# Patient Record
Sex: Male | Born: 1965
Health system: Southern US, Community
[De-identification: ages and names within clinical notes are randomized; demographics above are authoritative.]

## PROBLEM LIST (undated history)

## (undated) DIAGNOSIS — E119 Type 2 diabetes mellitus without complications: Secondary | ICD-10-CM

## (undated) DIAGNOSIS — K219 Gastro-esophageal reflux disease without esophagitis: Secondary | ICD-10-CM

## (undated) DIAGNOSIS — A4902 Methicillin resistant Staphylococcus aureus infection, unspecified site: Secondary | ICD-10-CM

## (undated) DIAGNOSIS — I10 Essential (primary) hypertension: Secondary | ICD-10-CM

## (undated) DIAGNOSIS — Z87898 Personal history of other specified conditions: Secondary | ICD-10-CM

## (undated) HISTORY — PX: BACK SURGERY: SHX140

## (undated) HISTORY — PX: LAPAROSCOPIC GASTRIC BANDING: SHX1100

## (undated) HISTORY — DX: Personal history of other specified conditions: Z87.898

---

## 2015-08-31 ENCOUNTER — Emergency Department (HOSPITAL_COMMUNITY)
Admission: EM | Admit: 2015-08-31 | Discharge: 2015-08-31 | Disposition: A | Discharge status: Home / Self Care | Payer: Self-pay | Attending: Emergency Medicine | Admitting: Emergency Medicine

## 2015-08-31 ENCOUNTER — Encounter (HOSPITAL_COMMUNITY): Payer: Self-pay | Admitting: *Deleted

## 2015-08-31 DIAGNOSIS — K029 Dental caries, unspecified: Secondary | ICD-10-CM | POA: Insufficient documentation

## 2015-08-31 DIAGNOSIS — K0889 Other specified disorders of teeth and supporting structures: Secondary | ICD-10-CM | POA: Insufficient documentation

## 2015-08-31 DIAGNOSIS — K002 Abnormalities of size and form of teeth: Secondary | ICD-10-CM | POA: Insufficient documentation

## 2015-08-31 HISTORY — DX: Essential (primary) hypertension: I10

## 2015-08-31 MED ORDER — PENICILLIN V POTASSIUM 500 MG PO TABS: 500.0000 mg | ORAL_TABLET | Freq: Four times a day (QID) | ORAL | Status: DC

## 2015-08-31 MED ORDER — IBUPROFEN 800 MG PO TABS: 800.0000 mg | ORAL_TABLET | Freq: Three times a day (TID) | ORAL | Status: DC | PRN

## 2015-08-31 MED ORDER — HYDROCODONE-ACETAMINOPHEN 5-325 MG PO TABS: 1.0000 | ORAL_TABLET | Freq: Four times a day (QID) | ORAL | Status: DC | PRN

## 2015-08-31 NOTE — ED Notes (Signed)
Pt awaiting information from Case Manager for further assistance with resources.

## 2015-08-31 NOTE — ED Notes (Signed)
Pt c/o tooth pain stating "it's bothered me a long time"

## 2015-08-31 NOTE — Progress Notes (Signed)
EDCM spoke to patient at bedside. Patient confirms she does not have a pcp or insurance [place].  [facility], informed patient of services there.  EDCM also provided patient with list of pcps who accept self pay patients, list of discount pharmacies and websites needymeds.org and GoodRX.com for medication assistance, phone number to inquire about the orange card, phone number to inquire about Mediciad, phone number to inquire about the Affordable Care Act, financial resources in the community such as local churches, salvation army, urban ministries, and dental assistance for uninsured patients.  Patient thankful for resources.  No further EDCM needs at this time.  Patient agreeable to [facility] referral for orange card.  P4CC referral placed for orange card.

## 2015-08-31 NOTE — ED Notes (Addendum)
Pt provided information for [facility] for b/p f/uas printed by Dagoberto Ligashris, PA-C.

## 2015-08-31 NOTE — Discharge Instructions (Signed)
Return here as needed. Follow up with the dentist provided. °

## 2015-08-31 NOTE — ED Provider Notes (Signed)
CSN: [id number]     Arrival date & time 08/31/15  1959 History  By signing my name below, I, [name], attest that this documentation has been prepared under the direction and in the presence of Ebbie Ridgehris Khadeem Rockett, [employer]PA-C Electronically Signed: [name], ED Scribe. 08/31/2015. 8:31 PM.   Chief Complaint  Patient presents with  . Dental Pain      The history is provided by the patient. No language interpreter was used.    Kevin [name] is a 49 y.o. male who presents to the Emergency Department complaining of right upper dental pain onset 2 days ago worsening last night. He notes that the tooth is primarily gone and that he didn't get any sleep last night. He denies having a dentist at this time. He states that has not tried any medications for the relief for his symptoms. He denies fever, chills, facial swelling, and any other symptoms.    No past medical history on file. No past surgical history on file. No family history on file. Social History  Substance Use Topics  . Smoking status: Not on file  . Smokeless tobacco: Not on file  . Alcohol Use: Not on file    Review of Systems  A complete 10 system review of systems was obtained and all systems are negative except as noted in the HPI and PMH.   Allergies  Review of patient's allergies indicates not on file.  Home Medications   Prior to Admission medications   Not on File   BP 176/118 mmHg  Pulse 123  Temp(Src) 98.7 F (37.1 C) (Oral)  Resp 20  SpO2 95% Physical Exam  Constitutional: He is oriented to person, place, and time. He appears well-developed and well-nourished. No distress.  HENT:  Head: Normocephalic and atraumatic.  Mouth/Throat: Oropharynx is clear and moist and mucous membranes are normal. Abnormal dentition.  severely decayed canine on the right upper with inflammation around the gumline and pain.   Eyes: EOM are normal.  Neck: Neck supple.  Cardiovascular: Normal rate.   Pulmonary/Chest: Effort  normal. No respiratory distress.  Musculoskeletal: Normal range of motion.  Neurological: He is alert and oriented to person, place, and time.  Skin: Skin is warm and dry.  Psychiatric: He has a normal mood and affect. His behavior is normal.  Nursing note and vitals reviewed.   ED Course  Procedures (including critical care time) DIAGNOSTIC STUDIES: Oxygen Saturation is 95% on RA, adequate by my interpretation.    COORDINATION OF CARE: 8:29 PM Discussed treatment plan with pt at bedside which includes abx Rx and referral and f/u with dentist and pt agreed to plan.    I personally performed the services described in this documentation, which was scribed in my presence. The recorded information has been reviewed and is accurate.    Charlestine NightChristopher Aland Chestnutt, PA-C 09/02/15 0551  Laurence Spatesachel Morgan Little, MD 09/07/15 [phone]

## 2015-11-11 ENCOUNTER — Emergency Department (HOSPITAL_COMMUNITY)
Admission: EM | Admit: 2015-11-11 | Discharge: 2015-11-11 | Disposition: A | Discharge status: Home / Self Care | Payer: Self-pay | Attending: Emergency Medicine | Admitting: Emergency Medicine

## 2015-11-11 ENCOUNTER — Encounter (HOSPITAL_COMMUNITY): Payer: Self-pay | Admitting: *Deleted

## 2015-11-11 DIAGNOSIS — Z87891 Personal history of nicotine dependence: Secondary | ICD-10-CM | POA: Insufficient documentation

## 2015-11-11 DIAGNOSIS — K047 Periapical abscess without sinus: Secondary | ICD-10-CM | POA: Insufficient documentation

## 2015-11-11 DIAGNOSIS — I10 Essential (primary) hypertension: Secondary | ICD-10-CM | POA: Insufficient documentation

## 2015-11-11 DIAGNOSIS — K029 Dental caries, unspecified: Secondary | ICD-10-CM | POA: Insufficient documentation

## 2015-11-11 MED ORDER — TRAMADOL HCL 50 MG PO TABS: 50.0000 mg | ORAL_TABLET | Freq: Four times a day (QID) | ORAL | Status: DC | PRN

## 2015-11-11 MED ORDER — PENICILLIN V POTASSIUM 500 MG PO TABS: 500.0000 mg | ORAL_TABLET | Freq: Four times a day (QID) | ORAL | Status: DC

## 2015-11-11 MED ORDER — IBUPROFEN 800 MG PO TABS: 800.0000 mg | ORAL_TABLET | Freq: Three times a day (TID) | ORAL | Status: DC | PRN

## 2015-11-11 NOTE — ED Notes (Signed)
?  Abscess upper rt front of mouth on gum line. Seen in Dec for same, antibiotics helped at that time.

## 2015-11-11 NOTE — ED Provider Notes (Signed)
CSN: [id number]     Arrival date & time 11/11/15  1191 History   First MD Initiated Contact with Patient 11/11/15 [phone]     Chief Complaint  Patient presents with  . Abscess     (Consider location/radiation/quality/duration/timing/severity/associated sxs/prior Treatment) HPI Patient presents to the emergency department with right upper dental pain.  The patient states that he was here in December for similar symptoms.  He states that the pain was improved and gotten better over time after the antibiotics.  He states that he did not follow up with dentistry.  The patient states that nothing seems to make his condition, better or worse.  Patient has severe decay of his teeth.  The patient states he does not have a dentist.  The patient denies chest pain, shortness of breath, weakness, dizziness, headache, blurred vision, back pain, neck swelling, tongue swelling, or syncope.  The patient states that seems make the condition better.  Palpation of the area makes the pain worse Past Medical History  Diagnosis Date  . Hypertension    Past Surgical History  Procedure Laterality Date  . Back surgery     No family history on file. Social History  Substance Use Topics  . Smoking status: Former Games developer  . Smokeless tobacco: None  . Alcohol Use: No    Review of Systems  All other systems negative except as documented in the HPI. All pertinent positives and negatives as reviewed in the HPI.  Allergies  Review of patient's allergies indicates no known allergies.  Home Medications   Prior to Admission medications   Medication Sig Start Date End Date Taking? Authorizing Provider  HYDROcodone-acetaminophen (NORCO/VICODIN) 5-325 MG tablet Take 1 tablet by mouth every 6 (six) hours as needed for moderate pain. Patient not taking: Reported on 11/11/2015 08/31/15   Charlestine Night, PA-C  ibuprofen (ADVIL,MOTRIN) 800 MG tablet Take 1 tablet (800 mg total) by mouth every 8 (eight) hours as  needed. Patient not taking: Reported on 11/11/2015 08/31/15   Charlestine Night, PA-C  penicillin v potassium (VEETID) 500 MG tablet Take 1 tablet (500 mg total) by mouth 4 (four) times daily. Patient not taking: Reported on 11/11/2015 08/31/15   Charlestine Night, PA-C   BP 169/116 mmHg  Pulse 98  Temp(Src) 98.4 F (36.9 C) (Oral)  Resp 18  SpO2 99% Physical Exam  Constitutional: He is oriented to person, place, and time. He appears well-developed and well-nourished.  HENT:  Head: Normocephalic and atraumatic.  Mouth/Throat: Uvula is midline, oropharynx is clear and moist and mucous membranes are normal.    Eyes: Pupils are equal, round, and reactive to light.  Cardiovascular: Normal rate, regular rhythm and normal heart sounds.  Exam reveals no gallop and no friction rub.   No murmur heard. Pulmonary/Chest: Effort normal and breath sounds normal. No respiratory distress.  Neurological: He is alert and oriented to person, place, and time. He exhibits normal muscle tone. Coordination normal.  Skin: Skin is warm and dry. No rash noted. No erythema.  Psychiatric: He has a normal mood and affect. His behavior is normal.  Nursing note and vitals reviewed.   ED Course  Procedures (including critical care time) Labs Review Labs Reviewed - No data to display  Imaging Review No results found. I have personally reviewed and evaluated these images and lab results as part of my medical decision-making.   Patient be given follow-up with oral surgery due to the fact that he has severe decay    Charlestine Night,  PA-C 11/11/15 [phone]  Raeford RazorStephen Kohut, MD 11/13/15 [phone]

## 2015-11-11 NOTE — Discharge Instructions (Signed)
There was no dentist on call, however, did refer you to an oral surgeon.  There are also resources below for dental care and primary care follow-up  [employer] Resource Guide Dental The United Ways 211 is a great source of information about community services available.  Access by dialing 2-1-1 from anywhere in [place], or by website -  PooledIncome.plwww.nc211.org.   Other Local Resources (Updated 09/2015)  Dental  Care   Services    Phone Number and Address  Cost  [facility] For children 590 - 50 years of age:   Cleaning  Tooth brushing/flossing instruction  Sealants, fillings, crowns  Extractions  Emergency treatment  [phone] [address] [place], [place] [address] Charges based on family income.  Medicaid and some insurance plans accepted.     [place] [facility]  Cleaning  Sealants, fillings, crowns  Extractions  Emergency treatment [phone] [address] [place], [place]  Pregnant women 50 years of age or older with a Medicaid card  [place] [facility] [place]  Cleaning  Sealants, fillings, crowns  Extractions  Emergency treatment [phone] [address] [place], [place] Pregnant women 50 years of age or older with a Medicaid card  [facility] For children 920 - 50 years of age:   Cleaning  Tooth brushing/flossing instruction  Sealants, fillings, crowns  Extractions  Emergency treatment Limited orthodontic services for patients with Medicaid [phone] [address] [place], [place] [address] Medicaid and [facility] Health Choice cover for children up to age 50 and pregnant women.  Parents of children up to age 50 without Medicaid pay a reduced fee at time of service.  [facility] [place] For children 720 - 50 years of age:   Cleaning  Tooth  brushing/flossing instruction  Sealants, fillings, crowns  Extractions  Emergency treatment Limited orthodontic services for patients with Medicaid [phone] [address] [place], [place].  Medicaid and [place] Health Choice cover for children up to age 50 and pregnant women.  Parents of children up to age 50 without Medicaid pay a reduced fee.  [facility]  Cleaning  Sealants, fillings, crowns  Extractions  Hours: Tuesdays and Thursdays, 4:15 - 8 pm [phone] [address] [place], [place] [address] Services free of charge to [facility] residents ages 18-64 who do not have health insurance, Medicare, [place], or [place] benefits and fall within federal poverty guidelines  [facility]    Provides dental care in addition to primary medical care, nutritional counseling, and pharmacy:  Nurse, mental healthCleaning  Sealants, fillings, crowns  Extractions                  [phone] [facility], [address] [place], [place]  [phone] [facility], [address] [place], [place]  [phone] [facility] [place], [place]  [phone] [facility], [address] [place], [place]  [phone] [facility] [address] [place], [place] Accepts [place], [place]are, most insurance.  Also provides services available to all with fees adjusted based on ability to pay.    [facility]  Cleaning  Tooth brushing/flossing instruction  Sealants, fillings, crowns  Extractions  Emergency treatment Hours: Tuesdays, Thursdays, and Fridays from 8 am to 5 pm by  appointment only. [phone] [address] [place], [place] [address] [facility] residents with Medicaid (depending on eligibility) and children with [facility] Health Choice - call for more information.  [facility]  Extractions only  Hours: 2nd and 4th Thursday of each month from 6:30 am - 9 am.   [phone]. [phone] [place], [place] [address] Ages 39 and older only.  Patients are seen on a first come, first served basis.  [employer]  [employer]  Extractions  Orthodontics  Endodontics  Implants/Crowns/Bridges  Complete and partial dentures [phone] [place], [place] Patients must complete an application for services.  There is often a waiting list.

## 2016-04-10 ENCOUNTER — Emergency Department (HOSPITAL_COMMUNITY)
Admission: EM | Admit: 2016-04-10 | Discharge: 2016-04-10 | Disposition: A | Discharge status: Home / Self Care | Payer: Self-pay | Attending: Emergency Medicine | Admitting: Emergency Medicine

## 2016-04-10 ENCOUNTER — Encounter (HOSPITAL_COMMUNITY): Payer: Self-pay | Admitting: Emergency Medicine

## 2016-04-10 DIAGNOSIS — L02212 Cutaneous abscess of back [any part, except buttock]: Secondary | ICD-10-CM | POA: Insufficient documentation

## 2016-04-10 DIAGNOSIS — I10 Essential (primary) hypertension: Secondary | ICD-10-CM | POA: Insufficient documentation

## 2016-04-10 DIAGNOSIS — L0291 Cutaneous abscess, unspecified: Secondary | ICD-10-CM

## 2016-04-10 DIAGNOSIS — Z87891 Personal history of nicotine dependence: Secondary | ICD-10-CM | POA: Insufficient documentation

## 2016-04-10 HISTORY — DX: Methicillin resistant Staphylococcus aureus infection, unspecified site: A49.02

## 2016-04-10 MED ORDER — SULFAMETHOXAZOLE-TRIMETHOPRIM 800-160 MG PO TABS: 1.0000 | ORAL_TABLET | Freq: Two times a day (BID) | ORAL | 0 refills | Status: AC

## 2016-04-10 MED ORDER — LIDOCAINE-EPINEPHRINE 2 %-1:100000 IJ SOLN
20.0000 mL | Freq: Once | INTRAMUSCULAR | Status: AC
  Administered 2016-04-10: 1 mL
  Filled 2016-04-10: qty 1

## 2016-04-10 NOTE — ED Notes (Signed)
Pt's blood pressure is elevated but pt states that is not bad for him  Pt states he has hypertension but is not on any medication for it

## 2016-04-10 NOTE — ED Provider Notes (Signed)
[facility]-EMERGENCY DEPT Provider Note   CSN: [id number] Arrival date & time: 04/10/16  8657  First Provider Contact:  First MD Initiated Contact with Patient 04/10/16 [phone]        History   Chief Complaint Chief Complaint  Patient presents with  . Abscess    HPI Kevin [name] is a 50 y.o. male.    Patient presents to the emergency department with complaints of a small abscess to his right low back which he noted yesterday.  He states the area is red and hot and tender to touch.  He states he's had multiple abscesses before.  No fevers or chills.  No other complaints.    The history is provided by the patient and medical records.    Past Medical History:  Diagnosis Date  . Hypertension   . MRSA (methicillin resistant Staphylococcus aureus)     There are no active problems to display for this patient.   Past Surgical History:  Procedure Laterality Date  . BACK SURGERY    . LAPAROSCOPIC GASTRIC BANDING         Home Medications    Prior to Admission medications   Medication Sig Start Date End Date Taking? Authorizing Provider  sulfamethoxazole-trimethoprim (BACTRIM DS,SEPTRA DS) 800-160 MG tablet Take 1 tablet by mouth 2 (two) times daily. 04/10/16 04/17/16  Azalia Bilis, MD    Family History History reviewed. No pertinent family history.  Social History Social History  Substance Use Topics  . Smoking status: Former Games developer  . Smokeless tobacco: Never Used  . Alcohol use No     Allergies   Review of patient's allergies indicates no known allergies.   Review of Systems Review of Systems  All other systems reviewed and are negative.    Physical Exam Updated Vital Signs BP (!) 192/120 (BP Location: Left Arm)   Pulse 111   Temp 98 F (36.7 C) (Oral)   Resp 20   Ht 5\' 7"  (1.702 m)   Wt 180 lb (81.6 kg)   SpO2 100%   BMI 28.19 kg/m   Physical Exam  Constitutional: He is oriented to person, place, and time. He appears well-developed and well-nourished.   HENT:  Head: Normocephalic.  Eyes: EOM are normal.  Neck: Normal range of motion.  Pulmonary/Chest: Effort normal.  Abdominal: He exhibits no distension.  Musculoskeletal: Normal range of motion.  Small focal abscess to right low back without significant spreading erythema.  No drainage.  Small fluctuance.  Neurological: He is alert and oriented to person, place, and time.  Psychiatric: He has a normal mood and affect.  Nursing note and vitals reviewed.    ED Treatments / Results  Labs (all labs ordered are listed, but only abnormal results are displayed) Labs Reviewed - No data to display  EKG  EKG Interpretation None       Radiology No results found.  Procedures Procedures (including critical care time)   ++++++++++++++++++++++++++++++++++++++++++++++++++++++  INCISION AND DRAINAGE Performed by: Lyanne Co Consent: Verbal consent obtained. Risks and benefits: risks, benefits and alternatives were discussed Time out performed prior to procedure Type: abscess Body area: Right low back Anesthesia: local infiltration Incision was made with a scalpel. Local anesthetic: lidocaine 2 % with  epinephrine Anesthetic total: 3 ml Complexity: complex Blunt dissection to break up loculations Drainage: purulent Drainage amount: Small  Packing material: None  Patient tolerance: Patient tolerated the procedure well with no immediate complications.     Medications Ordered in ED Medications  lidocaine-EPINEPHrine (XYLOCAINE  W/EPI) 2 %-1:100000 (with pres) injection 20 mL (not administered)     Initial Impression / Assessment and Plan / ED Course  I have reviewed the triage vital signs and the nursing notes.  Pertinent labs & imaging results that were available during my care of the patient were reviewed by me and considered in my medical decision making (see chart for details).  Clinical Course    Summary abscess.  I and D.  Bactrim.  Patient understands  return the ER for new or worsening symptoms  Final Clinical Impressions(s) / ED Diagnoses   Final diagnoses:  Abscess    New Prescriptions New Prescriptions   SULFAMETHOXAZOLE-TRIMETHOPRIM (BACTRIM DS,SEPTRA DS) 800-160 MG TABLET    Take 1 tablet by mouth 2 (two) times daily.     Azalia Bilis, MD 04/10/16 [phone]

## 2016-04-10 NOTE — ED Notes (Signed)
Pt left the department by self with no medication reaction noted.

## 2016-04-10 NOTE — ED Triage Notes (Signed)
Pt has an abscess on his lower back on the right side  Pt states he has MRSA and has had many of these in the past  Pt states is started yesterday  Area is red and hot to touch

## 2016-05-13 ENCOUNTER — Encounter (HOSPITAL_COMMUNITY): Payer: Self-pay | Admitting: Emergency Medicine

## 2016-05-13 ENCOUNTER — Emergency Department (HOSPITAL_COMMUNITY)
Admission: EM | Admit: 2016-05-13 | Discharge: 2016-05-13 | Disposition: A | Discharge status: Home / Self Care | Payer: Self-pay | Attending: Emergency Medicine | Admitting: Emergency Medicine

## 2016-05-13 DIAGNOSIS — L02416 Cutaneous abscess of left lower limb: Secondary | ICD-10-CM | POA: Insufficient documentation

## 2016-05-13 DIAGNOSIS — L0291 Cutaneous abscess, unspecified: Secondary | ICD-10-CM

## 2016-05-13 DIAGNOSIS — I10 Essential (primary) hypertension: Secondary | ICD-10-CM | POA: Insufficient documentation

## 2016-05-13 DIAGNOSIS — L03116 Cellulitis of left lower limb: Secondary | ICD-10-CM | POA: Insufficient documentation

## 2016-05-13 DIAGNOSIS — L0390 Cellulitis, unspecified: Secondary | ICD-10-CM

## 2016-05-13 DIAGNOSIS — Z87891 Personal history of nicotine dependence: Secondary | ICD-10-CM | POA: Insufficient documentation

## 2016-05-13 MED ORDER — IBUPROFEN 800 MG PO TABS: 800.0000 mg | ORAL_TABLET | Freq: Three times a day (TID) | ORAL | 0 refills | Status: DC | PRN

## 2016-05-13 MED ORDER — LIDOCAINE HCL 2 % IJ SOLN
INTRAMUSCULAR | Status: AC
  Filled 2016-05-13: qty 20

## 2016-05-13 MED ORDER — CLINDAMYCIN HCL 150 MG PO CAPS: 450.0000 mg | ORAL_CAPSULE | Freq: Three times a day (TID) | ORAL | 0 refills | Status: DC

## 2016-05-13 NOTE — ED Provider Notes (Signed)
[facility] Provider Note   CSN: [id number] Arrival date & time: 05/13/16  0532     History   Chief Complaint Chief Complaint  Patient presents with  . Abscess    HPI Kevin [name] is a 50 y.o. male.  HPI Patient presents to the emergency department with abscess with surrounding redness that started 3 days ago.  The patient states that he has had this multiple times in the past.  He states that he started on a course of Bactrim 2 days ago.  States he did not fill it.  The area was improving.  The patient states that he did not take any medications prior to arrival for the discomfort.  The patient states that he has a history of MRSA.  Patient denies fever, nausea, vomiting, weakness, dizziness, headache, blurred vision, back pain, incontinence, dysuria, near syncope or syncope.  Past Medical History:  Diagnosis Date  . Hypertension   . MRSA (methicillin resistant Staphylococcus aureus)     There are no active problems to display for this patient.   Past Surgical History:  Procedure Laterality Date  . BACK SURGERY    . LAPAROSCOPIC GASTRIC BANDING         Home Medications    Prior to Admission medications   Not on File    Family History No family history on file.  Social History Social History  Substance Use Topics  . Smoking status: Former Games developermoker  . Smokeless tobacco: Never Used  . Alcohol use No     Allergies   Review of patient's allergies indicates no known allergies.   Review of Systems Review of Systems All other systems negative except as documented in the HPI. All pertinent positives and negatives as reviewed in the HPI.  Physical Exam Updated Vital Signs BP (!) 185/121 (BP Location: Left Arm)   Pulse 118   Temp 98.6 F (37 C) (Oral)   Resp 18   SpO2 99%   Physical Exam  Constitutional: He appears well-developed and well-nourished. No distress.  HENT:  Head: Normocephalic and atraumatic.  Pulmonary/Chest: Effort normal.  Skin:  Skin is warm and dry.        ED Treatments / Results  Labs (all labs ordered are listed, but only abnormal results are displayed) Labs Reviewed - No data to display  EKG  EKG Interpretation None       Radiology No results found.  Procedures Procedures (including critical care time)  Medications Ordered in ED Medications  lidocaine (XYLOCAINE) 2 % (with pres) injection (not administered)     Initial Impression / Assessment and Plan / ED Course  I have reviewed the triage vital signs and the nursing notes.  Pertinent labs & imaging results that were available during my care of the patient were reviewed by me and considered in my medical decision making (see chart for details).  Clinical Course    INCISION AND DRAINAGE Performed by: Carlyle DollyLAWYER,Anara Cowman W Consent: Verbal consent obtained. Risks and benefits: risks, benefits and alternatives were discussed Type: abscess  Body area: Left proximal lower leg laterally in the fibular head region just below the knee joint  Anesthesia: local infiltration  Incision was made with a scalpel.  Local anesthetic: lidocaine 2 % without epinephrine  Anesthetic total: 5 ml  Complexity: complex Blunt dissection to break up loculations  Drainage: purulent  Drainage amount: Small   Packing material: 1/4 in iodoform gauze  Patient tolerance: Patient tolerated the procedure well with no immediate complications.  Advised patient that he will need follow-up in 2 days.  Advised him to come back here at [facility] urgent care.  Patient be started on clindamycin.  Told to return in the interim for any worsening in his condition.  Advised him to use warm compresses around the area.  Told to keep the area around the packing clean and dry.  Also advised to cover the area as there may be further drainage  Final Clinical Impressions(s) / ED Diagnoses   Final diagnoses:  None    New Prescriptions New Prescriptions   No  medications on file     Charlestine Night, PA-C 05/13/16 0825    Lorre Nick, MD 05/14/16 [phone]

## 2016-05-13 NOTE — ED Notes (Signed)
Report given to Tim,RN.

## 2016-05-13 NOTE — Discharge Instructions (Signed)
Return here as needed for any worsening in your condition.  [facility] or this emergency room in 2 days for recheck and packing removal.  Use warm compresses around the area

## 2016-05-13 NOTE — ED Triage Notes (Signed)
Pt presents with abscess to right lower extremity.  Positive history of recurrent MRSA x 5 years. Pt states he is on Bactrim currently as he had some left over from previous diagnoses.

## 2016-06-06 ENCOUNTER — Emergency Department (HOSPITAL_COMMUNITY)
Admission: EM | Admit: 2016-06-06 | Discharge: 2016-06-06 | Disposition: A | Discharge status: Home / Self Care | Payer: Self-pay | Attending: Emergency Medicine | Admitting: Emergency Medicine

## 2016-06-06 ENCOUNTER — Emergency Department (HOSPITAL_BASED_OUTPATIENT_CLINIC_OR_DEPARTMENT_OTHER)
Admit: 2016-06-06 | Discharge: 2016-06-06 | Disposition: A | Discharge status: Home / Self Care | Payer: Self-pay | Attending: Emergency Medicine | Admitting: Emergency Medicine

## 2016-06-06 ENCOUNTER — Emergency Department (HOSPITAL_COMMUNITY): Payer: Self-pay

## 2016-06-06 ENCOUNTER — Encounter (HOSPITAL_COMMUNITY): Payer: Self-pay | Admitting: *Deleted

## 2016-06-06 DIAGNOSIS — M25561 Pain in right knee: Secondary | ICD-10-CM

## 2016-06-06 DIAGNOSIS — Z87891 Personal history of nicotine dependence: Secondary | ICD-10-CM | POA: Insufficient documentation

## 2016-06-06 DIAGNOSIS — IMO0001 Reserved for inherently not codable concepts without codable children: Secondary | ICD-10-CM

## 2016-06-06 DIAGNOSIS — R739 Hyperglycemia, unspecified: Secondary | ICD-10-CM

## 2016-06-06 DIAGNOSIS — Z86718 Personal history of other venous thrombosis and embolism: Secondary | ICD-10-CM

## 2016-06-06 DIAGNOSIS — Z5181 Encounter for therapeutic drug level monitoring: Secondary | ICD-10-CM | POA: Insufficient documentation

## 2016-06-06 DIAGNOSIS — M79609 Pain in unspecified limb: Secondary | ICD-10-CM

## 2016-06-06 DIAGNOSIS — R030 Elevated blood-pressure reading, without diagnosis of hypertension: Secondary | ICD-10-CM

## 2016-06-06 DIAGNOSIS — Z79899 Other long term (current) drug therapy: Secondary | ICD-10-CM | POA: Insufficient documentation

## 2016-06-06 DIAGNOSIS — I10 Essential (primary) hypertension: Secondary | ICD-10-CM | POA: Insufficient documentation

## 2016-06-06 LAB — BASIC METABOLIC PANEL
Anion gap: 10 (ref 5–15)
BUN: 11 mg/dL (ref 6–20)
CALCIUM: 9.1 mg/dL (ref 8.9–10.3)
CHLORIDE: 101 mmol/L (ref 101–111)
CO2: 23 mmol/L (ref 22–32)
CREATININE: 0.79 mg/dL (ref 0.61–1.24)
GFR calc non Af Amer: >60 mL/min (ref >60)
Glucose, Bld: 353 mg/dL — ABNORMAL HIGH (ref 65–99)
Potassium: 3.8 mmol/L (ref 3.5–5.1)
SODIUM: 134 mmol/L — AB (ref 135–145)

## 2016-06-06 LAB — CBC WITH DIFFERENTIAL/PLATELET
BASOS PCT: 1 %
Basophils Absolute: 0.1 10*3/uL (ref 0.0–0.1)
EOS ABS: 0.1 10*3/uL (ref 0.0–0.7)
EOS PCT: 2 %
HCT: 40.1 % (ref 39.0–52.0)
Hemoglobin: 14 g/dL (ref 13.0–17.0)
LYMPHS ABS: 1.9 10*3/uL (ref 0.7–4.0)
Lymphocytes Relative: 33 %
MCH: 31.3 pg (ref 26.0–34.0)
MCHC: 34.9 g/dL (ref 30.0–36.0)
MCV: 89.5 fL (ref 78.0–100.0)
Monocytes Absolute: 0.3 10*3/uL (ref 0.1–1.0)
Monocytes Relative: 5 %
NEUTROS PCT: 59 %
Neutro Abs: 3.3 10*3/uL (ref 1.7–7.7)
PLATELETS: 222 10*3/uL (ref 150–400)
RBC: 4.48 MIL/uL (ref 4.22–5.81)
RDW: 12.3 % (ref 11.5–15.5)
WBC: 5.7 10*3/uL (ref 4.0–10.5)

## 2016-06-06 LAB — PROTIME-INR
INR: 0.96
PROTHROMBIN TIME: 12.8 s (ref 11.4–15.2)

## 2016-06-06 NOTE — Discharge Instructions (Signed)
Read the information below.  US of your right leg did not show any acute blood clot. Your knee x-ray was re-assuring.  Your labs are remarkable for mildly elevated blood sugar. It is important that you follow up with a primary doctor for further evaluation.  Your blood pressure is also elevated - it is important to again talk with a primary doctor regarding your pressure.  You can take tylenol or motrin for pain relief.  I have provided the contact information for [facility], please call to establish a primary doctor.  You may return to the Emergency Department at any time for worsening condition or any new symptoms that concern you. Return to ED if you develop chest pain, shortness of breath, lower leg swelling, swelling/redness/warmth/unable to bend knee, or fever.

## 2016-06-06 NOTE — ED Provider Notes (Signed)
[facility]-EMERGENCY DEPT Provider Note   CSN: [id number] Arrival date & time: 06/06/16  0340     History   Chief Complaint Chief Complaint  Patient presents with  . Claudication    HPI Kevin [name] is a 50 y.o. male.  Kevin [name] is a 50 y.o. male with h/o HTN, MRSA infection, and right DVT presents to ED with right posterior knee pain. Pt states he has a history of a right DVT years ago, was treated with lovenox, he is not currently on any anti-coagulation therapy. He is concerned about possible DVT behind his right knee as the pain is "similar to when I had the blood clot." Pt first noticed the pain on Saturday, he got up and had a "cramping, straining" feeling in the posterior aspect of his right knee. He reports pain eased on Sunday and Monday; however, on Tuesday pain was worse. Walking/movement makes the pain worse, rest makes the pain better. Denies redness, swelling, or warmth, SOB, hemoptysis, chest pain, fever, recent long distance travel/surgery/hospitalization, h/o cancer. No recent trauma to knee.       Past Medical History:  Diagnosis Date  . Hypertension   . MRSA (methicillin resistant Staphylococcus aureus)     There are no active problems to display for this patient.   Past Surgical History:  Procedure Laterality Date  . BACK SURGERY    . LAPAROSCOPIC GASTRIC BANDING         Home Medications    Prior to Admission medications   Medication Sig Start Date End Date Taking? Authorizing Provider  acetaminophen (TYLENOL) 500 MG tablet Take 1,000 mg by mouth every 6 (six) hours as needed for mild pain.   Yes Historical Provider, MD  omeprazole (PRILOSEC) 40 MG capsule Take 40 mg by mouth daily.   Yes Historical Provider, MD  clindamycin (CLEOCIN) 150 MG capsule Take 3 capsules (450 mg total) by mouth 3 (three) times daily. Patient not taking: Reported on 06/06/2016 05/13/16   Charlestine Nighthristopher Lawyer, PA-C  ibuprofen (ADVIL,MOTRIN) 800 MG tablet Take 1 tablet (800 mg  total) by mouth every 8 (eight) hours as needed. Patient not taking: Reported on 06/06/2016 05/13/16   Charlestine Nighthristopher Lawyer, PA-C    Family History No family history on file.  Social History Social History  Substance Use Topics  . Smoking status: Former Games developermoker  . Smokeless tobacco: Never Used  . Alcohol use No     Allergies   Review of patient's allergies indicates no known allergies.   Review of Systems Review of Systems  Constitutional: Negative for fever.  HENT: Negative for trouble swallowing.   Eyes: Negative for visual disturbance.  Respiratory: Negative for shortness of breath.   Cardiovascular: Negative for chest pain.  Gastrointestinal: Negative for abdominal pain, nausea and vomiting.  Genitourinary: Negative for dysuria and hematuria.  Musculoskeletal: Positive for arthralgias.  Skin: Negative for color change.  Neurological: Negative for syncope, weakness and numbness.     Physical Exam Updated Vital Signs BP 152/98   Pulse 80   Temp 98.2 F (36.8 C) (Oral)   Resp 17   Ht 5\' 7"  (1.702 m)   Wt 83.9 kg   SpO2 94%   BMI 28.98 kg/m   Physical Exam  Constitutional: He appears well-developed and well-nourished. No distress.  HENT:  Head: Normocephalic and atraumatic.  Eyes: Conjunctivae are normal. Right eye exhibits no discharge. Left eye exhibits no discharge. No scleral icterus.  Neck: Normal range of motion and phonation normal. No neck rigidity. Normal  range of motion present.  Cardiovascular: Normal rate, regular rhythm, normal heart sounds and intact distal pulses.   No murmur heard. 2+ PT pulses  Pulmonary/Chest: Effort normal and breath sounds normal. No stridor. No respiratory distress. He has no wheezes. He has no rales.  Abdominal: Soft. Bowel sounds are normal. He exhibits no distension. There is no tenderness. There is no rigidity, no rebound, no guarding and no CVA tenderness.  Musculoskeletal: Normal range of motion.  Right knee: No  palpable or pulsatile mass to posterior knee. No warmth, erythema, or swelling noted. ROM intact. Pt ambulatory. Sensation intact. 2+PT pulses.  Mild TTP of posterior right calf. Some "discomfort" on homan's exam of right lower extremity.   Neurological: He is alert. He is not disoriented. Coordination and gait normal. GCS eye subscore is 4. GCS verbal subscore is 5. GCS motor subscore is 6.  Skin: Skin is warm and dry. He is not diaphoretic.  Psychiatric: He has a normal mood and affect. His behavior is normal.     ED Treatments / Results  Labs (all labs ordered are listed, but only abnormal results are displayed) Labs Reviewed  BASIC METABOLIC PANEL - Abnormal; Notable for the following:       Result Value   Sodium 134 (*)    Glucose, Bld 353 (*)    All other components within normal limits  CBC WITH DIFFERENTIAL/PLATELET  PROTIME-INR    EKG  EKG Interpretation None       Radiology Dg Knee Complete 4 Views Right  Result Date: 06/06/2016 CLINICAL DATA:  Worsening right posterior knee pain since. No known trauma. EXAM: RIGHT KNEE - COMPLETE 4+ VIEW COMPARISON:  None. FINDINGS: No evidence of fracture, dislocation, or joint effusion. No evidence of arthropathy or other focal bone abnormality. Soft tissues are unremarkable. IMPRESSION: Negative. Electronically Signed   By: Elige Ko   On: 06/06/2016 10:07    Procedures Procedures (including critical care time)  Medications Ordered in ED Medications - No data to display   Initial Impression / Assessment and Plan / ED Course  I have reviewed the triage vital signs and the nursing notes.  Pertinent labs & imaging results that were available during my care of the patient were reviewed by me and considered in my medical decision making (see chart for details).  Clinical Course  Value Comment By Time  DG Knee Complete 4 Views Right Negative for fracture or dislocation.  Lona Kettle, [place] 09/27 1043    Patient  presents to ED with complaint of right posterior knee pain. Patient is afebrile and non-toxic appearing in NAD. VSS. Physical exam remarkable for mild TTP of posterior calf and "discomfort" reported on homan's exam. No erythema, warmth, or swelling appreciated. Brisk 2+ PT pulses. Pt ambulatory. Given h/o DVT will check basic labs and venous lower extremity study.   Labs remarkable for hyperglycemia without AG. CBC nml. PT/INR nml. LE venous study negative for DVT or baker's cyst. X-ray of knee negative for acute abnormality. Unclear cause of pain - ?MSK. Symptomatic management given. Discussed results and plan with pt. Discussed hyperglycemia and HTN and encouraged establishment and follow up with PCP. Return precautions discussed. Pt voiced understanding and is agreeable.    Final Clinical Impressions(s) / ED Diagnoses   Final diagnoses:  Right knee pain  Hyperglycemia  Elevated blood pressure    New Prescriptions New Prescriptions   No medications on file     Lona Kettle, [place] 06/06/16 1046  Dione Booze, MD 06/07/16 [phone]

## 2016-06-06 NOTE — ED Notes (Signed)
US at bedside

## 2016-06-06 NOTE — ED Notes (Signed)
Pt ambulatory to restroom

## 2016-06-06 NOTE — ED Triage Notes (Signed)
Pt states that he woke up with leg pain on Sat morning; pt states that he has a hx of DVT and that this feels similar; pt states that the pain is to the back of his left knee; pain is made worse upon walking and applying pressure to his leg; no swelling or redness noted to leg

## 2016-06-06 NOTE — Progress Notes (Signed)
*  Preliminary Results* Right lower extremity venous duplex completed. Laterality was reviewed: patient stated he had right posterior knee pain. Right lower extremity is negative for deep vein thrombosis. There is no evidence of right Baker's cyst.  06/06/2016 8:48 AM  Gertie FeyMichelle Hendryx Ricke, BS, RVT, RDCS, RDMS

## 2016-06-06 NOTE — ED Notes (Addendum)
Pt not in room when this writer entered to discharge him.  Arm band found laying in the bed on top of his gown.  It is assumed that he left.    It is noted that the provider had already provided d/c information.

## 2016-07-24 ENCOUNTER — Encounter (HOSPITAL_COMMUNITY): Payer: Self-pay

## 2016-07-24 ENCOUNTER — Emergency Department (HOSPITAL_COMMUNITY)
Admission: EM | Admit: 2016-07-24 | Discharge: 2016-07-24 | Disposition: A | Discharge status: Home / Self Care | Payer: Self-pay | Attending: Emergency Medicine | Admitting: Emergency Medicine

## 2016-07-24 DIAGNOSIS — L0231 Cutaneous abscess of buttock: Secondary | ICD-10-CM | POA: Insufficient documentation

## 2016-07-24 DIAGNOSIS — Z87891 Personal history of nicotine dependence: Secondary | ICD-10-CM | POA: Insufficient documentation

## 2016-07-24 DIAGNOSIS — I10 Essential (primary) hypertension: Secondary | ICD-10-CM | POA: Insufficient documentation

## 2016-07-24 DIAGNOSIS — L03317 Cellulitis of buttock: Secondary | ICD-10-CM

## 2016-07-24 MED ORDER — CHLORHEXIDINE GLUCONATE 4 % EX LIQD: Freq: Every day | CUTANEOUS | 0 refills | Status: DC | PRN

## 2016-07-24 MED ORDER — CLINDAMYCIN HCL 300 MG PO CAPS
300.0000 mg | ORAL_CAPSULE | Freq: Once | ORAL | Status: AC
  Administered 2016-07-24: 300 mg via ORAL
  Filled 2016-07-24: qty 1

## 2016-07-24 MED ORDER — HYDROCODONE-ACETAMINOPHEN 5-325 MG PO TABS
1.0000 | ORAL_TABLET | Freq: Once | ORAL | Status: AC
  Administered 2016-07-24: 1 via ORAL
  Filled 2016-07-24: qty 1

## 2016-07-24 MED ORDER — LIDOCAINE-EPINEPHRINE (PF) 2 %-1:200000 IJ SOLN
10.0000 mL | Freq: Once | INTRAMUSCULAR | Status: AC
  Administered 2016-07-24: 10 mL via INTRADERMAL
  Filled 2016-07-24: qty 20

## 2016-07-24 MED ORDER — CLINDAMYCIN HCL 150 MG PO CAPS: 300.0000 mg | ORAL_CAPSULE | Freq: Four times a day (QID) | ORAL | 0 refills | Status: AC

## 2016-07-24 NOTE — ED Triage Notes (Signed)
Pt complains of an abcess on his right buttaock for two days, he states that it's very sore and this is his 23rd since 2014

## 2016-07-24 NOTE — ED Provider Notes (Addendum)
[facility]PT Provider Note   CSN: [id number] Arrival date & time: 07/24/16  1324     History   Chief Complaint Chief Complaint  Patient presents with  . Recurrent Skin Infections    HPI Kevin [name] is a 50 y.o. male.  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 Pt states that since 2014, he has had recurrent gluteal/leg abscesses. He has known MRSA. It intermittently responds to Bactrim, but he has been taking Bactrim BID x 5 doses without improvement. Denies any drainage. He states that for the past 4 days, he has noticed red, painful swelling of right buttocks. No fever, chills, nausea, vomiting. Pain is aching and throbbing. Pain made worse with sitting and palpation. Denies any alleviating factors.  Past Medical History:  Diagnosis Date  . Hypertension   . MRSA (methicillin resistant Staphylococcus aureus)     There are no active problems to display for this patient.   Past Surgical History:  Procedure Laterality Date  . BACK SURGERY    . LAPAROSCOPIC GASTRIC BANDING         Home Medications    Prior to Admission medications   Medication Sig Start Date End Date Taking? Authorizing Provider  acetaminophen (TYLENOL) 500 MG tablet Take 1,000 mg by mouth every 6 (six) hours as needed for mild pain.   Yes Historical Provider, MD  omeprazole (PRILOSEC) 40 MG capsule Take 40 mg by mouth daily.   Yes Historical Provider, MD  chlorhexidine (HIBICLENS) 4 % external liquid Apply topically daily as needed. 07/24/16   Shaune Pollack, MD  clindamycin (CLEOCIN) 150 MG capsule Take 2 capsules (300 mg total) by mouth 4 (four) times daily. 07/24/16 07/31/16  Shaune Pollack, MD  ibuprofen (ADVIL,MOTRIN) 800 MG tablet Take 1 tablet (800 mg total) by mouth every 8 (eight) hours as needed. Patient not taking: Reported on 07/24/2016 05/13/16   Charlestine Night, PA-C    Family History History reviewed. No pertinent family  history.  Social History Social History  Substance Use Topics  . Smoking status: Former Games developer  . Smokeless tobacco: Never Used  . Alcohol use No     Allergies   Patient has no known allergies.   Review of Systems Review of Systems  Constitutional: Negative for chills, fatigue and fever.  HENT: Negative for congestion and rhinorrhea.   Eyes: Negative for visual disturbance.  Respiratory: Negative for cough, shortness of breath and wheezing.   Cardiovascular: Negative for chest pain and leg swelling.  Gastrointestinal: Negative for abdominal pain, diarrhea, nausea and vomiting.  Genitourinary: Negative for dysuria and flank pain.  Musculoskeletal: Negative for neck pain and neck stiffness.  Skin: Positive for rash and wound.  Allergic/Immunologic: Negative for immunocompromised state.  Neurological: Negative for syncope, weakness and headaches.  All other systems reviewed and are negative.    Physical Exam Updated Vital Signs BP (!) 132/108 (BP Location: Right Arm)   Pulse 116   Temp 98 F (36.7 C) (Oral)   Resp 18   SpO2 97%   Physical Exam  Constitutional: He is oriented to person, place, and time. He appears well-developed and well-nourished. No distress.  HENT:  Head: Normocephalic and atraumatic.  Eyes: Conjunctivae are normal.  Neck: Neck supple.  Cardiovascular: Normal rate, regular rhythm and normal heart sounds.  Exam reveals no friction rub.   No murmur heard. Pulmonary/Chest: Effort normal and breath sounds normal. No respiratory distress. He has no wheezes. He has  no rales.  Abdominal: He exhibits no distension.  Musculoskeletal: He exhibits no edema.  Neurological: He is alert and oriented to person, place, and time. He exhibits normal muscle tone.  Skin: Skin is warm. Capillary refill takes less than 2 seconds.     Psychiatric: He has a normal mood and affect.  Nursing note and vitals reviewed.    ED Treatments / Results  Labs (all labs  ordered are listed, but only abnormal results are displayed) Labs Reviewed - No data to display  EKG  EKG Interpretation None       Radiology No results found.  Procedures .Marland Kitchen.Incision and Drainage Date/Time: 07/24/2016 5:26 PM Performed by: Shaune PollackISAACS, Alin Hutchins Authorized by: Shaune PollackISAACS, Mehlani Blankenburg   Consent:    Consent obtained:  Verbal   Consent given by:  Patient   Risks discussed:  Incomplete drainage, bleeding, damage to other organs, infection and pain   Alternatives discussed:  Delayed treatment and alternative treatment Location:    Type:  Abscess   Size:  2x2   Location:  Lower extremity   Lower extremity location:  Buttock Pre-procedure details:    Skin preparation:  Antiseptic wash and Chloraprep Anesthesia (see MAR for exact dosages):    Anesthesia method:  Local infiltration   Local anesthetic:  Lidocaine 2% WITH epi Procedure type:    Complexity:  Simple Procedure details:    Needle aspiration: no     Incision types:  Single straight (Single straight to large abscess, stab incision to superior and lateral follicular area)   Incision depth:  Dermal   Scalpel blade:  11   Wound management:  Probed and deloculated and irrigated with saline   Drainage:  Purulent and serosanguinous   Drainage amount:  Moderate   Wound treatment:  Wound left open   Packing materials:  1/4 in iodoform gauze Post-procedure details:    Patient tolerance of procedure:  Tolerated well, no immediate complications   (including critical care time)  Medications Ordered in ED Medications  clindamycin (CLEOCIN) capsule 300 mg (300 mg Oral Given 07/24/16 0817)  HYDROcodone-acetaminophen (NORCO/VICODIN) 5-325 MG per tablet 1 tablet (1 tablet Oral Given 07/24/16 0817)  lidocaine-EPINEPHrine (XYLOCAINE W/EPI) 2 %-1:200000 (PF) injection 10 mL (10 mLs Intradermal Given 07/24/16 0818)     Initial Impression / Assessment and Plan / ED Course  I have reviewed the triage vital signs and the nursing  notes.  Pertinent labs & imaging results that were available during my care of the patient were reviewed by me and considered in my medical decision making (see chart for details).  Clinical Course     50 year old male with past medical history of recurrent MRSA abscesses here with right gluteal abscess and cellulitis. Exam is as above. Patient has mild cellulitis surrounding small abscess. There is no fluctuance, tenderness beyond areas of erythema, or evidence of deep or necrotizing infection. Patient tachycardic on arrival but is notably anxious and hypertensive, and I suspect this is due to pain and anxiety. He has no fevers and is adamant that he has had no nausea, vomiting, and is very well versed with signs of systemic illness, as he has been hospitalized previously. He denies any signs of illness at this time beyond pain at the site of his redness. Patient subsequent consented and incision and drainage performed as above. Will transition the patient to clindamycin as assessed work very well for him recently. I discussed that given his possible failure of outpatient Bactrim, he is at risk for treatment  failure but he would like to treat as an outpatient at this time. Given his well appearance, familiarity with abscesses and cellulitis, with absence of immune suppression, feel this is reasonable. Advised 48 hour wound check and repeat exam.  Final Clinical Impressions(s) / ED Diagnoses   Final diagnoses:  Cellulitis and abscess of buttock    New Prescriptions Discharge Medication List as of 07/24/2016  9:36 AM    START taking these medications   Details  chlorhexidine (HIBICLENS) 4 % external liquid Apply topically daily as needed., Starting Tue 07/24/2016, Print         Shaune Pollackameron Rebbecca Osuna, MD 07/24/16 1729    Shaune Pollackameron Sila Sarsfield, MD 07/24/16 1730

## 2016-09-19 ENCOUNTER — Emergency Department (HOSPITAL_COMMUNITY)
Admission: EM | Admit: 2016-09-19 | Discharge: 2016-09-19 | Disposition: A | Discharge status: Home / Self Care | Payer: Self-pay | Attending: Emergency Medicine | Admitting: Emergency Medicine

## 2016-09-19 ENCOUNTER — Encounter (HOSPITAL_COMMUNITY): Payer: Self-pay

## 2016-09-19 DIAGNOSIS — I10 Essential (primary) hypertension: Secondary | ICD-10-CM | POA: Insufficient documentation

## 2016-09-19 DIAGNOSIS — L089 Local infection of the skin and subcutaneous tissue, unspecified: Secondary | ICD-10-CM | POA: Insufficient documentation

## 2016-09-19 DIAGNOSIS — Z87891 Personal history of nicotine dependence: Secondary | ICD-10-CM | POA: Insufficient documentation

## 2016-09-19 DIAGNOSIS — Z79899 Other long term (current) drug therapy: Secondary | ICD-10-CM | POA: Insufficient documentation

## 2016-09-19 MED ORDER — DOXYCYCLINE HYCLATE 100 MG PO CAPS: 100.0000 mg | ORAL_CAPSULE | Freq: Two times a day (BID) | ORAL | 0 refills | Status: DC

## 2016-09-19 NOTE — ED Provider Notes (Signed)
[facility] DEPT Provider Note   CSN: [id number] Arrival date & time: 09/19/16  0607     History   Chief Complaint Chief Complaint  Patient presents with  . Recurrent Skin Infections    HPI Kevin [name] is a 51 y.o. male.  Patient presents to the ED with a chief complaint of abscess.  Patient states that he get recurrent abscess around his buttocks.  He states that this is his 25th episode.  States that most times he is able to catch the symptoms early and has been able to treat it with antibiotics alone.  He states that it has only been drained a couple of times.  He denies any fevers, chills, nausea, vomiting, or diarrhea.  States that the symptoms first started yesterday.   The history is provided by the patient. No language interpreter was used.    Past Medical History:  Diagnosis Date  . Hypertension   . MRSA (methicillin resistant Staphylococcus aureus)     There are no active problems to display for this patient.   Past Surgical History:  Procedure Laterality Date  . BACK SURGERY    . LAPAROSCOPIC GASTRIC BANDING         Home Medications    Prior to Admission medications   Medication Sig Start Date End Date Taking? Authorizing Provider  acetaminophen (TYLENOL) 500 MG tablet Take 1,000 mg by mouth every 6 (six) hours as needed for mild pain.    Historical Provider, MD  chlorhexidine (HIBICLENS) 4 % external liquid Apply topically daily as needed. 07/24/16   Shaune Pollack, MD  doxycycline (VIBRAMYCIN) 100 MG capsule Take 1 capsule (100 mg total) by mouth 2 (two) times daily. 09/19/16   Roxy Horseman, PA-C  ibuprofen (ADVIL,MOTRIN) 800 MG tablet Take 1 tablet (800 mg total) by mouth every 8 (eight) hours as needed. Patient not taking: Reported on 07/24/2016 05/13/16   Charlestine Night, PA-C  omeprazole (PRILOSEC) 40 MG capsule Take 40 mg by mouth daily.    Historical Provider, MD    Family History History reviewed. No pertinent family history.  Social  History Social History  Substance Use Topics  . Smoking status: Former Games developer  . Smokeless tobacco: Never Used  . Alcohol use No     Allergies   Patient has no known allergies.   Review of Systems Review of Systems  All other systems reviewed and are negative.    Physical Exam Updated Vital Signs BP (!) 156/120 (BP Location: Left Arm)   Pulse 115   Temp 97.5 F (36.4 C) (Oral)   Resp 18   Ht 5\' 7"  (1.702 m)   Wt 79.4 kg   SpO2 97%   BMI 27.41 kg/m   Physical Exam  Constitutional: He is oriented to person, place, and time. He appears well-developed and well-nourished.  HENT:  Head: Normocephalic and atraumatic.  Eyes: Conjunctivae and EOM are normal. Pupils are equal, round, and reactive to light. Right eye exhibits no discharge. Left eye exhibits no discharge. No scleral icterus.  Neck: Normal range of motion. Neck supple. No JVD present.  Cardiovascular: Normal rate, regular rhythm and normal heart sounds.  Exam reveals no gallop and no friction rub.   No murmur heard. Pulmonary/Chest: Effort normal and breath sounds normal. No respiratory distress. He has no wheezes. He has no rales. He exhibits no tenderness.  Abdominal: Soft. He exhibits no distension and no mass. There is no tenderness. There is no rebound and no guarding.  Genitourinary:  Genitourinary  Comments: Small pustule lateral to rectum, no perirectal abscess, nothing to drain at this point  Musculoskeletal: Normal range of motion. He exhibits no edema or tenderness.  Neurological: He is alert and oriented to person, place, and time.  Skin: Skin is warm and dry.  Psychiatric: He has a normal mood and affect. His behavior is normal. Judgment and thought content normal.  Nursing note and vitals reviewed.    ED Treatments / Results  Labs (all labs ordered are listed, but only abnormal results are displayed) Labs Reviewed - No data to display  EKG  EKG Interpretation None       Radiology No  results found.  Procedures Procedures (including critical care time)  Medications Ordered in ED Medications - No data to display   Initial Impression / Assessment and Plan / ED Course  I have reviewed the triage vital signs and the nursing notes.  Pertinent labs & imaging results that were available during my care of the patient were reviewed by me and considered in my medical decision making (see chart for details).  Clinical Course     Patient with recurrent skin infections.  No abscess to drain today.  Will give abx.  Patient states he has rarely required I&D, but will come back if the symptoms worsen.  Final Clinical Impressions(s) / ED Diagnoses   Final diagnoses:  Skin infection    New Prescriptions New Prescriptions   DOXYCYCLINE (VIBRAMYCIN) 100 MG CAPSULE    Take 1 capsule (100 mg total) by mouth 2 (two) times daily.     Roxy Horsemanobert Sladen Plancarte, PA-C 09/19/16 [id number]    Tomasita CrumbleAdeleke Oni, MD 09/19/16 [phone]

## 2016-09-19 NOTE — ED Triage Notes (Signed)
Pt has an abcess at the top of his buttocks, he states that this is number 24, he states that he noticed it yesterday

## 2016-11-12 ENCOUNTER — Encounter (HOSPITAL_COMMUNITY): Payer: Self-pay | Admitting: Emergency Medicine

## 2016-11-12 ENCOUNTER — Emergency Department (HOSPITAL_COMMUNITY)
Admission: EM | Admit: 2016-11-12 | Discharge: 2016-11-12 | Disposition: A | Discharge status: Home / Self Care | Payer: Self-pay | Attending: Emergency Medicine | Admitting: Emergency Medicine

## 2016-11-12 DIAGNOSIS — Z79899 Other long term (current) drug therapy: Secondary | ICD-10-CM | POA: Insufficient documentation

## 2016-11-12 DIAGNOSIS — I10 Essential (primary) hypertension: Secondary | ICD-10-CM | POA: Insufficient documentation

## 2016-11-12 DIAGNOSIS — Z87891 Personal history of nicotine dependence: Secondary | ICD-10-CM | POA: Insufficient documentation

## 2016-11-12 DIAGNOSIS — L089 Local infection of the skin and subcutaneous tissue, unspecified: Secondary | ICD-10-CM | POA: Insufficient documentation

## 2016-11-12 LAB — CBG MONITORING, ED: Glucose-Capillary: 412 mg/dL — ABNORMAL HIGH (ref 65–99)

## 2016-11-12 MED ORDER — METFORMIN HCL 500 MG PO TABS: 500.0000 mg | ORAL_TABLET | Freq: Two times a day (BID) | ORAL | 0 refills | Status: DC

## 2016-11-12 MED ORDER — DOXYCYCLINE HYCLATE 100 MG PO TABS
100.0000 mg | ORAL_TABLET | Freq: Once | ORAL | Status: AC
  Administered 2016-11-12: 100 mg via ORAL
  Filled 2016-11-12: qty 1

## 2016-11-12 MED ORDER — DOXYCYCLINE HYCLATE 100 MG PO CAPS: 100.0000 mg | ORAL_CAPSULE | Freq: Two times a day (BID) | ORAL | 0 refills | Status: AC

## 2016-11-12 NOTE — ED Notes (Signed)
EDP aware of patient's blood glucose. States patient can be discharged.

## 2016-11-12 NOTE — ED Notes (Signed)
Discharge instructions, follow up care, and rx x2 reviewed with patient. Patient verbalized understanding. 

## 2016-11-12 NOTE — ED Provider Notes (Signed)
[facility]-EMERGENCY DEPT Provider Note   CSN: [id number] Arrival date & time: 11/12/16  0534   History   Chief Complaint Chief Complaint  Patient presents with  . Abscess    HPI Kevin [name] is a 51 y.o. male with a h/o MRSA, DM Type 2, and HTN presents to the Emergency Department with complaint of a LUQ abscess that began yesterday. He reports associated abdominal pain surrounding the site, which overlies a lap band port that was placed in 2006. Denies fever, chills, and rash.   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 He states he has been hospitalized in [place], [place] for previous instances. He reports recent abscesses were treated effectively with doxycycline.   History of untreated DM Type II, untreated HTN and GERD on . He reports he is currently unemployed and does not have insurance. No PCP.   The history is provided by the patient.  Abscess  Location:  Torso Abscess quality: fluctuance, induration, painful, redness and warmth   Abscess quality: not draining and not weeping   Red streaking: no   Duration:  1 day Progression:  Worsening Pain details:    Quality:  Sharp   Severity:  Moderate   Duration:  1 day Chronicity:  Recurrent Context: diabetes   Relieved by:  None tried Exacerbated by: touch. Ineffective treatments:  None tried Associated symptoms: no fever and no headaches   Risk factors: hx of MRSA and prior abscess     Past Medical History:  Diagnosis Date  . Hypertension   . MRSA (methicillin resistant Staphylococcus aureus)     There are no active problems to display for this patient.   Past Surgical History:  Procedure Laterality Date  . BACK SURGERY    . LAPAROSCOPIC GASTRIC BANDING        Home Medications    Prior to Admission medications   Medication Sig Start Date End Date Taking? Authorizing Provider  acetaminophen (TYLENOL) 500 MG tablet Take 1,000 mg by mouth every 6 (six) hours as needed for mild pain.   Yes Historical  Provider, MD  omeprazole (PRILOSEC) 40 MG capsule Take 40 mg by mouth daily.   Yes Historical Provider, MD  doxycycline (VIBRAMYCIN) 100 MG capsule Take 1 capsule (100 mg total) by mouth 2 (two) times daily. 11/12/16 11/19/16  Zachary Lovins Conan Bowens, PA-C  metFORMIN (GLUCOPHAGE) 500 MG tablet Take 1 tablet (500 mg total) by mouth 2 (two) times daily with a meal. 11/12/16   Stacie Templin Conan Bowens, PA-C    Family History History reviewed. No pertinent family history.  Social History Social History  Substance Use Topics  . Smoking status: Former Games developer  . Smokeless tobacco: Never Used  . Alcohol use No     Allergies   Patient has no known allergies.   Review of Systems Review of Systems  Constitutional: Negative for chills and fever.  Eyes: Negative for visual disturbance.  Respiratory: Negative for shortness of breath.   Cardiovascular: Negative for chest pain.  Gastrointestinal: Positive for abdominal pain.  Neurological: Negative for headaches.     Physical Exam Updated Vital Signs BP (!) 141/103 (BP Location: Left Arm)   Pulse 89   Temp 98.2 F (36.8 C) (Oral)   Resp 16   Ht 5\' 7"  (1.702 m)   Wt 81.6 kg   SpO2 94%   BMI 28.19 kg/m   Physical Exam  Constitutional: He is oriented to person, place, and time. He appears well-developed and well-nourished. No distress.  HENT:  Head: Normocephalic and atraumatic.  Neck: Normal range of motion.  Cardiovascular: Normal rate, regular rhythm and normal heart sounds.  Exam reveals no gallop and no friction rub.   No murmur heard. Pulmonary/Chest: Effort normal and breath sounds normal.  Abdominal: Soft. Bowel sounds are normal.  Musculoskeletal: Normal range of motion.  Neurological: He is alert and oriented to person, place, and time.  Skin: No rash noted. He is not diaphoretic. There is erythema.  There is a small pustule with surrounding redness on the LUQ. TTP. Minimal fluctuance at this point.   Nursing note and vitals  reviewed.    ED Treatments / Results  Labs (all labs ordered are listed, but only abnormal results are displayed) Labs Reviewed  CBG MONITORING, ED - Abnormal; Notable for the following:       Result Value   Glucose-Capillary 412 (*)    All other components within normal limits   Radiology No results found.  Procedures Procedures (including critical care time)  Medications Ordered in ED Medications  doxycycline (VIBRA-TABS) tablet 100 mg (100 mg Oral Given 11/12/16 [id number])    Initial Impression / Assessment and Plan / ED Course  I have reviewed the triage vital signs and the nursing notes.  Pertinent labs & imaging results that were available during my care of the patient were reviewed by me and considered in my medical decision making (see chart for details).    Kevin [name] is a 51 y.o. Male with a history of recurrent abscesses who presents today with a small pustule near the distal end of his lap band port. Pt also has DM Type II and is on no medications at this time.   The skin is warm, red, minimal fluctuance. Nothing to drain at this time. Will initiate doxycycline. First dose given in the ED.   Pt with a h/o of untreated DM Type II. He reports his diabetes was previously managed with diet. The patient was reluctant to receive any additional care for his chronic medical conditions. After a lengthy discussion, his blood sugar was checked and was 412. No objective signs of DKA on exam. At this time, the patient declines any further work-up in the ED due to cost. Will start Metformin 500mg  BID and [facility]   The patient was educated on reasons to return to the ED including: worsening pain, swelling, redness, streaking, or fever. The patient was urged to return to the ED or PCP in 48 hours for wound recheck if the area is not significantly improved.   The patient verbalized understanding and stated agreement with this plan.    Final Clinical Impressions(s) / ED Diagnoses   Final diagnoses:  Pustule    New Prescriptions New Prescriptions   DOXYCYCLINE (VIBRAMYCIN) 100 MG CAPSULE    Take 1 capsule (100 mg total) by mouth 2 (two) times daily.   METFORMIN (GLUCOPHAGE) 500 MG TABLET    Take 1 tablet (500 mg total) by mouth 2 (two) times daily with a meal.     Myha Arizpe Conan BowensAdair Luevenia Mcavoy, PA-C 11/12/16 [id number]    Shon Batonourtney F Horton, MD 11/12/16 2314

## 2016-11-12 NOTE — ED Notes (Signed)
Patient has a quarter sized abscess to mid upper abdomen. Surrounding area is erythematous. Patient states "I have had this happen before but it was on my arms or legs not on my abdomen area. About 7-8 years ago I was told it was MRSA and everytime this happens I just need abx and I will be fine." Area is over his lap band port which he states has not been managed in almost 8 years.

## 2016-11-12 NOTE — ED Triage Notes (Signed)
Pt reports having abscess to abdomen at site of Lapband port. Pt states he noticed abscess on 11/10/16. Pt reports prior hx of MRSA.

## 2016-11-12 NOTE — ED Provider Notes (Signed)
Patient seen in conjunction with McDonald PA-C. Patient with history of multiple abscesses, presents today with small tender papule, likely developing abscess, in the general area of the distal end of his lap band port. Patient is a diabetic. He is currently on no medications.  Patient does not have any palpable fluctuance at this time. No indication for drainage. Patient will be started on doxycycline. Patient is reluctant to get any additional care for his chronic medical issues. I had to convince him after long discussion just to get his blood sugar checked. It is 412. He has no objective signs of DKA on exam. Patient will also be given a prescription for metformin, [facility] wellness clinic follow-up. He is strongly encouraged follow-up. He does not want any further work-up or treatments, stating costs.    The patient was urged to return to the Emergency Department urgently with worsening pain, swelling, expanding erythema especially if it streaks away from the affected area, fever, or if they have any other concerns.   The patient was urged to return to the Emergency Department or go to their PCP in 48 hours for wound recheck if the area is not significantly improved.  The patient verbalized understanding and stated agreement with this plan.   BP (!) 176/106 (BP Location: Left Arm)   Pulse 112   Temp 98.2 F (36.8 C) (Oral)   Resp 16   Ht 5\' 7"  (1.702 m)   Wt 81.6 kg   SpO2 100%   BMI 28.19 kg/m    Results for orders placed or performed during the hospital encounter of 11/12/16  CBG monitoring, ED  Result Value Ref Range   Glucose-Capillary 412 (H) 65 - 99 mg/dL   Comment 1 Notify RN    No results found.       Renne CriglerJoshua Desman Polak, PA-C 11/12/16 [id number]    Shon Batonourtney F Horton, MD 11/12/16 [phone]

## 2016-12-14 ENCOUNTER — Emergency Department (HOSPITAL_COMMUNITY)
Admission: EM | Admit: 2016-12-14 | Discharge: 2016-12-14 | Disposition: A | Discharge status: Home / Self Care | Payer: Self-pay | Attending: Emergency Medicine | Admitting: Emergency Medicine

## 2016-12-14 ENCOUNTER — Encounter (HOSPITAL_COMMUNITY): Payer: Self-pay | Admitting: Emergency Medicine

## 2016-12-14 DIAGNOSIS — I10 Essential (primary) hypertension: Secondary | ICD-10-CM | POA: Insufficient documentation

## 2016-12-14 DIAGNOSIS — Z87891 Personal history of nicotine dependence: Secondary | ICD-10-CM | POA: Insufficient documentation

## 2016-12-14 DIAGNOSIS — Z7984 Long term (current) use of oral hypoglycemic drugs: Secondary | ICD-10-CM | POA: Insufficient documentation

## 2016-12-14 DIAGNOSIS — L02214 Cutaneous abscess of groin: Secondary | ICD-10-CM | POA: Insufficient documentation

## 2016-12-14 DIAGNOSIS — L0291 Cutaneous abscess, unspecified: Secondary | ICD-10-CM

## 2016-12-14 DIAGNOSIS — Z79899 Other long term (current) drug therapy: Secondary | ICD-10-CM | POA: Insufficient documentation

## 2016-12-14 MED ORDER — LIDOCAINE HCL (PF) 1 % IJ SOLN
5.0000 mL | Freq: Once | INTRAMUSCULAR | Status: AC
  Administered 2016-12-14: 5 mL
  Filled 2016-12-14: qty 30

## 2016-12-14 MED ORDER — DOXYCYCLINE HYCLATE 100 MG PO CAPS: 100.0000 mg | ORAL_CAPSULE | Freq: Two times a day (BID) | ORAL | 0 refills | Status: AC

## 2016-12-14 NOTE — ED Provider Notes (Signed)
[facility]-EMER[facility] DEPT Provider Note   CSN: [id number] Arrival date & time: 12/14/16  0506     History   Chief Complaint Chief Complaint  Patient presents with  . Abscess    HPI Kevin [name] is a 51 y.o. male presenting with inguinal abscess which he has noted 2 days ago. He states that it was draining slightly last night but no longer this am. He has an extensive hx of abscess with MRSA. This is his 26th abscess. He has been treated last month with doxy, which worked well for him. Bactrim and clinda in the past. He states that he is not feeling too well at this time but attributes to not sleeping all night due to discomfort. Denies fever, chills, nausea, vomiting, erythema, induration, testicular swelling or pain, dysuria or any other symptoms.  HPI  Past Medical History:  Diagnosis Date  . Hypertension   . MRSA (methicillin resistant Staphylococcus aureus)     There are no active problems to display for this patient.   Past Surgical History:  Procedure Laterality Date  . BACK SURGERY    . LAPAROSCOPIC GASTRIC BANDING         Home Medications    Prior to Admission medications   Medication Sig Start Date End Date Taking? Authorizing Provider  acetaminophen (TYLENOL) 500 MG tablet Take 1,000 mg by mouth every 6 (six) hours as needed for mild pain.   Yes Historical Provider, MD  omeprazole (PRILOSEC) 40 MG capsule Take 40 mg by mouth daily.   Yes Historical Provider, MD  doxycycline (VIBRAMYCIN) 100 MG capsule Take 1 capsule (100 mg total) by mouth 2 (two) times daily. 12/14/16 12/21/16  Georgiana Shore, PA-C  metFORMIN (GLUCOPHAGE) 500 MG tablet Take 1 tablet (500 mg total) by mouth 2 (two) times daily with a meal. Patient not taking: Reported on 12/14/2016 11/12/16   Mia A McDonald, PA-C    Family History History reviewed. No pertinent family history.  Social History Social History  Substance Use Topics  . Smoking status: Former Games developer  . Smokeless tobacco: Never Used    . Alcohol use No     Allergies   Patient has no known allergies.   Review of Systems Review of Systems  Constitutional: Negative for chills and fever.  HENT: Negative for congestion, ear pain and sore throat.   Eyes: Negative for pain and visual disturbance.  Respiratory: Negative for cough, choking, chest tightness, shortness of breath, wheezing and stridor.   Cardiovascular: Negative for chest pain, palpitations and leg swelling.  Gastrointestinal: Negative for abdominal distention, abdominal pain, blood in stool, diarrhea, nausea and vomiting.  Genitourinary: Negative for difficulty urinating, dysuria, flank pain, hematuria and penile pain.  Musculoskeletal: Negative for arthralgias, back pain, gait problem, myalgias, neck pain and neck stiffness.  Skin: Positive for wound. Negative for color change, pallor and rash.  Neurological: Negative for dizziness, seizures, syncope, weakness, light-headedness and headaches.  All other systems reviewed and are negative.    Physical Exam Updated Vital Signs BP (!) 161/113 (BP Location: Right Arm)   Pulse 93   Temp 98.6 F (37 C) (Oral)   Resp 19   Ht  (1.702 m)   Wt 81.6 kg   SpO2 97%   BMI 28.19 kg/m   Physical Exam  Constitutional: He appears well-developed and well-nourished. No distress.  Patient is afebrile, non-toxic appearing, sitting comfortably in bed in no acute distress.  HENT:  Head: Normocephalic and atraumatic.  Eyes: Conjunctivae and EOM  are normal.  Neck: Normal range of motion. Neck supple.  Cardiovascular: Normal rate, regular rhythm and normal heart sounds.   No murmur heard. Pulmonary/Chest: Effort normal and breath sounds normal. No respiratory distress. He has no wheezes. He has no rales. He exhibits no tenderness.  Abdominal: Soft. There is no tenderness.  Musculoskeletal: He exhibits no edema or deformity.  Neurological: He is alert.  Skin: Skin is warm and dry. No rash noted. He is not  diaphoretic. No erythema. No pallor.  1 cm x 1 cm abscess with purulent central drainage in the left inguinal region  Psychiatric: He has a normal mood and affect.  Nursing note and vitals reviewed.    ED Treatments / Results  Labs (all labs ordered are listed, but only abnormal results are displayed) Labs Reviewed - No data to display  EKG  EKG Interpretation None       Radiology No results found.  Procedures Procedures (including critical care time)  INCISION AND DRAINAGE Performed by: Georgiana Shore Consent: Verbal consent obtained. Risks and benefits: risks, benefits and alternatives were discussed Type: abscess  Body area: left inguinal area  Anesthesia: local infiltration  Incision was made with a scalpel.  Local anesthetic: lidocaine 1%   Anesthetic total: 1ml  Complexity: complex Blunt dissection to break up loculations  Drainage: purulent/blood  Drainage amount: 0.84mL  Packing material: none  Patient tolerance: Patient tolerated the procedure well with no immediate complications.    Medications Ordered in ED Medications  lidocaine (PF) (XYLOCAINE) 1 % injection 5 mL (5 mLs Infiltration Given by Other 12/14/16 0809)     Initial Impression / Assessment and Plan / ED Course  I have reviewed the triage vital signs and the nursing notes.  Pertinent labs & imaging results that were available during my care of the patient were reviewed by me and considered in my medical decision making (see chart for details).     Patient presents with left inguinal abscess and hx of MRSA with 26 abscesses in the past.  Abscess was drained  and patient tolerated well. Discharge home with symptomatic relief, warm soaks, and antibiotics with close follow-up with PCP in 48 hours for wound recheck.  Discussed strict return precautions and advised to return to the emergency department if experiencing any new or worsening symptoms. Instructions were understood and  patient agreed with discharge plan.  Final Clinical Impressions(s) / ED Diagnoses   Final diagnoses:  Abscess    New Prescriptions New Prescriptions   DOXYCYCLINE (VIBRAMYCIN) 100 MG CAPSULE    Take 1 capsule (100 mg total) by mouth 2 (two) times daily.     Georgiana Shore, PA-C 12/14/16 1610    Shaune Pollack, MD 12/15/16 1245

## 2016-12-14 NOTE — ED Notes (Signed)
ED Provider at bedside. 

## 2016-12-14 NOTE — ED Triage Notes (Signed)
Patient complaining of abscess in right groin. Patient states he has a hx of mrsa and this is his 26th episode. Patient states it is draining a little. Patient states it is painful.

## 2016-12-14 NOTE — Discharge Instructions (Signed)
Please follow-up with your primary care provider for wound recheck in 48 hours. You may use warm compresses in the area to encourage drainage.  Return to the emergency department if you experience fever, chills, erythema and warmth at the site, increased pain or any signs of worsening infection.

## 2017-01-21 ENCOUNTER — Emergency Department (HOSPITAL_COMMUNITY)
Admission: EM | Admit: 2017-01-21 | Discharge: 2017-01-21 | Disposition: A | Discharge status: Home / Self Care | Payer: Self-pay

## 2017-02-28 ENCOUNTER — Emergency Department (HOSPITAL_COMMUNITY)
Admission: EM | Admit: 2017-02-28 | Discharge: 2017-02-28 | Disposition: A | Discharge status: Home / Self Care | Payer: Self-pay | Attending: Emergency Medicine | Admitting: Emergency Medicine

## 2017-02-28 ENCOUNTER — Encounter (HOSPITAL_COMMUNITY): Payer: Self-pay | Admitting: *Deleted

## 2017-02-28 DIAGNOSIS — Z87891 Personal history of nicotine dependence: Secondary | ICD-10-CM | POA: Insufficient documentation

## 2017-02-28 DIAGNOSIS — L0231 Cutaneous abscess of buttock: Secondary | ICD-10-CM | POA: Insufficient documentation

## 2017-02-28 DIAGNOSIS — Z7902 Long term (current) use of antithrombotics/antiplatelets: Secondary | ICD-10-CM | POA: Insufficient documentation

## 2017-02-28 DIAGNOSIS — L0501 Pilonidal cyst with abscess: Secondary | ICD-10-CM

## 2017-02-28 DIAGNOSIS — Z8614 Personal history of Methicillin resistant Staphylococcus aureus infection: Secondary | ICD-10-CM | POA: Insufficient documentation

## 2017-02-28 DIAGNOSIS — I10 Essential (primary) hypertension: Secondary | ICD-10-CM | POA: Insufficient documentation

## 2017-02-28 DIAGNOSIS — Z7984 Long term (current) use of oral hypoglycemic drugs: Secondary | ICD-10-CM | POA: Insufficient documentation

## 2017-02-28 MED ORDER — CLINDAMYCIN HCL 150 MG PO CAPS: 300.0000 mg | ORAL_CAPSULE | Freq: Four times a day (QID) | ORAL | 0 refills | Status: AC

## 2017-02-28 MED ORDER — OXYCODONE-ACETAMINOPHEN 5-325 MG PO TABS: 1.0000 | ORAL_TABLET | ORAL | 0 refills | Status: DC | PRN

## 2017-02-28 MED ORDER — DOXYCYCLINE HYCLATE 100 MG PO CAPS: 100.0000 mg | ORAL_CAPSULE | Freq: Two times a day (BID) | ORAL | 0 refills | Status: DC

## 2017-02-28 MED ORDER — OXYCODONE-ACETAMINOPHEN 5-325 MG PO TABS
1.0000 | ORAL_TABLET | Freq: Once | ORAL | Status: AC
  Administered 2017-02-28: 1 via ORAL
  Filled 2017-02-28: qty 1

## 2017-02-28 MED ORDER — LIDOCAINE-EPINEPHRINE (PF) 2 %-1:200000 IJ SOLN
10.0000 mL | Freq: Once | INTRAMUSCULAR | Status: AC
  Administered 2017-02-28: 10 mL via INTRADERMAL
  Filled 2017-02-28: qty 20

## 2017-02-28 NOTE — ED Notes (Signed)
Pt states that he was dx with MRSA in 2013  and has had 27 previous abscess, and  Is usually treated with doxycycline that has been effective in the past. Pt reports that  abscess appeared today on the middle  Inner region of buttocks, area is red swollen and painful 7/10 pt reports burning sensation and that he was unable to sleep.

## 2017-02-28 NOTE — ED Provider Notes (Signed)
[facility] DEPT Provider Note   CSN: [id number] Arrival date & time: 02/28/17  0450     History   Chief Complaint Chief Complaint  Patient presents with  . Abscess    HPI Chauncy LeanDarren Brasel is a 51 y.o. male.  Patient with a history of recurrent MRSA infections presents with new abscess to left buttock that started yesterday. No drainage. He denies fever, nausea or vomiting. No other abscesses.    The history is provided by the patient. No language interpreter was used.    Past Medical History:  Diagnosis Date  . Hypertension   . MRSA (methicillin resistant Staphylococcus aureus)     There are no active problems to display for this patient.   Past Surgical History:  Procedure Laterality Date  . BACK SURGERY    . LAPAROSCOPIC GASTRIC BANDING         Home Medications    Prior to Admission medications   Medication Sig Start Date End Date Taking? Authorizing Provider  acetaminophen (TYLENOL) 500 MG tablet Take 1,000 mg by mouth every 6 (six) hours as needed for mild pain.    [provider]  metFORMIN (GLUCOPHAGE) 500 MG tablet Take 1 tablet (500 mg total) by mouth 2 (two) times daily with a meal. Patient not taking: Reported on 12/14/2016 11/12/16   McDonald, Mia A, PA-C  omeprazole (PRILOSEC) 40 MG capsule Take 40 mg by mouth daily.    [provider]    Family History History reviewed. No pertinent family history.  Social History Social History  Substance Use Topics  . Smoking status: Former Games developermoker  . Smokeless tobacco: Never Used  . Alcohol use No     Allergies   Patient has no known allergies.   Review of Systems Review of Systems  Constitutional: Negative for chills and fever.  Gastrointestinal: Negative.  Negative for nausea.  Musculoskeletal: Negative.  Negative for myalgias.  Skin: Positive for wound.  Neurological: Negative.      Physical Exam Updated Vital Signs BP (!) 152/113 (BP Location: Right Arm)   Pulse (!) 106    Temp 98.2 F (36.8 C) (Oral)   Resp 18   Ht 5\' 7"  (1.702 m)   Wt 81.6 kg (180 lb)   SpO2 97%   BMI 28.19 kg/m   Physical Exam  Constitutional: He is oriented to person, place, and time. He appears well-developed and well-nourished.  Neck: Normal range of motion.  Pulmonary/Chest: Effort normal.  Musculoskeletal: Normal range of motion.  Neurological: He is alert and oriented to person, place, and time.  Skin: Skin is warm and dry.  2 cm x 3 cm area of induration to left buttock at the pilonidal region. There is a small pustule at the center without fluctuance. Significant tenderness.   Psychiatric: He has a normal mood and affect.     ED Treatments / Results  Labs (all labs ordered are listed, but only abnormal results are displayed) Labs Reviewed - No data to display  EKG  EKG Interpretation None       Radiology No results found.  Procedures Procedures (including critical care time)  Medications Ordered in ED Medications  oxyCODONE-acetaminophen (PERCOCET/ROXICET) 5-325 MG per tablet 1 tablet (1 tablet Oral Given 02/28/17 0611)  lidocaine-EPINEPHrine (XYLOCAINE W/EPI) 2 %-1:200000 (PF) injection 10 mL (10 mLs Intradermal Given 02/28/17 0612)     Initial Impression / Assessment and Plan / ED Course  I have reviewed the triage vital signs and the nursing notes.  Pertinent  labs & imaging results that were available during my care of the patient were reviewed by me and considered in my medical decision making (see chart for details).     Patient with recurrent MRSA infections presents with pilonidal abscess, drained by Fayrene Helper, PA-C. See his procedure note.   Uncomplicated abscess. Will provide Rx for antibiotics given significant history of MRSA.  Final Clinical Impressions(s) / ED Diagnoses   Final diagnoses:  None   1. Pilonidal abscess  New Prescriptions New Prescriptions   No medications on file     Danne Harbor 02/28/17 1610      Dione Booze, MD 02/28/17 [phone]

## 2017-02-28 NOTE — ED Provider Notes (Signed)
An I&D procedure was performed by PA student under my direct supervision.    INCISION AND DRAINAGE Performed by: Fayrene HelperRAN,Merlie Noga Consent: Verbal consent obtained. Risks and benefits: risks, benefits and alternatives were discussed Type: abscess  Body area: L gluteal abscess  Anesthesia: local infiltration  Incision was made with a scalpel.  Local anesthetic: lidocaine 2% w epinephrine  Anesthetic total: 3 ml  Complexity: complex Blunt dissection to break up loculations  Drainage: purulent  Drainage amount: minimal  Packing material: none  Patient tolerance: Patient tolerated the procedure well with no immediate complications.      Fayrene Helperran, Huriel Matt, PA-C 02/28/17 [id number]    Dione BoozeGlick, David, MD 02/28/17 [id number]

## 2017-02-28 NOTE — ED Triage Notes (Signed)
Patient is alert and oriented x4.  He is being seen for an abscess on his buttock.  He was Dx with MRSA a long time ago and continues to have recurring abscess.  Currently he rates his pain 7 of 10.

## 2017-04-04 ENCOUNTER — Encounter (HOSPITAL_COMMUNITY): Payer: Self-pay

## 2017-04-04 ENCOUNTER — Emergency Department (HOSPITAL_COMMUNITY)
Admission: EM | Admit: 2017-04-04 | Discharge: 2017-04-04 | Disposition: A | Discharge status: Home / Self Care | Payer: Self-pay | Attending: Emergency Medicine | Admitting: Emergency Medicine

## 2017-04-04 DIAGNOSIS — L02426 Furuncle of left lower limb: Secondary | ICD-10-CM | POA: Insufficient documentation

## 2017-04-04 DIAGNOSIS — Z87891 Personal history of nicotine dependence: Secondary | ICD-10-CM | POA: Insufficient documentation

## 2017-04-04 DIAGNOSIS — I10 Essential (primary) hypertension: Secondary | ICD-10-CM | POA: Insufficient documentation

## 2017-04-04 DIAGNOSIS — Z8614 Personal history of Methicillin resistant Staphylococcus aureus infection: Secondary | ICD-10-CM | POA: Insufficient documentation

## 2017-04-04 DIAGNOSIS — L02429 Furuncle of limb, unspecified: Secondary | ICD-10-CM

## 2017-04-04 DIAGNOSIS — Z79899 Other long term (current) drug therapy: Secondary | ICD-10-CM | POA: Insufficient documentation

## 2017-04-04 MED ORDER — DOXYCYCLINE HYCLATE 100 MG PO CAPS: 100.0000 mg | ORAL_CAPSULE | Freq: Two times a day (BID) | ORAL | 0 refills | Status: DC

## 2017-04-04 NOTE — ED Provider Notes (Signed)
[facility]-EMERGENCY DEPT Provider Note   CSN: [id number] Arrival date & time: 04/04/17  [id number]     History   Chief Complaint Chief Complaint  Patient presents with  . Abscess    HPI Kevin [name] is a 51 y.o. male.  The history is provided by the patient and medical records.  Abscess     51 year old male with history of hypertension and MRSA, presenting to the ED for small boils right thigh. Reports he was diagnosed with MRSA about 5 years ago and has had 30+ boils develop all over his body. States in the past he has been hospitalized when the "get out of control". States usually he comes in to get antibiotics to prevent that. States he did notice a small amount of drainage from area of right lateral thigh yesterday, but none today. He denies any fever. No pain out of proportion. States usually antibiotics work well and he does not require I&D.  Past Medical History:  Diagnosis Date  . Hypertension   . MRSA (methicillin resistant Staphylococcus aureus)     There are no active problems to display for this patient.   Past Surgical History:  Procedure Laterality Date  . BACK SURGERY    . LAPAROSCOPIC GASTRIC BANDING         Home Medications    Prior to Admission medications   Medication Sig Start Date End Date Taking? Authorizing Provider  acetaminophen (TYLENOL) 500 MG tablet Take 1,000 mg by mouth every 6 (six) hours as needed for mild pain.   Yes [provider]  omeprazole (PRILOSEC) 40 MG capsule Take 40 mg by mouth daily.   Yes [provider]  doxycycline (VIBRAMYCIN) 100 MG capsule Take 1 capsule (100 mg total) by mouth 2 (two) times daily. Patient not taking: Reported on 04/04/2017 02/28/17   Elpidio AnisUpstill, Shari, PA-C  metFORMIN (GLUCOPHAGE) 500 MG tablet Take 1 tablet (500 mg total) by mouth 2 (two) times daily with a meal. Patient not taking: Reported on 12/14/2016 11/12/16   McDonald, Pedro EarlsMia A, PA-C  oxyCODONE-acetaminophen (PERCOCET/ROXICET) 5-325 MG tablet  Take 1-2 tablets by mouth every 4 (four) hours as needed for severe pain. Patient not taking: Reported on 04/04/2017 02/28/17   Elpidio AnisUpstill, Shari, PA-C    Family History History reviewed. No pertinent family history.  Social History Social History  Substance Use Topics  . Smoking status: Former Games developermoker  . Smokeless tobacco: Never Used  . Alcohol use No     Allergies   Patient has no known allergies.   Review of Systems Review of Systems  Skin:       boils  All other systems reviewed and are negative.    Physical Exam Updated Vital Signs BP (!) 147/106 (BP Location: Right Arm)   Pulse (!) 102   Temp 98.1 F (36.7 C) (Oral)   Resp 18   SpO2 98%   Physical Exam  Constitutional: He is oriented to person, place, and time. He appears well-developed and well-nourished.  HENT:  Head: Normocephalic and atraumatic.  Mouth/Throat: Oropharynx is clear and moist.  Eyes: Pupils are equal, round, and reactive to light. Conjunctivae and EOM are normal.  Neck: Normal range of motion.  Cardiovascular: Normal rate, regular rhythm and normal heart sounds.   Pulmonary/Chest: Effort normal and breath sounds normal.  Abdominal: Soft. Bowel sounds are normal.  Musculoskeletal: Normal range of motion.  2 small areas of right thigh (lateral and medial) that are consistent with MRSA wounds; there is no active drainage  or bleeding; no surrounding erythema or induration, no streaking of the leg; no tissue crepitus or fluctuance  Neurological: He is alert and oriented to person, place, and time.  Skin: Skin is warm and dry.  Psychiatric: He has a normal mood and affect.  Nursing note and vitals reviewed.    ED Treatments / Results  Labs (all labs ordered are listed, but only abnormal results are displayed) Labs Reviewed - No data to display  EKG  EKG Interpretation None       Radiology No results found.  Procedures Procedures (including critical care time)  Medications Ordered  in ED Medications - No data to display   Initial Impression / Assessment and Plan / ED Course  I have reviewed the triage vital signs and the nursing notes.  Pertinent labs & imaging results that were available during my care of the patient were reviewed by me and considered in my medical decision making (see chart for details).  51 year old male here with boils of the right thigh. These are consistent with MRSA wounds. I do not appreciate any significant abscess formation at this time. Do not feel he requires I&D. He is afebrile and nontoxic. Will treat with doxycycline as this has worked well for him in the past.  Discussed home wound care, close monitoring for any worsening of symptoms. Return precautions discussed, he acknowledged understanding of his care plan.  Final Clinical Impressions(s) / ED Diagnoses   Final diagnoses:  Boil of leg except foot    New Prescriptions Discharge Medication List as of 04/04/2017  6:40 AM       Garlon HatchetSanders, Lisa M, PA-C 04/04/17 [id number]    Shon BatonHorton, Courtney F, MD 04/05/17 0005

## 2017-04-04 NOTE — Discharge Instructions (Signed)
Take the prescribed medication as directed.  Can use warm compresses at home as well. Follow-up with your primary care doctor. Return to the ED for new or worsening symptoms.

## 2017-04-04 NOTE — ED Triage Notes (Signed)
Pt has an abscess on the bag of his right thigh, sore and small amount of drainage

## 2017-04-04 NOTE — ED Notes (Signed)
Bed: [facility] Expected date:  Expected time:  Means of arrival:  Comments: 

## 2017-04-23 ENCOUNTER — Emergency Department (HOSPITAL_COMMUNITY)
Admission: EM | Admit: 2017-04-23 | Discharge: 2017-04-23 | Disposition: A | Discharge status: Home / Self Care | Payer: Self-pay | Attending: Emergency Medicine | Admitting: Emergency Medicine

## 2017-04-23 ENCOUNTER — Encounter (HOSPITAL_COMMUNITY): Payer: Self-pay

## 2017-04-23 DIAGNOSIS — Z79899 Other long term (current) drug therapy: Secondary | ICD-10-CM | POA: Insufficient documentation

## 2017-04-23 DIAGNOSIS — R739 Hyperglycemia, unspecified: Secondary | ICD-10-CM | POA: Insufficient documentation

## 2017-04-23 DIAGNOSIS — I10 Essential (primary) hypertension: Secondary | ICD-10-CM | POA: Insufficient documentation

## 2017-04-23 DIAGNOSIS — Z87891 Personal history of nicotine dependence: Secondary | ICD-10-CM | POA: Insufficient documentation

## 2017-04-23 LAB — I-STAT CHEM 8, ED
BUN: 6 mg/dL (ref 6–20)
CALCIUM ION: 1.11 mmol/L — AB (ref 1.15–1.40)
CREATININE: 0.8 mg/dL (ref 0.61–1.24)
Chloride: 95 mmol/L — ABNORMAL LOW (ref 101–111)
GLUCOSE: 465 mg/dL — AB (ref 65–99)
HCT: 42 % (ref 39.0–52.0)
HEMOGLOBIN: 14.3 g/dL (ref 13.0–17.0)
Potassium: 4 mmol/L (ref 3.5–5.1)
Sodium: 133 mmol/L — ABNORMAL LOW (ref 135–145)
TCO2: 25 mmol/L (ref 0–100)

## 2017-04-23 LAB — I-STAT TROPONIN, ED: Troponin i, poc: 0 ng/mL (ref 0.00–0.08)

## 2017-04-23 MED ORDER — DIAZEPAM 5 MG PO TABS
5.0000 mg | ORAL_TABLET | Freq: Once | ORAL | Status: AC
  Administered 2017-04-23: 5 mg via ORAL
  Filled 2017-04-23: qty 1

## 2017-04-23 MED ORDER — METFORMIN HCL 500 MG PO TABS: 500.0000 mg | ORAL_TABLET | Freq: Two times a day (BID) | ORAL | 2 refills | Status: DC

## 2017-04-23 MED ORDER — METFORMIN HCL 500 MG PO TABS
500.0000 mg | ORAL_TABLET | Freq: Once | ORAL | Status: AC
  Administered 2017-04-23: 500 mg via ORAL
  Filled 2017-04-23: qty 1

## 2017-04-23 NOTE — ED Notes (Signed)
MD aware of BP and it was addressed in d/c paperwork.

## 2017-04-23 NOTE — ED Notes (Signed)
MD at bedside. 

## 2017-04-23 NOTE — ED Triage Notes (Signed)
Pt states that he felt his left arm go numb about 4pm this afternoon, he wasn't able to feel anyone touching him Pt's blood pressure and heart rate are elevated

## 2017-04-23 NOTE — Discharge Instructions (Signed)
Follow a diabetic diet and take the diabetic medications as directed. Your blood pressure was elevated and needs to be repeated. Follow-up in the clinic

## 2017-04-23 NOTE — ED Provider Notes (Signed)
[facility] DE[facility] Provider Note   CSN: [id number] Arrival date & time: 04/23/17  2004     History   Chief Complaint Chief Complaint  Patient presents with  . left arm numbness    HPI Kevin [name] is a 51 y.o. male.  51 year old male presents with intermittent paresthesias started above his left elbow and into his left wrist. No associated headache or neck discomfort. No slurred speech or confusion. He denies any associated cardiac symptoms such as dyspnea or diaphoresis. No exertional component.Symptoms lasted for 20 minutes and have since resolved without treatment. No prior history of same. Denies any wrist.. Does admit to feeling very anxious at this time but denies any illicit drug use currently. Denies any gait ataxia. Symptoms wax and wane. No treatment use prior to arrival      Past Medical History:  Diagnosis Date  . Hypertension   . MRSA (methicillin resistant Staphylococcus aureus)     There are no active problems to display for this patient.   Past Surgical History:  Procedure Laterality Date  . BACK SURGERY    . LAPAROSCOPIC GASTRIC BANDING         Home Medications    Prior to Admission medications   Medication Sig Start Date End Date Taking? Authorizing Provider  acetaminophen (TYLENOL) 500 MG tablet Take 1,000 mg by mouth every 6 (six) hours as needed for mild pain.    [provider]  doxycycline (VIBRAMYCIN) 100 MG capsule Take 1 capsule (100 mg total) by mouth 2 (two) times daily. 04/04/17   Garlon Hatchet, PA-C  metFORMIN (GLUCOPHAGE) 500 MG tablet Take 1 tablet (500 mg total) by mouth 2 (two) times daily with a meal. Patient not taking: Reported on 12/14/2016 11/12/16   McDonald, Mia A, PA-C  omeprazole (PRILOSEC) 40 MG capsule Take 40 mg by mouth daily.    [provider]  oxyCODONE-acetaminophen (PERCOCET/ROXICET) 5-325 MG tablet Take 1-2 tablets by mouth every 4 (four) hours as needed for severe pain. Patient not taking:  Reported on 04/04/2017 02/28/17   Elpidio Anis, PA-C    Family History History reviewed. No pertinent family history.  Social History Social History  Substance Use Topics  . Smoking status: Former Games developer  . Smokeless tobacco: Never Used  . Alcohol use No     Allergies   Patient has no known allergies.   Review of Systems Review of Systems  All other systems reviewed and are negative.    Physical Exam Updated Vital Signs BP (!) 181/116 (BP Location: Left Arm)   Pulse (!) 122   Temp 98.6 F (37 C) (Oral)   Resp 20   SpO2 99%   Physical Exam  Constitutional: He is oriented to person, place, and time. He appears well-developed and well-nourished.  Non-toxic appearance. No distress.  HENT:  Head: Normocephalic and atraumatic.  Eyes: Pupils are equal, round, and reactive to light. Conjunctivae, EOM and lids are normal.  Neck: Normal range of motion. Neck supple. No tracheal deviation present. No thyroid mass present.  Cardiovascular: Normal rate, regular rhythm and normal heart sounds.  Exam reveals no gallop.   No murmur heard. Pulmonary/Chest: Effort normal and breath sounds normal. No stridor. No respiratory distress. He has no decreased breath sounds. He has no wheezes. He has no rhonchi. He has no rales.  Abdominal: Soft. Normal appearance and bowel sounds are normal. He exhibits no distension. There is no tenderness. There is no rebound and no CVA tenderness.  Musculoskeletal: Normal range  of motion. He exhibits no edema or tenderness.  Neurological: He is alert and oriented to person, place, and time. He has normal strength. No cranial nerve deficit or sensory deficit. He displays a negative Romberg sign. GCS eye subscore is 4. GCS verbal subscore is 5. GCS motor subscore is 6.  Skin: Skin is warm and dry. No abrasion and no rash noted.  Psychiatric: He has a normal mood and affect. His speech is normal and behavior is normal.  Nursing note and vitals  reviewed.    ED Treatments / Results  Labs (all labs ordered are listed, but only abnormal results are displayed) Labs Reviewed  I-STAT CHEM 8, ED  I-STAT TROPONIN, ED    EKG  EKG Interpretation  Date/Time:  Tuesday April 23 2017 20:14:17 EDT Ventricular Rate:  133 PR Interval:    QRS Duration: 92 QT Interval:  349 QTC Calculation: 459 R Axis:   16 Text Interpretation:  Sinus tachycardia Multiple premature complexes, vent & supraven Probable left atrial enlargement Minimal ST depression, lateral leads Confirmed by Lorre NickAllen, Benjimin Hadden ([id number]) on 04/23/2017 8:36:33 PM       Radiology No results found.  Procedures Procedures (including critical care time)  Medications Ordered in ED Medications  diazepam (VALIUM) tablet 5 mg (not administered)     Initial Impression / Assessment and Plan / ED Course  I have reviewed the triage vital signs and the nursing notes.  Pertinent labs & imaging results that were available during my care of the patient were reviewed by me and considered in my medical decision making (see chart for details).     Patient given Valium for his anxiety. Blood sugar noted and treated with oral Glucophage. States that he has been noncompliant with his diabetic regimen. Will place back on Glucophage. He has no focal neurological deficits. No concern for ACS. Doubt that this is a TIA or stroke. He only has symptoms from the elbow to the wrist. He is neurovascularly intact at the left hand. Will be discharged and return precautions given  Final Clinical Impressions(s) / ED Diagnoses   Final diagnoses:  None    New Prescriptions New Prescriptions   No medications on file     Lorre NickAllen, Hennie Gosa, MD 04/23/17 2233

## 2017-04-30 ENCOUNTER — Emergency Department (HOSPITAL_COMMUNITY)
Admission: EM | Admit: 2017-04-30 | Discharge: 2017-04-30 | Disposition: A | Discharge status: Home / Self Care | Payer: Self-pay | Attending: Emergency Medicine | Admitting: Emergency Medicine

## 2017-04-30 ENCOUNTER — Encounter (HOSPITAL_COMMUNITY): Payer: Self-pay | Admitting: Emergency Medicine

## 2017-04-30 DIAGNOSIS — R202 Paresthesia of skin: Secondary | ICD-10-CM

## 2017-04-30 DIAGNOSIS — R200 Anesthesia of skin: Secondary | ICD-10-CM | POA: Insufficient documentation

## 2017-04-30 DIAGNOSIS — R209 Unspecified disturbances of skin sensation: Secondary | ICD-10-CM

## 2017-04-30 DIAGNOSIS — Z5321 Procedure and treatment not carried out due to patient leaving prior to being seen by health care provider: Secondary | ICD-10-CM | POA: Insufficient documentation

## 2017-04-30 LAB — CBC WITH DIFFERENTIAL/PLATELET
BASOS ABS: 0 10*3/uL (ref 0.0–0.1)
BASOS PCT: 1 %
Eosinophils Absolute: 0.1 10*3/uL (ref 0.0–0.7)
Eosinophils Relative: 2 %
HCT: 39.3 % (ref 39.0–52.0)
HEMOGLOBIN: 14.1 g/dL (ref 13.0–17.0)
Lymphocytes Relative: 21 %
Lymphs Abs: 1.3 10*3/uL (ref 0.7–4.0)
MCH: 30.6 pg (ref 26.0–34.0)
MCHC: 35.9 g/dL (ref 30.0–36.0)
MCV: 85.2 fL (ref 78.0–100.0)
Monocytes Absolute: 0.5 10*3/uL (ref 0.1–1.0)
Monocytes Relative: 7 %
NEUTROS PCT: 69 %
Neutro Abs: 4.4 10*3/uL (ref 1.7–7.7)
Platelets: 225 10*3/uL (ref 150–400)
RBC: 4.61 MIL/uL (ref 4.22–5.81)
RDW: 12.2 % (ref 11.5–15.5)
WBC: 6.3 10*3/uL (ref 4.0–10.5)

## 2017-04-30 LAB — COMPREHENSIVE METABOLIC PANEL
ALBUMIN: 4 g/dL (ref 3.5–5.0)
ALK PHOS: 95 U/L (ref 38–126)
ALT: 17 U/L (ref 17–63)
AST: 18 U/L (ref 15–41)
Anion gap: 10 (ref 5–15)
BUN: 6 mg/dL (ref 6–20)
CHLORIDE: 97 mmol/L — AB (ref 101–111)
CO2: 28 mmol/L (ref 22–32)
CREATININE: 0.79 mg/dL (ref 0.61–1.24)
Calcium: 9.2 mg/dL (ref 8.9–10.3)
GFR calc Af Amer: >60 mL/min (ref >60)
GFR calc non Af Amer: >60 mL/min (ref >60)
GLUCOSE: 314 mg/dL — AB (ref 65–99)
Potassium: 3.9 mmol/L (ref 3.5–5.1)
SODIUM: 135 mmol/L (ref 135–145)
Total Bilirubin: 0.9 mg/dL (ref 0.3–1.2)
Total Protein: 7.2 g/dL (ref 6.5–8.1)

## 2017-04-30 NOTE — ED Triage Notes (Addendum)
Patient states that left side face went numb aorund 930am today while in kitchen leaning against counter while daughter was eating cereal.  Patient reports that facial numbness lasted about less than minute.  Patient reports being here last week for left arm numbness that still on going.  Patient has no neuro deficits at this time.  Made Dr Fredderick Phenix aware, who gave verbal for basic labs at this time and no further stroke work up needed at this time.

## 2017-04-30 NOTE — ED Notes (Signed)
Called Pt for vital recheck no response in the lobby.

## 2017-05-03 ENCOUNTER — Emergency Department (HOSPITAL_COMMUNITY): Payer: Self-pay

## 2017-05-03 ENCOUNTER — Emergency Department (HOSPITAL_COMMUNITY)
Admission: EM | Admit: 2017-05-03 | Discharge: 2017-05-03 | Disposition: A | Discharge status: Home / Self Care | Payer: Self-pay | Attending: Emergency Medicine | Admitting: Emergency Medicine

## 2017-05-03 ENCOUNTER — Encounter (HOSPITAL_COMMUNITY): Payer: Self-pay | Admitting: Emergency Medicine

## 2017-05-03 DIAGNOSIS — I10 Essential (primary) hypertension: Secondary | ICD-10-CM | POA: Insufficient documentation

## 2017-05-03 DIAGNOSIS — Z87891 Personal history of nicotine dependence: Secondary | ICD-10-CM | POA: Insufficient documentation

## 2017-05-03 DIAGNOSIS — R202 Paresthesia of skin: Secondary | ICD-10-CM | POA: Insufficient documentation

## 2017-05-03 DIAGNOSIS — Z7984 Long term (current) use of oral hypoglycemic drugs: Secondary | ICD-10-CM | POA: Insufficient documentation

## 2017-05-03 DIAGNOSIS — E119 Type 2 diabetes mellitus without complications: Secondary | ICD-10-CM | POA: Insufficient documentation

## 2017-05-03 DIAGNOSIS — Z8614 Personal history of Methicillin resistant Staphylococcus aureus infection: Secondary | ICD-10-CM | POA: Insufficient documentation

## 2017-05-03 LAB — BASIC METABOLIC PANEL
ANION GAP: 10 (ref 5–15)
BUN: 7 mg/dL (ref 6–20)
CALCIUM: 9.4 mg/dL (ref 8.9–10.3)
CHLORIDE: 99 mmol/L — AB (ref 101–111)
CO2: 24 mmol/L (ref 22–32)
CREATININE: 0.81 mg/dL (ref 0.61–1.24)
GFR calc non Af Amer: >60 mL/min (ref >60)
Glucose, Bld: 375 mg/dL — ABNORMAL HIGH (ref 65–99)
Potassium: 3.6 mmol/L (ref 3.5–5.1)
SODIUM: 133 mmol/L — AB (ref 135–145)

## 2017-05-03 LAB — CBC WITH DIFFERENTIAL/PLATELET
BASOS ABS: 0.1 10*3/uL (ref 0.0–0.1)
BASOS PCT: 1 %
Eosinophils Absolute: 0.1 10*3/uL (ref 0.0–0.7)
Eosinophils Relative: 2 %
HEMATOCRIT: 41.7 % (ref 39.0–52.0)
HEMOGLOBIN: 15 g/dL (ref 13.0–17.0)
Lymphocytes Relative: 31 %
Lymphs Abs: 2 10*3/uL (ref 0.7–4.0)
MCH: 30.5 pg (ref 26.0–34.0)
MCHC: 36 g/dL (ref 30.0–36.0)
MCV: 84.8 fL (ref 78.0–100.0)
Monocytes Absolute: 0.6 10*3/uL (ref 0.1–1.0)
Monocytes Relative: 9 %
NEUTROS PCT: 57 %
Neutro Abs: 3.7 10*3/uL (ref 1.7–7.7)
Platelets: 269 10*3/uL (ref 150–400)
RBC: 4.92 MIL/uL (ref 4.22–5.81)
RDW: 12 % (ref 11.5–15.5)
WBC: 6.4 10*3/uL (ref 4.0–10.5)

## 2017-05-03 MED ORDER — SODIUM CHLORIDE 0.9 % IV SOLN
INTRAVENOUS | Status: DC
  Administered 2017-05-03: 08:00:00 via INTRAVENOUS

## 2017-05-03 MED ORDER — AMLODIPINE BESYLATE 5 MG PO TABS: 5.0000 mg | ORAL_TABLET | Freq: Every day | ORAL | 0 refills | Status: DC

## 2017-05-03 NOTE — Care Management Note (Signed)
Case Management Note  CM noted pt with 7 ED visits, no ins, no PCP.  Spoke with pt who stated he was interested in [facility] but would not let me make an appointment for him.  He states things are too complicated and he can't go to any appointment that would be made. Pt then refused all offers of assistance.  CM asked if pt could elaborate in an effort to see if CM could help in other ways.  Pt explained his dog is 69 years old and has to be given medication every 2 hours and just being in the ED is causing him increased stress and anxiety over not being with his dog to care for her.  Pt stated he could get a ride when he needed one to get to places but otherwise didn't have transportation. Pt ultimately became very agitated and continued to refuse assistance.  Updated Dr. Freida Busman.  No further CM needs noted.

## 2017-05-03 NOTE — ED Provider Notes (Signed)
[facility] DEPT Provider Note   CSN: [id number] Arrival date & time: 05/03/17  0449     History   Chief Complaint Chief Complaint  Patient presents with  . Numbness    HPI Kevin [name] is a 51 y.o. male.  51 year old male with history of diabetes and hypertension seen by myself several days ago for similar symptoms presents with intermittent numbness to his left forearm times several weeks. Symptoms last between 1 minute and 5 minutes and are not associated with left hand weakness, headache, slurred speech, ataxia, visual changes. Did have an episode yesterday for about a minute with less of his face became numb. Resolved on its own but it was associated with left forearm paresthesias extending down to his left hand. He is currently asymptomatic. Notes good nutrition. No treatment use prior to arrival.      Past Medical History:  Diagnosis Date  . Hypertension   . MRSA (methicillin resistant Staphylococcus aureus)     There are no active problems to display for this patient.   Past Surgical History:  Procedure Laterality Date  . BACK SURGERY    . LAPAROSCOPIC GASTRIC BANDING         Home Medications    Prior to Admission medications   Medication Sig Start Date End Date Taking? Authorizing Provider  acetaminophen (TYLENOL) 500 MG tablet Take 1,000 mg by mouth every 6 (six) hours as needed for mild pain.    [provider]  doxycycline (VIBRAMYCIN) 100 MG capsule Take 1 capsule (100 mg total) by mouth 2 (two) times daily. Patient not taking: Reported on 04/23/2017 04/04/17   Garlon Hatchet, PA-C  metFORMIN (GLUCOPHAGE) 500 MG tablet Take 1 tablet (500 mg total) by mouth 2 (two) times daily with a meal. 04/23/17   Lorre Nick, MD  omeprazole (PRILOSEC) 40 MG capsule Take 40 mg by mouth daily.    [provider]  oxyCODONE-acetaminophen (PERCOCET/ROXICET) 5-325 MG tablet Take 1-2 tablets by mouth every 4 (four) hours as needed for severe  pain. Patient not taking: Reported on 04/04/2017 02/28/17   Elpidio Anis, PA-C    Family History History reviewed. No pertinent family history.  Social History Social History  Substance Use Topics  . Smoking status: Former Games developer  . Smokeless tobacco: Never Used  . Alcohol use No     Allergies   Patient has no known allergies.   Review of Systems Review of Systems  All other systems reviewed and are negative.    Physical Exam Updated Vital Signs BP (!) 171/111 (BP Location: Left Arm)   Pulse (!) 103   Temp 98.5 F (36.9 C) (Oral)   Resp 20   SpO2 99%   Physical Exam  Constitutional: He is oriented to person, place, and time. He appears well-developed and well-nourished.  Non-toxic appearance. No distress.  HENT:  Head: Normocephalic and atraumatic.  Eyes: Pupils are equal, round, and reactive to light. Conjunctivae, EOM and lids are normal.  Neck: Normal range of motion. Neck supple. No tracheal deviation present. No thyroid mass present.  Cardiovascular: Normal rate, regular rhythm and normal heart sounds.  Exam reveals no gallop.   No murmur heard. Pulmonary/Chest: Effort normal and breath sounds normal. No stridor. No respiratory distress. He has no decreased breath sounds. He has no wheezes. He has no rhonchi. He has no rales.  Abdominal: Soft. Normal appearance and bowel sounds are normal. He exhibits no distension. There is no tenderness. There is no rebound and no CVA  tenderness.  Musculoskeletal: Normal range of motion. He exhibits no edema or tenderness.  Neurological: He is alert and oriented to person, place, and time. He has normal strength. No cranial nerve deficit or sensory deficit. GCS eye subscore is 4. GCS verbal subscore is 5. GCS motor subscore is 6.  Skin: Skin is warm and dry. No abrasion and no rash noted.  Psychiatric: He has a normal mood and affect. His speech is normal and behavior is normal.  Nursing note and vitals reviewed.    ED  Treatments / Results  Labs (all labs ordered are listed, but only abnormal results are displayed) Labs Reviewed  CBC WITH DIFFERENTIAL/PLATELET  BASIC METABOLIC PANEL    EKG  EKG Interpretation  Date/Time:  Friday May 03 2017 07:49:17 EDT Ventricular Rate:  75 PR Interval:    QRS Duration: 89 QT Interval:  366 QTC Calculation: 409 R Axis:   33 Text Interpretation:  Sinus rhythm Probable left atrial enlargement Abnormal R-wave progression, early transition Confirmed by Lorre Nick ([id number]) on 05/03/2017 8:53:16 AM       Radiology No results found.  Procedures Procedures (including critical care time)  Medications Ordered in ED Medications  0.9 %  sodium chloride infusion (not administered)     Initial Impression / Assessment and Plan / ED Course  I have reviewed the triage vital signs and the nursing notes.  Pertinent labs & imaging results that were available during my care of the patient were reviewed by me and considered in my medical decision making (see chart for details).     Patient is yet to fill his prescription for metformin that I gave him several days ago when I saw him.head CT negative. Patient symptoms likely from diabetic neuropathy and I discussed the case with the neurologist on call who agrees.blood pressure noted and will start patient on antihypertensive. He should also given referral to the free clinic which she has not called to schedule that visit. I strongly encouraged him to do this and take his medications as directed.  Final Clinical Impressions(s) / ED Diagnoses   Final diagnoses:  None    New Prescriptions New Prescriptions   No medications on file     Lorre Nick, MD 05/03/17 1022

## 2017-05-03 NOTE — ED Triage Notes (Signed)
Pt reports intermittent numbness to head arm and legs that resolve after 10 to 15 minutes that has been reoccurring x1 week

## 2017-08-20 ENCOUNTER — Encounter (HOSPITAL_COMMUNITY): Payer: Self-pay | Admitting: Emergency Medicine

## 2017-08-20 ENCOUNTER — Emergency Department (HOSPITAL_COMMUNITY)
Admission: EM | Admit: 2017-08-20 | Discharge: 2017-08-20 | Disposition: A | Discharge status: Home / Self Care | Payer: Self-pay | Attending: Emergency Medicine | Admitting: Emergency Medicine

## 2017-08-20 DIAGNOSIS — Z7984 Long term (current) use of oral hypoglycemic drugs: Secondary | ICD-10-CM | POA: Insufficient documentation

## 2017-08-20 DIAGNOSIS — E119 Type 2 diabetes mellitus without complications: Secondary | ICD-10-CM | POA: Insufficient documentation

## 2017-08-20 DIAGNOSIS — K029 Dental caries, unspecified: Secondary | ICD-10-CM | POA: Insufficient documentation

## 2017-08-20 DIAGNOSIS — Z79899 Other long term (current) drug therapy: Secondary | ICD-10-CM | POA: Insufficient documentation

## 2017-08-20 DIAGNOSIS — K047 Periapical abscess without sinus: Secondary | ICD-10-CM | POA: Insufficient documentation

## 2017-08-20 DIAGNOSIS — I10 Essential (primary) hypertension: Secondary | ICD-10-CM | POA: Insufficient documentation

## 2017-08-20 DIAGNOSIS — Z87891 Personal history of nicotine dependence: Secondary | ICD-10-CM | POA: Insufficient documentation

## 2017-08-20 HISTORY — DX: Type 2 diabetes mellitus without complications: E11.9

## 2017-08-20 MED ORDER — METFORMIN HCL 500 MG PO TABS: 500.0000 mg | ORAL_TABLET | Freq: Two times a day (BID) | ORAL | 0 refills | Status: DC

## 2017-08-20 MED ORDER — ACETAMINOPHEN 500 MG PO TABS
1000.0000 mg | ORAL_TABLET | Freq: Once | ORAL | Status: AC
  Administered 2017-08-20: 1000 mg via ORAL
  Filled 2017-08-20: qty 2

## 2017-08-20 MED ORDER — AMOXICILLIN-POT CLAVULANATE 875-125 MG PO TABS
1.0000 | ORAL_TABLET | Freq: Once | ORAL | Status: AC
  Administered 2017-08-20: 1 via ORAL
  Filled 2017-08-20: qty 1

## 2017-08-20 MED ORDER — AMOXICILLIN-POT CLAVULANATE 875-125 MG PO TABS: 1.0000 | ORAL_TABLET | Freq: Two times a day (BID) | ORAL | 0 refills | Status: DC

## 2017-08-20 NOTE — Discharge Instructions (Signed)
Please use the provided resources to follow-up with a dentist. Take all medication as directed and return here for concerning changes in your condition.  In addition to the prescribed antibiotic, please obtain a oral antiseptic mouthwash. You may discuss this with the pharmacist at your drugstore.

## 2017-08-20 NOTE — ED Triage Notes (Signed)
Patient c/o right lower dental abscess with swelling over the past couple days.

## 2017-08-20 NOTE — ED Provider Notes (Signed)
[facility]-EMERGENCY DEPT Provider Note   CSN: [id number] Arrival date & time: 08/20/17  [id number]     History   Chief Complaint Chief Complaint  Patient presents with  . Oral Swelling    HPI Kevin [name] is a 51 y.o. male.  HPI Patient presents with concern of right facial pain and swelling. Episode began possibly 3 or 4 days ago, since onset has been persistent, getting worse. The pain is severe, sore. Pain is minimally improved with warm compresses. No new fever, no new vomiting, no new difficulty speaking or swallowing. Patient has known poor dentition, has not seen a dentist in approximately 10 years.  Past Medical History:  Diagnosis Date  . Diabetes mellitus without complication (HCC)   . Hypertension   . MRSA (methicillin resistant Staphylococcus aureus)     There are no active problems to display for this patient.   Past Surgical History:  Procedure Laterality Date  . BACK SURGERY    . LAPAROSCOPIC GASTRIC BANDING         Home Medications    Prior to Admission medications   Medication Sig Start Date End Date Taking? Authorizing Provider  acetaminophen (TYLENOL) 500 MG tablet Take 1,500-2,000 mg by mouth every 6 (six) hours as needed for mild pain, moderate pain, fever or headache.     [provider]  amLODipine (NORVASC) 5 MG tablet Take 1 tablet (5 mg total) by mouth daily. 05/03/17   Lorre NickAllen, Anthony, MD  doxycycline (VIBRAMYCIN) 100 MG capsule Take 1 capsule (100 mg total) by mouth 2 (two) times daily. Patient not taking: Reported on 04/23/2017 04/04/17   Garlon HatchetSanders, Lisa M, PA-C  metFORMIN (GLUCOPHAGE) 500 MG tablet Take 1 tablet (500 mg total) by mouth 2 (two) times daily with a meal. Patient not taking: Reported on 05/03/2017 04/23/17   Lorre NickAllen, Anthony, MD  omeprazole (PRILOSEC) 40 MG capsule Take 40 mg by mouth daily.    [provider]  oxyCODONE-acetaminophen (PERCOCET/ROXICET) 5-325 MG tablet Take 1-2 tablets by mouth  every 4 (four) hours as needed for severe pain. Patient not taking: Reported on 04/04/2017 02/28/17   Elpidio AnisUpstill, Shari, PA-C    Family History No family history on file.  Social History Social History   Tobacco Use  . Smoking status: Former Games developermoker  . Smokeless tobacco: Never Used  Substance Use Topics  . Alcohol use: No  . Drug use: No     Allergies   Patient has no known allergies.   Review of Systems Review of Systems  Constitutional:       Per HPI, otherwise negative  HENT:       Per HPI, otherwise negative  Respiratory:       Per HPI, otherwise negative  Cardiovascular:       Per HPI, otherwise negative  Gastrointestinal: Positive for nausea. Negative for vomiting.       Prior lap band procedure  Endocrine:       Negative aside from HPI  Genitourinary:       Neg aside from HPI   Musculoskeletal:       Per HPI, otherwise negative  Skin: Negative.   Neurological: Negative for syncope.     Physical Exam Updated Vital Signs BP (!) 185/118 (BP Location: Left Arm)   Pulse (!) 104   Temp 98.3 F (36.8 C) (Oral)   Resp 15   Ht 5\' 7"  (1.702 m)   Wt 81.6 kg (180 lb)   SpO2 100%   BMI  28.19 kg/m   Physical Exam  Constitutional: He is oriented to person, place, and time. He appears well-developed. No distress.  HENT:  Head: Normocephalic and atraumatic.  Mouth/Throat: Uvula is midline and oropharynx is clear and moist. Abnormal dentition. Dental caries present.    Eyes: Conjunctivae and EOM are normal.  Cardiovascular: Normal rate and regular rhythm.  Pulmonary/Chest: Effort normal. No stridor. No respiratory distress.  Abdominal: He exhibits no distension.  Musculoskeletal: He exhibits no edema.  Neurological: He is alert and oriented to person, place, and time.  Skin: Skin is warm and dry.  Psychiatric: He has a normal mood and affect.  Nursing note and vitals reviewed.    ED Treatments / Results   Procedures Procedures (including critical care  time)  Medications Ordered in ED Medications  amoxicillin-clavulanate (AUGMENTIN) 875-125 MG per tablet 1 tablet (not administered)  acetaminophen (TYLENOL) tablet 1,000 mg (not administered)     Initial Impression / Assessment and Plan / ED Course  I have reviewed the triage vital signs and the nursing notes.  Pertinent labs & imaging results that were available during my care of the patient were reviewed by me and considered in my medical decision making (see chart for details).  Presents with facial pain, has evidence for early infection of an abscessed tooth on the right. No evidence for posterior oral pharyngeal swelling, no evidence for bacteremia, sepsis, respiratory compromise. Patient started on antibiotics, Tylenol, provided a metformin refill, per request as well as dental resources.  Final Clinical Impressions(s) / ED Diagnoses  Dental infection   Gerhard MunchLockwood, See Beharry, MD 08/20/17 1026

## 2017-09-25 ENCOUNTER — Encounter (HOSPITAL_COMMUNITY): Payer: Self-pay | Admitting: Emergency Medicine

## 2017-09-25 ENCOUNTER — Ambulatory Visit (HOSPITAL_COMMUNITY)
Admission: EM | Admit: 2017-09-25 | Discharge: 2017-09-25 | Disposition: A | Discharge status: Home / Self Care | Payer: Self-pay | Attending: Family Medicine | Admitting: Family Medicine

## 2017-09-25 ENCOUNTER — Other Ambulatory Visit: Payer: Self-pay

## 2017-09-25 DIAGNOSIS — E119 Type 2 diabetes mellitus without complications: Secondary | ICD-10-CM

## 2017-09-25 DIAGNOSIS — Z760 Encounter for issue of repeat prescription: Secondary | ICD-10-CM

## 2017-09-25 DIAGNOSIS — I10 Essential (primary) hypertension: Secondary | ICD-10-CM

## 2017-09-25 LAB — GLUCOSE, CAPILLARY: Glucose-Capillary: 247 mg/dL — ABNORMAL HIGH (ref 65–99)

## 2017-09-25 MED ORDER — METFORMIN HCL 1000 MG PO TABS: 1000.0000 mg | ORAL_TABLET | Freq: Two times a day (BID) | ORAL | 2 refills | Status: DC

## 2017-09-25 MED ORDER — ENALAPRIL MALEATE 10 MG PO TABS: 5.0000 mg | ORAL_TABLET | Freq: Every day | ORAL | 2 refills | Status: DC

## 2017-09-25 NOTE — ED Triage Notes (Addendum)
Pt took his last Metformin this morning and does not have a PCP to get a refill.  Pt states he is working on trying to find a PCP that he can afford.  Pt was prescribed Norvasc for his BP back in August but never filled the Rx.  Pt has very high BP today.

## 2017-09-25 NOTE — Discharge Instructions (Addendum)
Please follow up in three weeks for a blood pressure check. You will need your kidney function (creatinine) checked also at that time.

## 2017-09-25 NOTE — ED Provider Notes (Signed)
[facility]   [id number] 09/25/17 Arrival Time: 1038  ASSESSMENT & PLAN:  1. Medication refill   2. Type 2 diabetes mellitus without complication, without long-term current use of insulin (HCC)   3. Essential hypertension     Meds ordered this encounter  Medications  . metFORMIN (GLUCOPHAGE) 1000 MG tablet    Sig: Take 1 tablet (1,000 mg total) by mouth 2 (two) times daily.    Dispense:  60 tablet    Refill:  2  . enalapril (VASOTEC) 10 MG tablet    Sig: Take 0.5 tablets (5 mg total) by mouth daily.    Dispense:  30 tablet    Refill:  2   Information given re: establishing care with PCP. Agrees to f/u here in 3 weeks for BP recheck and Cr level (declines today secondary to monetary reasons). May f/u sooner if needed.  Reviewed expectations re: course of current medical issues. Questions answered. Outlined signs and symptoms indicating need for more acute intervention. Patient verbalized understanding. After Visit Summary given.   SUBJECTIVE: History from: patient. Kevin [name] is a 52 y.o. male with a h/o DM and HTN. On Metformin for years 500mg  BID. Requests refill. Working on finding a PCP. No visual changes, excessive thrist. No urinary symptoms. No extremity sensation changes or weakness. Watches his diet. Has been on BP medications in the past but not for the past year at least.  Significant FH of DM and cardiac disease.  Social History   Tobacco Use  Smoking Status Former Smoker  Smokeless Tobacco Never Used    ROS: As per HPI.   OBJECTIVE:  Vitals:   09/25/17 1058  BP: (!) 177/125  Pulse: (!) 110  Temp: 98.4 F (36.9 C)  TempSrc: Oral  SpO2: 97%    Recheck pulse: 98  General appearance: alert; no distress Eyes: PERRLA; EOMI; conjunctiva normal Lungs: clear to auscultation bilaterally Heart: regular rate and rhythm Abdomen: soft, non-tender; bowel sounds normal Extremities: no cyanosis or edema Skin: warm and dry Neurologic: normal  gait; normal symmetric reflexes Psychological: alert and cooperative; normal mood and affect  Labs: Results for orders placed or performed during the hospital encounter of 09/25/17  Glucose, capillary  Result Value Ref Range   Glucose-Capillary 247 (H) 65 - 99 mg/dL   No Known Allergies  Past Medical History:  Diagnosis Date  . Diabetes mellitus without complication (HCC)   . Hypertension   . MRSA (methicillin resistant Staphylococcus aureus)    Social History   Socioeconomic History  . Marital status: Divorced    Spouse name: Not on file  . Number of children: Not on file  . Years of education: Not on file  . Highest education level: Not on file  Social Needs  . Financial resource strain: Not on file  . Food insecurity - worry: Not on file  . Food insecurity - inability: Not on file  . Transportation needs - medical: Not on file  . Transportation needs - non-medical: Not on file  Occupational History  . Not on file  Tobacco Use  . Smoking status: Former Games developermoker  . Smokeless tobacco: Never Used  Substance and Sexual Activity  . Alcohol use: No  . Drug use: No  . Sexual activity: Not on file  Other Topics Concern  . Not on file  Social History Narrative  . Not on file    Past Surgical History:  Procedure Laterality Date  . BACK SURGERY    . LAPAROSCOPIC GASTRIC  Domingo Pulse, MD 09/25/17 1153

## 2017-09-26 ENCOUNTER — Other Ambulatory Visit: Payer: Self-pay

## 2017-09-26 ENCOUNTER — Encounter (HOSPITAL_COMMUNITY): Payer: Self-pay

## 2017-09-26 ENCOUNTER — Emergency Department (HOSPITAL_COMMUNITY)
Admission: EM | Admit: 2017-09-26 | Discharge: 2017-09-26 | Disposition: A | Discharge status: Home / Self Care | Payer: Self-pay | Attending: Emergency Medicine | Admitting: Emergency Medicine

## 2017-09-26 DIAGNOSIS — H53453 Other localized visual field defect, bilateral: Secondary | ICD-10-CM | POA: Insufficient documentation

## 2017-09-26 DIAGNOSIS — Z79899 Other long term (current) drug therapy: Secondary | ICD-10-CM | POA: Insufficient documentation

## 2017-09-26 DIAGNOSIS — Z7984 Long term (current) use of oral hypoglycemic drugs: Secondary | ICD-10-CM | POA: Insufficient documentation

## 2017-09-26 DIAGNOSIS — I10 Essential (primary) hypertension: Secondary | ICD-10-CM | POA: Insufficient documentation

## 2017-09-26 DIAGNOSIS — H5340 Unspecified visual field defects: Secondary | ICD-10-CM

## 2017-09-26 DIAGNOSIS — E119 Type 2 diabetes mellitus without complications: Secondary | ICD-10-CM | POA: Insufficient documentation

## 2017-09-26 DIAGNOSIS — Z87891 Personal history of nicotine dependence: Secondary | ICD-10-CM | POA: Insufficient documentation

## 2017-09-26 LAB — CBG MONITORING, ED: GLUCOSE-CAPILLARY: 215 mg/dL — AB (ref 65–99)

## 2017-09-26 NOTE — Discharge Instructions (Signed)
Go to Dr. Laruth BouchardGroat's office now to be seen

## 2017-09-26 NOTE — ED Triage Notes (Signed)
Pt describes seeing "bright spots" in his visual field starting last night. Pt denies recent head injury. Pt reports 4/10 H/A at this time. No facial droop present. Pt speaking clearly in complete sentences. A+OX4.

## 2017-09-26 NOTE — ED Provider Notes (Signed)
[facility]EMERGENCY DEPT Provider Note   CSN: [id number] Arrival date & time: 09/26/17  0803     History   Chief Complaint Chief Complaint  Patient presents with  . Visual Field Change    HPI Kevin [name] is a 52 y.o. male.  The history is provided by the patient. No language interpreter was used.  Eye Problem   This is a new problem. The current episode started 12 to 24 hours ago. The problem occurs constantly. Progression since onset: comes and goes. There is a problem in both eyes. There was no injury mechanism. The pain is moderate. There is no history of trauma to the eye. Associated symptoms include decreased vision. He has tried nothing for the symptoms.  Pt reports last night he had loss of vision in the outer aspect of his right eye.  Pt reports he went to sleep and it went away.  Pt reports today he sees little sparks like fireworks.  Pt reports today he noticed a dark spot in the middle of his visual field. Pt reports he has multiple dark lines,   Past Medical History:  Diagnosis Date  . Diabetes mellitus without complication (HCC)   . Hypertension   . MRSA (methicillin resistant Staphylococcus aureus)     There are no active problems to display for this patient.   Past Surgical History:  Procedure Laterality Date  . BACK SURGERY    . LAPAROSCOPIC GASTRIC BANDING         Home Medications    Prior to Admission medications   Medication Sig Start Date End Date Taking? Authorizing Provider  acetaminophen (TYLENOL) 500 MG tablet Take 4,000 mg by mouth. EVERY 3 TO 4 HOURS AS NEEDED FOR TOOTHACHE PAIN   Yes [provider]  enalapril (VASOTEC) 10 MG tablet Take 0.5 tablets (5 mg total) by mouth daily. 09/25/17  Yes Hagler, Arlys John, MD  ibuprofen (ADVIL,MOTRIN) 200 MG tablet Take 200 mg by mouth daily as needed (TOOTHACHE PAIN).   Yes [provider]  metFORMIN (GLUCOPHAGE) 1000 MG tablet Take 1 tablet (1,000 mg total) by mouth  2 (two) times daily. 09/25/17  Yes Hagler, Arlys John, MD  omeprazole (PRILOSEC) 40 MG capsule Take 40 mg by mouth 2 (two) times daily.    Yes [provider]    Family History History reviewed. No pertinent family history.  Social History Social History   Tobacco Use  . Smoking status: Former Games developer  . Smokeless tobacco: Never Used  Substance Use Topics  . Alcohol use: No  . Drug use: No     Allergies   Patient has no known allergies.   Review of Systems Review of Systems  All other systems reviewed and are negative.    Physical Exam Updated Vital Signs BP (!) 159/106 (BP Location: Left Arm)   Pulse 97   Temp 98.6 F (37 C) (Oral)   Resp 18   Ht 5\' 7"  (1.702 m)   Wt 81.6 kg (180 lb)   SpO2 99%   BMI 28.19 kg/m   Physical Exam  Constitutional: He is oriented to person, place, and time. He appears well-developed.  HENT:  Head: Normocephalic and atraumatic.  Eyes: Conjunctivae and EOM are normal. Pupils are equal, round, and reactive to light. Right eye exhibits no discharge. Left eye exhibits no discharge.  Musculoskeletal: Normal range of motion.  Neurological: He is alert and oriented to person, place, and time.  Skin: Skin is warm.  Psychiatric: He has  a normal mood and affect.  Nursing note and vitals reviewed.    ED Treatments / Results  Labs (all labs ordered are listed, but only abnormal results are displayed) Labs Reviewed  CBG MONITORING, ED    EKG  EKG Interpretation None       Radiology No results found.  Procedures Procedures (including critical care time)  Medications Ordered in ED Medications - No data to display   Initial Impression / Assessment and Plan / ED Course  I have reviewed the triage vital signs and the nursing notes.  Pertinent labs & imaging results that were available during my care of the patient were reviewed by me and considered in my medical decision making (see chart for details).     Call to  Opthomology  Dr. Dione BoozeGroat will see pt in his office for evaluation.  Final Clinical Impressions(s) / ED Diagnoses   Final diagnoses:  Visual field defect    ED Discharge Orders    None       Osie CheeksSofia, Leslie K, PA-C 09/26/17 1418    Gerhard MunchLockwood, Robert, MD 09/26/17 [phone]

## 2017-10-23 ENCOUNTER — Ambulatory Visit: Payer: Self-pay | Attending: Nurse Practitioner | Admitting: Nurse Practitioner

## 2017-10-23 ENCOUNTER — Encounter: Payer: Self-pay | Admitting: Nurse Practitioner

## 2017-10-23 VITALS — BP 157/103 | HR 73 | Temp 98.4°F | Ht 67.0 in | Wt 184.8 lb

## 2017-10-23 DIAGNOSIS — Z9119 Patient's noncompliance with other medical treatment and regimen: Secondary | ICD-10-CM | POA: Insufficient documentation

## 2017-10-23 DIAGNOSIS — E119 Type 2 diabetes mellitus without complications: Secondary | ICD-10-CM | POA: Insufficient documentation

## 2017-10-23 DIAGNOSIS — E118 Type 2 diabetes mellitus with unspecified complications: Secondary | ICD-10-CM

## 2017-10-23 DIAGNOSIS — Z7984 Long term (current) use of oral hypoglycemic drugs: Secondary | ICD-10-CM | POA: Insufficient documentation

## 2017-10-23 DIAGNOSIS — E785 Hyperlipidemia, unspecified: Secondary | ICD-10-CM | POA: Insufficient documentation

## 2017-10-23 DIAGNOSIS — I10 Essential (primary) hypertension: Secondary | ICD-10-CM | POA: Insufficient documentation

## 2017-10-23 DIAGNOSIS — E1169 Type 2 diabetes mellitus with other specified complication: Secondary | ICD-10-CM

## 2017-10-23 LAB — GLUCOSE, POCT (MANUAL RESULT ENTRY): POC Glucose: 199 mg/dl — AB (ref 70–99)

## 2017-10-23 MED ORDER — TRUE METRIX METER W/DEVICE KIT: PACK | 0 refills | Status: DC

## 2017-10-23 MED ORDER — CLONIDINE HCL 0.1 MG PO TABS
0.1000 mg | ORAL_TABLET | Freq: Once | ORAL | Status: AC
  Administered 2017-10-23: 0.1 mg via ORAL

## 2017-10-23 MED ORDER — GLUCOSE BLOOD VI STRP: ORAL_STRIP | 12 refills | Status: DC

## 2017-10-23 MED ORDER — TRUEPLUS LANCETS 28G MISC: 3 refills | Status: DC

## 2017-10-23 MED FILL — TRUEplus LANCETS 28G MISC: 30 days supply | Qty: 100 | Fill #0

## 2017-10-23 MED FILL — TRUE METRIX TEST STRIP: 30 days supply | Qty: 100 | Fill #0

## 2017-10-23 MED FILL — !TRUE METRIX BLOOD GLUCOSE: 365 days supply | Qty: 1 | Fill #0

## 2017-10-23 NOTE — Progress Notes (Signed)
Assessment & Plan:  Kevin [name] was seen today for new patient (initial visit).  Diagnoses and all orders for this visit:  Essential hypertension -     cloNIDine (CATAPRES) tablet 0.1 mg -     enalapril (VASOTEC) 10 MG tablet; Take 1 tablet (10 mg total) by mouth daily. Continue all antihypertensives as prescribed.  Remember to bring in your blood pressure log with you for your follow up appointment.  DASH/Mediterranean Diets are healthier choices for HTN.    Type 2 diabetes mellitus with complication, without long-term current use of insulin (HCC) -     Glucose (CBG) -     HgB A1c -     TRUEPLUS LANCETS 28G MISC; Use as instructed -     Blood Glucose Monitoring Suppl (TRUE METRIX METER) w/Device KIT; Use as instructed -     glucose blood (TRUE METRIX BLOOD GLUCOSE TEST) test strip; Use as instructed -     Basic metabolic panel -     CBC -     Lipid panel -     Microalbumin / creatinine urine ratio -     TSH Diabetes is poorly controlled. Advised patient to keep a fasting blood sugar log fast, 2 hours post lunch and bedtime which will be reviewed at the next office visit.  Hyperlipidemia associated with type 2 diabetes mellitus (HCC) -     atorvastatin (LIPITOR) 20 MG tablet; Take 1 tablet (20 mg total) by mouth daily.  Work on a low fat, heart healthy diet and participate in regular aerobic exercise program to control as well by working out at least 150 minutes per week. No fried foods. No junk foods, sodas, sugary drinks, unhealthy snacking, or smoking.   Patient has been counseled on age-appropriate routine health concerns for screening and prevention. These are reviewed and up-to-date. Referrals have been placed accordingly. Immunizations are up-to-date or declined.    Subjective:   Chief Complaint  Patient presents with  . New Patient (Initial Visit)    Patient is here to establish care for diabetes.    HPI Kevin [name] 52 y.o. male presents to office today to establish  care. 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   Essential Hypertension He endorses chronic hypertension. He has been on multiple medications in the past with difficulty controlling his blood pressure. He took HCTZ in the past and reports he did not like the way it made him feel. He required clonidine today here in the office for poorly controlled HTN. He has not been compliant with taking his enalapril 21m daily. We discussed what his goal BP should be 130/80 or less and complications that can occur with poorly controlled diabetes. He verbalized understanding. Denies chest pain, shortness of breath, palpitations, lightheadedness, dizziness, headaches or BLE edema.  BP Readings from Last 3 Encounters:  10/23/17 (!) 157/103  09/26/17 (!) 133/97  09/25/17 (!) 177/125    Diabetes Mellitus He is not checking his blood sugars. Reports he does not have a meter. He is walking 1-3 miles per day. He states "I do what I can" in regards to diet. He endorses medication compliance. Taking metformin 20021mdaily.  He had prior lap band surgery and lost over 100 lbs. He can not recall what his last A1c was. [facility] Glucose today is 199.   Review of Systems  Constitutional: Negative for fever, malaise/fatigue and weight loss.  HENT: Negative.  Negative for nosebleeds.   Eyes: Negative.  Negative for blurred vision, double vision and photophobia.  Respiratory: Negative.  Negative for cough and shortness of breath.   Cardiovascular: Negative.  Negative for chest pain, palpitations and leg swelling.  Gastrointestinal: Negative.  Negative for abdominal pain, constipation, diarrhea, heartburn, nausea and vomiting.  Musculoskeletal: Positive for back pain. Negative for myalgias.  Neurological: Negative.  Negative for dizziness, focal weakness, seizures and headaches.  Endo/Heme/Allergies: Negative for environmental  allergies.  Psychiatric/Behavioral: Negative.  Negative for suicidal ideas.    Past Medical History:  Diagnosis Date  . Diabetes mellitus without complication ([place])   . Hypertension   . MRSA (methicillin resistant Staphylococcus aureus)     Past Surgical History:  Procedure Laterality Date  . BACK SURGERY    . LAPAROSCOPIC GASTRIC BANDING      Family History  Problem Relation Age of Onset  . Diabetes Sister   . Stroke Brother   . Diabetes Brother     Social History Reviewed with no changes to be made today.   Outpatient Medications Prior to Visit  Medication Sig Dispense Refill  . acetaminophen (TYLENOL) 500 MG tablet Take 4,000 mg by mouth. EVERY 3 TO 4 HOURS AS NEEDED FOR TOOTHACHE PAIN    . metFORMIN (GLUCOPHAGE) 1000 MG tablet Take 1 tablet (1,000 mg total) by mouth 2 (two) times daily. 60 tablet 2  . omeprazole (PRILOSEC) 40 MG capsule Take 40 mg by mouth 2 (two) times daily.     . enalapril (VASOTEC) 10 MG tablet Take 0.5 tablets (5 mg total) by mouth daily. 30 tablet 2  . ibuprofen (ADVIL,MOTRIN) 200 MG tablet Take 200 mg by mouth daily as needed (TOOTHACHE PAIN).     No facility-administered medications prior to visit.     No Known Allergies     Objective:    BP (!) 157/103 (BP Location: Left Arm, Cuff Size: Normal)   Pulse 73   Temp 98.4 F (36.9 C) (Oral)   Ht _0  (1.702 m)   Wt 184 lb 12.8 oz (83.8 kg)   SpO2 96%   BMI 28.94 kg/m  Wt Readings from Last 3 Encounters:  10/23/17 184 lb 12.8 oz (83.8 kg)  09/26/17 180 lb (81.6 kg)  08/20/17 180 lb (81.6 kg)    Physical Exam  Constitutional: He is oriented to person, place, and time. He appears well-developed and well-nourished. He is cooperative.  HENT:  Head: Normocephalic and atraumatic.  Eyes: EOM are normal.  Neck: Normal range of motion.  Cardiovascular: Normal rate, regular rhythm, normal heart sounds and intact distal pulses. Exam reveals no gallop and no friction rub.  No murmur  heard. Pulmonary/Chest: Effort normal and breath sounds normal. No tachypnea. No respiratory distress. He has no decreased breath sounds. He has no wheezes. He has no rhonchi. He has no rales. He exhibits no tenderness.  Abdominal: Soft. Bowel sounds are normal.  Musculoskeletal: Normal range of motion. He exhibits no edema.  Neurological: He is alert and oriented to person, place, and time. Coordination normal.  Skin: Skin is warm and dry.  Psychiatric: He has a normal mood and affect. His behavior is normal. Judgment and thought content normal.  Nursing note and vitals reviewed.      Patient has been counseled extensively about nutrition and exercise as well as the importance of adherence with medications and regular follow-up. The patient was given clear instructions to go to ER or return to medical center  if symptoms don't improve, worsen or new problems develop. The patient verbalized understanding.   Follow-up: Return in about 2 weeks (around 11/06/2017) for BP recheck and DM f/u.   [name], FNP-BC [facility] [place], [place]   10/27/2017, 5:27 PM

## 2017-10-23 NOTE — Progress Notes (Signed)
c 

## 2017-10-23 NOTE — Patient Instructions (Addendum)
Diabetes blood sugar goals  Fasting in AM before breakfast which means at least 8 hrs of no eating or drinking) except water or unsweetened coffee or tea): 90-130 2 hrs after meals: < 180,   Hypoglycemia or low blood sugar: < 70 (You should not have hypoglycemia.)  Aim for 30 minutes of exercise most days. Rethink what you drink. Water is great! Aim for 2-3 Carb Choices per meal (30-45 grams) +/- 1 either way  Aim for 0-15 Carbs per snack if hungry  Include protein in moderation with your meals and snacks  Consider reading food labels for Total Carbohydrate and Fat Grams of foods  Consider checking BG at alternate times per day  Continue taking medication as directed Be mindful about how much sugar you are adding to beverages and other foods. Fruit Punch - find one with no sugar  Measure and decrease portions of carbohydrate foods  Make your plate and don't go back for seconds  Diabetes Mellitus and Nutrition When you have diabetes (diabetes mellitus), it is very important to have healthy eating habits because your blood sugar (glucose) levels are greatly affected by what you eat and drink. Eating healthy foods in the appropriate amounts, at about the same times every day, can help you:  Control your blood glucose.  Lower your risk of heart disease.  Improve your blood pressure.  Reach or maintain a healthy weight.  Every person with diabetes is different, and each person has different needs for a meal plan. Your health care provider may recommend that you work with a diet and nutrition specialist (dietitian) to make a meal plan that is best for you. Your meal plan may vary depending on factors such as:  The calories you need.  The medicines you take.  Your weight.  Your blood glucose, blood pressure, and cholesterol levels.  Your activity level.  Other health conditions you have, such as heart or kidney disease.  How do carbohydrates affect me? Carbohydrates  affect your blood glucose level more than any other type of food. Eating carbohydrates naturally increases the amount of glucose in your blood. Carbohydrate counting is a method for keeping track of how many carbohydrates you eat. Counting carbohydrates is important to keep your blood glucose at a healthy level, especially if you use insulin or take certain oral diabetes medicines. It is important to know how many carbohydrates you can safely have in each meal. This is different for every person. Your dietitian can help you calculate how many carbohydrates you should have at each meal and for snack. Foods that contain carbohydrates include:  Bread, cereal, rice, pasta, and crackers.  Potatoes and corn.  Peas, beans, and lentils.  Milk and yogurt.  Fruit and juice.  Desserts, such as cakes, cookies, ice cream, and candy.  How does alcohol affect me? Alcohol can cause a sudden decrease in blood glucose (hypoglycemia), especially if you use insulin or take certain oral diabetes medicines. Hypoglycemia can be a life-threatening condition. Symptoms of hypoglycemia (sleepiness, dizziness, and confusion) are similar to symptoms of having too much alcohol. If your health care provider says that alcohol is safe for you, follow these guidelines:  Limit alcohol intake to no more than 1 drink per day for nonpregnant women and 2 drinks per day for men. One drink equals 12 oz of beer, 5 oz of wine, or 1 oz of hard liquor.  Do not drink on an empty stomach.  Keep yourself hydrated with water, diet soda, or unsweetened  iced tea.  Keep in mind that regular soda, juice, and other mixers may contain a lot of sugar and must be counted as carbohydrates.  What are tips for following this plan? Reading food labels  Start by checking the serving size on the label. The amount of calories, carbohydrates, fats, and other nutrients listed on the label are based on one serving of the food. Many foods contain more  than one serving per package.  Check the total grams (g) of carbohydrates in one serving. You can calculate the number of servings of carbohydrates in one serving by dividing the total carbohydrates by 15. For example, if a food has 30 g of total carbohydrates, it would be equal to 2 servings of carbohydrates.  Check the number of grams (g) of saturated and trans fats in one serving. Choose foods that have low or no amount of these fats.  Check the number of milligrams (mg) of sodium in one serving. Most people should limit total sodium intake to less than 2,300 mg per day.  Always check the nutrition information of foods labeled as "low-fat" or "nonfat". These foods may be higher in added sugar or refined carbohydrates and should be avoided.  Talk to your dietitian to identify your daily goals for nutrients listed on the label. Shopping  Avoid buying canned, premade, or processed foods. These foods tend to be high in fat, sodium, and added sugar.  Shop around the outside edge of the grocery store. This includes fresh fruits and vegetables, bulk grains, fresh meats, and fresh dairy. Cooking  Use low-heat cooking methods, such as baking, instead of high-heat cooking methods like deep frying.  Cook using healthy oils, such as olive, canola, or sunflower oil.  Avoid cooking with butter, cream, or high-fat meats. Meal planning  Eat meals and snacks regularly, preferably at the same times every day. Avoid going long periods of time without eating.  Eat foods high in fiber, such as fresh fruits, vegetables, beans, and whole grains. Talk to your dietitian about how many servings of carbohydrates you can eat at each meal.  Eat 4-6 ounces of lean protein each day, such as lean meat, chicken, fish, eggs, or tofu. 1 ounce is equal to 1 ounce of meat, chicken, or fish, 1 egg, or 1/4 cup of tofu.  Eat some foods each day that contain healthy fats, such as avocado, nuts, seeds, and  fish. Lifestyle   Check your blood glucose regularly.  Exercise at least 30 minutes 5 or more days each week, or as told by your health care provider.  Take medicines as told by your health care provider.  Do not use any products that contain nicotine or tobacco, such as cigarettes and e-cigarettes. If you need help quitting, ask your health care provider.  Work with a Veterinary surgeon or diabetes educator to identify strategies to manage stress and any emotional and social challenges. What are some questions to ask my health care provider?  Do I need to meet with a diabetes educator?  Do I need to meet with a dietitian?  What number can I call if I have questions?  When are the best times to check my blood glucose? Where to find more information:  American Diabetes Association: diabetes.org/food-and-fitness/food  Academy of Nutrition and Dietetics: https://www.vargas.com/  [employer] of Diabetes and Digestive and Kidney Diseases (NIH): FindJewelers.cz Summary  A healthy meal plan will help you control your blood glucose and maintain a healthy lifestyle.  Working with a diet  and nutrition specialist (dietitian) can help you make a meal plan that is best for you.  Keep in mind that carbohydrates and alcohol have immediate effects on your blood glucose levels. It is important to count carbohydrates and to use alcohol carefully. This information is not intended to replace advice given to you by your health care provider. Make sure you discuss any questions you have with your health care provider. Document Released: 05/24/2005 Document Revised: 10/01/2016 Document Reviewed: 10/01/2016 Elsevier Interactive Patient Education  2018 [employer].  How to Avoid Diabetes Mellitus Problems You can take action to prevent or slow down problems that are caused by diabetes  (diabetes mellitus). Following your diabetes plan and taking care of yourself can reduce your risk of serious or life-threatening complications. Manage your diabetes  Follow instructions from your health care providers about managing your diabetes. Your diabetes may be managed by a team of health care providers who can teach you how to care for yourself and can answer questions that you have.  Educate yourself about your condition so you can make healthy choices about eating and physical activity.  Check your blood sugar (glucose) levels as often as directed. Your health care provider will help you decide how often to check your blood glucose level depending on your treatment goals and how well you are meeting them.  Ask your health care provider if you should take low-dose aspirin daily and what dose is recommended for you. Taking low-dose aspirin daily is recommended to help prevent cardiovascular disease. Do not use nicotine or tobacco Do not use any products that contain nicotine or tobacco, such as cigarettes and e-cigarettes. If you need help quitting, ask your health care provider. Nicotine raises your risk for diabetes problems. If you quit using nicotine:  You will lower your risk for heart attack, stroke, nerve disease, and kidney disease.  Your cholesterol and blood pressure may improve.  Your blood circulation will improve.  Keep your blood pressure under control To control your blood pressure:  Follow instructions from your health care provider about meal planning, exercise, and medicines.  Make sure your health care provider checks your blood pressure at every medical visit.  A blood pressure reading consists of two numbers. Generally, the goal is to keep your top number (systolic pressure) at or below 130, and your bottom number (diastolic pressure) at or below 80. Your health care provider may recommend a lower target blood pressure. Your individualized target blood pressure  is determined based on:  Your age.  Your medicines.  How long you have had diabetes.  Any other medical conditions you have.  Keep your cholesterol under control To control your cholesterol:  Follow instructions from your health care provider about meal planning, exercise, and medicines.  Have your cholesterol checked at least once a year.  You may be prescribed medicine to lower cholesterol (statin). If you are not taking a statin, ask your health care provider if you should be.  Controlling your cholesterol may:  Help prevent heart disease and stroke. These are the most common health problems for people with diabetes.  Improve your blood flow.  Schedule and keep yearly physical exams and eye exams Your health care provider will tell you how often you need medical visits depending on your diabetes management plan. Keep all follow-up visits as directed. This is important so possible problems can be identified early and complications can be avoided or treated.  Every visit with your health care provider should include  measuring your: ? Weight. ? Blood pressure. ? Blood glucose control.  Your A1c (hemoglobin A1c) level should be checked: ? At least 2 times a year, if you are meeting your treatment goals. ? 4 times a year, if you are not meeting treatment goals or if your treatment goals have changed.  Your blood lipids (lipid profile) should be checked yearly. You should also be checked yearly for protein in your urine (urine microalbumin).  If you have type 1 diabetes, get an eye exam 3-5 years after you are diagnosed, and then once a year after your first exam.  If you have type 2 diabetes, get an eye exam as soon as you are diagnosed, and then once a year after your first exam.  Keep your vaccines current It is recommended that you receive:  A flu (influenza) vaccine every year.  A pneumonia (pneumococcal) vaccine and a hepatitis B vaccine. If you are age 52 or older,  you may get the pneumonia vaccine as a series of two separate shots.  Ask your health care provider which other vaccines may be recommended. Take care of your feet Diabetes may cause you to have poor blood circulation to your legs and feet. Because of this, taking care of your feet is very important. Diabetes can cause:  The skin on the feet to get thinner, break more easily, and heal more slowly.  Nerve damage in your legs and feet, which results in decreased feeling. You may not notice minor injuries that could lead to serious problems.  To avoid foot problems:  Check your skin and feet every day for cuts, bruises, redness, blisters, or sores.  Schedule a foot exam with your health care provider once every year. This exam includes: ? Inspecting of the structure and skin of your feet. ? Checking the pulses and sensation in your feet.  Make sure that your health care provider performs a visual foot exam at every medical visit.  Take care of your teeth People with poorly controlled diabetes are more likely to have gum (periodontal) disease. Diabetes can make periodontal diseases harder to control. If not treated, periodontal diseases can lead to tooth loss. To prevent this:  Brush your teeth twice a day.  Floss at least once a day.  Visit your dentist 2 times a year.  Drink responsibly Limit alcohol intake to no more than 1 drink a day for nonpregnant women and 2 drinks a day for men. One drink equals 12 oz of beer, 5 oz of wine, or 1 oz of hard liquor. It is important to eat food when you drink alcohol to avoid low blood glucose (hypoglycemia). Avoid alcohol if you:  Have a history of alcohol abuse or dependence.  Are pregnant.  Have liver disease, pancreatitis, advanced neuropathy, or severe hypertriglyceridemia.  Lessen stress Living with diabetes can be stressful. When you are experiencing stress, your blood glucose may be affected in two ways:  Stress hormones may cause  your blood glucose to rise.  You may be distracted from taking good care of yourself.  Be aware of your stress level and make changes to help you manage challenging situations. To lower your stress levels:  Consider joining a support group.  Do planned relaxation or meditation.  Do a hobby that you enjoy.  Maintain healthy relationships.  Exercise regularly.  Work with your health care provider or a mental health professional.  Summary  You can take action to prevent or slow down problems that are caused  by diabetes (diabetes mellitus). Following your diabetes plan and taking care of yourself can reduce your risk of serious or life-threatening complications.  Follow instructions from your health care providers about managing your diabetes. Your diabetes may be managed by a team of health care providers who can teach you how to care for yourself and can answer questions that you have.  Your health care provider will tell you how often you need medical visits depending on your diabetes management plan. Keep all follow-up visits as directed. This is important so possible problems can be identified early and complications can be avoided or treated. This information is not intended to replace advice given to you by your health care provider. Make sure you discuss any questions you have with your health care provider. Document Released: 05/15/2011 Document Revised: 05/26/2016 Document Reviewed: 05/26/2016 Elsevier Interactive Patient Education  2018 [employer].  Screening for Type 2 Diabetes A screening test for type 2 diabetes (type 2 diabetes mellitus) is a blood test to measure your blood sugar (glucose) level. This test is done to check for early signs of diabetes, before you develop symptoms. Type 2 diabetes is a long-term (chronic) disease that occurs when the pancreas does not make enough of a hormone called insulin. This results in high blood glucose levels, which can cause many  complications. You may be screened for type 2 diabetes as part of your regular health care, especially if you have a high risk for diabetes. Screening can help identify type 2 diabetes at its early stage (prediabetes). Identifying and treating prediabetes may delay or prevent development of type 2 diabetes. What are the risk factors for type 2 diabetes? The following factors may make you more likely to develop type 2 diabetes:  Having a parent or sibling (first-degree relative) who has diabetes.  Being overweight or obese.  Being of American-Indian, [place] Islander, Hispanic, Latino, Asian, or African-American descent.  Not getting enough exercise.  Being older than 45.  Having a history of diabetes during pregnancy (gestational diabetes).  Having low levels of good cholesterol (HDL-C) or high levels of blood fats (triglycerides).  Having high blood glucose in a previous blood test.  Having high blood pressure.  Having certain diseases or conditions, including: ? Acanthosis nigricans. This is a condition that causes dark skin on the neck, armpits, and groin. ? Polycystic ovary syndrome (PCOS). ? Heart disease.  Having delivered a baby who weighed more than 9 lb (4.1 kg).  Who should be screened for type 2 diabetes? Adults  Adults age 44 and older. These adults should be screened at least once every three years.  Adults who are younger than 45, overweight, and have at least one other risk factor. These adults should be screened at least once every three years.  Adults who have normal blood glucose levels and two or more risk factors. These adults may be screened once every year (annually).  Women who have had gestational diabetes in the past. These women should be screened at least once every three years.  Pregnant women who have risk factors. These women should be screened at their first prenatal visit.  Pregnant women with no risk factors. These women should be screened  between weeks 24 and 28 of pregnancy. Children and adolescents  Children and adolescents should be screened for type 2 diabetes if they are overweight and have 2 of the following risk factors: ? A family history of type 2 diabetes. ? Being a member of a high risk  race or ethnic group. ? Signs of insulin resistance or conditions associated with insulin resistance. ? A mother who had gestational diabetes while pregnant with him or her.  Screening should be done at least once every three years, starting at age 81. Your health care provider or your child's health care provider may recommend having a screening more or less often. What happens during screening? During screening, your health care provider may ask questions about:  Your health and your risk factors, including your activity level and any medical conditions that you have.  The health of your first-degree relatives.  Past pregnancies, if this applies.  Your health care provider will also do a physical exam, including a blood pressure measurement and blood tests. There are four blood tests that can be used to screen for type 2 diabetes. You may have one or more of the following:  A fasting plasma glucose test (FBG). You will not be allowed to eat for at least eight hours before a blood sample is taken.  A random blood glucose test. This test checks your blood glucose at any time of the day regardless of when you ate.  An oral glucose tolerance test (OGTT). This test measures your blood glucose at two times: ? After you have not eaten (have fasted) overnight. ? Two hours after you drink a glucose-containing beverage. A diagnosis can be made if the level is greater than 200 mg/dL.  An A1c test. This test provides information about blood glucose control over the previous three months.  What do the results mean? Your test results are a measurement of how much glucose is in your blood. Normal blood glucose levels mean that you do not  have diabetes or prediabetes. High blood glucose levels may mean that you have prediabetes or diabetes. Depending on the results, other tests may be needed to confirm the diagnosis. This information is not intended to replace advice given to you by your health care provider. Make sure you discuss any questions you have with your health care provider. Document Released: 06/23/2009 Document Revised: 02/02/2016 Document Reviewed: 06/24/2015 Elsevier Interactive Patient Education  2017 [employer].  Tips for Eating Away From Home If You Have Diabetes Controlling your level of blood glucose, also known as blood sugar, can be challenging. It can be even more difficult when you do not prepare your own meals. The following tips can help you manage your diabetes when you eat away from home. Planning ahead Plan ahead if you know you will be eating away from home:  Ask your health care provider how to time meals and medicine if you are taking insulin.  Make a list of restaurants near you that offer healthy choices. If they have a carry-out menu, take it home and plan what you will order ahead of time.  Look up the restaurant you want to eat at online. Many chain and fast-food restaurants list nutritional information online. Use this information to choose the healthiest options and to calculate how many carbohydrates will be in your meal.  Use a carbohydrate-counting book or mobile app to look up the carbohydrate content and serving size of the foods you want to eat.  Become familiar with serving sizes and learn to recognize how many servings are in a portion. This will allow you to estimate how many carbohydrates you can eat.  Free foods A "free food" is any food or drink that has less than 5 g of carbohydrates per serving. Free foods include:  Many  vegetables.  Hard boiled eggs.  Nuts or seeds.  Olives.  Cheeses.  Meats.  These types of foods make good appetizer choices and are often  available at salad bars. Lemon juice, vinegar, or a low-calorie salad dressing of fewer than 20 calories per serving can be used as a "free" salad dressing. Choices to reduce carbohydrates  Substitute nonfat sweetened yogurt with a sugar-free yogurt. Yogurt made from soy milk may also be used, but you will still want a sugar-free or plain option to choose a lower carbohydrate amount.  Ask your server to take away the bread basket or chips from your table.  Order fresh fruit. A salad bar often offers fresh fruit choices. Avoid canned fruit because it is usually packed in sugar or syrup.  Order a salad, and eat it without dressing. Or, create a "free" salad dressing.  Ask for substitutions. For example, instead of [place] fries, request an order of a vegetable such as salad, green beans, or broccoli. Other tips  If you take insulin, take the insulin once your food arrives to your table. This will ensure your insulin and food are timed correctly.  Ask your server about the portion size before your order, and ask for a take-out box if the portion has more servings than you should have. When your food comes, leave the amount you should have on the plate, and put the rest in the take-out box.  Consider splitting an entree with someone and ordering a side salad. This information is not intended to replace advice given to you by your health care provider. Make sure you discuss any questions you have with your health care provider. Document Released: 08/27/2005 Document Revised: 02/02/2016 Document Reviewed: 11/24/2013 Elsevier Interactive Patient Education  [employer].

## 2017-10-24 LAB — BASIC METABOLIC PANEL
BUN / CREAT RATIO: 8 — AB (ref 9–20)
BUN: 7 mg/dL (ref 6–24)
CO2: 23 mmol/L (ref 20–29)
CREATININE: 0.91 mg/dL (ref 0.76–1.27)
Calcium: 9.8 mg/dL (ref 8.7–10.2)
Chloride: 100 mmol/L (ref 96–106)
GFR calc Af Amer: 112 mL/min/{1.73_m2} (ref >59)
GFR calc non Af Amer: 97 mL/min/{1.73_m2} (ref >59)
GLUCOSE: 202 mg/dL — AB (ref 65–99)
POTASSIUM: 4.4 mmol/L (ref 3.5–5.2)
SODIUM: 139 mmol/L (ref 134–144)

## 2017-10-24 LAB — CBC
Hematocrit: 41.2 % (ref 37.5–51.0)
Hemoglobin: 14.2 g/dL (ref 13.0–17.7)
MCH: 29.6 pg (ref 26.6–33.0)
MCHC: 34.5 g/dL (ref 31.5–35.7)
MCV: 86 fL (ref 79–97)
PLATELETS: 279 10*3/uL (ref 150–379)
RBC: 4.8 x10E6/uL (ref 4.14–5.80)
RDW: 13.5 % (ref 12.3–15.4)
WBC: 6.8 10*3/uL (ref 3.4–10.8)

## 2017-10-24 LAB — LIPID PANEL
CHOLESTEROL TOTAL: 197 mg/dL (ref 100–199)
Chol/HDL Ratio: 4.7 ratio (ref 0.0–5.0)
HDL: 42 mg/dL (ref >39)
LDL Calculated: 129 mg/dL — ABNORMAL HIGH (ref 0–99)
Triglycerides: 131 mg/dL (ref 0–149)
VLDL CHOLESTEROL CAL: 26 mg/dL (ref 5–40)

## 2017-10-24 LAB — MICROALBUMIN / CREATININE URINE RATIO
CREATININE, UR: 138.8 mg/dL
MICROALBUM., U, RANDOM: 122.4 ug/mL
Microalb/Creat Ratio: 88.2 mg/g creat — ABNORMAL HIGH (ref 0.0–30.0)

## 2017-10-24 LAB — TSH: TSH: 2.5 u[IU]/mL (ref 0.450–4.500)

## 2017-10-27 ENCOUNTER — Encounter: Payer: Self-pay | Admitting: Nurse Practitioner

## 2017-10-27 MED ORDER — ENALAPRIL MALEATE 10 MG PO TABS
10.0000 mg | ORAL_TABLET | Freq: Every day | ORAL | 2 refills | Status: DC
Start: 2017-10-27 — End: 2017-11-11

## 2017-10-27 MED ORDER — ATORVASTATIN CALCIUM 20 MG PO TABS: 20.0000 mg | ORAL_TABLET | Freq: Every day | ORAL | 3 refills | Status: DC

## 2017-10-28 ENCOUNTER — Telehealth: Payer: Self-pay

## 2017-10-28 MED FILL — ?ENALAPRIL MALEATE 10 MG TA: 10 MG | 30 days supply | Qty: 30 | Fill #0

## 2017-10-28 MED FILL — ATORVASTATIN 20 MG TABLET: 20 | 30 days supply | Qty: 30 | Fill #0

## 2017-10-28 NOTE — Telephone Encounter (Signed)
-----   Message from Claiborne RiggZelda W Fleming, NP sent at 10/26/2017  2:36 PM EST ----- Micro albumin is elevated which means your kidneys are being affected by your diabetes. I would like for you to take 10mg  or a whole tablet of enalapril. Unfortunately your a1c was not added to the labwork. Will obtain at your next office visit. Continue metformin as prescribed. Make sure you are drinking plenty of water; at least 48-64oz per day.  No fried foods. No junk foods, sodas, sugary drinks, unhealthy snacking, or smoking.

## 2017-10-28 NOTE — Telephone Encounter (Signed)
CMA spoke to patient to inform on lab results and PCP advising.  Patient understood and aware Rx been sent.

## 2017-11-04 ENCOUNTER — Ambulatory Visit: Payer: Self-pay

## 2017-11-11 ENCOUNTER — Ambulatory Visit: Payer: Self-pay | Attending: Nurse Practitioner | Admitting: Nurse Practitioner

## 2017-11-11 ENCOUNTER — Encounter: Payer: Self-pay | Admitting: Nurse Practitioner

## 2017-11-11 VITALS — BP 151/99 | HR 89 | Temp 98.6°F | Ht 67.0 in | Wt 184.6 lb

## 2017-11-11 DIAGNOSIS — Z79899 Other long term (current) drug therapy: Secondary | ICD-10-CM | POA: Insufficient documentation

## 2017-11-11 DIAGNOSIS — Z23 Encounter for immunization: Secondary | ICD-10-CM | POA: Insufficient documentation

## 2017-11-11 DIAGNOSIS — I10 Essential (primary) hypertension: Secondary | ICD-10-CM | POA: Insufficient documentation

## 2017-11-11 DIAGNOSIS — Z9884 Bariatric surgery status: Secondary | ICD-10-CM | POA: Insufficient documentation

## 2017-11-11 DIAGNOSIS — Z7984 Long term (current) use of oral hypoglycemic drugs: Secondary | ICD-10-CM | POA: Insufficient documentation

## 2017-11-11 DIAGNOSIS — E1165 Type 2 diabetes mellitus with hyperglycemia: Secondary | ICD-10-CM | POA: Insufficient documentation

## 2017-11-11 DIAGNOSIS — Z1211 Encounter for screening for malignant neoplasm of colon: Secondary | ICD-10-CM

## 2017-11-11 DIAGNOSIS — E119 Type 2 diabetes mellitus without complications: Secondary | ICD-10-CM

## 2017-11-11 DIAGNOSIS — Z833 Family history of diabetes mellitus: Secondary | ICD-10-CM | POA: Insufficient documentation

## 2017-11-11 DIAGNOSIS — Z791 Long term (current) use of non-steroidal anti-inflammatories (NSAID): Secondary | ICD-10-CM | POA: Insufficient documentation

## 2017-11-11 DIAGNOSIS — Z823 Family history of stroke: Secondary | ICD-10-CM | POA: Insufficient documentation

## 2017-11-11 DIAGNOSIS — Z9889 Other specified postprocedural states: Secondary | ICD-10-CM | POA: Insufficient documentation

## 2017-11-11 DIAGNOSIS — Z09 Encounter for follow-up examination after completed treatment for conditions other than malignant neoplasm: Secondary | ICD-10-CM | POA: Insufficient documentation

## 2017-11-11 LAB — POCT GLYCOSYLATED HEMOGLOBIN (HGB A1C): HEMOGLOBIN A1C: 10.9

## 2017-11-11 LAB — GLUCOSE, POCT (MANUAL RESULT ENTRY): POC GLUCOSE: 212 mg/dL — AB (ref 70–99)

## 2017-11-11 MED ORDER — SITAGLIPTIN PHOS-METFORMIN HCL 50-1000 MG PO TABS: 1.0000 | ORAL_TABLET | Freq: Two times a day (BID) | ORAL | 3 refills | Status: DC

## 2017-11-11 MED ORDER — QUINAPRIL HCL 20 MG PO TABS: 20.0000 mg | ORAL_TABLET | Freq: Every day | ORAL | 1 refills | Status: DC

## 2017-11-11 MED ORDER — GLIMEPIRIDE 2 MG PO TABS: 2.0000 mg | ORAL_TABLET | Freq: Every day | ORAL | 3 refills | Status: DC

## 2017-11-11 MED ORDER — ENALAPRIL MALEATE 20 MG PO TABS: 20.0000 mg | ORAL_TABLET | Freq: Every day | ORAL | 0 refills | Status: DC

## 2017-11-11 MED FILL — JANUMET 50-1,000 MG TABLET: 50-1000 | 30 days supply | Qty: 60 | Fill #0

## 2017-11-11 MED FILL — ENALAPRIL MALEATE 20 MG TAB: 20 | 30 days supply | Qty: 30 | Fill #0

## 2017-11-11 NOTE — Progress Notes (Signed)
Assessment & Plan:  Kevin [name] was seen today for follow-up.  Diagnoses and all orders for this visit:  Essential hypertension -     enalapril (VASOTEC) 20 MG tablet; Take 1 tablet (20 mg total) by mouth daily. Continue all antihypertensives as prescribed.  Remember to bring in your blood pressure log with you for your follow up appointment.  DASH/Mediterranean Diets are healthier choices for HTN.    Diabetes mellitus without complication (HCC) -     Glucose (CBG) -     HgB A1c -     sitaGLIPtin-metformin (JANUMET) 50-1000 MG tablet; Take 1 tablet by mouth 2 (two) times daily with a meal. -     glimepiride (AMARYL) 2 MG tablet; Take 1 tablet (2 mg total) by mouth daily before breakfast.  Diabetes is poorly controlled. Advised patient to keep a fasting blood sugar log fast, 2 hours post lunch and bedtime which will be reviewed at the next office visit.  Colon cancer screening -     Ambulatory referral to Gastroenterology   Immunization due -     Flu Vaccine QUAD 6+ mos PF IM (Fluarix Quad PF)     Patient has been counseled on age-appropriate routine health concerns for screening and prevention. These are reviewed and up-to-date. Referrals have been placed accordingly. Immunizations are up-to-date or declined.    Subjective:   Chief Complaint  Patient presents with  . Follow-up    Patient is here for a blood pressure recheck and diabetes follow-up.    HPI Kevin [name] 52 y.o. male presents to office today for follow up of hypertension.  Essential Hypertension Chronic.  Poorly controlled.  He has not been monitoring his blood pressure at home.  He has not been medication, diet or exercise compliant.  Denies chest pain, shortness of breath, palpitations, lightheadedness, dizziness, headaches or BLE edema.  Will likely need amlodipine 10 mg at his next blood pressure recheck appointment in 2 weeks.  Enalapril was increased to 20 mg daily today. BP Readings from Last 3 Encounters:   11/11/17 (!) 151/99  10/23/17 (!) 157/103  09/26/17 (!) 133/97   Diabetes Mellitus Type 2 Poorly controlled. Still not diet or exercise or medication compliant.  He has not checking his blood sugar still even after being ordered a glucometer.  We discussed the likelihood of serious diabetic complications with the increase of his A1c.  He assures me he well start taking his medications as prescribed.  Denies any hypo-or hyperglycemic symptoms. Lab Results  Component Value Date   HGBA1C 10.9 11/11/2017   Review of Systems  Constitutional: Negative for fever, malaise/fatigue and weight loss.  HENT: Negative.  Negative for nosebleeds.   Eyes: Negative.  Negative for blurred vision, double vision and photophobia.  Respiratory: Negative.  Negative for cough and shortness of breath.   Cardiovascular: Negative.  Negative for chest pain, palpitations and leg swelling.  Gastrointestinal: Negative.  Negative for heartburn, nausea and vomiting.  Musculoskeletal: Positive for back pain. Negative for myalgias.  Neurological: Negative.  Negative for dizziness, focal weakness, seizures and headaches.  Psychiatric/Behavioral: Negative.  Negative for suicidal ideas.    Past Medical History:  Diagnosis Date  . Diabetes mellitus without complication ([place])   . Hypertension   . MRSA (methicillin resistant Staphylococcus aureus)     Past Surgical History:  Procedure Laterality Date  . BACK SURGERY    . LAPAROSCOPIC GASTRIC BANDING      Family History  Problem Relation Age of Onset  .  Diabetes Sister   . Stroke Brother   . Diabetes Brother     Social History Reviewed with no changes to be made today.   Outpatient Medications Prior to Visit  Medication Sig Dispense Refill  . acetaminophen (TYLENOL) 500 MG tablet Take 4,000 mg by mouth. EVERY 3 TO 4 HOURS AS NEEDED FOR TOOTHACHE PAIN    . atorvastatin (LIPITOR) 20 MG tablet Take 1 tablet (20 mg total) by mouth daily. 90 tablet 3  . Blood  Glucose Monitoring Suppl (TRUE METRIX METER) w/Device KIT Use as instructed 1 kit 0  . glucose blood (TRUE METRIX BLOOD GLUCOSE TEST) test strip Use as instructed 100 each 12  . ibuprofen (ADVIL,MOTRIN) 200 MG tablet Take 200 mg by mouth daily as needed (TOOTHACHE PAIN).    Marland Kitchen omeprazole (PRILOSEC) 40 MG capsule Take 40 mg by mouth 2 (two) times daily.     . TRUEPLUS LANCETS 28G MISC Use as instructed 100 each 3  . enalapril (VASOTEC) 10 MG tablet Take 1 tablet (10 mg total) by mouth daily. 30 tablet 2  . metFORMIN (GLUCOPHAGE) 1000 MG tablet Take 1 tablet (1,000 mg total) by mouth 2 (two) times daily. 60 tablet 2   No facility-administered medications prior to visit.     No Known Allergies     Objective:    BP (!) 151/99 (BP Location: Left Arm, Patient Position: Sitting, Cuff Size: Normal)   Pulse 89   Temp 98.6 F (37 C) (Oral)   Ht 5' 7"  (1.702 m)   Wt 184 lb 9.6 oz (83.7 kg)   SpO2 97%   BMI 28.91 kg/m  Wt Readings from Last 3 Encounters:  11/11/17 184 lb 9.6 oz (83.7 kg)  10/23/17 184 lb 12.8 oz (83.8 kg)  09/26/17 180 lb (81.6 kg)    Physical Exam  Constitutional: He is oriented to person, place, and time. He appears well-developed and well-nourished. He is cooperative.  HENT:  Head: Normocephalic and atraumatic.  Eyes: EOM are normal.  Neck: Normal range of motion.  Cardiovascular: Normal rate, regular rhythm and normal heart sounds. Exam reveals no gallop and no friction rub.  No murmur heard. Pulmonary/Chest: Effort normal and breath sounds normal. No tachypnea. No respiratory distress. He has no decreased breath sounds. He has no wheezes. He has no rhonchi. He has no rales. He exhibits no tenderness.  Abdominal: Soft. Bowel sounds are normal.  Musculoskeletal: Normal range of motion. He exhibits no edema.  Neurological: He is alert and oriented to person, place, and time. Coordination normal.  Skin: Skin is warm and dry.  Psychiatric: He has a normal mood and  affect. His behavior is normal. Judgment and thought content normal.  Nursing note and vitals reviewed.        Patient has been counseled extensively about nutrition and exercise as well as the importance of adherence with medications and regular follow-up. The patient was given clear instructions to go to ER or return to medical center if symptoms don't improve, worsen or new problems develop. The patient verbalized understanding.   Follow-up: Return in about 2 weeks (around 11/25/2017) for BP recheck.   [name], FNP-BC [facility], [place]   11/13/2017, 6:09 PM

## 2017-11-11 NOTE — Patient Instructions (Addendum)
Coping With Diabetes Diabetes (type 1 diabetes mellitus or type 2 diabetes mellitus) is a condition in which the body does not have enough of a hormone called insulin, or the body does not respond properly to insulin. Normally, insulin allows sugars (glucose) to enter cells in the body. The cells use glucose for energy. With diabetes, extra glucose builds up in the blood instead of going into cells, which results in high blood glucose (hyperglycemia). How to manage lifestyle changes Managing diabetes includes medical treatments as well as lifestyle changes. If diabetes is not managed well, serious physical and emotional complications can occur. Taking good care of yourself means that you are responsible for:  Monitoring glucose regularly.  Eating a healthy diet.  Exercising regularly.  Meeting with health care providers.  Taking medicines as directed.  Some people may feel a lot of stress about managing their diabetes. This is known as emotional distress, and it is very common. Living with diabetes can place you at risk for emotional distress, depression, or anxiety. These disorders can be confusing and can make diabetes management more difficult. How to recognize stress Emotional distress Symptoms of emotional distress include:  Anger about having a diagnosis of diabetes.  Fear or frustration about your diagnosis and the changes you need to make to manage the condition.  Being overly worried about the care that you need or the cost of the care you need.  Feeling like you caused your condition by doing something wrong.  Fear of unpredictable situations, like low or high blood glucose.  Feeling judged by your health care providers.  Feeling very alone with the disease.  Getting too tired or "burned out" with the demands of daily care.  Depression Having diabetes means that you are at a higher risk for depression. Having depression also means that you are at a higher risk for  diabetes. Your health care provider may test (screen) you for symptoms of depression. It is important to recognize depression symptoms and to start treatment for it soon after it is diagnosed. The following are some symptoms of depression:  Loss of interest in things that you used to enjoy.  Trouble sleeping, or often waking up early and not being able to get back to sleep.  A change in appetite.  Feeling tired most of the day.  Feeling nervous and anxious.  Feeling guilty and worrying that you are a burden to others.  Feeling depressed more often than you do not feel that way.  Thoughts of hurting yourself or feeling that you want to die.  If you have any of these symptoms for 2 weeks or longer, reach out to a health care provider. Where to find support  Ask your health care provider to recommend a therapist who understands both depression and diabetes.  Search for information and support from the American Diabetes Association: www.diabetes.org  Find a certified diabetes educator and make an appointment through [place] of Diabetes Educators: www.diabeteseducator.org Follow these instructions at home: Managing emotional distress The following are some ways to manage emotional distress:  Talk with your health care provider or certified diabetes educator. Consider working with a counselor or therapist.  Learn as much as you can about diabetes and its treatment. Meet with a certified diabetes educator or take a class to learn how to manage your condition.  Keep a journal of your thoughts and concerns.  Accept that some things are out of your control.  Talk with other people who have diabetes. It  can help to talk with others about the emotional distress that you feel.  Find ways to manage stress that work for you. These may include art or music therapy, exercise, meditation, and hobbies.  Seek support from spiritual leaders, family, and friends.  General  instructions  Follow your diabetes management plan.  Keep all follow-up visits as told by your health care provider. This is important. Get help right away if:  You have thoughts about hurting yourself or others. If you ever feel like you may hurt yourself or others, or have thoughts about taking your own life, get help right away. You can go to your nearest emergency department or call:  Your local emergency services (911 in the [place]  A suicide crisis helpline, such as the [place] at 253-734-7846. This is open 24 hours a day.  Summary  Diabetes (type 1 diabetes mellitus or type 2 diabetes mellitus) is a condition in which the body does not have enough of a hormone called insulin, or the body does not respond properly to insulin.  Living with diabetes puts you at risk for medical issues, and it also puts you at risk for emotional issues such as emotional distress, depression, and anxiety.  Recognizing the symptoms of emotional distress and depression may help you avoid problems with your diabetes control. It is important to start treatment for emotional distress and depression soon after they are diagnosed.  Having diabetes means that you are at a higher risk for depression. Ask your health care provider to recommend a therapist who understands both depression and diabetes.  If you experience symptoms of emotional distress or depression, it is important to discuss this with your health care provider, certified diabetes educator, or therapist. This information is not intended to replace advice given to you by your health care provider. Make sure you discuss any questions you have with your health care provider. Document Released: 01/10/2017 Document Revised: 01/10/2017 Document Reviewed: 01/10/2017 Elsevier Interactive Patient Education  2018 [place] Eating Plan DASH stands for "Dietary Approaches to Stop Hypertension." The DASH eating plan  is a healthy eating plan that has been shown to reduce high blood pressure (hypertension). It may also reduce your risk for type 2 diabetes, heart disease, and stroke. The DASH eating plan may also help with weight loss. What are tips for following this plan? General guidelines  Avoid eating more than 2,300 mg (milligrams) of salt (sodium) a day. If you have hypertension, you may need to reduce your sodium intake to 1,500 mg a day.  Limit alcohol intake to no more than 1 drink a day for nonpregnant women and 2 drinks a day for men. One drink equals 12 oz of beer, 5 oz of wine, or 1 oz of hard liquor.  Work with your health care provider to maintain a healthy body weight or to lose weight. Ask what an ideal weight is for you.  Get at least 30 minutes of exercise that causes your heart to beat faster (aerobic exercise) most days of the week. Activities may include walking, swimming, or biking.  Work with your health care provider or diet and nutrition specialist (dietitian) to adjust your eating plan to your individual calorie needs. Reading food labels  Check food labels for the amount of sodium per serving. Choose foods with less than 5 percent of the Daily Value of sodium. Generally, foods with less than 300 mg of sodium per serving fit into this eating plan.  To find whole grains, look for the word "whole" as the first word in the ingredient list. Shopping  Buy products labeled as "low-sodium" or "no salt added."  Buy fresh foods. Avoid canned foods and premade or frozen meals. Cooking  Avoid adding salt when cooking. Use salt-free seasonings or herbs instead of table salt or sea salt. Check with your health care provider or pharmacist before using salt substitutes.  Do not fry foods. Cook foods using healthy methods such as baking, boiling, grilling, and broiling instead.  Cook with heart-healthy oils, such as olive, canola, soybean, or sunflower oil. Meal planning   Eat a  balanced diet that includes: ? 5 or more servings of fruits and vegetables each day. At each meal, try to fill half of your plate with fruits and vegetables. ? Up to 6-8 servings of whole grains each day. ? Less than 6 oz of lean meat, poultry, or fish each day. A 3-oz serving of meat is about the same size as a deck of cards. One egg equals 1 oz. ? 2 servings of low-fat dairy each day. ? A serving of nuts, seeds, or beans 5 times each week. ? Heart-healthy fats. Healthy fats called Omega-3 fatty acids are found in foods such as flaxseeds and coldwater fish, like sardines, salmon, and mackerel.  Limit how much you eat of the following: ? Canned or prepackaged foods. ? Food that is high in trans fat, such as fried foods. ? Food that is high in saturated fat, such as fatty meat. ? Sweets, desserts, sugary drinks, and other foods with added sugar. ? Full-fat dairy products.  Do not salt foods before eating.  Try to eat at least 2 vegetarian meals each week.  Eat more home-cooked food and less restaurant, buffet, and fast food.  When eating at a restaurant, ask that your food be prepared with less salt or no salt, if possible. What foods are recommended? The items listed may not be a complete list. Talk with your dietitian about what dietary choices are best for you. Grains Whole-grain or whole-wheat bread. Whole-grain or whole-wheat pasta. Brown rice. [place]. Bulgur. Whole-grain and low-sodium cereals. Pita bread. Low-fat, low-sodium crackers. Whole-wheat flour tortillas. Vegetables Fresh or frozen vegetables (raw, steamed, roasted, or grilled). Low-sodium or reduced-sodium tomato and vegetable juice. Low-sodium or reduced-sodium tomato sauce and tomato paste. Low-sodium or reduced-sodium canned vegetables. Fruits All fresh, dried, or frozen fruit. Canned fruit in natural juice (without added sugar). Meat and other protein foods Skinless chicken or [place]. Ground chicken or  [place]. Pork with fat trimmed off. Fish and seafood. Egg whites. Dried beans, peas, or lentils. Unsalted nuts, nut butters, and seeds. Unsalted canned beans. Lean cuts of beef with fat trimmed off. Low-sodium, lean deli meat. Dairy Low-fat (1%) or fat-free (skim) milk. Fat-free, low-fat, or reduced-fat cheeses. Nonfat, low-sodium ricotta or cottage cheese. Low-fat or nonfat yogurt. Low-fat, low-sodium cheese. Fats and oils Soft margarine without trans fats. Vegetable oil. Low-fat, reduced-fat, or light mayonnaise and salad dressings (reduced-sodium). Canola, safflower, olive, soybean, and sunflower oils. Avocado. Seasoning and other foods Herbs. Spices. Seasoning mixes without salt. Unsalted popcorn and pretzels. Fat-free sweets. What foods are not recommended? The items listed may not be a complete list. Talk with your dietitian about what dietary choices are best for you. Grains Baked goods made with fat, such as croissants, muffins, or some breads. Dry pasta or rice meal packs. Vegetables Creamed or fried vegetables. Vegetables in a cheese sauce. Regular canned vegetables (  not low-sodium or reduced-sodium). Regular canned tomato sauce and paste (not low-sodium or reduced-sodium). Regular tomato and vegetable juice (not low-sodium or reduced-sodium). Rosita FirePickles. Olives. Fruits Canned fruit in a light or heavy syrup. Fried fruit. Fruit in cream or butter sauce. Meat and other protein foods Fatty cuts of meat. Ribs. Fried meat. Tomasa BlaseBacon. Sausage. Bologna and other processed lunch meats. Salami. Fatback. Hotdogs. Bratwurst. Salted nuts and seeds. Canned beans with added salt. Canned or smoked fish. Whole eggs or egg yolks. Chicken or [place] with skin. Dairy Whole or 2% milk, cream, and half-and-half. Whole or full-fat cream cheese. Whole-fat or sweetened yogurt. Full-fat cheese. Nondairy creamers. Whipped toppings. Processed cheese and cheese spreads. Fats and oils Butter. Stick margarine. Lard.  Shortening. Ghee. Bacon fat. Tropical oils, such as coconut, palm kernel, or palm oil. Seasoning and other foods Salted popcorn and pretzels. Onion salt, garlic salt, seasoned salt, table salt, and sea salt. Worcestershire sauce. Tartar sauce. Barbecue sauce. Teriyaki sauce. Soy sauce, including reduced-sodium. Steak sauce. Canned and packaged gravies. Fish sauce. Oyster sauce. Cocktail sauce. Horseradish that you find on the shelf. Ketchup. Mustard. Meat flavorings and tenderizers. Bouillon cubes. Hot sauce and Tabasco sauce. Premade or packaged marinades. Premade or packaged taco seasonings. Relishes. Regular salad dressings. Where to find more information:  National Heart, Lung, and Blood Institute: PopSteam.iswww.nhlbi.nih.gov  American Heart Association: www.heart.org Summary  The DASH eating plan is a healthy eating plan that has been shown to reduce high blood pressure (hypertension). It may also reduce your risk for type 2 diabetes, heart disease, and stroke.  With the DASH eating plan, you should limit salt (sodium) intake to 2,300 mg a day. If you have hypertension, you may need to reduce your sodium intake to 1,500 mg a day.  When on the DASH eating plan, aim to eat more fresh fruits and vegetables, whole grains, lean proteins, low-fat dairy, and heart-healthy fats.  Work with your health care provider or diet and nutrition specialist (dietitian) to adjust your eating plan to your individual calorie needs. This information is not intended to replace advice given to you by your health care provider. Make sure you discuss any questions you have with your health care provider. Document Released: 08/16/2011 Document Revised: 08/20/2016 Document Reviewed: 08/20/2016 Elsevier Interactive Patient Education  [employer]2018 Elsevier Inc.

## 2017-11-13 ENCOUNTER — Encounter: Payer: Self-pay | Admitting: Nurse Practitioner

## 2017-11-15 ENCOUNTER — Encounter: Payer: Self-pay | Admitting: Gastroenterology

## 2017-11-20 ENCOUNTER — Ambulatory Visit (HOSPITAL_COMMUNITY)
Admission: EM | Admit: 2017-11-20 | Discharge: 2017-11-20 | Disposition: A | Discharge status: Home / Self Care | Payer: Self-pay | Attending: Family Medicine | Admitting: Family Medicine

## 2017-11-20 ENCOUNTER — Encounter (HOSPITAL_COMMUNITY): Payer: Self-pay | Admitting: Emergency Medicine

## 2017-11-20 DIAGNOSIS — K0889 Other specified disorders of teeth and supporting structures: Secondary | ICD-10-CM

## 2017-11-20 MED ORDER — HYDROCODONE-ACETAMINOPHEN 5-325 MG PO TABS: 1.0000 | ORAL_TABLET | Freq: Four times a day (QID) | ORAL | 0 refills | Status: DC | PRN

## 2017-11-20 MED ORDER — AMOXICILLIN 875 MG PO TABS: 875.0000 mg | ORAL_TABLET | Freq: Two times a day (BID) | ORAL | 0 refills | Status: AC

## 2017-11-20 MED FILL — AMOXICILLIN 875 MG TABLET: 875 | 10 days supply | Qty: 20 | Fill #0

## 2017-11-20 NOTE — ED Triage Notes (Signed)
Pt c/o dental pain and "I think an abscess is starting to form". Since yesterday.

## 2017-11-20 NOTE — ED Provider Notes (Signed)
  [facility]   [id number] 11/20/17 Arrival Time: 1231  ASSESSMENT & PLAN:  1. Pain, dental     Meds ordered this encounter  Medications  . amoxicillin (AMOXIL) 875 MG tablet    Sig: Take 1 tablet (875 mg total) by mouth 2 (two) times daily for 10 days.    Dispense:  20 tablet    Refill:  0  . HYDROcodone-acetaminophen (NORCO/VICODIN) 5-325 MG tablet    Sig: Take 1 tablet by mouth every 6 (six) hours as needed for moderate pain or severe pain.    Dispense:  4 tablet    Refill:  0   [place] Controlled Substances Registry consulted for this patient. I feel the risk/benefit ratio today is favorable for proceeding with this prescription for a controlled substance. Medication sedation precautions given.  Dental resource written instructions given. He will schedule dental evaluation as soon as possible. May f/u here as needed. Reviewed expectations re: course of current medical issues. Questions answered. Outlined signs and symptoms indicating need for more acute intervention. Patient verbalized understanding. After Visit Summary given.   SUBJECTIVE:  Kevin [name] is a 52 y.o. male who reports abrupt onset of left upper frontal dental pain described as aching. Present for 1-2 days. Afebrile. Tolerating PO intake but reports pain with chewing. Normal swallowing. He does not see a dentist regularly. No neck swelling or pain. OTC analgesics without relief. No neck pain or swelling.  ROS: As per HPI.  OBJECTIVE:  Vitals:   11/20/17 1329  BP: (!) 163/106  Pulse: (!) 112  Resp: 18  Temp: 98.3 F (36.8 C)  SpO2: 100%    General appearance: alert; no distress HENT: normocephalic; atraumatic; dentition: fair; gingival hypertrophy over left upper frontal gums without areas of fluctuance but tender to touch Neck: supple without LAD Lungs: normal respirations Skin: warm and dry Psychological: alert and cooperative; normal mood and affect  No Known Allergies  Past Medical  History:  Diagnosis Date  . Diabetes mellitus without complication (HCC)   . Hypertension   . MRSA (methicillin resistant Staphylococcus aureus)    Social History   Socioeconomic History  . Marital status: Divorced    Spouse name: Not on file  . Number of children: Not on file  . Years of education: Not on file  . Highest education level: Not on file  Social Needs  . Financial resource strain: Not on file  . Food insecurity - worry: Not on file  . Food insecurity - inability: Not on file  . Transportation needs - medical: Not on file  . Transportation needs - non-medical: Not on file  Occupational History  . Not on file  Tobacco Use  . Smoking status: Former Smoker    Last attempt to quit: 2000    Years since quitting: 19.2  . Smokeless tobacco: Never Used  Substance and Sexual Activity  . Alcohol use: No  . Drug use: No  . Sexual activity: Yes  Other Topics Concern  . Not on file  Social History Narrative  . Not on file   Family History  Problem Relation Age of Onset  . Diabetes Sister   . Stroke Brother   . Diabetes Brother    Past Surgical History:  Procedure Laterality Date  . BACK SURGERY    . LAPAROSCOPIC GASTRIC Domingo PulseBANDING       Dierre Crevier, MD 11/27/17 [phone]

## 2017-11-20 NOTE — Discharge Instructions (Signed)
Be aware, pain medications may cause drowsiness. Please do not drive, operate heavy machinery or make important decisions while on this medication, it can cloud your judgement.  

## 2017-11-21 MED FILL — TRUE METRIX TEST STRIP: 30 days supply | Qty: 100 | Fill #1

## 2017-11-21 MED FILL — TRUEplus LANCETS 28G MISC: 30 days supply | Qty: 100 | Fill #1

## 2017-11-21 MED FILL — ATORVASTATIN 20 MG TABLET: 20 | 30 days supply | Qty: 30 | Fill #1

## 2017-11-22 ENCOUNTER — Other Ambulatory Visit: Payer: Self-pay

## 2017-11-22 ENCOUNTER — Other Ambulatory Visit: Payer: Self-pay | Admitting: Nurse Practitioner

## 2017-11-22 DIAGNOSIS — Z23 Encounter for immunization: Secondary | ICD-10-CM

## 2017-11-22 MED ORDER — TETANUS-DIPHTH-ACELL PERTUSSIS 5-2.5-18.5 LF-MCG/0.5 IM SUSP: 0.5000 mL | Freq: Once | INTRAMUSCULAR | Status: DC

## 2017-11-22 MED ORDER — TETANUS-DIPHTH-ACELL PERTUSSIS 5-2.5-18.5 LF-MCG/0.5 IM SUSP: 0.5000 mL | Freq: Once | INTRAMUSCULAR | 0 refills | Status: AC

## 2017-11-26 ENCOUNTER — Ambulatory Visit: Payer: Self-pay | Attending: Nurse Practitioner | Admitting: *Deleted

## 2017-11-26 ENCOUNTER — Other Ambulatory Visit: Payer: Self-pay | Admitting: Pharmacist

## 2017-11-26 ENCOUNTER — Other Ambulatory Visit: Payer: Self-pay | Admitting: Nurse Practitioner

## 2017-11-26 ENCOUNTER — Ambulatory Visit: Payer: Self-pay

## 2017-11-26 VITALS — BP 147/96 | HR 80

## 2017-11-26 DIAGNOSIS — I10 Essential (primary) hypertension: Secondary | ICD-10-CM | POA: Insufficient documentation

## 2017-11-26 DIAGNOSIS — K089 Disorder of teeth and supporting structures, unspecified: Secondary | ICD-10-CM

## 2017-11-26 DIAGNOSIS — Z7689 Persons encountering health services in other specified circumstances: Secondary | ICD-10-CM | POA: Insufficient documentation

## 2017-11-26 DIAGNOSIS — E119 Type 2 diabetes mellitus without complications: Secondary | ICD-10-CM | POA: Insufficient documentation

## 2017-11-26 MED ORDER — PNEUMOCOCCAL VAC POLYVALENT 25 MCG/0.5ML IJ INJ: INJECTION | INTRAMUSCULAR | 0 refills | Status: DC

## 2017-11-26 MED ORDER — GLIMEPIRIDE 2 MG PO TABS: 2.0000 mg | ORAL_TABLET | Freq: Every day | ORAL | 3 refills | Status: DC

## 2017-11-26 MED FILL — $PNEUMOVAX 23 VIAL: 25 | 1 days supply | Qty: 1 | Fill #0

## 2017-11-26 MED FILL — ?GLIMEPIRIDE 2 MG TABLET: 2 | 30 days supply | Qty: 30 | Fill #0

## 2017-11-26 NOTE — Progress Notes (Signed)
[facility]. Pt is alert and oriented.     Pt denies chest pain, SOB, HA, dizziness, or blurred vision.  Verified medication with patient. Pt states medication was taken this morning.  He reports he was not aware to take glimepiride 2 mg for DM. His blood sugars Have ranged between 140- 250.  Medication was re-sent to the pharmacy. Per pharmacist Glimepiride was not in their system. Pt is aware and encouraged to pick up medication from  Pharmacy and begin to take.   Blood pressure reading: 147/96 His reading at last OV was 151/99.

## 2017-11-26 NOTE — Progress Notes (Unsigned)
pneumo

## 2017-11-27 MED FILL — $BOOSTRIX VACCINE SYRINGE: 5-2.5-18.5 | 1 days supply | Qty: 1 | Fill #0

## 2017-12-02 ENCOUNTER — Ambulatory Visit: Payer: Self-pay

## 2017-12-09 MED FILL — JANUMET 50-1,000 MG TABLET: 50-1000 | 30 days supply | Qty: 60 | Fill #1

## 2017-12-09 MED FILL — ENALAPRIL MALEATE 20 MG TAB: 20 | 30 days supply | Qty: 30 | Fill #1

## 2017-12-23 MED FILL — ATORVASTATIN 20 MG TABLET: 20 | 30 days supply | Qty: 30 | Fill #2

## 2017-12-23 MED FILL — GLIMEPIRIDE 2 MG TABS: 2 | 30 days supply | Qty: 30 | Fill #1

## 2017-12-24 ENCOUNTER — Encounter (HOSPITAL_COMMUNITY): Payer: Self-pay | Admitting: Emergency Medicine

## 2017-12-24 ENCOUNTER — Ambulatory Visit (HOSPITAL_COMMUNITY)
Admission: EM | Admit: 2017-12-24 | Discharge: 2017-12-24 | Disposition: A | Discharge status: Home / Self Care | Payer: Self-pay | Attending: Family | Admitting: Family

## 2017-12-24 DIAGNOSIS — K029 Dental caries, unspecified: Secondary | ICD-10-CM

## 2017-12-24 DIAGNOSIS — I10 Essential (primary) hypertension: Secondary | ICD-10-CM

## 2017-12-24 DIAGNOSIS — K047 Periapical abscess without sinus: Secondary | ICD-10-CM

## 2017-12-24 MED ORDER — CLINDAMYCIN HCL 300 MG PO CAPS: 300.0000 mg | ORAL_CAPSULE | Freq: Three times a day (TID) | ORAL | 0 refills | Status: AC

## 2017-12-24 MED ORDER — HYDROCODONE-ACETAMINOPHEN 5-325 MG PO TABS: 1.0000 | ORAL_TABLET | Freq: Four times a day (QID) | ORAL | 0 refills | Status: DC | PRN

## 2017-12-24 MED FILL — CLINDAMYCIN HCL 300 MG CAP: 300 | 10 days supply | Qty: 30 | Fill #0

## 2017-12-24 NOTE — Discharge Instructions (Signed)
Recommend start Clindamycin 300mg  three times a day as directed. May take Vicodin 1-2 tablets every 6 hours as needed for pain. Apply ice to area to help with pain. Recommend continue to contact dentists for further evaluation [facility] 3 days if not improving.

## 2017-12-24 NOTE — ED Provider Notes (Signed)
[place]    CSN: [id number] Arrival date & time: 12/24/17  2633     History   Chief Complaint Chief Complaint  Patient presents with  . Dental Pain    HPI Kevin [name] is a 52 y.o. male.   52 year old male presents with continued dental issues. Has history of poor dentition and recurrent dental abscesses.  Unable to be seen by a dentist yet due to availability and financial issues. Now having pain and swelling of left upper gum near lateral incisor and canine that became much worse yesterday. This morning squeezed gum area and yellowish pus was released and feels slightly better. Pain level now 7/10. Denies any fever, sore throat, chest pain, nausea or vomiting. Has been taking Tylenol with minimal relief. Unable to take Ibuprofen and NSAIDs due to gastritis. Also has history of HTN in which he just started Enalapril and type 2 diabetes in which he takes Janumet and Glimepiride daily. Also on Lipitor for hyperlipidemia and Prilosec for GERD. Former smoker. Denies any alcohol or illicit drug use. Last seen here 11/20/17 for dental abscess and was placed on Amoxicillin and 4 tablets of Vicodin with some success.   The history is provided by the patient.    Past Medical History:  Diagnosis Date  . Diabetes mellitus without complication ([place])   . Hypertension   . MRSA (methicillin resistant Staphylococcus aureus)     Patient Active Problem List   Diagnosis Date Noted  . Diabetes mellitus without complication ([place]) 35/45/6256  . Essential hypertension 11/11/2017    Past Surgical History:  Procedure Laterality Date  . BACK SURGERY    . LAPAROSCOPIC GASTRIC BANDING         Home Medications    Prior to Admission medications   Medication Sig Start Date End Date Taking? Authorizing Provider  acetaminophen (TYLENOL) 500 MG tablet Take 4,000 mg by mouth. EVERY 3 TO 4 HOURS AS NEEDED FOR TOOTHACHE PAIN    [provider]  atorvastatin (LIPITOR) 20 MG tablet  Take 1 tablet (20 mg total) by mouth daily. 10/27/17   [name], NP  Blood Glucose Monitoring Suppl (TRUE METRIX METER) w/Device KIT Use as instructed 10/23/17   [name], NP  clindamycin (CLEOCIN) 300 MG capsule Take 1 capsule (300 mg total) by mouth 3 (three) times daily for 10 days. 12/24/17 01/03/18  Katy Apo, NP  enalapril (VASOTEC) 20 MG tablet Take 1 tablet (20 mg total) by mouth daily. 11/11/17 02/09/18  [name], NP  glimepiride (AMARYL) 2 MG tablet Take 1 tablet (2 mg total) by mouth daily before breakfast. 11/26/17   [name], NP  glucose blood (TRUE METRIX BLOOD GLUCOSE TEST) test strip Use as instructed 10/23/17   [name], NP  HYDROcodone-acetaminophen (NORCO/VICODIN) 5-325 MG tablet Take 1-2 tablets by mouth every 6 (six) hours as needed for severe pain. 12/24/17   Katy Apo, NP  omeprazole (PRILOSEC) 40 MG capsule Take 40 mg by mouth 2 (two) times daily.     [provider]  pneumococcal 23 valent vaccine (PNEUMOVAX 23) 25 MCG/0.5ML injection To be administered by the Pharmacist 11/26/17   Tresa Garter, MD  sitaGLIPtin-metformin (JANUMET) 50-1000 MG tablet Take 1 tablet by mouth 2 (two) times daily with a meal. 11/11/17   [name], NP  TRUEPLUS LANCETS 28G MISC Use as instructed 10/23/17   [name], NP    Family History Family History  Problem Relation Age of Onset  . Diabetes Sister   . Stroke Brother   . Diabetes Brother     Social History Social History   Tobacco Use  . Smoking status: Former Smoker    Last attempt to quit: 2000    Years since quitting: 19.3  . Smokeless tobacco: Never Used  Substance Use Topics  . Alcohol use: No  . Drug use: No     Allergies   Patient has no known allergies.   Review of Systems Review of Systems  Constitutional: Positive for appetite change and fatigue. Negative for activity change, chills and fever.  HENT: Positive for dental problem, facial  swelling and mouth sores. Negative for congestion, ear pain, nosebleeds, postnasal drip, rhinorrhea, sinus pressure, sinus pain, sore throat and trouble swallowing.   Eyes: Negative for photophobia, pain, discharge, redness, itching and visual disturbance.  Respiratory: Negative for cough, chest tightness, shortness of breath and wheezing.   Cardiovascular: Negative for chest pain and palpitations.  Gastrointestinal: Negative for abdominal pain, nausea and vomiting.  Musculoskeletal: Negative for arthralgias, myalgias, neck pain and neck stiffness.  Neurological: Positive for headaches. Negative for dizziness, tremors, seizures, syncope, weakness, light-headedness and numbness.  Hematological: Negative for adenopathy. Does not bruise/bleed easily.     Physical Exam Triage Vital Signs ED Triage Vitals [12/24/17 1007]  Enc Vitals Group     BP (!) 206/119     Pulse Rate (!) 115     Resp 16     Temp 98.4 F (36.9 C)     Temp src      SpO2 100 %     Weight      Height      Head Circumference      Peak Flow      Pain Score      Pain Loc      Pain Edu?      Excl. in [place]?    No data found.  Updated Vital Signs BP (!) 206/119 Comment: pt states he just started working on his BP with his PCP  Pulse (!) 115   Temp 98.4 F (36.9 C)   Resp 16   SpO2 100%   Visual Acuity Right Eye Distance:   Left Eye Distance:   Bilateral Distance:    Right Eye Near:   Left Eye Near:    Bilateral Near:     Physical Exam  Constitutional: He is oriented to person, place, and time. He appears well-developed and well-nourished. He appears ill. No distress.  Patient sitting in exam chair holding the left side of his face and appears in pain.   HENT:  Head: Normocephalic and atraumatic.  Right Ear: Hearing, tympanic membrane, external ear and ear canal normal.  Left Ear: Hearing, tympanic membrane, external ear and ear canal normal.  Nose: Nose normal. Right sinus exhibits no maxillary sinus  tenderness and no frontal sinus tenderness. Left sinus exhibits no maxillary sinus tenderness and no frontal sinus tenderness.  Mouth/Throat: Oropharynx is clear and moist and mucous membranes are normal. No oral lesions. Abnormal dentition. Dental abscesses and dental caries present. No uvula swelling. No tonsillar exudate.    Multiple teeth missing or in poor repair. Most of left lateral incisor and canine missing but remainder of tooth is black with red swollen gum and slight yellowish discharge from upper gum. Very tender. Outer left upper lip very tender and slightly swollen. No redness.   Eyes: Conjunctivae and EOM are normal.  Neck: Normal  range of motion. Neck supple.  Cardiovascular: Regular rhythm and normal heart sounds. Tachycardia present.  Pulmonary/Chest: Effort normal and breath sounds normal. No respiratory distress. He has no decreased breath sounds.  Musculoskeletal: Normal range of motion.  Lymphadenopathy:    He has no cervical adenopathy.  Neurological: He is alert and oriented to person, place, and time.  Skin: Skin is warm and dry.  Psychiatric: His speech is normal and behavior is normal. Judgment and thought content normal. His mood appears anxious. Cognition and memory are normal.  Patient anxious due to pain but appreciative that we are available to help him today  Vitals reviewed.    UC Treatments / Results  Labs (all labs ordered are listed, but only abnormal results are displayed) Labs Reviewed - No data to display  EKG None Radiology No results found.  Procedures Procedures (including critical care time)  Medications Ordered in UC Medications - No data to display   Initial Impression / Assessment and Plan / UC Course  I have reviewed the triage vital signs and the nursing notes.  Pertinent labs & imaging results that were available during my care of the patient were reviewed by me and considered in my medical decision making (see chart for  details).    Discussed that he does appear to have another dental abscess and multiple dental caries. Since just on Amoxicillin 1 month ago, will trial Clindamycin 323m 3 times a day for 10 days. May continue Tylenol 10054mevery 8 hours for pain or may take Vicodin 1-2 tablets every 6 hours for pain #8 with no refill- do not take with Tylenol. Continue to monitor BP- probably elevated some today due to pain but continue to see PCP for management. Encouraged to continue to seek other dentists (currently on waiting list for dental clinic)- [facility] Follow-up with a dentist as planned or see his PCP if current pain and symptoms do not improve within 3 days.   Final Clinical Impressions(s) / UC Diagnoses   Final diagnoses:  Dental abscess  Pain due to dental caries  Elevated blood pressure reading with diagnosis of hypertension    ED Discharge Orders        Ordered    clindamycin (CLEOCIN) 300 MG capsule  3 times daily     12/24/17 1033    HYDROcodone-acetaminophen (NORCO/VICODIN) 5-325 MG tablet  Every 6 hours PRN     12/24/17 1035       Controlled Substance Prescriptions [place] Controlled Substance Registry consulted? Yes, I have consulted the  Controlled Substances Registry for this patient, and feel the risk/benefit ratio today is favorable for proceeding with this prescription for a controlled substance. Last active Rx was on 11/20/17 for [facility]in [facility]   AmKaty ApoNP 12/25/17 [phone]

## 2017-12-24 NOTE — ED Triage Notes (Signed)
Pt c/o abscess and dental pain x1 month

## 2017-12-30 MED FILL — TRUEplus LANCETS 28G MISC: 30 days supply | Qty: 100 | Fill #2

## 2017-12-30 MED FILL — TRUE METRIX TEST STRIP: 30 days supply | Qty: 100 | Fill #2

## 2018-01-02 MED FILL — ENALAPRIL MALEATE 20 MG TAB: 20 | 30 days supply | Qty: 30 | Fill #2

## 2018-01-03 MED FILL — JANUMET 50-1,000 MG TABLET: 50-1000 | 30 days supply | Qty: 60 | Fill #2

## 2018-01-20 MED FILL — GLIMEPIRIDE 2 MG TABS: 2 | 30 days supply | Qty: 30 | Fill #2

## 2018-01-20 MED FILL — ?ATORVASTATIN 20 MG TABLET: 20 | 30 days supply | Qty: 30 | Fill #3

## 2018-01-23 ENCOUNTER — Ambulatory Visit (INDEPENDENT_AMBULATORY_CARE_PROVIDER_SITE_OTHER): Payer: Self-pay | Admitting: Nurse Practitioner

## 2018-01-23 ENCOUNTER — Other Ambulatory Visit: Payer: Self-pay

## 2018-01-23 ENCOUNTER — Encounter (INDEPENDENT_AMBULATORY_CARE_PROVIDER_SITE_OTHER): Payer: Self-pay | Admitting: Nurse Practitioner

## 2018-01-23 VITALS — BP 136/88 | HR 85 | Temp 98.4°F | Ht 67.0 in | Wt 187.2 lb

## 2018-01-23 DIAGNOSIS — R0789 Other chest pain: Secondary | ICD-10-CM

## 2018-01-23 DIAGNOSIS — I10 Essential (primary) hypertension: Secondary | ICD-10-CM

## 2018-01-23 NOTE — Patient Instructions (Signed)
Diabetes blood sugar goals  Fasting in AM before breakfast which means at least 8 hrs of no eating or drinking) except water or unsweetened coffee or tea): 90-110 2 hrs after meals: < 160,   Hypoglycemia or low blood sugar: < 70 (You should not have hypoglycemia.)  Aim for 30 minutes of exercise most days. Rethink what you drink. Water is great! Aim for 2-3 Carb Choices per meal (30-45 grams) +/- 1 either way  Aim for 0-15 Carbs per snack if hungry  Include protein in moderation with your meals and snacks  Consider reading food labels for Total Carbohydrate and Fat Grams of foods  Consider checking BG at alternate times per day  Continue taking medication as directed Be mindful about how much sugar you are adding to beverages and other foods. Fruit Punch - find one with no sugar  Measure and decrease portions of carbohydrate foods  Make your plate and don't go back for seconds 

## 2018-01-23 NOTE — Progress Notes (Signed)
 Assessment & Plan:  Kevin [name] was seen today for chest pain.  Diagnoses and all orders for this visit:  Other chest pain Resolved EKG next office visit   Essential Hypertension Continue all antihypertensives as prescribed.  Remember to bring in your blood pressure log with you for your follow up appointment.  DASH/Mediterranean Diets are healthier choices for HTN.     Patient has been counseled on age-appropriate routine health concerns for screening and prevention. These are reviewed and up-to-date. Referrals have been placed accordingly. Immunizations are up-to-date or declined.    Subjective:   Chief Complaint  Patient presents with  . Chest Pain   HPI Kevin [name] 52 y.o. male presents to office today with complaints of intermittent chest pain. He is currently chest pain free.  He has a significant family history of cardiac disease.     Chest Pain Patient complains of chest pain. Onset was 1 month ago, with resolved course since that time. The patient describes the pain as intermittent, precordial and pressure like in nature, radiates to the right arm. Patient rates pain as a 6/10 in intensity.  Associated symptoms are dyspnea and exertional chest pressure/discomfort. Aggravating factors are exercise.  Alleviating factors are: rest. Patient's cardiac risk factors are diabetes mellitus, dyslipidemia, family history of premature cardiovascular disease, hypertension and male gender.  Patient's risk factors for DVT/PE: none. Previous cardiac testing: none.  I have recommended a cardiology consult however he declines the request today. States "I don't really think I need it". He has an upcoming appointment for his diabetes next month. He would like to see if the chest pain re occurs during that time and then he will decide if he would like a cardiology consult.   Essential Hypertension Chronic. Stable and well controlled. Endorses medication compliance. Denies chest pain, shortness of  breath, palpitations, lightheadedness, dizziness, headaches or BLE edema.  BP Readings from Last 3 Encounters:  01/23/18 136/88  12/24/17 (!) 206/119  11/26/17 (!) 147/96   Review of Systems  Constitutional: Negative for fever, malaise/fatigue and weight loss.  HENT: Negative.  Negative for nosebleeds.   Eyes: Negative.  Negative for blurred vision, double vision and photophobia.  Respiratory: Positive for shortness of breath. Negative for cough.   Cardiovascular: Positive for chest pain. Negative for palpitations and leg swelling.  Gastrointestinal: Negative.  Negative for heartburn, nausea and vomiting.  Musculoskeletal: Negative.  Negative for myalgias.  Neurological: Negative.  Negative for dizziness, focal weakness, seizures and headaches.  Psychiatric/Behavioral: Negative.  Negative for suicidal ideas.    Past Medical History:  Diagnosis Date  . Diabetes mellitus without complication ([facility])   . History of prediabetes   . Hypertension   . MRSA (methicillin resistant Staphylococcus aureus)     Past Surgical History:  Procedure Laterality Date  . BACK SURGERY    . LAPAROSCOPIC GASTRIC BANDING      Family History  Problem Relation Age of Onset  . Heart disease Mother   . Hypertension Mother   . Diabetes Sister   . Hypertension Sister   . Stroke Brother   . Diabetes Brother   . Heart disease Brother   . Heart attack Brother   . Hypertension Brother   . Hypertension Father     Social History Reviewed with no changes to be made today.   Outpatient Medications Prior to Visit  Medication Sig Dispense Refill  . acetaminophen (TYLENOL) 500 MG tablet Take 4,000 mg by mouth. EVERY 3 TO 4 HOURS AS NEEDED   FOR TOOTHACHE PAIN    . atorvastatin (LIPITOR) 20 MG tablet Take 1 tablet (20 mg total) by mouth daily. 90 tablet 3  . Blood Glucose Monitoring Suppl (TRUE METRIX METER) w/Device KIT Use as instructed 1 kit 0  . enalapril (VASOTEC) 20 MG tablet Take 1 tablet (20 mg total)  by mouth daily. 90 tablet 0  . glimepiride (AMARYL) 2 MG tablet Take 1 tablet (2 mg total) by mouth daily before breakfast. 90 tablet 3  . glucose blood (TRUE METRIX BLOOD GLUCOSE TEST) test strip Use as instructed 100 each 12  . omeprazole (PRILOSEC) 40 MG capsule Take 40 mg by mouth 2 (two) times daily.     . sitaGLIPtin-metformin (JANUMET) 50-1000 MG tablet Take 1 tablet by mouth 2 (two) times daily with a meal. 60 tablet 3  . TRUEPLUS LANCETS 28G MISC Use as instructed 100 each 3  . HYDROcodone-acetaminophen (NORCO/VICODIN) 5-325 MG tablet Take 1-2 tablets by mouth every 6 (six) hours as needed for severe pain. (Patient not taking: Reported on 01/23/2018) 8 tablet 0  . pneumococcal 23 valent vaccine (PNEUMOVAX 23) 25 MCG/0.5ML injection To be administered by the Pharmacist 0.5 mL 0   No facility-administered medications prior to visit.     No Known Allergies     Objective:    BP 136/88 (BP Location: Left Arm, Patient Position: Sitting, Cuff Size: Normal)   Pulse 85   Temp 98.4 F (36.9 C) (Oral)   Ht 5' 7" (1.702 m)   Wt 187 lb 3.2 oz (84.9 kg)   SpO2 96%   BMI 29.32 kg/m  Wt Readings from Last 3 Encounters:  01/23/18 187 lb 3.2 oz (84.9 kg)  11/11/17 184 lb 9.6 oz (83.7 kg)  10/23/17 184 lb 12.8 oz (83.8 kg)    Physical Exam  Constitutional: He is oriented to person, place, and time. He appears well-developed and well-nourished. He is cooperative.  HENT:  Head: Normocephalic and atraumatic.  Eyes: EOM are normal.  Neck: Normal range of motion.  Cardiovascular: Normal rate, regular rhythm and normal heart sounds. Exam reveals no friction rub.  No murmur heard. Pulmonary/Chest: Effort normal and breath sounds normal. No tachypnea. No respiratory distress. He has no decreased breath sounds. He has no wheezes. He has no rhonchi. He has no rales. He exhibits no tenderness.  Abdominal: Bowel sounds are normal.  Musculoskeletal: Normal range of motion. He exhibits no edema.   Neurological: He is alert and oriented to person, place, and time. Coordination normal.  Skin: Skin is warm and dry.  Psychiatric: He has a normal mood and affect. His behavior is normal. Judgment and thought content normal.  Nursing note and vitals reviewed.      Patient has been counseled extensively about nutrition and exercise as well as the importance of adherence with medications and regular follow-up. The patient was given clear instructions to go to ER or return to medical center if symptoms don't improve, worsen or new problems develop. The patient verbalized understanding.   Follow-up: Return in about 1 month (around 02/20/2018) for DM/EKG.   [name], FNP-BC [facility] [place], [place]   01/23/2018, 9:19 AM

## 2018-01-30 ENCOUNTER — Other Ambulatory Visit: Payer: Self-pay | Admitting: Nurse Practitioner

## 2018-01-30 DIAGNOSIS — I10 Essential (primary) hypertension: Secondary | ICD-10-CM

## 2018-01-30 MED FILL — ENALAPRIL MALEATE 20 MG TAB: 20 | 30 days supply | Qty: 30 | Fill #0

## 2018-02-04 MED FILL — !JANUMET 50-1000MG TABLET: 50-1000 | 30 days supply | Qty: 60 | Fill #3

## 2018-02-18 MED FILL — ?ATORVASTATIN 20 MG TABLET: 20 | 30 days supply | Qty: 30 | Fill #4

## 2018-02-18 MED FILL — GLIMEPIRIDE 2 MG TABS: 2 | 30 days supply | Qty: 30 | Fill #3

## 2018-02-28 ENCOUNTER — Ambulatory Visit: Payer: Self-pay | Attending: Nurse Practitioner | Admitting: Nurse Practitioner

## 2018-02-28 ENCOUNTER — Encounter: Payer: Self-pay | Admitting: Nurse Practitioner

## 2018-02-28 ENCOUNTER — Other Ambulatory Visit: Payer: Self-pay

## 2018-02-28 VITALS — BP 144/99 | HR 87 | Temp 98.4°F | Ht 67.0 in | Wt 186.4 lb

## 2018-02-28 DIAGNOSIS — R079 Chest pain, unspecified: Secondary | ICD-10-CM | POA: Insufficient documentation

## 2018-02-28 DIAGNOSIS — Z79899 Other long term (current) drug therapy: Secondary | ICD-10-CM | POA: Insufficient documentation

## 2018-02-28 DIAGNOSIS — Z1211 Encounter for screening for malignant neoplasm of colon: Secondary | ICD-10-CM

## 2018-02-28 DIAGNOSIS — K089 Disorder of teeth and supporting structures, unspecified: Secondary | ICD-10-CM

## 2018-02-28 DIAGNOSIS — Z09 Encounter for follow-up examination after completed treatment for conditions other than malignant neoplasm: Secondary | ICD-10-CM | POA: Insufficient documentation

## 2018-02-28 DIAGNOSIS — I10 Essential (primary) hypertension: Secondary | ICD-10-CM | POA: Insufficient documentation

## 2018-02-28 DIAGNOSIS — Z7984 Long term (current) use of oral hypoglycemic drugs: Secondary | ICD-10-CM | POA: Insufficient documentation

## 2018-02-28 DIAGNOSIS — Z8249 Family history of ischemic heart disease and other diseases of the circulatory system: Secondary | ICD-10-CM | POA: Insufficient documentation

## 2018-02-28 DIAGNOSIS — K0889 Other specified disorders of teeth and supporting structures: Secondary | ICD-10-CM | POA: Insufficient documentation

## 2018-02-28 DIAGNOSIS — E119 Type 2 diabetes mellitus without complications: Secondary | ICD-10-CM | POA: Insufficient documentation

## 2018-02-28 DIAGNOSIS — K219 Gastro-esophageal reflux disease without esophagitis: Secondary | ICD-10-CM | POA: Insufficient documentation

## 2018-02-28 LAB — GLUCOSE, POCT (MANUAL RESULT ENTRY): POC Glucose: 87 mg/dl (ref 70–99)

## 2018-02-28 LAB — POCT GLYCOSYLATED HEMOGLOBIN (HGB A1C): HEMOGLOBIN A1C: 6.5 % — AB (ref 4.0–5.6)

## 2018-02-28 MED ORDER — SITAGLIPTIN PHOS-METFORMIN HCL 50-1000 MG PO TABS: 1.0000 | ORAL_TABLET | Freq: Two times a day (BID) | ORAL | 3 refills | Status: DC

## 2018-02-28 MED ORDER — ENALAPRIL MALEATE 20 MG PO TABS: 40.0000 mg | ORAL_TABLET | Freq: Every day | ORAL | 1 refills | Status: DC

## 2018-02-28 MED ORDER — OMEPRAZOLE 20 MG PO CPDR: 20.0000 mg | DELAYED_RELEASE_CAPSULE | Freq: Two times a day (BID) | ORAL | 3 refills | Status: DC

## 2018-02-28 MED ORDER — GLIMEPIRIDE 2 MG PO TABS: 2.0000 mg | ORAL_TABLET | Freq: Every day | ORAL | 3 refills | Status: DC

## 2018-02-28 MED FILL — ?OMEPRazole 20mg CPDR: 20 | 30 days supply | Qty: 60 | Fill #0

## 2018-02-28 NOTE — Progress Notes (Signed)
Assessment & Plan:  Phoenyx was seen today for follow-up.  Diagnoses and all orders for this visit:  Diabetes mellitus without complication (HCC) -     HgB A1c -     Glucose (CBG) -     sitaGLIPtin-metformin (JANUMET) 50-1000 MG tablet; Take 1 tablet by mouth 2 (two) times daily with a meal. -     glimepiride (AMARYL) 2 MG tablet; Take 1 tablet (2 mg total) by mouth daily before breakfast.  Essential hypertension -     enalapril (VASOTEC) 20 MG tablet; Take 2 tablets (40 mg total) by mouth daily.  Chest pain, unspecified type -     EKG 12-Lead -     Ambulatory referral to Cardiology  Poor dentition -     Ambulatory referral to Dentistry  Colon cancer screening -     Ambulatory referral to Gastroenterology  Gastroesophageal reflux disease, esophagitis presence not specified -     omeprazole (PRILOSEC) 20 MG capsule; Take 1 capsule (20 mg total) by mouth 2 (two) times daily before a meal.    Patient has been counseled on age-appropriate routine health concerns for screening and prevention. These are reviewed and up-to-date. Referrals have been placed accordingly. Immunizations are up-to-date or declined.    Subjective:   Chief Complaint  Patient presents with  . Follow-up    Pt. is here for follow-up on diabetes.    HPI Johanan Langhans 52 y.o. male presents to office today for follow up to DM and HTN. He continues to endorse shortness of breath and lightheadedness with minimal exertion. EKG was normal today.   Type 2 Diabetes Mellitus Disease course has been improving. There are no hypoglycemic symptoms. There are no hypoglycemic complications. Symptoms are stable. There are not any diabetic complications. Risk factors for coronary artery disease include family history, dyslipidemia, diabetes mellitus, hypertension, gender and stress. Current diabetic treatment includes Glimepiride 81m daily and Janumet 50-1000 mg BID . Patient is compliant with treatment all of the time and  monitors blood glucose at home 5 times per day.   Home blood glucose trend : (FBS  80-1248mdl)  (Postprandial 160-190) Weight is  stable. Patient follows a generally healthy diet. Meal planning includes avoidance of concentrated sweets. Patient has not seen a dietician. Patient is compliant with exercise.   An ACE inhibitor/angiotensin II receptor blocker is being taken. Patient does not see a podiatrist. Eye exam is not current.  Lab Results  Component Value Date   HGBA1C 6.5 (A) 02/28/2018   Lab Results  Component Value Date   HGBA1C 10.9 (A) 11/11/2017   CHRONIC HYPERTENSION Disease Monitoring  Blood pressure range: Does not check his blood pressure at home.   Chest pain: yes   Dyspnea: yes   Claudication: no  Medication compliance: yes; taking enalapril 2091mMedication Side Effects  Lightheadedness: yes with shortness of breath (currently denies)   Urinary frequency: no   Edema: no   Impotence: no  Preventitive Healthcare:  Exercise: yes   Diet Pattern: salt not added to cooking  Salt Restriction:  no  BP Readings from Last 3 Encounters:  02/28/18 (!) 144/99  01/23/18 136/88  12/24/17 (!) 206/119   Chest Pain Patient complains of intermittent chest pain. Onset was on and off for several months, with unchanged course since that time. He has declined cardiology consult in the past and most recently as his last office visit with me. The patient describes the pain as intermittent, stable, pressure like in nature,  does not radiatePatient rates pain as a 5/10 in intensity.  Associated symptoms are dyspnea and exertional chest pressure/discomfort. Aggravating factors are none.  Alleviating factors are: rest. Patient's cardiac risk factors are diabetes mellitus, dyslipidemia, family history of premature cardiovascular disease, hypertension and male gender.  Patient's risk factors for DVT/PE: none. Previous cardiac testing: electrocardiogram (ECG), HDL, kidney function, LDL, potassium,  thyroid function and triglycerides.  Review of Systems  Constitutional: Negative for fever, malaise/fatigue and weight loss.  HENT: Negative.  Negative for nosebleeds.   Eyes: Negative.  Negative for blurred vision, double vision and photophobia.  Respiratory: Positive for shortness of breath (with exertion). Negative for cough.   Cardiovascular: Positive for chest pain (with exertion). Negative for palpitations and leg swelling.  Gastrointestinal: Negative.  Negative for heartburn, nausea and vomiting.  Musculoskeletal: Negative.  Negative for myalgias.  Neurological: Negative.  Negative for dizziness, focal weakness, seizures and headaches.  Psychiatric/Behavioral: Negative.  Negative for suicidal ideas.    Past Medical History:  Diagnosis Date  . Diabetes mellitus without complication ([place])   . History of prediabetes   . Hypertension   . MRSA (methicillin resistant Staphylococcus aureus)     Past Surgical History:  Procedure Laterality Date  . BACK SURGERY    . LAPAROSCOPIC GASTRIC BANDING      Family History  Problem Relation Age of Onset  . Heart disease Mother   . Hypertension Mother   . Diabetes Sister   . Hypertension Sister   . Stroke Brother   . Diabetes Brother   . Heart disease Brother   . Heart attack Brother   . Hypertension Brother   . Hypertension Father     Social History Reviewed with no changes to be made today.   Outpatient Medications Prior to Visit  Medication Sig Dispense Refill  . atorvastatin (LIPITOR) 20 MG tablet Take 1 tablet (20 mg total) by mouth daily. 90 tablet 3  . Blood Glucose Monitoring Suppl (TRUE METRIX METER) w/Device KIT Use as instructed 1 kit 0  . glucose blood (TRUE METRIX BLOOD GLUCOSE TEST) test strip Use as instructed 100 each 12  . TRUEPLUS LANCETS 28G MISC Use as instructed 100 each 3  . enalapril (VASOTEC) 20 MG tablet TAKE 1 TABLET BY MOUTH DAILY. 90 tablet 0  . glimepiride (AMARYL) 2 MG tablet Take 1 tablet (2 mg  total) by mouth daily before breakfast. 90 tablet 3  . omeprazole (PRILOSEC) 40 MG capsule Take 40 mg by mouth 2 (two) times daily.     . sitaGLIPtin-metformin (JANUMET) 50-1000 MG tablet Take 1 tablet by mouth 2 (two) times daily with a meal. 60 tablet 3  . acetaminophen (TYLENOL) 500 MG tablet Take 4,000 mg by mouth. EVERY 3 TO 4 HOURS AS NEEDED FOR TOOTHACHE PAIN    . HYDROcodone-acetaminophen (NORCO/VICODIN) 5-325 MG tablet Take 1-2 tablets by mouth every 6 (six) hours as needed for severe pain. (Patient not taking: Reported on 01/23/2018) 8 tablet 0  . pneumococcal 23 valent vaccine (PNEUMOVAX 23) 25 MCG/0.5ML injection To be administered by the Pharmacist (Patient not taking: Reported on 02/28/2018) 0.5 mL 0   No facility-administered medications prior to visit.     No Known Allergies     Objective:    BP (!) 144/99 (BP Location: Right Arm, Patient Position: Sitting, Cuff Size: Normal)   Pulse 87   Temp 98.4 F (36.9 C) (Oral)   Ht 5' 7"  (1.702 m)   Wt 186 lb 6.4 oz (84.6 kg)  SpO2 98%   BMI 29.19 kg/m  Wt Readings from Last 3 Encounters:  02/28/18 186 lb 6.4 oz (84.6 kg)  01/23/18 187 lb 3.2 oz (84.9 kg)  11/11/17 184 lb 9.6 oz (83.7 kg)    Physical Exam  Constitutional: He is oriented to person, place, and time. He appears well-developed and well-nourished. He is cooperative.  HENT:  Head: Normocephalic and atraumatic.  Mouth/Throat: He does not have dentures. No oral lesions. Abnormal dentition. Dental caries present. No dental abscesses.  Eyes: EOM are normal.  Neck: Normal range of motion.  Cardiovascular: Normal rate, regular rhythm, normal heart sounds and intact distal pulses. Exam reveals no gallop and no friction rub.  No murmur heard. Pulmonary/Chest: Effort normal and breath sounds normal. No tachypnea. No respiratory distress. He has no decreased breath sounds. He has no wheezes. He has no rhonchi. He has no rales. He exhibits no tenderness.  Abdominal: Soft.  Bowel sounds are normal.  Musculoskeletal: Normal range of motion. He exhibits no edema.  Feet:  Right Foot:  Protective Sensation: 10 sites tested. 10 sites sensed.  Skin Integrity: Positive for callus and dry skin. Negative for skin breakdown.  Left Foot:  Protective Sensation: 10 sites tested. 10 sites sensed.  Skin Integrity: Positive for callus and dry skin. Negative for skin breakdown.  Neurological: He is alert and oriented to person, place, and time. Coordination normal.  Skin: Skin is warm and dry.  Psychiatric: He has a normal mood and affect. His behavior is normal. Judgment and thought content normal.  Nursing note and vitals reviewed.      Patient has been counseled extensively about nutrition and exercise as well as the importance of adherence with medications and regular follow-up. The patient was given clear instructions to go to ER or return to medical center if symptoms don't improve, worsen or new problems develop. The patient verbalized understanding.   Follow-up: Return in about 3 months (around 05/31/2018) for HTN/HPL/DM.   [name], FNP-BC [facility] [place], [place]   02/28/2018, 10:59 AM

## 2018-02-28 NOTE — Progress Notes (Signed)
[phone] 

## 2018-02-28 NOTE — Patient Instructions (Addendum)

## 2018-03-03 MED FILL — ENALAPRIL MALEATE 20 MG TAB: 20 | 30 days supply | Qty: 60 | Fill #0

## 2018-03-06 MED FILL — !JANUMET 50-1000MG TABLET: 50-1000 | 30 days supply | Qty: 60 | Fill #0

## 2018-03-10 ENCOUNTER — Ambulatory Visit (HOSPITAL_COMMUNITY)
Admission: EM | Admit: 2018-03-10 | Discharge: 2018-03-10 | Disposition: A | Discharge status: Home / Self Care | Payer: Self-pay | Attending: Family Medicine | Admitting: Family Medicine

## 2018-03-10 ENCOUNTER — Encounter (HOSPITAL_COMMUNITY): Payer: Self-pay | Admitting: Emergency Medicine

## 2018-03-10 ENCOUNTER — Other Ambulatory Visit: Payer: Self-pay

## 2018-03-10 DIAGNOSIS — L03811 Cellulitis of head [any part, except face]: Secondary | ICD-10-CM

## 2018-03-10 MED ORDER — DOXYCYCLINE HYCLATE 100 MG PO CAPS: 100.0000 mg | ORAL_CAPSULE | Freq: Two times a day (BID) | ORAL | 0 refills | Status: DC

## 2018-03-10 MED FILL — DOXYCYCLINE HYCLATE 100 MG: 100 | 10 days supply | Qty: 20 | Fill #0

## 2018-03-10 NOTE — Discharge Instructions (Signed)
Start doxycycline as directed. Warm compress. Monitor for spreading redness, increased warmth, fever, swelling, follow up for reevaluation needed.

## 2018-03-10 NOTE — ED Triage Notes (Signed)
Pt has a long hx of MRSA.  States he believes he is having an outbreak in his right ear.

## 2018-03-10 NOTE — ED Provider Notes (Signed)
[facility]    CSN: [id number] Arrival date & time: 03/10/18  8088     History   Chief Complaint Chief Complaint  Patient presents with  . Ear Problem    right    HPI Kevin [name] is a 52 y.o. male.   52 year old male with history of diabetes, HTN, MRSA comes in for right ear pain that started 3 days ago.  States pain is of the external ear.  States area started draining on own 2 days ago. He denies fever, chills, night sweats. Denies wound/injury/trauma. States had this happen once in the past since diagnosis of MRSA. He has been taking tylenol for the pain.      Past Medical History:  Diagnosis Date  . Diabetes mellitus without complication ([place])   . History of prediabetes   . Hypertension   . MRSA (methicillin resistant Staphylococcus aureus)     Patient Active Problem List   Diagnosis Date Noted  . Diabetes mellitus without complication ([place]) 07/13/1593  . Essential hypertension 11/11/2017    Past Surgical History:  Procedure Laterality Date  . BACK SURGERY    . LAPAROSCOPIC GASTRIC BANDING         Home Medications    Prior to Admission medications   Medication Sig Start Date End Date Taking? Authorizing Provider  acetaminophen (TYLENOL) 500 MG tablet Take 4,000 mg by mouth. EVERY 3 TO 4 HOURS AS NEEDED FOR TOOTHACHE PAIN   Yes [provider]  atorvastatin (LIPITOR) 20 MG tablet Take 1 tablet (20 mg total) by mouth daily. 10/27/17  Yes [name], NP  Blood Glucose Monitoring Suppl (TRUE METRIX METER) w/Device KIT Use as instructed 10/23/17  Yes [name], NP  enalapril (VASOTEC) 20 MG tablet Take 2 tablets (40 mg total) by mouth daily. 02/28/18 05/29/18 Yes [name], NP  glimepiride (AMARYL) 2 MG tablet Take 1 tablet (2 mg total) by mouth daily before breakfast. 02/28/18  Yes [name], NP  glucose blood (TRUE METRIX BLOOD GLUCOSE TEST) test strip Use as instructed 10/23/17  Yes [name], NP    HYDROcodone-acetaminophen (NORCO/VICODIN) 5-325 MG tablet Take 1-2 tablets by mouth every 6 (six) hours as needed for severe pain. 12/24/17  Yes Amyot, Nicholes Stairs, NP  omeprazole (PRILOSEC) 20 MG capsule Take 1 capsule (20 mg total) by mouth 2 (two) times daily before a meal. 02/28/18 05/29/18 Yes [name], NP  pneumococcal 23 valent vaccine (PNEUMOVAX 23) 25 MCG/0.5ML injection To be administered by the Pharmacist 11/26/17  Yes Tresa Garter, MD  sitaGLIPtin-metformin (JANUMET) 50-1000 MG tablet Take 1 tablet by mouth 2 (two) times daily with a meal. 02/28/18  Yes [name], NP  TRUEPLUS LANCETS 28G MISC Use as instructed 10/23/17  Yes [name], NP  doxycycline (VIBRAMYCIN) 100 MG capsule Take 1 capsule (100 mg total) by mouth 2 (two) times daily. 03/10/18   Ok Edwards, PA-C    Family History Family History  Problem Relation Age of Onset  . Heart disease Mother   . Hypertension Mother   . Diabetes Sister   . Hypertension Sister   . Stroke Brother   . Diabetes Brother   . Heart disease Brother   . Heart attack Brother   . Hypertension Brother   . Hypertension Father     Social History Social History   Tobacco Use  . Smoking status: Former Smoker    Last attempt to quit: 2000  Years since quitting: 19.5  . Smokeless tobacco: Never Used  Substance Use Topics  . Alcohol use: No  . Drug use: No     Allergies   Patient has no known allergies.   Review of Systems Review of Systems  Reason unable to perform ROS: See HPI as above.     Physical Exam Triage Vital Signs ED Triage Vitals  Enc Vitals Group     BP 03/10/18 1012 (!) 173/110     Pulse Rate 03/10/18 1012 (!) 112     Resp --      Temp 03/10/18 1012 98.4 F (36.9 C)     Temp Source 03/10/18 1012 Oral     SpO2 03/10/18 1012 98 %     Weight --      Height --      Head Circumference --      Peak Flow --      Pain Score 03/10/18 1011 6     Pain Loc --      Pain Edu? --      Excl. in  [place]? --    No data found.  Updated Vital Signs BP (!) 173/110 (BP Location: Left Arm)   Pulse (!) 112   Temp 98.4 F (36.9 C) (Oral)   SpO2 98%   Physical Exam  Constitutional: He is oriented to person, place, and time. He appears well-developed and well-nourished. No distress.  HENT:  Head: Normocephalic and atraumatic.  Right Ear: Tympanic membrane and ear canal normal. Tympanic membrane is not erythematous and not bulging.  Left Ear: Tympanic membrane, external ear and ear canal normal. Tympanic membrane is not erythematous and not bulging.  Ears:  Eyes: Pupils are equal, round, and reactive to light. Conjunctivae are normal.  Neurological: He is alert and oriented to person, place, and time.  Skin: He is not diaphoretic.     UC Treatments / Results  Labs (all labs ordered are listed, but only abnormal results are displayed) Labs Reviewed - No data to display  Lab Results  Component Value Date   HGBA1C 6.5 (A) 02/28/2018    EKG None  Radiology No results found.  Procedures Procedures (including critical care time)  Medications Ordered in UC Medications - No data to display  Initial Impression / Assessment and Plan / UC Course  I have reviewed the triage vital signs and the nursing notes.  Pertinent labs & imaging results that were available during my care of the patient were reviewed by me and considered in my medical decision making (see chart for details).    Will treat for cellulitis with doxycycline. Warm compress. Return precautions given. Patient expresses understanding and agrees to plan.  Final Clinical Impressions(s) / UC Diagnoses   Final diagnoses:  Cellulitis of head except face    ED Prescriptions    Medication Sig Dispense Auth. Provider   doxycycline (VIBRAMYCIN) 100 MG capsule Take 1 capsule (100 mg total) by mouth 2 (two) times daily. 20 capsule Tobin Chad, [place] 03/10/18 1030

## 2018-03-18 ENCOUNTER — Encounter: Payer: Self-pay | Admitting: Cardiovascular Disease

## 2018-03-18 ENCOUNTER — Ambulatory Visit (INDEPENDENT_AMBULATORY_CARE_PROVIDER_SITE_OTHER): Payer: Self-pay | Admitting: Cardiovascular Disease

## 2018-03-18 VITALS — BP 165/105 | HR 88 | Ht 67.0 in | Wt 185.8 lb

## 2018-03-18 DIAGNOSIS — R0609 Other forms of dyspnea: Secondary | ICD-10-CM

## 2018-03-18 DIAGNOSIS — R079 Chest pain, unspecified: Secondary | ICD-10-CM | POA: Insufficient documentation

## 2018-03-18 DIAGNOSIS — E785 Hyperlipidemia, unspecified: Secondary | ICD-10-CM | POA: Insufficient documentation

## 2018-03-18 DIAGNOSIS — E1169 Type 2 diabetes mellitus with other specified complication: Secondary | ICD-10-CM

## 2018-03-18 DIAGNOSIS — R072 Precordial pain: Secondary | ICD-10-CM

## 2018-03-18 MED ORDER — ATORVASTATIN CALCIUM 40 MG PO TABS: 40.0000 mg | ORAL_TABLET | Freq: Every day | ORAL | 3 refills | Status: DC

## 2018-03-18 MED FILL — ?ATORVASTATIN 40MG TABLET: 40 | 30 days supply | Qty: 30 | Fill #0

## 2018-03-18 NOTE — Assessment & Plan Note (Signed)
She of hyperlipidemia on statin therapy with lipid profile performed 10/23/2017 revealing an LDL of 129 and HDL 42.  I am going to increase his atorvastatin from 20 to 40 mg and will recheck.

## 2018-03-18 NOTE — Progress Notes (Signed)
03/18/2018 Archer Kasson   [birth year]  [id number]  Primary Physician [name], NP Primary Cardiologist: Lorretta Harp MD Lupe Carney, [place]  HPI:  Kevin [name] is a 52 y.o. mildly overweight divorced Caucasian male father of 3 non-biologic children who lives with his significant other.  Referred by [name], NP for cardiovascular evaluation because of dyspnea and chest pain.  His risk factors include treated diabetes, hypertension hyperlipidemia.  Brother did die myocardial infarction at age 86.  He has never had a heart attack or stroke.  He had heart catheterization in 1999 which was essentially normal.  Over the last 3 months he has had progressive dyspnea and exertional chest pressure.  Because of financial issues, he has not been on any medications for the last 10 years and only recently began treating his high blood pressure.   Current Meds  Medication Sig  . acetaminophen (TYLENOL) 500 MG tablet Take 4,000 mg by mouth. EVERY 3 TO 4 HOURS AS NEEDED FOR TOOTHACHE PAIN  . atorvastatin (LIPITOR) 40 MG tablet Take 1 tablet (40 mg total) by mouth daily.  . Blood Glucose Monitoring Suppl (TRUE METRIX METER) w/Device KIT Use as instructed  . enalapril (VASOTEC) 20 MG tablet Take 2 tablets (40 mg total) by mouth daily.  Marland Kitchen glimepiride (AMARYL) 2 MG tablet Take 1 tablet (2 mg total) by mouth daily before breakfast.  . glucose blood (TRUE METRIX BLOOD GLUCOSE TEST) test strip Use as instructed  . omeprazole (PRILOSEC) 20 MG capsule Take 1 capsule (20 mg total) by mouth 2 (two) times daily before a meal.  . pneumococcal 23 valent vaccine (PNEUMOVAX 23) 25 MCG/0.5ML injection To be administered by the Pharmacist  . sitaGLIPtin-metformin (JANUMET) 50-1000 MG tablet Take 1 tablet by mouth 2 (two) times daily with a meal.  . TRUEPLUS LANCETS 28G MISC Use as instructed  . [DISCONTINUED] atorvastatin (LIPITOR) 20 MG tablet Take 1 tablet (20 mg total) by mouth daily.  .  [DISCONTINUED] doxycycline (VIBRAMYCIN) 100 MG capsule Take 1 capsule (100 mg total) by mouth 2 (two) times daily.  . [DISCONTINUED] HYDROcodone-acetaminophen (NORCO/VICODIN) 5-325 MG tablet Take 1-2 tablets by mouth every 6 (six) hours as needed for severe pain.     No Known Allergies  Social History   Socioeconomic History  . Marital status: Divorced    Spouse name: Not on file  . Number of children: Not on file  . Years of education: Not on file  . Highest education level: Not on file  Occupational History  . Not on file  Social Needs  . Financial resource strain: Not on file  . Food insecurity:    Worry: Not on file    Inability: Not on file  . Transportation needs:    Medical: Not on file    Non-medical: Not on file  Tobacco Use  . Smoking status: Former Smoker    Last attempt to quit: 2000    Years since quitting: 19.5  . Smokeless tobacco: Never Used  Substance and Sexual Activity  . Alcohol use: No  . Drug use: No  . Sexual activity: Yes  Lifestyle  . Physical activity:    Days per week: Not on file    Minutes per session: Not on file  . Stress: Not on file  Relationships  . Social connections:    Talks on phone: Not on file    Gets together: Not on file    Attends religious service: Not on file  Active member of club or organization: Not on file    Attends meetings of clubs or organizations: Not on file    Relationship status: Not on file  . Intimate partner violence:    Fear of current or ex partner: Not on file    Emotionally abused: Not on file    Physically abused: Not on file    Forced sexual activity: Not on file  Other Topics Concern  . Not on file  Social History Narrative  . Not on file     Review of Systems: General: negative for chills, fever, night sweats or weight changes.  Cardiovascular: negative for chest pain, dyspnea on exertion, edema, orthopnea, palpitations, paroxysmal nocturnal dyspnea or shortness of breath Dermatological:  negative for rash Respiratory: negative for cough or wheezing Urologic: negative for hematuria Abdominal: negative for nausea, vomiting, diarrhea, bright red blood per rectum, melena, or hematemesis Neurologic: negative for visual changes, syncope, or dizziness All other systems reviewed and are otherwise negative except as noted above.    Blood pressure (!) 165/105, pulse 88, height 5' 7" (1.702 m), weight 185 lb 12.8 oz (84.3 kg).  General appearance: alert and no distress Neck: no adenopathy, no carotid bruit, no JVD, supple, symmetrical, trachea midline and thyroid not enlarged, symmetric, no tenderness/mass/nodules Lungs: clear to auscultation bilaterally Heart: regular rate and rhythm, S1, S2 normal, no murmur, click, rub or gallop Extremities: extremities normal, atraumatic, no cyanosis or edema Pulses: 2+ and symmetric Skin: Skin color, texture, turgor normal. No rashes or lesions Neurologic: Alert and oriented X 3, normal strength and tone. Normal symmetric reflexes. Normal coordination and gait  EKG not performed today  ASSESSMENT AND PLAN:   Essential hypertension History of essential hypertension her blood pressure measured today at 165/105.  He is on enalapril 20 mg a day.  He does not avoid salt.  He may need further antihypertensive pharmacotherapy.  Hyperlipidemia She of hyperlipidemia on statin therapy with lipid profile performed 10/23/2017 revealing an LDL of 129 and HDL 42.  I am going to increase his atorvastatin from 20 to 40 mg and will recheck.  Dyspnea on exertion New onset dyspnea on exertion over the last 3 months which has been getting progressively worse .  We will obtain a 2D echocardiogram.  Chest pain New onset exertional chest pain along with dyspnea over the last 3 months.  His brother did have a myocardial infarction at age 9.  Other risk factors include remote tobacco abuse, treated hyper lipidemia, hypertension and diabetes.  I am going to get a  pharmacologic Myoview stress test to further evaluate.  He is unable to exercise because of his back.      Lorretta Harp MD FACP,FACC,FAHA, [facility] 03/18/2018 11:25 AM

## 2018-03-18 NOTE — Assessment & Plan Note (Signed)
New onset exertional chest pain along with dyspnea over the last 3 months.  His brother did have a myocardial infarction at age 52.  Other risk factors include remote tobacco abuse, treated hyper lipidemia, hypertension and diabetes.  I am going to get a pharmacologic Myoview stress test to further evaluate.  He is unable to exercise because of his back.

## 2018-03-18 NOTE — Patient Instructions (Signed)
Medication Instructions:   INCREASE ATORVASTATIN TO 40 MG ONCE DAILY= 2 OF THE 20 MG TABLETS ONCE DAILY  Labwork:  Your physician recommends that you return for lab work in: 2 MONTHS PRIOR TO EATING  Testing/Procedures:  Your physician has requested that you have an echocardiogram. Echocardiography is a painless test that uses sound waves to create images of your heart. It provides your doctor with information about the size and shape of your heart and how well your heart's chambers and valves are working. This procedure takes approximately one hour. There are no restrictions for this procedure.   Your physician has requested that you have a lexiscan myoview. For further information please visit [email or web address]. Please follow instruction sheet, as given.    Follow-Up:  Your physician recommends that you schedule a follow-up appointment in: WITH DR BERRY AFTER TESTING COMPLETE

## 2018-03-18 NOTE — Assessment & Plan Note (Signed)
New onset dyspnea on exertion over the last 3 months which has been getting progressively worse .  We will obtain a 2D echocardiogram.

## 2018-03-18 NOTE — Assessment & Plan Note (Signed)
History of essential hypertension her blood pressure measured today at 165/105.  He is on enalapril 20 mg a day.  He does not avoid salt.  He may need further antihypertensive pharmacotherapy.

## 2018-03-19 ENCOUNTER — Telehealth (HOSPITAL_COMMUNITY): Payer: Self-pay

## 2018-03-19 MED FILL — GLIMEPIRIDE 2 MG TABS: 2 | 30 days supply | Qty: 30 | Fill #4

## 2018-03-19 NOTE — Telephone Encounter (Signed)
Encounter complete. 

## 2018-03-21 ENCOUNTER — Ambulatory Visit (HOSPITAL_COMMUNITY)
Admission: RE | Admit: 2018-03-21 | Discharge: 2018-03-21 | Disposition: A | Discharge status: Home / Self Care | Payer: Self-pay | Source: Ambulatory Visit | Attending: Cardiology | Admitting: Cardiology

## 2018-03-21 DIAGNOSIS — R072 Precordial pain: Secondary | ICD-10-CM | POA: Insufficient documentation

## 2018-03-21 LAB — MYOCARDIAL PERFUSION IMAGING
CHL CUP NUCLEAR SDS: 4
LV dias vol: 92 mL (ref 62–150)
LV sys vol: 47 mL
NUC STRESS TID: 1.18
Peak HR: 107 {beats}/min
Rest HR: 75 {beats}/min
SRS: 0
SSS: 4

## 2018-03-21 MED ORDER — TECHNETIUM TC 99M TETROFOSMIN IV KIT
10.8000 | PACK | Freq: Once | INTRAVENOUS | Status: AC | PRN
  Administered 2018-03-21: 10.8 via INTRAVENOUS
  Filled 2018-03-21: qty 11

## 2018-03-21 MED ORDER — REGADENOSON 0.4 MG/5ML IV SOLN
0.4000 mg | Freq: Once | INTRAVENOUS | Status: AC
  Administered 2018-03-21: 0.4 mg via INTRAVENOUS

## 2018-03-21 MED ORDER — TECHNETIUM TC 99M TETROFOSMIN IV KIT
30.8000 | PACK | Freq: Once | INTRAVENOUS | Status: AC | PRN
  Administered 2018-03-21: 30.8 via INTRAVENOUS
  Filled 2018-03-21: qty 31

## 2018-03-24 ENCOUNTER — Ambulatory Visit (HOSPITAL_COMMUNITY)
Admission: EM | Admit: 2018-03-24 | Discharge: 2018-03-24 | Disposition: A | Discharge status: Home / Self Care | Payer: Self-pay | Attending: Family Medicine | Admitting: Family Medicine

## 2018-03-24 ENCOUNTER — Encounter (HOSPITAL_COMMUNITY): Payer: Self-pay | Admitting: Emergency Medicine

## 2018-03-24 ENCOUNTER — Other Ambulatory Visit: Payer: Self-pay

## 2018-03-24 DIAGNOSIS — L247 Irritant contact dermatitis due to plants, except food: Secondary | ICD-10-CM

## 2018-03-24 MED ORDER — METHYLPREDNISOLONE SODIUM SUCC 125 MG IJ SOLR
INTRAMUSCULAR | Status: AC
  Filled 2018-03-24: qty 2

## 2018-03-24 MED ORDER — METHYLPREDNISOLONE SODIUM SUCC 125 MG IJ SOLR
80.0000 mg | Freq: Once | INTRAMUSCULAR | Status: AC
  Administered 2018-03-24: 80 mg via INTRAMUSCULAR

## 2018-03-24 MED ORDER — SILVER SULFADIAZINE 1 % EX CREA
TOPICAL_CREAM | Freq: Two times a day (BID) | CUTANEOUS | Status: DC
  Administered 2018-03-24: 16:00:00 via TOPICAL

## 2018-03-24 MED ORDER — SILVER SULFADIAZINE 1 % EX CREA: 1.0000 "application " | TOPICAL_CREAM | Freq: Every day | CUTANEOUS | 0 refills | Status: DC

## 2018-03-24 NOTE — Discharge Instructions (Addendum)
It was nice meeting you!!  I believe you have a severe case of contact dermatitis.  We will give you a steroid shot here and then place some silvadene cream on the wound for the burning and dress.  I will send you a prescription for the silvadene cream to your pharmacy.  Please monitor the site for worsening, redness, drainage or spreading.  Return as needed.

## 2018-03-24 NOTE — ED Provider Notes (Signed)
[place]    CSN: [id number] Arrival date & time: 03/24/18  1413     History   Chief Complaint Chief Complaint  Patient presents with  . Rash    R forearm    HPI Kevin [name] is a 52 y.o. male.   Patient is a 52 year old male with past history of diabetes, hypertension, and MRSA.  He reports a rash that started 1 week ago to right forearm.  He describes this rash as his typical poison ivy rash.  The rash has progressively gotten worse to the lower forearm with severe tenderness, burning, redness and drainage.  He denies any fever, chills, body aches, fatigue.  He has been keeping the rash covered with gauze and reports within a few hours soaking through the gauze with drainage.   ROS per HPI      Past Medical History:  Diagnosis Date  . Diabetes mellitus without complication ([place])   . History of prediabetes   . Hypertension   . MRSA (methicillin resistant Staphylococcus aureus)     Patient Active Problem List   Diagnosis Date Noted  . Hyperlipidemia 03/18/2018  . Dyspnea on exertion 03/18/2018  . Chest pain 03/18/2018  . Diabetes mellitus without complication ([place]) 07/07/2535  . Essential hypertension 11/11/2017    Past Surgical History:  Procedure Laterality Date  . BACK SURGERY    . LAPAROSCOPIC GASTRIC BANDING         Home Medications    Prior to Admission medications   Medication Sig Start Date End Date Taking? Authorizing Provider  acetaminophen (TYLENOL) 500 MG tablet Take 4,000 mg by mouth. EVERY 3 TO 4 HOURS AS NEEDED FOR TOOTHACHE PAIN   Yes [provider]  atorvastatin (LIPITOR) 40 MG tablet Take 1 tablet (40 mg total) by mouth daily. 03/18/18  Yes Lorretta Harp, MD  Blood Glucose Monitoring Suppl (TRUE METRIX METER) w/Device KIT Use as instructed 10/23/17  Yes [name], NP  enalapril (VASOTEC) 20 MG tablet Take 2 tablets (40 mg total) by mouth daily. 02/28/18 05/29/18 Yes [name], NP  glimepiride  (AMARYL) 2 MG tablet Take 1 tablet (2 mg total) by mouth daily before breakfast. 02/28/18  Yes [name], NP  glucose blood (TRUE METRIX BLOOD GLUCOSE TEST) test strip Use as instructed 10/23/17  Yes [name], NP  omeprazole (PRILOSEC) 20 MG capsule Take 1 capsule (20 mg total) by mouth 2 (two) times daily before a meal. 02/28/18 05/29/18 Yes [name], NP  pneumococcal 23 valent vaccine (PNEUMOVAX 23) 25 MCG/0.5ML injection To be administered by the Pharmacist 11/26/17  Yes Tresa Garter, MD  sitaGLIPtin-metformin (JANUMET) 50-1000 MG tablet Take 1 tablet by mouth 2 (two) times daily with a meal. 02/28/18  Yes [name], NP  TRUEPLUS LANCETS 28G MISC Use as instructed 10/23/17  Yes [name], NP  silver sulfADIAZINE (SILVADENE) 1 % cream Apply 1 application topically daily. 03/24/18   Orvan July, NP    Family History Family History  Problem Relation Age of Onset  . Heart disease Mother   . Hypertension Mother   . Diabetes Sister   . Hypertension Sister   . Stroke Brother   . Diabetes Brother   . Heart disease Brother   . Heart attack Brother   . Hypertension Brother   . Hypertension Father     Social History Social History   Tobacco Use  . Smoking status: Former  Smoker    Last attempt to quit: 2000    Years since quitting: 19.5  . Smokeless tobacco: Never Used  Substance Use Topics  . Alcohol use: No  . Drug use: No     Allergies   Patient has no known allergies.   Review of Systems Review of Systems   Physical Exam Triage Vital Signs ED Triage Vitals  Enc Vitals Group     BP 03/24/18 1458 (!) 185/108     Pulse Rate 03/24/18 1458 99     Resp --      Temp --      Temp src --      SpO2 03/24/18 1458 99 %     Weight --      Height --      Head Circumference --      Peak Flow --      Pain Score 03/24/18 1456 8     Pain Loc --      Pain Edu? --      Excl. in [place]? --    No data found.  Updated Vital Signs BP (!)  160/103 (BP Location: Left Arm)   Pulse 88   Temp 98.3 F (36.8 C) (Oral)   Resp 18   SpO2 99%   Visual Acuity Right Eye Distance:   Left Eye Distance:   Bilateral Distance:    Right Eye Near:   Left Eye Near:    Bilateral Near:     Physical Exam  Constitutional: He is oriented to person, place, and time. He appears well-developed and well-nourished.  HENT:  Head: Normocephalic and atraumatic.  Eyes: Conjunctivae are normal.  Neck: Normal range of motion. Neck supple.  Cardiovascular: Normal rate and regular rhythm.  Neurological: He is alert and oriented to person, place, and time.  Skin: Skin is warm and dry. Capillary refill takes less than 2 seconds.  Edematous, erythematous area to right lower forearm. Weeping and drainage of serous fluid. Very tender and warm to touch. Healed vesicle to right AC.   Psychiatric: He has a normal mood and affect.  Nursing note and vitals reviewed.    UC Treatments / Results  Labs (all labs ordered are listed, but only abnormal results are displayed) Labs Reviewed - No data to display  EKG None  Radiology No results found.  Procedures Procedures (including critical care time)  Medications Ordered in UC Medications  silver sulfADIAZINE (SILVADENE) 1 % cream ( Topical Given 03/24/18 1627)  methylPREDNISolone sodium succinate (SOLU-MEDROL) 125 mg/2 mL injection 80 mg (80 mg Intramuscular Given 03/24/18 1619)    Initial Impression / Assessment and Plan / UC Course  I have reviewed the triage vital signs and the nursing notes.  Pertinent labs & imaging results that were available during my care of the patient were reviewed by me and considered in my medical decision making (see chart for details).     Most likely contact dermatitis from irritant that is severely inflammed. Treating with steroid injection in the clinic and silvadene cream for the burning with dresing. Told to follow up immediately if symptoms worsen and the wound  begins to look infected or is spreading.  Final Clinical Impressions(s) / UC Diagnoses   Final diagnoses:  Irritant contact dermatitis due to plants, except food     Discharge Instructions     It was nice meeting you!!  I believe you have a severe case of contact dermatitis.  We will give you a steroid shot  here and then place some silvadene cream on the wound for the burning and dress.  I will send you a prescription for the silvadene cream to your pharmacy.  Please monitor the site for worsening, redness, drainage or spreading.  Return as needed.     ED Prescriptions    Medication Sig Dispense Auth. Provider   silver sulfADIAZINE (SILVADENE) 1 % cream Apply 1 application topically daily. 50 g Loura Halt A, NP     Controlled Substance Prescriptions [place] Controlled Substance Registry consulted? Not Applicable   Orvan July, NP 03/24/18 [phone]

## 2018-03-24 NOTE — ED Triage Notes (Signed)
Pt reports having possible poison ivy on his right forearm that started 2-3 days ago.  Pt has it covered with coband.

## 2018-03-25 MED FILL — SSD 1% CREAM: 1 | 20 days supply | Qty: 50 | Fill #0

## 2018-03-26 ENCOUNTER — Ambulatory Visit (HOSPITAL_COMMUNITY): Payer: Self-pay | Attending: Cardiology

## 2018-03-26 ENCOUNTER — Other Ambulatory Visit: Payer: Self-pay

## 2018-03-26 DIAGNOSIS — I110 Hypertensive heart disease with heart failure: Secondary | ICD-10-CM | POA: Insufficient documentation

## 2018-03-26 DIAGNOSIS — Z87891 Personal history of nicotine dependence: Secondary | ICD-10-CM | POA: Insufficient documentation

## 2018-03-26 DIAGNOSIS — Z8249 Family history of ischemic heart disease and other diseases of the circulatory system: Secondary | ICD-10-CM | POA: Insufficient documentation

## 2018-03-26 DIAGNOSIS — R0600 Dyspnea, unspecified: Secondary | ICD-10-CM | POA: Insufficient documentation

## 2018-03-26 DIAGNOSIS — E785 Hyperlipidemia, unspecified: Secondary | ICD-10-CM | POA: Insufficient documentation

## 2018-03-26 DIAGNOSIS — I371 Nonrheumatic pulmonary valve insufficiency: Secondary | ICD-10-CM | POA: Insufficient documentation

## 2018-03-26 DIAGNOSIS — E119 Type 2 diabetes mellitus without complications: Secondary | ICD-10-CM | POA: Insufficient documentation

## 2018-03-26 DIAGNOSIS — R072 Precordial pain: Secondary | ICD-10-CM

## 2018-03-26 DIAGNOSIS — I509 Heart failure, unspecified: Secondary | ICD-10-CM | POA: Insufficient documentation

## 2018-03-26 MED FILL — ?OMEPRazole 20mg CPDR: 20 | 30 days supply | Qty: 60 | Fill #1

## 2018-03-31 MED FILL — ENALAPRIL MALEATE 20 MG TAB: 20 | 30 days supply | Qty: 60 | Fill #1

## 2018-03-31 MED FILL — !JANUMET 50-1000MG TABLET: 50-1000 | 30 days supply | Qty: 60 | Fill #1

## 2018-04-03 ENCOUNTER — Telehealth: Payer: Self-pay | Admitting: Cardiovascular Disease

## 2018-04-03 NOTE — Telephone Encounter (Signed)
New Message    Patient is calling because at night he is having a crushing pain in his chest. He says that he had a lot of test already and those were normal.He is scheduled to see Dr. Allyson SabalBerry on the 20th. In the meantime he made another appt to come in on the 12th but he does not want to cancel the 20th appointment. Due to him wanting the opinion of Dr. Allyson SabalBerry.  Plus call to discuss.

## 2018-04-03 NOTE — Telephone Encounter (Signed)
Returned call to patient who states he continues to have the same symptoms he was having prior to seeing Dr. Allyson SabalBerry.  He states he is not that concerned because he had normal testing done but he still believes something is going on.   He states he cannot do things he use to do, he becomes SOB, sweats profusely, very fatigued.   He states the last 3 nights when he lays down to go to bed he begins having crushing chest pain, describes as "1000 lbs on my chest".  He states if he gets up this eventually goes away.    He does not think this is reflux as he states he is very familiar with this and this is a different sensation.   He increased his Prilosec and this has not helped.    He request appointment sooner if possible.   Scheduled patient 7/30 at 230 pm with Theodore Demarkhonda Barrett PA.  Patient aware and verbalized understanding.  States he is aware to proceed to ER for worsening symptoms.

## 2018-04-08 ENCOUNTER — Ambulatory Visit (INDEPENDENT_AMBULATORY_CARE_PROVIDER_SITE_OTHER): Payer: Self-pay | Admitting: Physician Assistant

## 2018-04-08 ENCOUNTER — Encounter: Payer: Self-pay | Admitting: Physician Assistant

## 2018-04-08 VITALS — BP 172/102 | HR 87 | Ht 67.0 in | Wt 187.2 lb

## 2018-04-08 DIAGNOSIS — I10 Essential (primary) hypertension: Secondary | ICD-10-CM

## 2018-04-08 DIAGNOSIS — R0609 Other forms of dyspnea: Secondary | ICD-10-CM

## 2018-04-08 DIAGNOSIS — R079 Chest pain, unspecified: Secondary | ICD-10-CM

## 2018-04-08 MED ORDER — AMLODIPINE BESYLATE 5 MG PO TABS: ORAL_TABLET | ORAL | 3 refills | Status: DC

## 2018-04-08 MED FILL — AMLODIPINE BESYLATE 5 MG TA: 5 | 30 days supply | Qty: 45 | Fill #0

## 2018-04-08 NOTE — Patient Instructions (Addendum)
Medication Instructions: Start: Amlodipine 5 mg daily  -Ok to take extra 1/2 tablet (2.5 mg) for systolic ( top number) greater than 180.    If you need a refill on your cardiac medications before your next appointment, please call your pharmacy.   Labwork: None  Procedures/Testing: None  Follow-Up: Your physician wants you to follow-up in  2-3 weeks with [facility]  Your physician recommends that you schedule a follow-up appointment in: 5 months with Dr. Allyson SabalBerry.  Special Instructions: Obtain a Blood Pressure Machine   Thank you for choosing [facility]!!

## 2018-04-08 NOTE — Progress Notes (Signed)
Cardiology Office Note   Date:  04/08/2018   ID:  Kevin [name], DOB 15-May-1966, MRN [id number]  PCP:  [name], NP  Cardiologist: Dr. Gwenlyn Found, 03/18/2018 [name], PA-C   Chief Complaint  Patient presents with  . office visit    pt continues to have same symptoms as first visits, extremely fatigue/SOB noted, had felt chest pressure a couple of nights ago    History of Present Illness: Kevin [name] is a 52 y.o. male with a history of DM, HTN, HLD, FH CAD, nl cors 1999, noncompliant with meds because of financial issues  7/9 office visit for dyspnea on exertion, echocardiogram and Lexiscan Myoview ordered.  EF normal on echo with grade 1 diastolic dysfunction and low risk Myoview with EF 49% but visually appears normal  Kevin [name] presents for cardiology follow up.  The DOE is significant. His activity level has decreased because of fatigue and DOE. 6 months ago, he was able to mow the whole yard at a time.   This has gradually decreased to where he now has to mow part of the front yard and take a break, continuing to do it in stages so he can get it all done.  However, it may take 2 days.   Last week, he was doing the yard and developed tunnel vision. He was able to get to a chair and sat down, felt better and was able to finish the yard.  That night, he had terrible pressure in his chest. It woke him, he took GI meds but they were no help. The same thing happened about 4 nights in a row, but has not happened the last 2 nights.   He has never had resting symptoms. He does not have a BP cuff at home.   4-5 cups of coffee am, 2 pm. No other caffeinated beverages. No tobacco use. No drug use. No ETOH because of his reflux  His BP has been high since 1999 or before. It has never been well-controlled.  His brother has the same problem and has had problems with strokes and has had a heart attack.  He does not want that to happen to him.   Past Medical History:  Diagnosis  Date  . Diabetes mellitus without complication ([place])   . History of prediabetes   . Hypertension   . MRSA (methicillin resistant Staphylococcus aureus)     Past Surgical History:  Procedure Laterality Date  . BACK SURGERY    . LAPAROSCOPIC GASTRIC BANDING      Current Outpatient Medications  Medication Sig Dispense Refill  . acetaminophen (TYLENOL) 500 MG tablet Take 4,000 mg by mouth. EVERY 3 TO 4 HOURS AS NEEDED FOR TOOTHACHE PAIN    . atorvastatin (LIPITOR) 40 MG tablet Take 1 tablet (40 mg total) by mouth daily. 90 tablet 3  . Blood Glucose Monitoring Suppl (TRUE METRIX METER) w/Device KIT Use as instructed 1 kit 0  . enalapril (VASOTEC) 20 MG tablet Take 2 tablets (40 mg total) by mouth daily. 180 tablet 1  . glimepiride (AMARYL) 2 MG tablet Take 1 tablet (2 mg total) by mouth daily before breakfast. 90 tablet 3  . glucose blood (TRUE METRIX BLOOD GLUCOSE TEST) test strip Use as instructed 100 each 12  . omeprazole (PRILOSEC) 20 MG capsule Take 1 capsule (20 mg total) by mouth 2 (two) times daily before a meal. 180 capsule 3  . pneumococcal 23 valent vaccine (PNEUMOVAX 23) 25 MCG/0.5ML injection To be  administered by the Pharmacist 0.5 mL 0  . sitaGLIPtin-metformin (JANUMET) 50-1000 MG tablet Take 1 tablet by mouth 2 (two) times daily with a meal. 60 tablet 3  . TRUEPLUS LANCETS 28G MISC Use as instructed 100 each 3   No current facility-administered medications for this visit.     Allergies:   Patient has no known allergies.    Social History:  The patient  reports that he quit smoking about 19 years ago. He has never used smokeless tobacco. He reports that he does not drink alcohol or use drugs.   Family History:  The patient's family history includes Diabetes in his brother and sister; Heart attack in his brother; Heart disease in his brother and mother; Hypertension in his brother, father, mother, and sister; Stroke in his brother.    ROS:  Please see the history of  present illness. All other systems are reviewed and negative.    PHYSICAL EXAM: VS:  BP (!) 172/102 (BP Location: Left Arm, Patient Position: Sitting)   Pulse 87   Ht 5' 7"  (1.702 m)   Wt 187 lb 3.2 oz (84.9 kg)   BMI 29.32 kg/m  , BMI Body mass index is 29.32 kg/m. GEN: Well nourished, well developed, male in no acute distress  HEENT: normal for age  Neck: no JVD, no carotid bruit, no masses Cardiac: RRR; no murmur, no rubs, or gallops Respiratory:  clear to auscultation bilaterally, normal work of breathing GI: soft, nontender, nondistended, + BS MS: no deformity or atrophy; no edema; distal pulses are 2+ in all 4 extremities   Skin: warm and dry, no rash Neuro:  Strength and sensation are intact Psych: euthymic mood, full affect   EKG:  EKG is ordered today. The ekg ordered today demonstrates sinus rhythm, heart rate 87, no acute ischemic changes, no significant change from 02/28/2018  ECHO: 03/26/2018 - Left ventricle: The cavity size was normal. Systolic function was   normal. The estimated ejection fraction was in the range of 50%   to 55%. Wall motion was normal; there were no regional wall   motion abnormalities. Doppler parameters are consistent with   abnormal left ventricular relaxation (grade 1 diastolic   dysfunction). - Aortic valve: There was no regurgitation. - Mitral valve: There was no regurgitation. - Right ventricle: Systolic function was normal. - Atrial septum: No defect or patent foramen ovale was identified. - Tricuspid valve: There was no significant regurgitation. - Pulmonic valve: There was trivial regurgitation. Impressions: - Normal LV systolic function without wall motion abnormalities. No   significant valvular disease.  MYOVIEW: 03/21/2018  Nuclear stress EF: 49%.  The left ventricular ejection fraction is mildly decreased (45-54%).  The study is normal.  This is a low risk study.  There was no ST segment deviation noted during  stress.  No T wave inversion was noted during stress.   Low risk stress nuclear study with normal perfusion and mildly reduced left ventricular systolic function. Visually estimated left ventricular systolic function appears normal.  Consider comparison with echocardiography.   Recent Labs: 04/30/2017: ALT 17 10/23/2017: BUN 7; Creatinine, Ser 0.91; Hemoglobin 14.2; Platelets 279; Potassium 4.4; Sodium 139; TSH 2.500    Lipid Panel    Component Value Date/Time   CHOL 197 10/23/2017 1046   TRIG 131 10/23/2017 1046   HDL 42 10/23/2017 1046   CHOLHDL 4.7 10/23/2017 1046   LDLCALC 129 (H) 10/23/2017 1046     Wt Readings from Last 3 Encounters:  04/08/18 187  lb 3.2 oz (84.9 kg)  03/21/18 185 lb (83.9 kg)  03/18/18 185 lb 12.8 oz (84.3 kg)     Other studies Reviewed: Additional studies/ records that were reviewed today include: Office notes, hospital records and testing.  ASSESSMENT AND PLAN:  1.  Dyspnea on exertion: It is possible that he has coronary artery disease that was not picked up by the stress test.  Continue to follow him clinically for now, consider cardiac CT if his symptoms do not improve  2.  Hypertension: I am suspicious that hypertension is the real cause of his dyspnea on exertion. - He is encouraged to obtain a blood pressure cuff, any cuff he can use, and track his blood pressure, especially when he has symptoms. - I explained that he needed much better blood pressure control, but I did not wish to change a lot of medications at one time, he understands the reasoning behind this - Add amlodipine 5 mg and uptitrate as needed for better blood pressure control. - If this does not get his blood pressure to target, consider adding a beta-blocker or hydralazine -Follow-up with the hypertension clinic in a couple of weeks for additional med titration.   -As both he and his brother have had significant hypertension from a relatively young age, MD advise on testing for  fibromuscular dysplasia/renovascular hypertension.  3.  Chest pain: He had chest pain at night when he went to bed, not with exertion.  However, GI meds did not help.  A recent stress test was negative and an echocardiogram did not show any wall motion abnormalities.  For now, follow him clinically, but he may need to definitive rule out with cardiac CT if his symptoms do not improve.   Current medicines are reviewed at length with the patient today.  The patient does not have concerns regarding medicines.  The following changes have been made: Add amlodipine  Labs/ tests ordered today include:  No orders of the defined types were placed in this encounter.    Disposition:   FU with Dr. Gwenlyn Found  Signed, [name], PA-C  04/08/2018 2:44 PM    [place] Phone: [phone]; Fax: [phone]  This note was written with the assistance of speech recognition software. Please excuse any transcriptional errors.

## 2018-04-10 ENCOUNTER — Other Ambulatory Visit: Payer: Self-pay

## 2018-04-10 ENCOUNTER — Ambulatory Visit (HOSPITAL_COMMUNITY)
Admission: EM | Admit: 2018-04-10 | Discharge: 2018-04-10 | Disposition: A | Discharge status: Home / Self Care | Payer: Self-pay | Attending: Family Medicine | Admitting: Family Medicine

## 2018-04-10 ENCOUNTER — Encounter (HOSPITAL_COMMUNITY): Payer: Self-pay

## 2018-04-10 DIAGNOSIS — H6122 Impacted cerumen, left ear: Secondary | ICD-10-CM

## 2018-04-10 NOTE — ED Triage Notes (Signed)
Ear fulliness for 2 weeks or more. ( left ear )

## 2018-04-10 NOTE — ED Provider Notes (Signed)
[place]    CSN: [id number] Arrival date & time: 04/10/18  0800     History   Chief Complaint Chief Complaint  Patient presents with  . Ear Fullness    HPI Kevin [name] is a 52 y.o. male.   Kevin [name] presents with complaints of left ear fullness and "blocked" which has been worsening over the past past month, especially over the past 2 weeks. Decreased hearing. No pain. If he lays on the ear he can "hear squishing."  No sore throat, no fevers, no injury to the ear. States has required irrigation in the past which has helped with similar symptoms. Hx dm, htn.     ROS per HPI.      Past Medical History:  Diagnosis Date  . Diabetes mellitus without complication ([place])   . History of prediabetes   . Hypertension   . MRSA (methicillin resistant Staphylococcus aureus)     Patient Active Problem List   Diagnosis Date Noted  . Hyperlipidemia 03/18/2018  . Dyspnea on exertion 03/18/2018  . Chest pain 03/18/2018  . Diabetes mellitus without complication ([place]) 16/96/7893  . Essential hypertension 11/11/2017    Past Surgical History:  Procedure Laterality Date  . BACK SURGERY    . LAPAROSCOPIC GASTRIC BANDING         Home Medications    Prior to Admission medications   Medication Sig Start Date End Date Taking? Authorizing Provider  acetaminophen (TYLENOL) 500 MG tablet Take 4,000 mg by mouth. EVERY 3 TO 4 HOURS AS NEEDED FOR TOOTHACHE PAIN    [provider]  amLODipine (NORVASC) 5 MG tablet Take 1 tablet (76m) by mouth daily. Take extra 1/2 tablet (2.5 mg) for systolic greater than 18107/30/19   Barrett, Rhonda G, PA-C  atorvastatin (LIPITOR) 40 MG tablet Take 1 tablet (40 mg total) by mouth daily. 03/18/18   BLorretta Harp MD  Blood Glucose Monitoring Suppl (TRUE METRIX METER) w/Device KIT Use as instructed 10/23/17   FGildardo Pounds NP  enalapril (VASOTEC) 20 MG tablet Take 2 tablets (40 mg total) by mouth daily. 02/28/18 05/29/18  FGildardo Pounds NP  glimepiride (AMARYL) 2 MG tablet Take 1 tablet (2 mg total) by mouth daily before breakfast. 02/28/18   FGildardo Pounds NP  glucose blood (TRUE METRIX BLOOD GLUCOSE TEST) test strip Use as instructed 10/23/17   FGildardo Pounds NP  omeprazole (PRILOSEC) 20 MG capsule Take 1 capsule (20 mg total) by mouth 2 (two) times daily before a meal. 02/28/18 05/29/18  FGildardo Pounds NP  pneumococcal 23 valent vaccine (PNEUMOVAX 23) 25 MCG/0.5ML injection To be administered by the Pharmacist 11/26/17   JTresa Garter MD  sitaGLIPtin-metformin (JANUMET) 50-1000 MG tablet Take 1 tablet by mouth 2 (two) times daily with a meal. 02/28/18   FGildardo Pounds NP  TRUEPLUS LANCETS 28G MISC Use as instructed 10/23/17   FGildardo Pounds NP    Family History Family History  Problem Relation Age of Onset  . Heart disease Mother   . Hypertension Mother   . Diabetes Sister   . Hypertension Sister   . Stroke Brother   . Diabetes Brother   . Heart disease Brother   . Heart attack Brother   . Hypertension Brother   . Hypertension Father     Social History Social History   Tobacco Use  . Smoking status: Former Smoker    Last attempt to quit: 2000    Years since  quitting: 19.5  . Smokeless tobacco: Never Used  Substance Use Topics  . Alcohol use: No  . Drug use: No     Allergies   Patient has no known allergies.   Review of Systems Review of Systems   Physical Exam Triage Vital Signs ED Triage Vitals  Enc Vitals Group     BP 04/10/18 0820 (!) 145/98     Pulse Rate 04/10/18 0820 87     Resp 04/10/18 0820 16     Temp 04/10/18 0820 98.4 F (36.9 C)     Temp Source 04/10/18 0820 Oral     SpO2 04/10/18 0820 99 %     Weight 04/10/18 0822 187 lb (84.8 kg)     Height --      Head Circumference --      Peak Flow --      Pain Score 04/10/18 0821 0     Pain Loc --      Pain Edu? --      Excl. in [place]? --    No data found.  Updated Vital Signs BP (!) 145/98   Pulse 87    Temp 98.4 F (36.9 C) (Oral)   Resp 16   Wt 187 lb (84.8 kg)   SpO2 99%   BMI 29.29 kg/m    Physical Exam  Constitutional: He is oriented to person, place, and time. He appears well-developed and well-nourished.  HENT:  Head: Normocephalic and atraumatic.  Right Ear: Tympanic membrane, external ear and ear canal normal.  Left Ear: Tympanic membrane, external ear and ear canal normal.  Mouth/Throat: Oropharynx is clear and moist.  Cerumen occluding left canal on initial evaluation; completely clear canal and TM s/p irrigation  Eyes: Pupils are equal, round, and reactive to light. EOM are normal.  Cardiovascular: Normal rate and regular rhythm.  Pulmonary/Chest: Effort normal and breath sounds normal.  Neurological: He is alert and oriented to person, place, and time.  Skin: Skin is warm and dry.     UC Treatments / Results  Labs (all labs ordered are listed, but only abnormal results are displayed) Labs Reviewed - No data to display  EKG None  Radiology No results found.  Procedures Procedures (including critical care time)  Medications Ordered in UC Medications - No data to display  Initial Impression / Assessment and Plan / UC Course  I have reviewed the triage vital signs and the nursing notes.  Pertinent labs & imaging results that were available during my care of the patient were reviewed by me and considered in my medical decision making (see chart for details).     Cerumen impaction, resolved with irrigation completed by nursing staff in clinic today. No further treatment indicated. Patient verbalized understanding and agreeable to plan.    Final Clinical Impressions(s) / UC Diagnoses   Final diagnoses:  Impacted cerumen of left ear     Discharge Instructions     May use Debrox as needed for ear wax build up.  If symptoms worsen or do not improve in the next week to return to be seen or to follow up with your PCP.      ED Prescriptions    None       Controlled Substance Prescriptions [place] Controlled Substance Registry consulted? Not Applicable   Zigmund Gottron, NP 04/10/18 [phone]

## 2018-04-10 NOTE — Discharge Instructions (Signed)
May use Debrox as needed for ear wax build up.  If symptoms worsen or do not improve in the next week to return to be seen or to follow up with your PCP.

## 2018-04-15 MED FILL — ?ATORVASTATIN 40MG TABLET: 40 | 30 days supply | Qty: 30 | Fill #1

## 2018-04-15 MED FILL — GLIMEPIRIDE 2 MG TABS: 2 | 30 days supply | Qty: 30 | Fill #5

## 2018-04-21 ENCOUNTER — Ambulatory Visit: Payer: Self-pay | Admitting: Cardiology

## 2018-04-23 ENCOUNTER — Ambulatory Visit: Payer: Self-pay | Attending: Family Medicine

## 2018-04-24 ENCOUNTER — Ambulatory Visit: Payer: Self-pay

## 2018-04-28 MED FILL — ?OMEPRazole 20mg CPDR: 20 | 30 days supply | Qty: 60 | Fill #2

## 2018-04-28 MED FILL — ENALAPRIL MALEATE 20 MG TAB: 20 | 30 days supply | Qty: 60 | Fill #2

## 2018-04-28 MED FILL — !JANUMET 50-1000MG TABLET: 50-1000 | 30 days supply | Qty: 60 | Fill #2

## 2018-04-29 ENCOUNTER — Ambulatory Visit: Payer: Self-pay | Admitting: Cardiovascular Disease

## 2018-05-01 ENCOUNTER — Ambulatory Visit (HOSPITAL_COMMUNITY)
Admission: EM | Admit: 2018-05-01 | Discharge: 2018-05-01 | Disposition: A | Discharge status: Home / Self Care | Payer: Self-pay | Attending: Emergency Medicine | Admitting: Emergency Medicine

## 2018-05-01 ENCOUNTER — Other Ambulatory Visit: Payer: Self-pay

## 2018-05-01 DIAGNOSIS — L0231 Cutaneous abscess of buttock: Secondary | ICD-10-CM

## 2018-05-01 DIAGNOSIS — R030 Elevated blood-pressure reading, without diagnosis of hypertension: Secondary | ICD-10-CM

## 2018-05-01 MED ORDER — DOXYCYCLINE HYCLATE 100 MG PO CAPS: 100.0000 mg | ORAL_CAPSULE | Freq: Two times a day (BID) | ORAL | 0 refills | Status: DC

## 2018-05-01 MED FILL — DOXYCYCLINE HYCLATE 100 MG: 100 | 10 days supply | Qty: 20 | Fill #0

## 2018-05-01 NOTE — ED Provider Notes (Signed)
[place]   [id number] 05/01/18 Arrival Time: 0758   [id number]  SUBJECTIVE:  Kevin [name] is a 52 y.o. male who presents with a possible abscess of his right buttock. Onset abrupt, approximately 1 day ago. Painful to the touch.  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  Would like to avoid incision and drainage procedure due to inability to take OTC NSAIDs for pain.  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  Complains of mild swelling and erythema.  Denies fever, chills, nausea, vomiting, or active drainage.   ROS: As per HPI.  Past Medical History:  Diagnosis Date  . Diabetes mellitus without complication ([place])   . History of prediabetes   . Hypertension   . MRSA (methicillin resistant Staphylococcus aureus)    Past Surgical History:  Procedure Laterality Date  . BACK SURGERY    . LAPAROSCOPIC GASTRIC BANDING     No Known Allergies No current facility-administered medications on file prior to encounter.    Current Outpatient Medications on File Prior to Encounter  Medication Sig Dispense Refill  . acetaminophen (TYLENOL) 500 MG tablet Take 4,000 mg by mouth. EVERY 3 TO 4 HOURS AS NEEDED FOR TOOTHACHE PAIN    . amLODipine (NORVASC) 5 MG tablet Take 1 tablet (48m) by mouth daily. Take extra 1/2 tablet (2.5 mg) for systolic greater than 1546100 tablet 3  . atorvastatin (LIPITOR) 40 MG tablet Take 1 tablet (40 mg total) by mouth daily. 90 tablet 3  . Blood Glucose Monitoring Suppl (TRUE METRIX METER) w/Device KIT Use as instructed 1 kit 0  . enalapril (VASOTEC) 20 MG tablet Take 2 tablets (40 mg total) by mouth daily. 180 tablet 1  . glimepiride (AMARYL) 2 MG tablet Take 1 tablet (2 mg total) by mouth daily before breakfast. 90 tablet 3  . glucose blood (TRUE METRIX BLOOD GLUCOSE TEST) test strip Use as instructed 100 each 12  . omeprazole (PRILOSEC) 20 MG capsule Take 1 capsule (20 mg total) by mouth 2 (two) times daily before a  meal. 180 capsule 3  . pneumococcal 23 valent vaccine (PNEUMOVAX 23) 25 MCG/0.5ML injection To be administered by the Pharmacist 0.5 mL 0  . sitaGLIPtin-metformin (JANUMET) 50-1000 MG tablet Take 1 tablet by mouth 2 (two) times daily with a meal. 60 tablet 3  . TRUEPLUS LANCETS 28G MISC Use as instructed 100 each 3   Social History   Socioeconomic History  . Marital status: Divorced    Spouse name: Not on file  . Number of children: Not on file  . Years of education: Not on file  . Highest education level: Not on file  Occupational History  . Not on file  Social Needs  . Financial resource strain: Not on file  . Food insecurity:    Worry: Not on file    Inability: Not on file  . Transportation needs:    Medical: Not on file    Non-medical: Not on file  Tobacco Use  . Smoking status: Former Smoker    Last attempt to quit: 2000    Years since quitting: 19.6  . Smokeless tobacco: Never Used  Substance and Sexual Activity  . Alcohol use: No  . Drug use: No  . Sexual activity: Yes  Lifestyle  . Physical activity:    Days per week: Not on file    Minutes per session: Not on file  . Stress: Not on file  Relationships  .  Social connections:    Talks on phone: Not on file    Gets together: Not on file    Attends religious service: Not on file    Active member of club or organization: Not on file    Attends meetings of clubs or organizations: Not on file    Relationship status: Not on file  . Intimate partner violence:    Fear of current or ex partner: Not on file    Emotionally abused: Not on file    Physically abused: Not on file    Forced sexual activity: Not on file  Other Topics Concern  . Not on file  Social History Narrative  . Not on file   Family History  Problem Relation Age of Onset  . Heart disease Mother   . Hypertension Mother   . Diabetes Sister   . Hypertension Sister   . Stroke Brother   . Diabetes Brother   . Heart disease Brother   . Heart  attack Brother   . Hypertension Brother   . Hypertension Father     OBJECTIVE:  Vitals:   05/01/18 3833 05/01/18 0827 05/01/18 0829 05/01/18 0833  BP:  125/82 131/82 122/80  Pulse:      Resp:      Temp:      TempSrc:      SpO2:      Weight: 180 lb (81.6 kg)        General appearance: alert; no distress CV: RRR without murmur, gallops, or rubs Lungs: CTAB Skin: 3 x 3 cm are of  induration of his right buttocks; tender to touch; no active drainage Psychological: alert and cooperative; anxious mood and affect   ASSESSMENT & PLAN:  1. Abscess of buttock, right   2. Elevated blood pressure reading     Meds ordered this encounter  Medications  . doxycycline (VIBRAMYCIN) 100 MG capsule    Sig: Take 1 capsule (100 mg total) by mouth 2 (two) times daily.    Dispense:  20 capsule    Refill:  0    Order Specific Question:   Supervising Provider    Answer:   Wynona Luna [[id number]]   Apply warm compresses 3-4x daily for 10-15 minutes Wash site daily with warm water and mild soap Keep covered to avoid friction Take antibiotic as prescribed and to completion Follow up here or with PCP if symptoms persists Return or go to the ED if you have any new or worsening symptoms   Reviewed expectations re: course of current medical issues. Questions answered. Outlined signs and symptoms indicating need for more acute intervention. Patient verbalized understanding. After Visit Summary given.          Stacey Drain Swink, PA-C 05/01/18 [phone]

## 2018-05-01 NOTE — ED Notes (Signed)
Notified brittany, pa of vital sign readings

## 2018-05-01 NOTE — ED Triage Notes (Signed)
Abscess on his lower back x 1 day.

## 2018-05-14 MED FILL — ?ATORVASTATIN 40MG TABLET: 40 | 30 days supply | Qty: 30 | Fill #2

## 2018-05-14 MED FILL — ?AMLODIPINE BESYLATE 5 MG T: 5 MG | 30 days supply | Qty: 45 | Fill #1

## 2018-05-14 MED FILL — GLIMEPIRIDE 2 MG TABS: 2 | 30 days supply | Qty: 30 | Fill #6

## 2018-05-26 MED FILL — ?OMEPRazole 20mg CPDR: 20 | 30 days supply | Qty: 60 | Fill #3

## 2018-05-26 MED FILL — ENALAPRIL MALEATE 20 MG TAB: 20 | 30 days supply | Qty: 60 | Fill #3

## 2018-05-26 MED FILL — !JANUMET 50-1000MG TABLET: 50-1000 | 30 days supply | Qty: 60 | Fill #3

## 2018-05-27 ENCOUNTER — Ambulatory Visit: Payer: Self-pay

## 2018-06-02 ENCOUNTER — Ambulatory Visit: Payer: Self-pay | Attending: Nurse Practitioner | Admitting: Nurse Practitioner

## 2018-06-02 ENCOUNTER — Encounter: Payer: Self-pay | Admitting: Nurse Practitioner

## 2018-06-02 VITALS — BP 132/90 | HR 87 | Temp 98.4°F | Ht 67.0 in | Wt 187.2 lb

## 2018-06-02 DIAGNOSIS — Z1211 Encounter for screening for malignant neoplasm of colon: Secondary | ICD-10-CM

## 2018-06-02 DIAGNOSIS — Z7984 Long term (current) use of oral hypoglycemic drugs: Secondary | ICD-10-CM | POA: Insufficient documentation

## 2018-06-02 DIAGNOSIS — Z79899 Other long term (current) drug therapy: Secondary | ICD-10-CM | POA: Insufficient documentation

## 2018-06-02 DIAGNOSIS — I10 Essential (primary) hypertension: Secondary | ICD-10-CM | POA: Insufficient documentation

## 2018-06-02 DIAGNOSIS — Z8249 Family history of ischemic heart disease and other diseases of the circulatory system: Secondary | ICD-10-CM | POA: Insufficient documentation

## 2018-06-02 DIAGNOSIS — E119 Type 2 diabetes mellitus without complications: Secondary | ICD-10-CM | POA: Insufficient documentation

## 2018-06-02 LAB — POCT GLYCOSYLATED HEMOGLOBIN (HGB A1C): Hemoglobin A1C: 5.8 % — AB (ref 4.0–5.6)

## 2018-06-02 LAB — GLUCOSE, POCT (MANUAL RESULT ENTRY): POC Glucose: 111 mg/dl — AB (ref 70–99)

## 2018-06-02 NOTE — Progress Notes (Signed)
Assessment & Plan:  Kevin [name] was seen today for follow-up.  Diagnoses and all orders for this visit:  Diabetes mellitus without complication (HCC) -     Glucose (CBG) -     HgB A1c Continue blood sugar control as discussed in office today, low carbohydrate diet, and regular physical exercise as tolerated, 150 minutes per week (30 min each day, 5 days per week, or 50 min 3 days per week). Keep blood sugar logs with fasting goal of 90-130 mg/dl, post prandial (after you eat) less than 180.  For Hypoglycemia: BS <60 and Hyperglycemia BS >400; contact the clinic ASAP. Annual eye exams and foot exams are recommended.  Colon cancer screening -     Ambulatory referral to Gastroenterology    Patient has been counseled on age-appropriate routine health concerns for screening and prevention. These are reviewed and up-to-date. Referrals have been placed accordingly. Immunizations are up-to-date or declined.    Subjective:   Chief Complaint  Patient presents with  . Follow-up    Pt. is here for follow-up.    Kevin [name] 52 y.o. male presents to office today for follow up to DM. He is seeing cardiology for HTN, CP, and DOE.    Diabetes Mellitus Type 2 Chronic and well improved. Monitoring blood glucose levels twice a day. Average readings 90-130s fasting and postprandial. Denies any hypo or hyperglycemic symptoms. Overdue for eye exam. Will place referral today.  Lab Results  Component Value Date   HGBA1C 5.8 (A) 06/02/2018   Lab Results  Component Value Date   HGBA1C 6.5 (A) 02/28/2018    Review of Systems  Constitutional: Negative for fever, malaise/fatigue and weight loss.  HENT: Negative.  Negative for nosebleeds.   Eyes: Negative.  Negative for blurred vision, double vision and photophobia.  Respiratory: Negative.  Negative for cough and shortness of breath.   Cardiovascular: Positive for chest pain (mild and intermittent). Negative for palpitations and leg swelling.    Gastrointestinal: Negative.  Negative for heartburn, nausea and vomiting.  Musculoskeletal: Negative.  Negative for myalgias.  Neurological: Negative.  Negative for dizziness, focal weakness, seizures and headaches.  Psychiatric/Behavioral: Negative.  Negative for suicidal ideas.    Past Medical History:  Diagnosis Date  . Diabetes mellitus without complication ([place])   . History of prediabetes   . Hypertension   . MRSA (methicillin resistant Staphylococcus aureus)     Past Surgical History:  Procedure Laterality Date  . BACK SURGERY    . LAPAROSCOPIC GASTRIC BANDING      Family History  Problem Relation Age of Onset  . Heart disease Mother   . Hypertension Mother   . Diabetes Sister   . Hypertension Sister   . Stroke Brother   . Diabetes Brother   . Heart disease Brother   . Heart attack Brother   . Hypertension Brother   . Hypertension Father     Social History Reviewed with no changes to be made today.   Outpatient Medications Prior to Visit  Medication Sig Dispense Refill  . acetaminophen (TYLENOL) 500 MG tablet Take 4,000 mg by mouth. EVERY 3 TO 4 HOURS AS NEEDED FOR TOOTHACHE PAIN    . amLODipine (NORVASC) 5 MG tablet Take 1 tablet (57m) by mouth daily. Take extra 1/2 tablet (2.5 mg) for systolic greater than 1700100 tablet 3  . atorvastatin (LIPITOR) 40 MG tablet Take 1 tablet (40 mg total) by mouth daily. 90 tablet 3  . Blood Glucose Monitoring Suppl (TRUE  METRIX METER) w/Device KIT Use as instructed 1 kit 0  . glimepiride (AMARYL) 2 MG tablet Take 1 tablet (2 mg total) by mouth daily before breakfast. 90 tablet 3  . glucose blood (TRUE METRIX BLOOD GLUCOSE TEST) test strip Use as instructed 100 each 12  . pneumococcal 23 valent vaccine (PNEUMOVAX 23) 25 MCG/0.5ML injection To be administered by the Pharmacist 0.5 mL 0  . sitaGLIPtin-metformin (JANUMET) 50-1000 MG tablet Take 1 tablet by mouth 2 (two) times daily with a meal. 60 tablet 3  . TRUEPLUS LANCETS 28G  MISC Use as instructed 100 each 3  . doxycycline (VIBRAMYCIN) 100 MG capsule Take 1 capsule (100 mg total) by mouth 2 (two) times daily. 20 capsule 0  . enalapril (VASOTEC) 20 MG tablet Take 2 tablets (40 mg total) by mouth daily. 180 tablet 1  . omeprazole (PRILOSEC) 20 MG capsule Take 1 capsule (20 mg total) by mouth 2 (two) times daily before a meal. 180 capsule 3   No facility-administered medications prior to visit.     No Known Allergies     Objective:    BP 132/90 (BP Location: Left Arm, Patient Position: Sitting, Cuff Size: Normal)   Pulse 87   Temp 98.4 F (36.9 C) (Oral)   Ht 5' 7"  (1.702 m)   Wt 187 lb 3.2 oz (84.9 kg)   SpO2 96%   BMI 29.32 kg/m  Wt Readings from Last 3 Encounters:  06/02/18 187 lb 3.2 oz (84.9 kg)  05/01/18 180 lb (81.6 kg)  04/10/18 187 lb (84.8 kg)    Physical Exam  Constitutional: He is oriented to person, place, and time. He appears well-developed and well-nourished. He is cooperative.  HENT:  Head: Normocephalic and atraumatic.  Eyes: EOM are normal.  Neck: Normal range of motion.  Cardiovascular: Normal rate, regular rhythm and normal heart sounds. Exam reveals no gallop and no friction rub.  No murmur heard. Pulmonary/Chest: Effort normal and breath sounds normal. No tachypnea. No respiratory distress. He has no decreased breath sounds. He has no wheezes. He has no rhonchi. He has no rales. He exhibits no tenderness.  Abdominal: Bowel sounds are normal.  Musculoskeletal: Normal range of motion. He exhibits no edema.  Neurological: He is alert and oriented to person, place, and time. Coordination normal.  Skin: Skin is warm and dry.  Psychiatric: He has a normal mood and affect. His behavior is normal. Judgment and thought content normal.  Nursing note and vitals reviewed.        Patient has been counseled extensively about nutrition and exercise as well as the importance of adherence with medications and regular follow-up. The  patient was given clear instructions to go to ER or return to medical center if symptoms don't improve, worsen or new problems develop. The patient verbalized understanding.   Follow-up: Return in about 6 months (around 12/01/2018) for DM.   [name], FNP-BC [facility] [place], [place]   06/02/2018, 9:12 AM

## 2018-06-09 ENCOUNTER — Encounter: Payer: Self-pay | Admitting: Gastroenterology

## 2018-06-10 MED FILL — ?AMLODIPINE BESYLATE 5 MG T: 5 MG | 30 days supply | Qty: 45 | Fill #2

## 2018-06-10 MED FILL — GLIMEPIRIDE 2 MG TABS: 2 | 30 days supply | Qty: 30 | Fill #7

## 2018-06-10 MED FILL — ?ATORVASTATIN 40MG TABLET: 40 | 30 days supply | Qty: 30 | Fill #3

## 2018-06-20 ENCOUNTER — Other Ambulatory Visit: Payer: Self-pay | Admitting: Nurse Practitioner

## 2018-06-20 DIAGNOSIS — E119 Type 2 diabetes mellitus without complications: Secondary | ICD-10-CM

## 2018-06-20 MED FILL — ENALAPRIL MALEATE 20 MG TAB: 20 | 30 days supply | Qty: 60 | Fill #4

## 2018-06-20 MED FILL — ?OMEPRazole 20mg CPDR: 20 | 30 days supply | Qty: 60 | Fill #4

## 2018-06-24 MED FILL — !JANUMET 50-1000MG TABLET: 50-1000 | 30 days supply | Qty: 60 | Fill #0

## 2018-07-01 ENCOUNTER — Ambulatory Visit: Payer: Self-pay

## 2018-07-08 MED FILL — ?ATORVASTATIN 40MG TABLET: 40 | 30 days supply | Qty: 30 | Fill #4

## 2018-07-08 MED FILL — GLIMEPIRIDE 2 MG TABS: 2 | 30 days supply | Qty: 30 | Fill #8

## 2018-07-08 MED FILL — ?AMLODIPINE BESYLATE 5 MG T: 5 MG | 30 days supply | Qty: 45 | Fill #3

## 2018-07-21 MED FILL — ENALAPRIL MALEATE 20 MG TAB: 20 | 30 days supply | Qty: 60 | Fill #5

## 2018-07-21 MED FILL — OMEPRAZOLE 20 MG CAP: 20 | 30 days supply | Qty: 60 | Fill #5

## 2018-07-22 ENCOUNTER — Other Ambulatory Visit: Payer: Self-pay

## 2018-07-22 DIAGNOSIS — E119 Type 2 diabetes mellitus without complications: Secondary | ICD-10-CM

## 2018-07-22 MED ORDER — SITAGLIPTIN PHOS-METFORMIN HCL 50-1000 MG PO TABS: ORAL_TABLET | ORAL | 0 refills | Status: DC

## 2018-07-23 MED FILL — $JANUMET 50-1000 MG TABLET: 50-1000 | 90 days supply | Qty: 180 | Fill #0

## 2018-08-05 MED FILL — ?AMLODIPINE BESYLATE 5 MG T: 5 MG | 30 days supply | Qty: 45 | Fill #4

## 2018-08-11 MED FILL — GLIMEPIRIDE 2 MG TABS: 2 | 30 days supply | Qty: 30 | Fill #9

## 2018-08-11 MED FILL — ?ATORVASTATIN 40MG TABLET: 40 | 30 days supply | Qty: 30 | Fill #5

## 2018-08-12 ENCOUNTER — Encounter (HOSPITAL_COMMUNITY): Payer: Self-pay | Admitting: Emergency Medicine

## 2018-08-12 ENCOUNTER — Ambulatory Visit (HOSPITAL_COMMUNITY)
Admission: EM | Admit: 2018-08-12 | Discharge: 2018-08-12 | Disposition: A | Discharge status: Home / Self Care | Payer: Self-pay | Attending: Family Medicine | Admitting: Family Medicine

## 2018-08-12 DIAGNOSIS — S90412A Abrasion, left great toe, initial encounter: Secondary | ICD-10-CM

## 2018-08-12 DIAGNOSIS — Z8614 Personal history of Methicillin resistant Staphylococcus aureus infection: Secondary | ICD-10-CM

## 2018-08-12 MED ORDER — DOXYCYCLINE HYCLATE 100 MG PO CAPS: 100.0000 mg | ORAL_CAPSULE | Freq: Two times a day (BID) | ORAL | 0 refills | Status: DC

## 2018-08-12 NOTE — ED Provider Notes (Signed)
[facility]   [id number] 08/12/18 Arrival Time: 1100  ASSESSMENT & PLAN:  1. Abrasion of left great toe, initial encounter   2. History of MRSA infection    Will start: Meds ordered this encounter  Medications  . doxycycline (VIBRAMYCIN) 100 MG capsule    Sig: Take 1 capsule (100 mg total) by mouth 2 (two) times daily.    Dispense:  20 capsule    Refill:  0  Observe.  Follow-up Information    Claiborne RiggFleming, Zelda W, NP.   Specialty:  Nurse Practitioner Why:  As needed or if worsening. Contact information: [address] [place] [address] [phone]          May also f/u here if needed.  Reviewed expectations re: course of current medical issues. Questions answered. Outlined signs and symptoms indicating need for more acute intervention. Patient verbalized understanding. After Visit Summary given.  SUBJECTIVE: History from: patient. Kevin LeanDarren Kopf is a 52 y.o. male who reports h/o multiple skin MRSA infections over the past few years. Noticed tip of L great toe sore for a few days. Now with a "small red area" noticed yesterday. L great toe is tender around this area. Afebrile. No drainage from area. Unsure if he scraped toe. "I just feel like this is the beginning of a MRSA infection." Desires initiation of doxycycline. Ambulatory without difficulty. No home treatment. No specific aggravating or alleviating factors reported.  Past Surgical History:  Procedure Laterality Date  . BACK SURGERY    . LAPAROSCOPIC GASTRIC BANDING       ROS: As per HPI.   OBJECTIVE:  Vitals:   08/12/18 1135  BP: (!) 166/113  Pulse: (!) 106  Resp: 16  Temp: 98.7 F (37.1 C)  SpO2: 98%    General appearance: alert; no distress Extremities: . LLE: warm and well perfused; well localized moderate tenderness over the tip of his L great toe; approx 0.5cm area of erythema and mild abrasion present; tender to touch; nail appears normal; no areas of fluctuance appreciated;  normal L great toe ROM; no tenderness to 1st MTP joint CV: brisk extremity capillary refill of LLE Skin: warm and dry; no visible rashes Neurologic: gait normal Psychological: alert and cooperative; normal mood and affect  No Known Allergies  Past Medical History:  Diagnosis Date  . Diabetes mellitus without complication (H[facility])   . History of prediabetes   . Hypertension   . MRSA (methicillin resistant Staphylococcus aureus)    Social History   Socioeconomic History  . Marital status: Divorced    Spouse name: Not on file  . Number of children: Not on file  . Years of education: Not on file  . Highest education level: Not on file  Occupational History  . Not on file  Social Needs  . Financial resource strain: Not on file  . Food insecurity:    Worry: Not on file    Inability: Not on file  . Transportation needs:    Medical: Not on file    Non-medical: Not on file  Tobacco Use  . Smoking status: Former Smoker    Last attempt to quit: 2000    Years since quitting: 19.9  . Smokeless tobacco: Never Used  Substance and Sexual Activity  . Alcohol use: No  . Drug use: No  . Sexual activity: Yes  Lifestyle  . Physical activity:    Days per week: Not on file    Minutes per session: Not on file  .  Stress: Not on file  Relationships  . Social connections:    Talks on phone: Not on file    Gets together: Not on file    Attends religious service: Not on file    Active member of club or organization: Not on file    Attends meetings of clubs or organizations: Not on file    Relationship status: Not on file  Other Topics Concern  . Not on file  Social History Narrative  . Not on file   Family History  Problem Relation Age of Onset  . Heart disease Mother   . Hypertension Mother   . Diabetes Sister   . Hypertension Sister   . Stroke Brother   . Diabetes Brother   . Heart disease Brother   . Heart attack Brother   . Hypertension Brother   . Hypertension Father     Past Surgical History:  Procedure Laterality Date  . BACK SURGERY    . LAPAROSCOPIC GASTRIC Domingo Pulse, MD 08/12/18 [phone]

## 2018-08-12 NOTE — ED Triage Notes (Signed)
Pt states he has mrsa, and a week ago his L big toe started hurting, states its a mrsa flare up.

## 2018-08-18 ENCOUNTER — Other Ambulatory Visit: Payer: Self-pay | Admitting: Nurse Practitioner

## 2018-08-18 DIAGNOSIS — I10 Essential (primary) hypertension: Secondary | ICD-10-CM

## 2018-08-18 MED FILL — ENALAPRIL MALEATE 20 MG TAB: 20 | 30 days supply | Qty: 60 | Fill #0

## 2018-08-18 MED FILL — OMEPRAZOLE 20 MG CAP: 20 | 30 days supply | Qty: 60 | Fill #6

## 2018-08-27 ENCOUNTER — Ambulatory Visit: Payer: Self-pay | Admitting: Cardiovascular Disease

## 2018-08-28 ENCOUNTER — Encounter (HOSPITAL_COMMUNITY): Payer: Self-pay | Admitting: Emergency Medicine

## 2018-08-28 ENCOUNTER — Other Ambulatory Visit: Payer: Self-pay

## 2018-08-28 ENCOUNTER — Telehealth (HOSPITAL_COMMUNITY): Payer: Self-pay | Admitting: Emergency Medicine

## 2018-08-28 ENCOUNTER — Ambulatory Visit (HOSPITAL_COMMUNITY)
Admission: EM | Admit: 2018-08-28 | Discharge: 2018-08-28 | Disposition: A | Discharge status: Home / Self Care | Payer: Self-pay | Attending: Family Medicine | Admitting: Family Medicine

## 2018-08-28 DIAGNOSIS — I10 Essential (primary) hypertension: Secondary | ICD-10-CM

## 2018-08-28 DIAGNOSIS — Z8614 Personal history of Methicillin resistant Staphylococcus aureus infection: Secondary | ICD-10-CM | POA: Insufficient documentation

## 2018-08-28 DIAGNOSIS — R221 Localized swelling, mass and lump, neck: Secondary | ICD-10-CM

## 2018-08-28 DIAGNOSIS — E119 Type 2 diabetes mellitus without complications: Secondary | ICD-10-CM

## 2018-08-28 DIAGNOSIS — L0889 Other specified local infections of the skin and subcutaneous tissue: Secondary | ICD-10-CM

## 2018-08-28 MED ORDER — DOXYCYCLINE HYCLATE 100 MG PO CAPS: 100.0000 mg | ORAL_CAPSULE | Freq: Two times a day (BID) | ORAL | 2 refills | Status: DC

## 2018-08-28 MED FILL — DOXYCYCLINE HYCLATE 100 MG: 100 | 7 days supply | Qty: 14 | Fill #0

## 2018-08-28 NOTE — Discharge Instructions (Signed)
I have prescribed doxycycline 1 pill twice a day for 1 week Take with food There are 2 refills for you to use when needed Talk to your primary care doctor about ongoing refills

## 2018-08-28 NOTE — ED Triage Notes (Signed)
Seen by provider

## 2018-08-28 NOTE — ED Provider Notes (Signed)
[facility]    CSN: [id number] Arrival date & time: 08/28/18  7353     History   Chief Complaint Chief Complaint  Patient presents with  . Abscess    HPI Kevin [name] is a 52 y.o. male.   HPI  History of recurrent MRSA Well controlled diabetic with a recent A1c of 5.8 Has a record of 38 medical visits for MRSA Has been hospitalized Has gone through decontamination procedures more than once Currently with an infection at hairline at back of neck that is painful and growing  Past Medical History:  Diagnosis Date  . Diabetes mellitus without complication ([place])   . History of prediabetes   . Hypertension   . MRSA (methicillin resistant Staphylococcus aureus)     Patient Active Problem List   Diagnosis Date Noted  . Hyperlipidemia 03/18/2018  . Dyspnea on exertion 03/18/2018  . Chest pain 03/18/2018  . Diabetes mellitus without complication ([place]) 29/92/4268  . Essential hypertension 11/11/2017    Past Surgical History:  Procedure Laterality Date  . BACK SURGERY    . LAPAROSCOPIC GASTRIC BANDING         Home Medications    Prior to Admission medications   Medication Sig Start Date End Date Taking? Authorizing Provider  acetaminophen (TYLENOL) 500 MG tablet Take 4,000 mg by mouth. EVERY 3 TO 4 HOURS AS NEEDED FOR TOOTHACHE PAIN    [provider]  amLODipine (NORVASC) 5 MG tablet Take 1 tablet (61m) by mouth daily. Take extra 1/2 tablet (2.5 mg) for systolic greater than 13417/30/19   Barrett, Rhonda G, PA-C  atorvastatin (LIPITOR) 40 MG tablet Take 1 tablet (40 mg total) by mouth daily. 03/18/18   BLorretta Harp MD  Blood Glucose Monitoring Suppl (TRUE METRIX METER) w/Device KIT Use as instructed 10/23/17   FGildardo Pounds NP  doxycycline (VIBRAMYCIN) 100 MG capsule Take 1 capsule (100 mg total) by mouth 2 (two) times daily. 08/28/18   NRaylene Everts MD  enalapril (VASOTEC) 20 MG tablet TAKE 2 TABLETS BY MOUTH DAILY. 08/18/18    FGildardo Pounds NP  glimepiride (AMARYL) 2 MG tablet Take 1 tablet (2 mg total) by mouth daily before breakfast. 02/28/18   FGildardo Pounds NP  glucose blood (TRUE METRIX BLOOD GLUCOSE TEST) test strip Use as instructed 10/23/17   FGildardo Pounds NP  omeprazole (PRILOSEC) 20 MG capsule Take 1 capsule (20 mg total) by mouth 2 (two) times daily before a meal. 02/28/18 05/29/18  FGildardo Pounds NP  pneumococcal 23 valent vaccine (PNEUMOVAX 23) 25 MCG/0.5ML injection To be administered by the Pharmacist 11/26/17   JTresa Garter MD  sitaGLIPtin-metformin (JANUMET) 50-1000 MG tablet TAKE 1 TABLET BY MOUTH 2 (TWO) TIMES DAILY WITH A MEAL. 07/22/18   FGildardo Pounds NP  TRUEPLUS LANCETS 28G MISC Use as instructed 10/23/17   FGildardo Pounds NP    Family History Family History  Problem Relation Age of Onset  . Heart disease Mother   . Hypertension Mother   . Diabetes Sister   . Hypertension Sister   . Stroke Brother   . Diabetes Brother   . Heart disease Brother   . Heart attack Brother   . Hypertension Brother   . Hypertension Father     Social History Social History   Tobacco Use  . Smoking status: Former Smoker    Last attempt to quit: 2000    Years since quitting: 19.9  . Smokeless tobacco:  Never Used  Substance Use Topics  . Alcohol use: No  . Drug use: No     Allergies   Patient has no known allergies.   Review of Systems Review of Systems  Constitutional: Negative for chills and fever.  HENT: Negative for ear pain and sore throat.   Eyes: Negative for pain and visual disturbance.  Respiratory: Negative for cough and shortness of breath.   Cardiovascular: Negative for chest pain and palpitations.  Gastrointestinal: Negative for abdominal pain and vomiting.  Genitourinary: Negative for dysuria and hematuria.  Musculoskeletal: Negative for arthralgias and back pain.  Skin: Positive for rash. Negative for color change.  Neurological: Negative for seizures  and syncope.  All other systems reviewed and are negative.    Physical Exam Triage Vital Signs ED Triage Vitals  Enc Vitals Group     BP 08/28/18 0922 (!) 154/96     Pulse Rate 08/28/18 0922 98     Resp 08/28/18 0922 16     Temp 08/28/18 0922 97.8 F (36.6 C)     Temp Source 08/28/18 0922 Oral     SpO2 08/28/18 0922 100 %     Weight --      Height --      Head Circumference --      Peak Flow --      Pain Score 08/28/18 0955 6     Pain Loc --      Pain Edu? --      Excl. in [place]? --    No data found.  Updated Vital Signs BP (!) 154/96 (BP Location: Left Arm)   Pulse 98   Temp 97.8 F (36.6 C) (Oral)   Resp 16   SpO2 100%   Visual Acuity Right Eye Distance:   Left Eye Distance:   Bilateral Distance:    Right Eye Near:   Left Eye Near:    Bilateral Near:     Physical Exam Constitutional:      General: He is not in acute distress.    Appearance: He is well-developed.  HENT:     Head: Normocephalic and atraumatic.  Eyes:     Conjunctiva/sclera: Conjunctivae normal.     Pupils: Pupils are equal, round, and reactive to light.  Neck:     Musculoskeletal: Normal range of motion.  Cardiovascular:     Rate and Rhythm: Normal rate.  Pulmonary:     Effort: Pulmonary effort is normal. No respiratory distress.  Abdominal:     General: There is no distension.     Palpations: Abdomen is soft.  Musculoskeletal: Normal range of motion.  Skin:    General: Skin is warm and dry.     Comments: 15 mm nodule, erythema, tender, not yet fluctuant at back of neck with no surrounding cellulitis  Neurological:     General: No focal deficit present.     Mental Status: He is alert. Mental status is at baseline.  Psychiatric:        Mood and Affect: Mood normal.        Thought Content: Thought content normal.      UC Treatments / Results  Labs (all labs ordered are listed, but only abnormal results are displayed) Labs Reviewed - No data to  display  EKG None  Radiology No results found.  Procedures Procedures (including critical care time)  Medications Ordered in UC Medications - No data to display  Initial Impression / Assessment and Plan / UC Course  I have  reviewed the triage vital signs and the nursing notes.  Pertinent labs & imaging results that were available during my care of the patient were reviewed by me and considered in my medical decision making (see chart for details).     Recommend another decontamination He should discuss with PCP Final Clinical Impressions(s) / UC Diagnoses   Final diagnoses:  History of methicillin resistant staphylococcus aureus (MRSA)     Discharge Instructions     I have prescribed doxycycline 1 pill twice a day for 1 week Take with food There are 2 refills for you to use when needed Talk to your primary care doctor about ongoing refills   ED Prescriptions    Medication Sig Dispense Auth. Provider   doxycycline (VIBRAMYCIN) 100 MG capsule Take 1 capsule (100 mg total) by mouth 2 (two) times daily. 14 capsule Raylene Everts, MD     Controlled Substance Prescriptions [place] Controlled Substance Registry consulted? Not Applicable   Raylene Everts, MD 08/28/18 1054

## 2018-09-04 ENCOUNTER — Encounter (HOSPITAL_COMMUNITY): Payer: Self-pay | Admitting: Emergency Medicine

## 2018-09-04 ENCOUNTER — Ambulatory Visit (HOSPITAL_COMMUNITY)
Admission: EM | Admit: 2018-09-04 | Discharge: 2018-09-04 | Disposition: A | Discharge status: Home / Self Care | Payer: Self-pay | Attending: Family Medicine | Admitting: Family Medicine

## 2018-09-04 DIAGNOSIS — M79674 Pain in right toe(s): Secondary | ICD-10-CM | POA: Insufficient documentation

## 2018-09-04 DIAGNOSIS — Z8614 Personal history of Methicillin resistant Staphylococcus aureus infection: Secondary | ICD-10-CM | POA: Insufficient documentation

## 2018-09-04 MED ORDER — HYDROCODONE-ACETAMINOPHEN 5-325 MG PO TABS: 1.0000 | ORAL_TABLET | Freq: Four times a day (QID) | ORAL | 0 refills | Status: DC | PRN

## 2018-09-04 MED ORDER — SULFAMETHOXAZOLE-TRIMETHOPRIM 800-160 MG PO TABS: 1.0000 | ORAL_TABLET | Freq: Two times a day (BID) | ORAL | 0 refills | Status: AC

## 2018-09-04 MED ORDER — SULFAMETHOXAZOLE-TRIMETHOPRIM 800-160 MG PO TABS: 1.0000 | ORAL_TABLET | Freq: Two times a day (BID) | ORAL | 0 refills | Status: DC

## 2018-09-04 NOTE — ED Triage Notes (Signed)
[facility] MRSA infection to left foot since the day before thanksgiving.  States he has had two courses of doxycycline without relief.

## 2018-09-08 MED FILL — ?AMLODIPINE BESYLATE 5 MG T: 5 MG | 30 days supply | Qty: 45 | Fill #5

## 2018-09-08 MED FILL — GLIMEPIRIDE 2 MG TABS: 2 | 30 days supply | Qty: 30 | Fill #10

## 2018-09-08 MED FILL — ?ATORVASTATIN 40MG TABLET: 40 | 30 days supply | Qty: 30 | Fill #6

## 2018-09-09 NOTE — ED Provider Notes (Signed)
[facility]   [id number] 09/04/18 Arrival Time: 1551  ASSESSMENT & PLAN:  1. Pain in toe of right foot   2. History of MRSA infection    Meds ordered this encounter  Medications  . HYDROcodone-acetaminophen (NORCO/VICODIN) 5-325 MG tablet    Sig: Take 1 tablet by mouth every 6 (six) hours as needed for moderate pain or severe pain.    Dispense:  8 tablet    Refill:  0  . sulfamethoxazole-trimethoprim (BACTRIM DS,SEPTRA DS) 800-160 MG tablet    Sig: Take 1 tablet by mouth 2 (two) times daily for 10 days.    Dispense:  20 tablet    Refill:  0   I highly recommend that he schedule f/u with a dermatologist. Not completely sure all of his skin worries are related to MRSA. I do not see any signs of an embolic event causing the discoloration of certain toes. He does report pain out of proportion to what I see though. Dicussed.  [place] Controlled Substances Registry consulted for this patient. I feel the risk/benefit ratio today is favorable for proceeding with this prescription for a controlled substance. Medication sedation precautions given.  He is extremely hesitant to leave without a Rx for an antibiotic. Given Bactrim but he does agree to hold off on taking for a few days and watching to see if this progresses.  Plans to schedule f/u with PCP.  Reviewed expectations re: course of current medical issues. Questions answered. Outlined signs and symptoms indicating need for more acute intervention. Patient verbalized understanding. After Visit Summary given.   SUBJECTIVE:  Kevin [name] is a 52 y.o. male who presents with a skin complaint. Reports history of many skin infections secondary to MRSA. Recently completed a course of doxycycline.   Location: R second toe Onset: gradual Duration: 1-2 days Associated pruritis? none Associated pain? "very painful" No injury. Progression: stable  Drainage? No  Known trigger? No  New  soaps/lotions/topicals/detergents/environmental exposures? No Contacts with similar? No Recent travel? No  Other associated symptoms: none Therapies tried thus far: none Arthralgia or myalgia? none Recent illness? none Fever? none No specific aggravating or alleviating factors reported.  ROS: As per HPI. All other systems negative.   OBJECTIVE: Vitals:   09/04/18 1608  BP: (!) 156/92  Pulse: (!) 123  Resp: 16  Temp: 98.2 F (36.8 C)  TempSrc: Oral  SpO2: 97%    Tachycardia noted. Recheck 114.  General appearance: alert; no distress Lungs: clear to auscultation bilaterally Heart: regular rhythm Extremities: no edema Skin: warm and dry; R second toe with mild distal erythema and mild warmth; small area on tip of R second toe that is slightly bluish/dark; he reports much tenderness with exam; normal capillary refill of all toes of R and L foot; normal DP and PT pulse of RLE Psychological: alert and cooperative; normal mood and affect  No Known Allergies  Past Medical History:  Diagnosis Date  . Diabetes mellitus without complication (HCC)   . History of prediabetes   . Hypertension   . MRSA (methicillin resistant Staphylococcus aureus)    Social History   Socioeconomic History  . Marital status: Divorced    Spouse name: Not on file  . Number of children: Not on file  . Years of education: Not on file  . Highest education level: Not on file  Occupational History  . Not on file  Social Needs  . Financial resource strain: Not on file  . Food insecurity:  Worry: Not on file    Inability: Not on file  . Transportation needs:    Medical: Not on file    Non-medical: Not on file  Tobacco Use  . Smoking status: Former Smoker    Last attempt to quit: 2000    Years since quitting: 20.0  . Smokeless tobacco: Never Used  Substance and Sexual Activity  . Alcohol use: No  . Drug use: No  . Sexual activity: Yes  Lifestyle  . Physical activity:    Days per week:  Not on file    Minutes per session: Not on file  . Stress: Not on file  Relationships  . Social connections:    Talks on phone: Not on file    Gets together: Not on file    Attends religious service: Not on file    Active member of club or organization: Not on file    Attends meetings of clubs or organizations: Not on file    Relationship status: Not on file  . Intimate partner violence:    Fear of current or ex partner: Not on file    Emotionally abused: Not on file    Physically abused: Not on file    Forced sexual activity: Not on file  Other Topics Concern  . Not on file  Social History Narrative  . Not on file   Family History  Problem Relation Age of Onset  . Heart disease Mother   . Hypertension Mother   . Diabetes Sister   . Hypertension Sister   . Stroke Brother   . Diabetes Brother   . Heart disease Brother   . Heart attack Brother   . Hypertension Brother   . Hypertension Father    Past Surgical History:  Procedure Laterality Date  . BACK SURGERY    . LAPAROSCOPIC GASTRIC [place] PulseBANDING       Shametra Cumberland, MD 09/09/18 [phone]

## 2018-09-15 MED FILL — ENALAPRIL MALEATE 20 MG TAB: 20 | 30 days supply | Qty: 60 | Fill #1

## 2018-09-15 MED FILL — OMEPRAZOLE 20 MG CAP: 20 | 30 days supply | Qty: 60 | Fill #7

## 2018-09-19 ENCOUNTER — Other Ambulatory Visit: Payer: Self-pay

## 2018-09-19 ENCOUNTER — Encounter (HOSPITAL_COMMUNITY): Payer: Self-pay

## 2018-09-19 ENCOUNTER — Encounter (HOSPITAL_COMMUNITY): Payer: Self-pay | Admitting: Pharmacy Technician

## 2018-09-19 ENCOUNTER — Encounter: Payer: Self-pay | Admitting: Nurse Practitioner

## 2018-09-19 ENCOUNTER — Ambulatory Visit: Payer: Self-pay | Attending: Nurse Practitioner | Admitting: Nurse Practitioner

## 2018-09-19 ENCOUNTER — Emergency Department (HOSPITAL_COMMUNITY)
Admission: EM | Admit: 2018-09-19 | Discharge: 2018-09-19 | Disposition: A | Discharge status: Home / Self Care | Payer: Self-pay | Attending: Emergency Medicine | Admitting: Emergency Medicine

## 2018-09-19 VITALS — BP 136/94 | HR 103 | Temp 98.2°F | Resp 16 | Wt 198.0 lb

## 2018-09-19 DIAGNOSIS — I10 Essential (primary) hypertension: Secondary | ICD-10-CM | POA: Insufficient documentation

## 2018-09-19 DIAGNOSIS — Z79899 Other long term (current) drug therapy: Secondary | ICD-10-CM | POA: Insufficient documentation

## 2018-09-19 DIAGNOSIS — E785 Hyperlipidemia, unspecified: Secondary | ICD-10-CM

## 2018-09-19 DIAGNOSIS — E119 Type 2 diabetes mellitus without complications: Secondary | ICD-10-CM

## 2018-09-19 DIAGNOSIS — M79672 Pain in left foot: Secondary | ICD-10-CM

## 2018-09-19 DIAGNOSIS — R202 Paresthesia of skin: Secondary | ICD-10-CM

## 2018-09-19 DIAGNOSIS — R238 Other skin changes: Secondary | ICD-10-CM

## 2018-09-19 DIAGNOSIS — E1169 Type 2 diabetes mellitus with other specified complication: Secondary | ICD-10-CM | POA: Insufficient documentation

## 2018-09-19 DIAGNOSIS — M79671 Pain in right foot: Secondary | ICD-10-CM | POA: Insufficient documentation

## 2018-09-19 DIAGNOSIS — Z7984 Long term (current) use of oral hypoglycemic drugs: Secondary | ICD-10-CM | POA: Insufficient documentation

## 2018-09-19 DIAGNOSIS — M79675 Pain in left toe(s): Secondary | ICD-10-CM

## 2018-09-19 DIAGNOSIS — I739 Peripheral vascular disease, unspecified: Secondary | ICD-10-CM | POA: Insufficient documentation

## 2018-09-19 DIAGNOSIS — Z9884 Bariatric surgery status: Secondary | ICD-10-CM | POA: Insufficient documentation

## 2018-09-19 DIAGNOSIS — Z87891 Personal history of nicotine dependence: Secondary | ICD-10-CM | POA: Insufficient documentation

## 2018-09-19 MED ORDER — SITAGLIPTIN PHOS-METFORMIN HCL 50-1000 MG PO TABS: ORAL_TABLET | ORAL | 0 refills | Status: DC

## 2018-09-19 MED ORDER — IPRATROPIUM-ALBUTEROL 0.5-2.5 (3) MG/3ML IN SOLN: 3.0000 mL | Freq: Once | RESPIRATORY_TRACT | Status: DC

## 2018-09-19 NOTE — ED Notes (Signed)
Vascular at the bedside. 

## 2018-09-19 NOTE — Progress Notes (Signed)
Assessment & Plan:  Diagnoses and all orders for this visit:  PVD (peripheral vascular disease) ([place]) ABI- NORMAL  REFERRED TO ED STAT based on physical exam.    Hyperlipidemia associated with type 2 diabetes mellitus ([place]) -     sitaGLIPtin-metformin (JANUMET) 50-1000 MG tablet; TAKE 1 TABLET BY MOUTH 2 (TWO) TIMES DAILY WITH A MEAL. Chronic. Lipid panel pending. Denies any hypo or hyperglycemic symptoms.  Lab Results  Component Value Date   HGBA1C 5.8 (A) 06/02/2018   Lab Results  Component Value Date   LDLCALC 129 (H) 10/23/2017    Patient has been counseled on age-appropriate routine health concerns for screening and prevention. These are reviewed and up-to-date. Referrals have been placed accordingly. Immunizations are up-to-date or declined.    Subjective:   HPI Kevin [name] 53 y.o. male presents to office today with complaints of left foot pain.   Peripheral Vascular Disease: Patient complains of left foot pain and discoloration. Pain is located mainly in the left forefoot The pain is described as aching and burning, numbness and is 8/10 in intensity. Pain is constant. Onset was 2 months ago. Symptoms have been gradually worsening since.  The patient's risks factors for atherosclerosis include diabetes mellitus, hypercholesterolemia, hypertension and former smoker.  The patient denies a history of arteriosclerotic heart disease and cerebral vascular disease.      Lab Results  Component Value Date   HGBA1C 5.8 (A) 06/02/2018    Review of Systems  Constitutional: Negative for fever, malaise/fatigue and weight loss.  HENT: Negative.  Negative for nosebleeds.   Eyes: Negative.  Negative for blurred vision, double vision and photophobia.  Respiratory: Negative.  Negative for cough and shortness of breath.   Cardiovascular: Negative.  Negative for chest pain, palpitations and leg swelling.  Gastrointestinal: Negative.  Negative for heartburn, nausea and vomiting.    Musculoskeletal: Negative.  Negative for myalgias.       SEE HPI  Neurological: Negative.  Negative for dizziness, focal weakness, seizures and headaches.  Psychiatric/Behavioral: Negative.  Negative for suicidal ideas.    Past Medical History:  Diagnosis Date  . Diabetes mellitus without complication ([place])   . History of prediabetes   . Hypertension   . MRSA (methicillin resistant Staphylococcus aureus)     Past Surgical History:  Procedure Laterality Date  . BACK SURGERY    . LAPAROSCOPIC GASTRIC BANDING      Family History  Problem Relation Age of Onset  . Heart disease Mother   . Hypertension Mother   . Diabetes Sister   . Hypertension Sister   . Stroke Brother   . Diabetes Brother   . Heart disease Brother   . Heart attack Brother   . Hypertension Brother   . Hypertension Father     Social History Reviewed with no changes to be made today.   Outpatient Medications Prior to Visit  Medication Sig Dispense Refill  . amLODipine (NORVASC) 5 MG tablet Take 1 tablet (53m) by mouth daily. Take extra 1/2 tablet (2.5 mg) for systolic greater than 1417100 tablet 3  . atorvastatin (LIPITOR) 40 MG tablet Take 1 tablet (40 mg total) by mouth daily. 90 tablet 3  . enalapril (VASOTEC) 20 MG tablet TAKE 2 TABLETS BY MOUTH DAILY. 180 tablet 0  . glimepiride (AMARYL) 2 MG tablet Take 1 tablet (2 mg total) by mouth daily before breakfast. 90 tablet 3  . sitaGLIPtin-metformin (JANUMET) 50-1000 MG tablet TAKE 1 TABLET BY MOUTH 2 (TWO) TIMES DAILY  WITH A MEAL. 180 tablet 0  . acetaminophen (TYLENOL) 500 MG tablet Take 4,000 mg by mouth. EVERY 3 TO 4 HOURS AS NEEDED FOR TOOTHACHE PAIN    . Blood Glucose Monitoring Suppl (TRUE METRIX METER) w/Device KIT Use as instructed 1 kit 0  . doxycycline (VIBRAMYCIN) 100 MG capsule Take 1 capsule (100 mg total) by mouth 2 (two) times daily. (Patient not taking: Reported on 09/19/2018) 14 capsule 2  . glucose blood (TRUE METRIX BLOOD GLUCOSE TEST)  test strip Use as instructed 100 each 12  . HYDROcodone-acetaminophen (NORCO/VICODIN) 5-325 MG tablet Take 1 tablet by mouth every 6 (six) hours as needed for moderate pain or severe pain. (Patient not taking: Reported on 09/19/2018) 8 tablet 0  . omeprazole (PRILOSEC) 20 MG capsule Take 1 capsule (20 mg total) by mouth 2 (two) times daily before a meal. 180 capsule 3  . pneumococcal 23 valent vaccine (PNEUMOVAX 23) 25 MCG/0.5ML injection To be administered by the Pharmacist 0.5 mL 0  . TRUEPLUS LANCETS 28G MISC Use as instructed 100 each 3   No facility-administered medications prior to visit.     No Known Allergies     Objective:    BP (!) 136/94 (BP Location: Left Arm, Patient Position: Sitting, Cuff Size: Large)   Pulse (!) 103   Temp 98.2 F (36.8 C) (Oral)   Resp 16   Wt 198 lb (89.8 kg)   SpO2 98%   BMI 31.01 kg/m  Wt Readings from Last 3 Encounters:  09/19/18 198 lb (89.8 kg)  06/02/18 187 lb 3.2 oz (84.9 kg)  05/01/18 180 lb (81.6 kg)    Physical Exam Vitals signs and nursing note reviewed.  Constitutional:      Appearance: He is well-developed.  HENT:     Head: Normocephalic and atraumatic.     Mouth/Throat:     Dentition: Abnormal dentition (partial edentulous; recent dental extractions due to poor dentition). No dental abscesses or gum lesions.  Neck:     Musculoskeletal: Normal range of motion.  Cardiovascular:     Rate and Rhythm: Normal rate and regular rhythm.     Pulses:          Dorsalis pedis pulses are 1+ on the right side.     Heart sounds: Normal heart sounds. No murmur. No friction rub. No gallop.   Pulmonary:     Effort: Pulmonary effort is normal. No tachypnea or respiratory distress.     Breath sounds: Normal breath sounds. No decreased breath sounds, wheezing, rhonchi or rales.  Chest:     Chest wall: No tenderness.  Abdominal:     General: Bowel sounds are normal.     Palpations: Abdomen is soft.  Musculoskeletal: Normal range of motion.   Feet:     Right foot:     Skin integrity: No warmth.     Comments: SEE PICTURES ABOVE Skin:    General: Skin is warm and dry.  Neurological:     Mental Status: He is alert and oriented to person, place, and time.     Coordination: Coordination normal.  Psychiatric:        Behavior: Behavior normal. Behavior is cooperative.        Thought Content: Thought content normal.        Judgment: Judgment normal.          Patient has been counseled extensively about nutrition and exercise as well as the importance of adherence with medications and regular follow-up. The patient  was given clear instructions to go to ER or return to medical center if symptoms don't improve, worsen or new problems develop. The patient verbalized understanding.   Follow-up: No follow-ups on file.   [name], FNP-BC [facility] [place], [place]   09/19/2018, 10:02 AM

## 2018-09-19 NOTE — Discharge Instructions (Signed)
Go directly to the emergency department at [facility] after leaving here.  Ask to meet with Dr. Chestine Spore, vascular surgeon.  Notify staff that we have called him to meet you in the emergency department.  Your blood pressure today is elevated at 173/109.  It should be rechecked in a week

## 2018-09-19 NOTE — ED Triage Notes (Signed)
[facility]. Pt ambulatory to tx room.

## 2018-09-19 NOTE — ED Provider Notes (Signed)
53 year old male sent here from [facility] long emergency room to be seen by vascular surgeon due to having diminished blood flow to his left lower extremity.  On reexamination in the ED, dorsalis pedis pulse was palpable bilaterally.  Vascular surgeon, Dr. Chestine Spore has seen and evaluated patient and felt that patient can be follow-up outpatient for further evaluation of his condition.  Otherwise at this time, patient is stable for discharge.  Patient was given options of further work-up including ABI but he prefers to be done outpatient, which Dr. Chestine Spore will help set that appointment.   Fayrene Helper, PA-C 09/19/18 1451    Cathren Laine, MD 09/20/18 [phone]

## 2018-09-19 NOTE — ED Notes (Signed)
Patient verbalizes understanding of discharge instructions. Opportunity for questioning and answers were provided. 

## 2018-09-19 NOTE — ED Triage Notes (Signed)
Pt states he was at [facility]. Pt was told he needed to see a vascular surgeon. Pt was at PCP. Pt states his great toe and the 2 next to it on his left foot are "purple'. Pt states he has been dealing with this since Thanksgiving.

## 2018-09-19 NOTE — ED Notes (Addendum)
Patient left prior to receiving d/c paperwork. Last seen ambulatory without difficulty in NAD. Patient was aware to go to [facility] ED.

## 2018-09-19 NOTE — ED Provider Notes (Signed)
[facility] DEPT Provider Note   CSN: [id number] Arrival date & time: 09/19/18  1110     History   Chief Complaint Chief Complaint  Patient presents with  . Toe Pain    HPI Kevin [name] is a 53 y.o. male.  She complains of pain at the great toe and second toe of left foot and slight pain at second or third toe of right foot gradual onset since end of November 2019.  He saw an urgent care center a few weeks ago who advised that he may need contact with a vascular surgeon.  He is required to go through his primary care provider.  He reports that he saw his primary care provider earlier today who advised him to come to the emergency department.  Pain is been progressively worsening over the past 2 to 3 weeks.  Pain is worse with pressing on the area or putting his pants on and cloth rubbing against the skin.  His feet feel cold.  He denies any injury.  Denies denies fever.  No treatment prior to coming here  HPI  Past Medical History:  Diagnosis Date  . Diabetes mellitus without complication ([place])   . History of prediabetes   . Hypertension   . MRSA (methicillin resistant Staphylococcus aureus)     Patient Active Problem List   Diagnosis Date Noted  . Hyperlipidemia associated with type 2 diabetes mellitus ([place]) 09/19/2018  . Hyperlipidemia 03/18/2018  . Dyspnea on exertion 03/18/2018  . Chest pain 03/18/2018  . Diabetes mellitus without complication ([place]) 23/55/7322  . Essential hypertension 11/11/2017    Past Surgical History:  Procedure Laterality Date  . BACK SURGERY    . LAPAROSCOPIC GASTRIC BANDING          Home Medications    Prior to Admission medications   Medication Sig Start Date End Date Taking? Authorizing Provider  acetaminophen (TYLENOL) 500 MG tablet Take 3,000-4,000 mg by mouth every 4 (four) hours as needed for moderate pain, fever or headache.    Yes [provider]  amLODipine (NORVASC) 5 MG tablet Take 1  tablet (18m) by mouth daily. Take extra 1/2 tablet (2.5 mg) for systolic greater than 1025Patient taking differently: Take 2.5-5 mg by mouth See admin instructions. Take 1 tablet (5 mg) by mouth daily. May take an extra 1/2 tablet (2.5 mg) for systolic greater than 14277/30/19  Yes Barrett, Rhonda G, PA-C  atorvastatin (LIPITOR) 40 MG tablet Take 1 tablet (40 mg total) by mouth daily. 03/18/18  Yes BLorretta Harp MD  enalapril (VASOTEC) 20 MG tablet TAKE 2 TABLETS BY MOUTH DAILY. Patient taking differently: Take 40 mg by mouth daily.  08/18/18  Yes FGildardo Pounds NP  glimepiride (AMARYL) 2 MG tablet Take 1 tablet (2 mg total) by mouth daily before breakfast. 02/28/18  Yes FGildardo Pounds NP  omeprazole (PRILOSEC) 20 MG capsule Take 1 capsule (20 mg total) by mouth 2 (two) times daily before a meal. 02/28/18 09/19/18 Yes FGildardo Pounds NP  sitaGLIPtin-metformin (JANUMET) 50-1000 MG tablet TAKE 1 TABLET BY MOUTH 2 (TWO) TIMES DAILY WITH A MEAL. Patient taking differently: Take 1 tablet by mouth 2 (two) times daily with a meal. TAKE 1 TABLET BY MOUTH 2 (TWO) TIMES DAILY WITH A MEAL. 09/19/18  Yes FGildardo Pounds NP  Blood Glucose Monitoring Suppl (TRUE METRIX METER) w/Device KIT Use as instructed 10/23/17   FGildardo Pounds NP  doxycycline (VIBRAMYCIN) 100 MG capsule  Take 1 capsule (100 mg total) by mouth 2 (two) times daily. Patient not taking: Reported on 09/19/2018 08/28/18   Raylene Everts, MD  glucose blood (TRUE METRIX BLOOD GLUCOSE TEST) test strip Use as instructed 10/23/17   [name], NP  HYDROcodone-acetaminophen (NORCO/VICODIN) 5-325 MG tablet Take 1 tablet by mouth every 6 (six) hours as needed for moderate pain or severe pain. Patient not taking: Reported on 09/19/2018 09/04/18   Vanessa Kick, MD  pneumococcal 23 valent vaccine (PNEUMOVAX 23) 25 MCG/0.5ML injection To be administered by the Pharmacist 11/26/17   Tresa Garter, MD  TRUEPLUS LANCETS 28G MISC Use as  instructed 10/23/17   [name], NP    Family History Family History  Problem Relation Age of Onset  . Heart disease Mother   . Hypertension Mother   . Diabetes Sister   . Hypertension Sister   . Stroke Brother   . Diabetes Brother   . Heart disease Brother   . Heart attack Brother   . Hypertension Brother   . Hypertension Father     Social History Social History   Tobacco Use  . Smoking status: Former Smoker    Last attempt to quit: 2000    Years since quitting: 20.0  . Smokeless tobacco: Never Used  Substance Use Topics  . Alcohol use: No  . Drug use: No     Allergies   Patient has no known allergies.   Review of Systems Review of Systems  Allergic/Immunologic: Positive for immunocompromised state.  All other systems reviewed and are negative.    Physical Exam Updated Vital Signs BP (!) 184/119 (BP Location: Right Arm)   Pulse (!) 117   Temp 98.3 F (36.8 C) (Oral)   Resp 16   Ht 5' 7"  (1.702 m)   Wt 86.2 kg   SpO2 99%   BMI 29.76 kg/m   Physical Exam Vitals signs and nursing note reviewed.  Constitutional:      Appearance: He is well-developed.  HENT:     Head: Normocephalic and atraumatic.  Eyes:     Conjunctiva/sclera: Conjunctivae normal.     Pupils: Pupils are equal, round, and reactive to light.  Neck:     Musculoskeletal: Neck supple.     Thyroid: No thyromegaly.     Trachea: No tracheal deviation.  Cardiovascular:     Rate and Rhythm: Regular rhythm.     Heart sounds: No murmur.     Comments: Tachycardic. Pulmonary:     Effort: Pulmonary effort is normal.     Breath sounds: Normal breath sounds.  Abdominal:     General: Bowel sounds are normal. There is no distension.     Palpations: Abdomen is soft.     Tenderness: There is no abdominal tenderness.  Musculoskeletal: Normal range of motion.        General: No tenderness.     Comments: Left lower extremity great toe and second toe are mildly dusky in appearance.,   Tender.  He is able to move the toes well.  DP and PT pulse absent.  Popliteal pulse obtainable by Doppler.  Femoral pulse 2+.  Skin on leg and shin appears normal, with hair present.  Right lower extremity skin appears normal.  DP and PT pulses are absent.  He is able to move toes well.  Popliteal pulse obtainable by Doppler.  Femoral pulses 2+. Gait is normal.  Skin:    General: Skin is warm and dry.  Findings: No rash.  Neurological:     Mental Status: He is alert.     Coordination: Coordination normal.      ED Treatments / Results  Labs (all labs ordered are listed, but only abnormal results are displayed) Labs Reviewed - No data to display  EKG None  Radiology No results found.  Procedures Procedures (including critical care time)  Medications Ordered in ED Medications - No data to display   Initial Impression / Assessment and Plan / ED Course  I have reviewed the triage vital signs and the nursing notes.  Pertinent labs & imaging results that were available during my care of the patient were reviewed by me and considered in my medical decision making (see chart for details).     I discussed case with Dr. Carlis Abbott from vascular surgery.  Patient will be transferred to [facility] emergency department by private vehicle, to be met by him for further evaluation at Dr. Ainsley Spinner request, as he is unable to intervene here.  Patient is agreement with plan. I suspect that patient has peripheral artery disease, which is subacute I spoke with Dr.Steinl is aware of patient's arrival to the emergency department  Patient hypertensive and tachycardic.  He reports that he gets anxious and from doctors.  He also has some degree of pain to explain pain and tachycardia.  He had normal pressure earlier today .suggestblood pressure recheck 1 week Final Clinical Impressions(s) / ED Diagnoses  Dx #1Bilateral foot pain #2 elevated blood pressure Final diagnoses:  None    ED  Discharge Orders    None       Orlie Dakin, MD 09/19/18 1335

## 2018-09-19 NOTE — Progress Notes (Signed)
Has h/o MRSA Concerns about right foot inflammation- burning and pain really bad  Completed 2 rounds of doxy

## 2018-09-19 NOTE — Discharge Instructions (Addendum)
Please call and follow up with Dr. Ophelia Charter office for further care. Return if you have any concerns.

## 2018-09-19 NOTE — ED Notes (Signed)
Per MD request, [facility] ED charge RN made aware patient will arrive POV to be evaluated by vascular surgery for vascular insufficiency.

## 2018-09-19 NOTE — Consult Note (Signed)
Hospital Consult    Reason for Consult: Pain in left foot and unable to obtain doppler signals or pulses Referring Physician: [facility] ED MRN #:  [id number]  History of Present Illness: This is a 53 y.o. male with history of diabetes that has been poorly controlled as well as hypertension, hyperlipidemia, and remote tobacco abuse that presents as a transfer from [facility] for vascular evaluation.  Per report patient [facility] with increasing left foot pain that has been ongoing since Thanksgiving and he was referred to the ED.  When he presented the ED at [facility] ER physician reported they could not find any palpable pedal pulses or signals in the left foot but had a popliteal signal.  Even though symptoms sound chronic we recommended transfer to further evaluate by vascular surgery given reported physical exam.  In discussing with patient he states that he has had pain in his left great and second toe since Thanksgiving.  This does wake him up at night and he also has pain when he walks.  States he quit smoking about 10 years ago.  States his diabetes was very poorly controlled for about 8 years.  Denies any recent trauma.  States has had abscess in toe before and was recently treated with antibiotics.   Was seen by Dr. Gwenlyn Found this summer and had fairly normal echo with EF 50-55%.  Past Medical History:  Diagnosis Date  . Diabetes mellitus without complication ([place])   . History of prediabetes   . Hypertension   . MRSA (methicillin resistant Staphylococcus aureus)     Past Surgical History:  Procedure Laterality Date  . BACK SURGERY    . LAPAROSCOPIC GASTRIC BANDING      No Known Allergies  Prior to Admission medications   Medication Sig Start Date End Date Taking? Authorizing Provider  acetaminophen (TYLENOL) 500 MG tablet Take 3,000-4,000 mg by mouth every 4 (four) hours as needed for moderate pain, fever or headache.      [provider]  amLODipine (NORVASC) 5 MG tablet Take 1 tablet (82m) by mouth daily. Take extra 1/2 tablet (2.5 mg) for systolic greater than 1633Patient taking differently: Take 2.5-5 mg by mouth See admin instructions. Take 1 tablet (5 mg) by mouth daily. May take an extra 1/2 tablet (2.5 mg) for systolic greater than 13547/30/19   Barrett, REvelene Croon PA-C  atorvastatin (LIPITOR) 40 MG tablet Take 1 tablet (40 mg total) by mouth daily. 03/18/18   BLorretta Harp MD  Blood Glucose Monitoring Suppl (TRUE METRIX METER) w/Device KIT Use as instructed 10/23/17   FGildardo Pounds NP  doxycycline (VIBRAMYCIN) 100 MG capsule Take 1 capsule (100 mg total) by mouth 2 (two) times daily. Patient not taking: Reported on 09/19/2018 08/28/18   NRaylene Everts MD  enalapril (VASOTEC) 20 MG tablet TAKE 2 TABLETS BY MOUTH DAILY. Patient taking differently: Take 40 mg by mouth daily.  08/18/18   FGildardo Pounds NP  glimepiride (AMARYL) 2 MG tablet Take 1 tablet (2 mg total) by mouth daily before breakfast. 02/28/18   FGildardo Pounds NP  glucose blood (TRUE METRIX BLOOD GLUCOSE TEST) test strip Use as instructed 10/23/17   FGildardo Pounds NP  HYDROcodone-acetaminophen (NORCO/VICODIN) 5-325 MG tablet Take 1 tablet by mouth every 6 (six) hours as needed for moderate pain or severe pain. Patient not taking: Reported on 09/19/2018 09/04/18   HVanessa Kick MD  omeprazole ([facility]  20 MG capsule Take 1 capsule (20 mg total) by mouth 2 (two) times daily before a meal. 02/28/18 09/19/18  [name], NP  pneumococcal 23 valent vaccine (PNEUMOVAX 23) 25 MCG/0.5ML injection To be administered by the Pharmacist 11/26/17   Tresa Garter, MD  sitaGLIPtin-metformin (JANUMET) 50-1000 MG tablet TAKE 1 TABLET BY MOUTH 2 (TWO) TIMES DAILY WITH A MEAL. Patient taking differently: Take 1 tablet by mouth 2 (two) times daily with a meal. TAKE 1 TABLET BY MOUTH 2 (TWO) TIMES DAILY WITH A MEAL. 09/19/18   [name], NP  TRUEPLUS LANCETS 28G MISC Use as instructed 10/23/17   [name], NP    Social History   Socioeconomic History  . Marital status: Divorced    Spouse name: Not on file  . Number of children: Not on file  . Years of education: Not on file  . Highest education level: Not on file  Occupational History  . Not on file  Social Needs  . Financial resource strain: Not on file  . Food insecurity:    Worry: Not on file    Inability: Not on file  . Transportation needs:    Medical: Not on file    Non-medical: Not on file  Tobacco Use  . Smoking status: Former Smoker    Last attempt to quit: 2000    Years since quitting: 20.0  . Smokeless tobacco: Never Used  Substance and Sexual Activity  . Alcohol use: No  . Drug use: No  . Sexual activity: Yes  Lifestyle  . Physical activity:    Days per week: Not on file    Minutes per session: Not on file  . Stress: Not on file  Relationships  . Social connections:    Talks on phone: Not on file    Gets together: Not on file    Attends religious service: Not on file    Active member of club or organization: Not on file    Attends meetings of clubs or organizations: Not on file    Relationship status: Not on file  . Intimate partner violence:    Fear of current or ex partner: Not on file    Emotionally abused: Not on file    Physically abused: Not on file    Forced sexual activity: Not on file  Other Topics Concern  . Not on file  Social History Narrative  . Not on file     Family History  Problem Relation Age of Onset  . Heart disease Mother   . Hypertension Mother   . Diabetes Sister   . Hypertension Sister   . Stroke Brother   . Diabetes Brother   . Heart disease Brother   . Heart attack Brother   . Hypertension Brother   . Hypertension Father     ROS: _0  Positive   _1  Negative   _2  All sytems reviewed and are negative  Cardiovascular: _3  chest pain/pressure _4  palpitations _5  SOB lying  flat _6  DOE _7  pain in legs while walking _8  pain in legs at rest (left great toe) _9  pain in legs at night (left great toe) _10  non-healing ulcers _11  hx of DVT _12  swelling in legs  Pulmonary: _13  productive cough _14  asthma/wheezing _15  home O2  Neurologic: _16  weakness in _17  arms _18  legs _19  numbness in _20  arms _21  legs _22  hx of CVA _23  mini stroke _24 difficulty speaking or slurred speech _25  temporary loss of vision in  one eye _0  dizziness  Hematologic: _1  hx of cancer _2  bleeding problems _3  problems with blood clotting easily  Endocrine:   _4  diabetes _5  thyroid disease  GI _6  vomiting blood _7  blood in stool  GU: _8  CKD/renal failure _9  HD--_10  M/W/F or _11  T/T/S _12  burning with urination _13  blood in urine  Psychiatric: _14  anxiety _15  depression  Musculoskeletal: _16  arthritis _17  joint pain  Integumentary: _18  rashes _19  ulcers  Constitutional: _20  fever _21  chills   Physical Examination  There were no vitals filed for this visit. There is no height or weight on file to calculate BMI.  General:  WDWN in NAD Gait: Not observed HENT: WNL, normocephalic Pulmonary: normal non-labored breathing, without Rales, rhonchi,  wheezing Cardiac: regular Skin: Small scab on tip of left great toe Vascular Exam/Pulses:  Right Left  Radial 2+ (normal) 2+ (normal)  Ulnar    Femoral 2+ (normal) 2+ (normal)  Popliteal 2+ (normal) 2+ (normal)  DP 2+ (normal) Monophasic signal  PT 2+ (normal) 2+ (normal)   Extremities: Both feet are motor and sensory intact Musculoskeletal: no muscle wasting or atrophy  Neurologic: A&O X 3; Appropriate Affect ; SENSATION: normal; MOTOR FUNCTION:  moving all extremities equally. Speech is fluent/normal   CBC    Component Value Date/Time   WBC 6.8 10/23/2017 1046   WBC 6.4 05/03/2017 0737   RBC 4.80 10/23/2017 1046   RBC 4.92 05/03/2017 0737   HGB 14.2 10/23/2017 1046   HCT 41.2 10/23/2017 1046   PLT 279 10/23/2017 1046   MCV 86  10/23/2017 1046   MCH 29.6 10/23/2017 1046   MCH 30.5 05/03/2017 0737   MCHC 34.5 10/23/2017 1046   MCHC 36.0 05/03/2017 0737   RDW 13.5 10/23/2017 1046   LYMPHSABS 2.0 05/03/2017 0737   MONOABS 0.6 05/03/2017 0737   EOSABS 0.1 05/03/2017 0737   BASOSABS 0.1 05/03/2017 0737    BMET    Component Value Date/Time   NA 139 10/23/2017 1046   K 4.4 10/23/2017 1046   CL 100 10/23/2017 1046   CO2 23 10/23/2017 1046   GLUCOSE 202 (H) 10/23/2017 1046   GLUCOSE 375 (H) 05/03/2017 0737   BUN 7 10/23/2017 1046   CREATININE 0.91 10/23/2017 1046   CALCIUM 9.8 10/23/2017 1046   GFRNONAA 97 10/23/2017 1046   GFRAA 112 10/23/2017 1046    COAGS: Lab Results  Component Value Date   INR 0.96 06/06/2016     Non-Invasive Vascular Imaging:    None  ASSESSMENT/PLAN: This is a 53 y.o. male the presents with left great toe and second toe pain since Thanksgiving as a transfer from [facility] for vascular surgery evaluation.  On my exam he has palpable dorsalis pedis and posterior tibial pulses in the right foot and has a palpable posterior tibial pulse in the left foot.  He is motor and sensory intact and his exam is not suggestive of any acute limb threatening event at this time.  He does have a monophasic dorsalis pedis signal in the left foot and does have risk factors for vascular disease given his chronic poorly controlled diabetes and remote tobacco abuse.  I did offer him ABIs and a left lower extremity arterial duplex to further evaluate (also help r/o any risk for atheroembolic event) and he may benefit from an arteriogram in the future.  Ultimately the patient states due to financial constraints he would prefer to see me in clinic with a short follow-up where he can get the studies done  at that time.  I am agreeable with this plan given that I can appreciate a palpable posterior tibial pulse on exam on the extremity of concern.  He would benefit from an aspirin and statin in the meantime.  Discussed  that his symptoms may not be related to vascular disease but needs additional work-up.  Marty Heck, MD [facility] Office: [phone] Pager: [place]

## 2018-09-20 LAB — CBC
Hematocrit: 40.7 % (ref 37.5–51.0)
Hemoglobin: 14.3 g/dL (ref 13.0–17.7)
MCH: 30 pg (ref 26.6–33.0)
MCHC: 35.1 g/dL (ref 31.5–35.7)
MCV: 85 fL (ref 79–97)
PLATELETS: 267 10*3/uL (ref 150–450)
RBC: 4.77 x10E6/uL (ref 4.14–5.80)
RDW: 12.8 % (ref 11.6–15.4)
WBC: 6.1 10*3/uL (ref 3.4–10.8)

## 2018-09-20 LAB — LIPID PANEL
CHOL/HDL RATIO: 3.6 ratio (ref 0.0–5.0)
CHOLESTEROL TOTAL: 139 mg/dL (ref 100–199)
HDL: 39 mg/dL — AB (ref >39)
LDL CALC: 75 mg/dL (ref 0–99)
Triglycerides: 123 mg/dL (ref 0–149)
VLDL Cholesterol Cal: 25 mg/dL (ref 5–40)

## 2018-09-22 ENCOUNTER — Telehealth: Payer: Self-pay | Admitting: Vascular Surgery

## 2018-09-22 NOTE — Telephone Encounter (Signed)
sch appt spk to pt 10/07/2018 3pm ABI 4pm LE Art 4pm f/u MD

## 2018-09-22 NOTE — Telephone Encounter (Signed)
-----   Message from Cephus Shelling, MD sent at 09/19/2018  3:00 PM EST ----- Level 3 consult in ED.  Can you arrange for him to see me in clinic at next available appointment with ABI and left leg arterial duplex?  Thanks,  Thayer Ohm

## 2018-09-23 ENCOUNTER — Other Ambulatory Visit: Payer: Self-pay

## 2018-09-23 DIAGNOSIS — I739 Peripheral vascular disease, unspecified: Secondary | ICD-10-CM

## 2018-10-06 MED FILL — AMLODIPINE BESYLATE 5 MG TA: 5 | 30 days supply | Qty: 45 | Fill #6

## 2018-10-06 MED FILL — GLIMEPIRIDE 2 MG TABS: 2 | 30 days supply | Qty: 30 | Fill #11

## 2018-10-06 MED FILL — ?ATORVASTATIN 40MG TABLET: 40 | 30 days supply | Qty: 30 | Fill #7

## 2018-10-07 ENCOUNTER — Ambulatory Visit (INDEPENDENT_AMBULATORY_CARE_PROVIDER_SITE_OTHER): Payer: Self-pay | Admitting: Vascular Surgery

## 2018-10-07 ENCOUNTER — Ambulatory Visit (HOSPITAL_COMMUNITY)
Admission: RE | Admit: 2018-10-07 | Discharge: 2018-10-07 | Disposition: A | Discharge status: Home / Self Care | Payer: Self-pay | Source: Ambulatory Visit | Attending: Family | Admitting: Family

## 2018-10-07 ENCOUNTER — Encounter: Payer: Self-pay | Admitting: Vascular Surgery

## 2018-10-07 ENCOUNTER — Ambulatory Visit (INDEPENDENT_AMBULATORY_CARE_PROVIDER_SITE_OTHER)
Admission: RE | Admit: 2018-10-07 | Discharge: 2018-10-07 | Disposition: A | Discharge status: Home / Self Care | Payer: Self-pay | Source: Ambulatory Visit | Attending: Family | Admitting: Family

## 2018-10-07 ENCOUNTER — Other Ambulatory Visit: Payer: Self-pay

## 2018-10-07 DIAGNOSIS — I739 Peripheral vascular disease, unspecified: Secondary | ICD-10-CM | POA: Insufficient documentation

## 2018-10-07 DIAGNOSIS — G8929 Other chronic pain: Secondary | ICD-10-CM | POA: Insufficient documentation

## 2018-10-07 DIAGNOSIS — M79675 Pain in left toe(s): Secondary | ICD-10-CM

## 2018-10-07 NOTE — Progress Notes (Signed)
Patient name: Harith Mccadden MRN: [id number] DOB: 1966-05-23 Sex: male  REASON FOR VISIT: ED follow-up  HPI: Kristy Catoe is a 53 y.o. male history of diabetes and hypertension that presents for follow-up from the ED for ongoing evaluation of left toe pain.  Patient was initially seen in the ED as a transfer from [facility] [place] with concern for a pulseless left lower extremity.  At the time of evaluation he had a palpable posterior tibial pulse and we recommended further work-up in the ED to rule out atheroembolism given his description of toe pain/discoloration but he wanted to be seen as an outpatient.  He presents today and states that his left great toe and second toe have become more discolored with small ulcerations at the tips.  He states this also happened to his right second toe but that has since healed.  He had noninvasive imaging as well as limited arterial duplex of the popliteal fossa to evaluate for any aneurysmal disease.  He denies tobacco abuse and denies IV drug abuse.  Past Medical History:  Diagnosis Date  . Diabetes mellitus without complication ([place])   . History of prediabetes   . Hypertension   . MRSA (methicillin resistant Staphylococcus aureus)     Past Surgical History:  Procedure Laterality Date  . BACK SURGERY    . LAPAROSCOPIC GASTRIC BANDING      Family History  Problem Relation Age of Onset  . Heart disease Mother   . Hypertension Mother   . Diabetes Sister   . Hypertension Sister   . Stroke Brother   . Diabetes Brother   . Heart disease Brother   . Heart attack Brother   . Hypertension Brother   . Hypertension Father     SOCIAL HISTORY: Social History   Tobacco Use  . Smoking status: Former Smoker    Last attempt to quit: 2000    Years since quitting: 20.0  . Smokeless tobacco: Never Used  Substance Use Topics  . Alcohol use: No    No Known Allergies  Current Outpatient Medications  Medication Sig Dispense Refill  . acetaminophen  (TYLENOL) 500 MG tablet Take 3,000-4,000 mg by mouth every 4 (four) hours as needed for moderate pain, fever or headache.     Marland Kitchen amLODipine (NORVASC) 5 MG tablet Take 1 tablet (45m) by mouth daily. Take extra 1/2 tablet (2.5 mg) for systolic greater than 1048(Patient taking differently: Take 2.5-5 mg by mouth See admin instructions. Take 1 tablet (5 mg) by mouth daily. May take an extra 1/2 tablet (2.5 mg) for systolic greater than 1889 100 tablet 3  . atorvastatin (LIPITOR) 40 MG tablet Take 1 tablet (40 mg total) by mouth daily. 90 tablet 3  . Blood Glucose Monitoring Suppl (TRUE METRIX METER) w/Device KIT Use as instructed 1 kit 0  . enalapril (VASOTEC) 20 MG tablet TAKE 2 TABLETS BY MOUTH DAILY. (Patient taking differently: Take 40 mg by mouth daily. ) 180 tablet 0  . glimepiride (AMARYL) 2 MG tablet Take 1 tablet (2 mg total) by mouth daily before breakfast. 90 tablet 3  . glucose blood (TRUE METRIX BLOOD GLUCOSE TEST) test strip Use as instructed 100 each 12  . omeprazole (PRILOSEC) 20 MG capsule Take 1 capsule (20 mg total) by mouth 2 (two) times daily before a meal. 180 capsule 3  . pneumococcal 23 valent vaccine (PNEUMOVAX 23) 25 MCG/0.5ML injection To be administered by the Pharmacist (Patient not taking: Reported on 10/07/2018) 0.5 mL 0  .  sitaGLIPtin-metformin (JANUMET) 50-1000 MG tablet TAKE 1 TABLET BY MOUTH 2 (TWO) TIMES DAILY WITH A MEAL. (Patient taking differently: Take 1 tablet by mouth 2 (two) times daily with a meal. TAKE 1 TABLET BY MOUTH 2 (TWO) TIMES DAILY WITH A MEAL.) 180 tablet 0  . TRUEPLUS LANCETS 28G MISC Use as instructed 100 each 3   No current facility-administered medications for this visit.     REVIEW OF SYSTEMS:  [X]  denotes positive finding, [ ]  denotes negative finding Cardiac  Comments:  Chest pain or chest pressure:    Shortness of breath upon exertion:    Short of breath when lying flat:    Irregular heart rhythm:        Vascular    Pain in calf, thigh,  or hip brought on by ambulation:    Pain in feet at night that wakes you up from your sleep:     Blood clot in your veins:    Leg swelling:         Pulmonary    Oxygen at home:    Productive cough:     Wheezing:         Neurologic    Sudden weakness in arms or legs:     Sudden numbness in arms or legs:     Sudden onset of difficulty speaking or slurred speech:    Temporary loss of vision in one eye:     Problems with dizziness:         Gastrointestinal    Blood in stool:     Vomited blood:         Genitourinary    Burning when urinating:     Blood in urine:        Psychiatric    Major depression:         Hematologic    Bleeding problems:    Problems with blood clotting too easily:        Skin    Rashes or ulcers:        Constitutional    Fever or chills:      PHYSICAL EXAM: Vitals:   10/07/18 1641  BP: (!) 148/93  Pulse: (!) 110  Resp: 20  Temp: 98 F (36.7 C)  SpO2: 93%  Weight: 190 lb (86.2 kg)  Height: 5' 7"  (1.702 m)    GENERAL: The patient is a well-nourished male, in no acute distress. The vital signs are documented above. CARDIAC: There is a regular rate and rhythm.  VASCULAR:  2+ palpable bilateral radial pulse 2+ palpable bilateral femoral pulse 2+ palpable right DP/PT pulse and no tissue loss 2+ palpable left PT/1+ left DP with discoloration at tips of left 1st/2nd toe and small ulceration PULMONARY: There is good air exchange bilaterally without wheezing or rales. ABDOMEN: Soft and non-tender with normal pitched bowel sounds.  MUSCULOSKELETAL: There are no major deformities or cyanosis. NEUROLOGIC: No focal weakness or paresthesias are detected.   DATA:   Limited bilateral lower extremity arterial duplex reveals no evidence of popliteal aneurysm.    His bilateral ABI's are essentially normal with triphasic waveforms.  Assessment/Plan:  In reevaluating Mr. Zenia Resides today he now has discoloration to the tips of his left first and second  toe with small ulcerations that were not apparent when I saw him in the ED.  He states these are extremely painful and occurred several weeks ago after I saw him in the ED.  I am suspicious that he has some atheroembolic event  although the exact etiology is unclear.  He had an echocardiogram this summer that showed no evidence of embolic source.  Limited duplex today shows no evidence of popliteal aneurysm he has essentially normal ABIs at the ankle.  On exam he has a palpable dorsalis pedis/posterior tibial pulse in the right foot and a palpable posterior tibial in the left foot and a weak dorsalis pedis that is palpable.  I recommended a CTA abdomen pelvis bilateral lower extremity runoff and I will also see if we get the chest as well to evaluate for some proximal embolic source since he says both of his feet are affected at times.  I will try to get the CT as soon as possible and have him follow-up with me to further discuss options.   Marty Heck, MD [facility] [place] Office: [phone] Pager: [place]

## 2018-10-08 ENCOUNTER — Other Ambulatory Visit: Payer: Self-pay

## 2018-10-08 DIAGNOSIS — I739 Peripheral vascular disease, unspecified: Secondary | ICD-10-CM

## 2018-10-08 DIAGNOSIS — G8929 Other chronic pain: Secondary | ICD-10-CM

## 2018-10-08 DIAGNOSIS — M79675 Pain in left toe(s): Secondary | ICD-10-CM

## 2018-10-13 MED FILL — ENALAPRIL MALEATE 20 MG TAB: 20 | 30 days supply | Qty: 60 | Fill #2

## 2018-10-13 MED FILL — OMEPRAZOLE 20 MG CAP: 20 | 30 days supply | Qty: 60 | Fill #8

## 2018-10-20 ENCOUNTER — Other Ambulatory Visit: Payer: Self-pay

## 2018-10-20 ENCOUNTER — Inpatient Hospital Stay: Admission: RE | Admit: 2018-10-20 | Payer: Self-pay | Source: Ambulatory Visit

## 2018-10-20 ENCOUNTER — Ambulatory Visit
Admission: RE | Admit: 2018-10-20 | Discharge: 2018-10-20 | Disposition: A | Discharge status: Home / Self Care | Payer: No Typology Code available for payment source | Source: Ambulatory Visit | Attending: Vascular Surgery | Admitting: Vascular Surgery

## 2018-10-20 DIAGNOSIS — I739 Peripheral vascular disease, unspecified: Secondary | ICD-10-CM

## 2018-10-20 DIAGNOSIS — M79675 Pain in left toe(s): Secondary | ICD-10-CM

## 2018-10-20 DIAGNOSIS — G8929 Other chronic pain: Secondary | ICD-10-CM

## 2018-10-20 MED ORDER — IOPAMIDOL (ISOVUE-370) INJECTION 76%
100.0000 mL | Freq: Once | INTRAVENOUS | Status: AC | PRN
  Administered 2018-10-20: 100 mL via INTRAVENOUS

## 2018-10-21 ENCOUNTER — Encounter: Payer: Self-pay | Admitting: *Deleted

## 2018-10-21 ENCOUNTER — Other Ambulatory Visit: Payer: Self-pay

## 2018-10-21 ENCOUNTER — Ambulatory Visit (INDEPENDENT_AMBULATORY_CARE_PROVIDER_SITE_OTHER): Payer: No Typology Code available for payment source | Admitting: Vascular Surgery

## 2018-10-21 ENCOUNTER — Encounter: Payer: Self-pay | Admitting: Vascular Surgery

## 2018-10-21 VITALS — BP 183/117 | HR 100 | Temp 97.8°F | Resp 18 | Ht 67.0 in | Wt 197.0 lb

## 2018-10-21 DIAGNOSIS — M79675 Pain in left toe(s): Secondary | ICD-10-CM

## 2018-10-21 DIAGNOSIS — G8929 Other chronic pain: Secondary | ICD-10-CM

## 2018-10-21 NOTE — Progress Notes (Signed)
Patient name: Kevin [name] MRN: [id number] DOB: Mar 25, 1966 Sex: male  REASON FOR VISIT: Follow-up after CTA  HPI: Kevin [name] is a 53 y.o. male history of diabetes and hypertension that presents for follow-up from the ED for ongoing evaluation of left toe pain/discoloration.  Patient was initially seen in the ED as a transfer from [place] [facility] with concern for a pulseless left lower extremity.  At the time of evaluation he had a palpable posterior tibial pulse and we recommended further work-up in the ED to rule out atheroembolism given his description of toe pain/discoloration but he wanted to be seen as an outpatient.  He was seen in clinic for follow-up with ongoing discoloration and ulceration at tips of left great and second toe.  Given his history of acutely painful events when the toes become discolored and painful with interval improvement I was concerned about atheroembolic event.  He presents for follow-up today after CTA chest, abdomen, pelvis, lower extremities.  Still complaining of pain in left great and second toe - not worse.  Continues to deny tobacco abuse.  Denies IV drug use.  Past Medical History:  Diagnosis Date  . Diabetes mellitus without complication ([place])   . History of prediabetes   . Hypertension   . MRSA (methicillin resistant Staphylococcus aureus)     Past Surgical History:  Procedure Laterality Date  . BACK SURGERY    . LAPAROSCOPIC GASTRIC BANDING      Family History  Problem Relation Age of Onset  . Heart disease Mother   . Hypertension Mother   . Diabetes Sister   . Hypertension Sister   . Stroke Brother   . Diabetes Brother   . Heart disease Brother   . Heart attack Brother   . Hypertension Brother   . Hypertension Father     SOCIAL HISTORY: Social History   Tobacco Use  . Smoking status: Former Smoker    Last attempt to quit: 2000    Years since quitting: 20.1  . Smokeless tobacco: Never Used  Substance Use Topics  . Alcohol use: No     No Known Allergies  Current Outpatient Medications  Medication Sig Dispense Refill  . acetaminophen (TYLENOL) 500 MG tablet Take 3,000-4,000 mg by mouth every 4 (four) hours as needed for moderate pain, fever or headache.     Marland Kitchen amLODipine (NORVASC) 5 MG tablet Take 1 tablet (72m) by mouth daily. Take extra 1/2 tablet (2.5 mg) for systolic greater than 1601(Patient taking differently: Take 2.5-5 mg by mouth See admin instructions. Take 1 tablet (5 mg) by mouth daily. May take an extra 1/2 tablet (2.5 mg) for systolic greater than 1093 100 tablet 3  . atorvastatin (LIPITOR) 40 MG tablet Take 1 tablet (40 mg total) by mouth daily. 90 tablet 3  . Blood Glucose Monitoring Suppl (TRUE METRIX METER) w/Device KIT Use as instructed 1 kit 0  . enalapril (VASOTEC) 20 MG tablet TAKE 2 TABLETS BY MOUTH DAILY. (Patient taking differently: Take 40 mg by mouth daily. ) 180 tablet 0  . glimepiride (AMARYL) 2 MG tablet Take 1 tablet (2 mg total) by mouth daily before breakfast. 90 tablet 3  . glucose blood (TRUE METRIX BLOOD GLUCOSE TEST) test strip Use as instructed 100 each 12  . pneumococcal 23 valent vaccine (PNEUMOVAX 23) 25 MCG/0.5ML injection To be administered by the Pharmacist 0.5 mL 0  . sitaGLIPtin-metformin (JANUMET) 50-1000 MG tablet TAKE 1 TABLET BY MOUTH 2 (TWO) TIMES DAILY WITH A MEAL. (  Patient taking differently: Take 1 tablet by mouth 2 (two) times daily with a meal. TAKE 1 TABLET BY MOUTH 2 (TWO) TIMES DAILY WITH A MEAL.) 180 tablet 0  . TRUEPLUS LANCETS 28G MISC Use as instructed 100 each 3  . omeprazole (PRILOSEC) 20 MG capsule Take 1 capsule (20 mg total) by mouth 2 (two) times daily before a meal. 180 capsule 3   No current facility-administered medications for this visit.     REVIEW OF SYSTEMS:  [X]  denotes positive finding, [ ]  denotes negative finding Cardiac  Comments:  Chest pain or chest pressure:    Shortness of breath upon exertion:    Short of breath when lying flat:     Irregular heart rhythm:        Vascular    Pain in calf, thigh, or hip brought on by ambulation:    Pain in feet at night that wakes you up from your sleep:  x Left great and second toe  Blood clot in your veins:    Leg swelling:         Pulmonary    Oxygen at home:    Productive cough:     Wheezing:         Neurologic    Sudden weakness in arms or legs:     Sudden numbness in arms or legs:     Sudden onset of difficulty speaking or slurred speech:    Temporary loss of vision in one eye:     Problems with dizziness:         Gastrointestinal    Blood in stool:     Vomited blood:         Genitourinary    Burning when urinating:     Blood in urine:        Psychiatric    Major depression:         Hematologic    Bleeding problems:    Problems with blood clotting too easily:        Skin    Rashes or ulcers:        Constitutional    Fever or chills:      PHYSICAL EXAM: Vitals:   10/21/18 0827 10/21/18 0829  BP: (!) 215/104 (!) 183/117  Pulse: 100 100  Resp: 18   Temp: 97.8 F (36.6 C)   TempSrc: Oral   SpO2: 100%   Weight: 197 lb (89.4 kg)   Height: 5' 7"  (1.702 m)     GENERAL: The patient is a well-nourished male, in no acute distress. The vital signs are documented above. CARDIAC: There is a regular rate and rhythm.  VASCULAR:  2+ palpable bilateral radial pulse 2+ palpable bilateral femoral pulse 2+ palpable right DP/PT pulse and no tissue loss 2+ palpable left PT with discoloration at tips of left 1st/2nd toe and small ulceration, DP not palpable today PULMONARY: There is good air exchange bilaterally without wheezing or rales. ABDOMEN: Soft and non-tender with normal pitched bowel sounds.  MUSCULOSKELETAL: There are no major deformities or cyanosis. NEUROLOGIC: No focal weakness or paresthesias are detected.   DATA:   I independently reviewed his CTA chest abdomen pelvis and bilateral lower extremity runoff.  I see no aneurysmal disease and/or  significant plaque or mural thrombus that would explain an atheroembolic event particularly given that his left lower extremity is the more affected extremity.  There is some minimal plaque in the right common iliac that is essentially unimpressive.  Assessment/Plan:  In  reevaluating Mr. Zenia Resides today he has ongoing discoloration to the tips of his left first and second toe with small ulcerations.  He continues to state that these are extremely painful.  I was suspicious that this representeed some atheroembolic event although the exact etiology was unclear.  However after reviewing his CTA abdomen pelvis lower extremity runoff I see no evidence of an atheroembolic lesion that would explain an atheroembolic event.  My only other thought is that the tibial evaluation was poor on the CTA and I suspect this was likely due to contrast timing (given bilateral finding).  He has a long history of diabetes that was poorly controlled for 10 years and this may be all small vessel disease in his foot that is not amenable to intervention.  Again he has a palpable left PT today, DP hard to apprecaite.  I think the only way to sort this out would be to do a lower extremity arteriogram.  He is at the point now that he has asked about having his toes cut off due to the pain and discomfort.  Given his significant symptoms I think it would be reasonable to do an arteriogram to say for sure that he does not have any significant tibial disease and/or small vessel disease in the foot.  We will schedule for next available date.  Denies tobacco abuse to suggest [place], etc but can further evaluate with arteriogram.   Marty Heck, MD [facility] Office: [phone] Pager: [place]

## 2018-10-27 ENCOUNTER — Other Ambulatory Visit: Payer: Self-pay

## 2018-10-28 ENCOUNTER — Ambulatory Visit: Payer: Self-pay | Admitting: Vascular Surgery

## 2018-10-29 ENCOUNTER — Ambulatory Visit: Payer: Self-pay

## 2018-10-29 ENCOUNTER — Other Ambulatory Visit: Payer: Self-pay | Admitting: *Deleted

## 2018-11-03 ENCOUNTER — Ambulatory Visit: Payer: Self-pay | Attending: Family Medicine

## 2018-11-04 MED FILL — ?ATORVASTATIN 40MG TABLET: 40 | 30 days supply | Qty: 30 | Fill #8

## 2018-11-04 MED FILL — GLIMEPIRIDE 2 MG TABS: 2 | 30 days supply | Qty: 30 | Fill #0

## 2018-11-04 MED FILL — AMLODIPINE BESYLATE 5 MG TA: 5 | 30 days supply | Qty: 45 | Fill #7

## 2018-11-05 ENCOUNTER — Ambulatory Visit: Payer: Self-pay | Attending: Family Medicine

## 2018-11-06 MED FILL — DOXYCYCLINE HYCLATE 100 MG: 100 | 7 days supply | Qty: 14 | Fill #1

## 2018-11-10 ENCOUNTER — Other Ambulatory Visit: Payer: Self-pay | Admitting: Nurse Practitioner

## 2018-11-10 DIAGNOSIS — I10 Essential (primary) hypertension: Secondary | ICD-10-CM

## 2018-11-10 MED FILL — ?OMEPRAZOLE 20 MG CAPSULE D: 20 | 30 days supply | Qty: 60 | Fill #9

## 2018-11-10 MED FILL — ENALAPRIL MALEATE 20 MG TAB: 20 | 30 days supply | Qty: 60 | Fill #0

## 2018-11-12 ENCOUNTER — Telehealth: Payer: Self-pay | Admitting: *Deleted

## 2018-11-12 NOTE — Telephone Encounter (Signed)
Patient called last week saying that he wished to see if we could move up his aortogram from 11-19-2018. I had left 5 messages for him [phone]) over the past week because I had an opening on Wednesday 11-12-2018. Patient finally returned my call today (11-12-18),but left a VM that he wanted to cancel his procedure all together and wanted me to call him back to confirm. I immediately tried to call him back and had to leave another VM on that same phone number to call me back to reschedule. I will cancel his aortogram.

## 2018-11-19 ENCOUNTER — Encounter (HOSPITAL_COMMUNITY): Payer: Self-pay

## 2018-11-19 ENCOUNTER — Ambulatory Visit (HOSPITAL_COMMUNITY): Admit: 2018-11-19 | Payer: Self-pay | Admitting: Vascular Surgery

## 2018-11-19 SURGERY — ABDOMINAL AORTOGRAM W/LOWER EXTREMITY
Anesthesia: LOCAL

## 2018-12-03 ENCOUNTER — Ambulatory Visit: Payer: Self-pay | Admitting: Nurse Practitioner

## 2018-12-16 ENCOUNTER — Other Ambulatory Visit: Payer: Self-pay | Admitting: Family Medicine

## 2018-12-16 DIAGNOSIS — I10 Essential (primary) hypertension: Secondary | ICD-10-CM

## 2018-12-16 MED FILL — ?ATORVASTATIN 40MG TABLET: 40 | 90 days supply | Qty: 90 | Fill #9

## 2018-12-16 MED FILL — ?OMEPRAZOLE 20 MG CAPSULE D: 20 | 60 days supply | Qty: 120 | Fill #10

## 2018-12-16 MED FILL — GLIMEPIRIDE 2 MG TABS: 2 | 90 days supply | Qty: 90 | Fill #1

## 2018-12-18 MED FILL — ENALAPRIL MALEATE 20 MG TAB: 20 | 30 days supply | Qty: 60 | Fill #0

## 2019-01-28 ENCOUNTER — Other Ambulatory Visit: Payer: Self-pay | Admitting: Family Medicine

## 2019-01-28 DIAGNOSIS — I10 Essential (primary) hypertension: Secondary | ICD-10-CM

## 2019-01-28 MED FILL — ENALAPRIL MALEATE 20 MG TAB: 20 | 30 days supply | Qty: 60 | Fill #0

## 2019-01-30 ENCOUNTER — Telehealth: Payer: Self-pay

## 2019-01-30 ENCOUNTER — Telehealth: Payer: Self-pay | Admitting: Nurse Practitioner

## 2019-01-30 ENCOUNTER — Inpatient Hospital Stay: Admission: RE | Admit: 2019-01-30 | Payer: Self-pay | Source: Ambulatory Visit

## 2019-01-30 DIAGNOSIS — G8929 Other chronic pain: Secondary | ICD-10-CM

## 2019-01-30 DIAGNOSIS — M5440 Lumbago with sciatica, unspecified side: Secondary | ICD-10-CM

## 2019-01-30 MED ORDER — PREDNISONE 20 MG PO TABS: ORAL_TABLET | ORAL | 0 refills | Status: DC

## 2019-01-30 MED ORDER — CYCLOBENZAPRINE HCL 10 MG PO TABS: 10.0000 mg | ORAL_TABLET | Freq: Three times a day (TID) | ORAL | 1 refills | Status: DC | PRN

## 2019-01-30 NOTE — Progress Notes (Signed)

## 2019-02-12 ENCOUNTER — Encounter (HOSPITAL_COMMUNITY): Payer: Self-pay | Admitting: Emergency Medicine

## 2019-02-12 ENCOUNTER — Other Ambulatory Visit: Payer: Self-pay

## 2019-02-12 ENCOUNTER — Ambulatory Visit (INDEPENDENT_AMBULATORY_CARE_PROVIDER_SITE_OTHER): Payer: Self-pay

## 2019-02-12 ENCOUNTER — Ambulatory Visit (HOSPITAL_COMMUNITY)
Admission: EM | Admit: 2019-02-12 | Discharge: 2019-02-12 | Disposition: A | Discharge status: Home / Self Care | Payer: Self-pay | Attending: Family Medicine | Admitting: Family Medicine

## 2019-02-12 DIAGNOSIS — G8929 Other chronic pain: Secondary | ICD-10-CM

## 2019-02-12 DIAGNOSIS — M5440 Lumbago with sciatica, unspecified side: Secondary | ICD-10-CM

## 2019-02-12 DIAGNOSIS — I10 Essential (primary) hypertension: Secondary | ICD-10-CM

## 2019-02-12 MED ORDER — CLONIDINE HCL 0.1 MG PO TABS
ORAL_TABLET | ORAL | Status: AC
  Filled 2019-02-12: qty 1

## 2019-02-12 MED ORDER — CLONIDINE HCL 0.1 MG PO TABS
0.1000 mg | ORAL_TABLET | Freq: Once | ORAL | Status: AC
  Administered 2019-02-12: 10:00:00 0.1 mg via ORAL

## 2019-02-12 MED ORDER — KETOROLAC TROMETHAMINE 60 MG/2ML IM SOLN: 60.0000 mg | Freq: Once | INTRAMUSCULAR | Status: DC

## 2019-02-12 MED ORDER — HYDROCODONE-ACETAMINOPHEN 5-325 MG PO TABS: 1.0000 | ORAL_TABLET | Freq: Four times a day (QID) | ORAL | 0 refills | Status: DC | PRN

## 2019-02-12 MED ORDER — HYDROCODONE-ACETAMINOPHEN 5-325 MG PO TABS
1.0000 | ORAL_TABLET | Freq: Once | ORAL | Status: AC
  Administered 2019-02-12: 1 via ORAL

## 2019-02-12 MED ORDER — HYDROCODONE-ACETAMINOPHEN 5-325 MG PO TABS
ORAL_TABLET | ORAL | Status: AC
  Filled 2019-02-12: qty 1

## 2019-02-12 NOTE — Discharge Instructions (Addendum)
Your x ray showed some degenerative changes  Since the prednisone is not working I will give you a few pain pills to help.  You can continue the muscle relaxants.  We gave you clonidine here for your BP and hydrocodone for pain.  Your vital signs are improved.  Please get a BP monitor to monitor your blood pressure and pulse rate.  If your symptoms continue or worsen you will need to see primary care or go to the ER.

## 2019-02-12 NOTE — ED Triage Notes (Signed)
PT broke his back in 1986. He has had more than one surgery in the 2000s along with PT and several injections.  PT reports 3-4 weeks ago he bent down and tweaked his back. PT has done a virtual visit with [place] and did a course of prednisone and cyclobenzaprine. Neither of these helped.

## 2019-02-12 NOTE — ED Provider Notes (Signed)
[facility]    CSN: [id number] Arrival date & time: 02/12/19  0836     History   Chief Complaint Chief Complaint  Patient presents with  . Appointment  . Back Pain    HPI Kevin [name] is a 53 y.o. male.   Patient is a 53 year old male with past medical history of diabetes, hypertension, MRSA, chronic back pain, multiple back surgeries, PVD.  He presents today with chronic back pain that is worsened.  He has had issues with his back since 1986 when he had lumbar fracture.  He has had multiple surgeries since with injections, chronic pain med use.  He is currently not on any chronic pain meds.  Patient reports about 3 to 4 weeks ago he bent down to lift up some limbs from the yard and felt a pull and sharp pain in his back.  Since the pain has remained the same.  He has finished a course of prednisone and Flexeril which did not even touch the pain.  He does have some numbness and tingling that radiates into the legs.  Denies any loss of bowel or bladder function. Pt is very hypertensive here today in clinic. Reports that he is very anxious and 10/10 pain. He did take his BP meds this am. Denies any associated dizziness, headaches, vision changes. No fever.   ROS per HPI      Past Medical History:  Diagnosis Date  . Diabetes mellitus without complication ([place])   . History of prediabetes   . Hypertension   . MRSA (methicillin resistant Staphylococcus aureus)     Patient Active Problem List   Diagnosis Date Noted  . Toe pain, chronic, left 10/07/2018  . Hyperlipidemia associated with type 2 diabetes mellitus ([place]) 09/19/2018  . Hyperlipidemia 03/18/2018  . Dyspnea on exertion 03/18/2018  . Chest pain 03/18/2018  . Diabetes mellitus without complication ([place]) 71/02/2693  . Essential hypertension 11/11/2017    Past Surgical History:  Procedure Laterality Date  . BACK SURGERY    . LAPAROSCOPIC GASTRIC BANDING         Home Medications    Prior to Admission  medications   Medication Sig Start Date End Date Taking? Authorizing Provider  amLODipine (NORVASC) 5 MG tablet Take 1 tablet (81m) by mouth daily. Take extra 1/2 tablet (2.5 mg) for systolic greater than 1854Patient taking differently: Take 2.5-5 mg by mouth See admin instructions. Take 1 tablet (5 mg) by mouth daily. May take an extra 1/2 tablet (2.5 mg) for systolic greater than 16277/30/19  Yes Barrett, Rhonda G, PA-C  atorvastatin (LIPITOR) 40 MG tablet Take 1 tablet (40 mg total) by mouth daily. 03/18/18  Yes BLorretta Harp MD  cyclobenzaprine (FLEXERIL) 10 MG tablet Take 1 tablet (10 mg total) by mouth 3 (three) times daily as needed for muscle spasms. 01/30/19  Yes Martin, Mary-Margaret, FNP  enalapril (VASOTEC) 20 MG tablet Take 2 tablets (40 mg total) by mouth daily. Needs office visit. 01/28/19  Yes NCharlott Rakes MD  glimepiride (AMARYL) 2 MG tablet Take 1 tablet (2 mg total) by mouth daily before breakfast. 02/28/18  Yes FGildardo Pounds NP  omeprazole (PRILOSEC) 20 MG capsule Take 1 capsule (20 mg total) by mouth 2 (two) times daily before a meal. 02/28/18 02/12/19 Yes FGildardo Pounds NP  predniSONE (DELTASONE) 20 MG tablet 2 po at sametime daily for 5 days 01/30/19  Yes MHassell Done Mary-Margaret, FNP  sitaGLIPtin-metformin (JANUMET) 50-1000 MG tablet TAKE 1 TABLET BY  MOUTH 2 (TWO) TIMES DAILY WITH A MEAL. Patient taking differently: Take 1 tablet by mouth 2 (two) times daily with a meal. TAKE 1 TABLET BY MOUTH 2 (TWO) TIMES DAILY WITH A MEAL. 09/19/18  Yes [name], NP  acetaminophen (TYLENOL) 500 MG tablet Take 3,000-4,000 mg by mouth every 4 (four) hours as needed for moderate pain, fever or headache.     [provider]  Blood Glucose Monitoring Suppl (TRUE METRIX METER) w/Device KIT Use as instructed 10/23/17   [name], NP  glucose blood (TRUE METRIX BLOOD GLUCOSE TEST) test strip Use as instructed 10/23/17   [name], NP  HYDROcodone-acetaminophen  (NORCO/VICODIN) 5-325 MG tablet Take 1-2 tablets by mouth every 6 (six) hours as needed. 02/12/19   Orvan July, NP  pneumococcal 23 valent vaccine (PNEUMOVAX 23) 25 MCG/0.5ML injection To be administered by the Pharmacist 11/26/17   Tresa Garter, MD  TRUEPLUS LANCETS 28G MISC Use as instructed 10/23/17   [name], NP    Family History Family History  Problem Relation Age of Onset  . Heart disease Mother   . Hypertension Mother   . Diabetes Sister   . Hypertension Sister   . Stroke Brother   . Diabetes Brother   . Heart disease Brother   . Heart attack Brother   . Hypertension Brother   . Hypertension Father     Social History Social History   Tobacco Use  . Smoking status: Former Smoker    Last attempt to quit: 2000    Years since quitting: 20.4  . Smokeless tobacco: Never Used  Substance Use Topics  . Alcohol use: No  . Drug use: No     Allergies   Patient has no known allergies.   Review of Systems Review of Systems   Physical Exam Triage Vital Signs ED Triage Vitals  Enc Vitals Group     BP 02/12/19 0854 (!) 187/115     Pulse Rate 02/12/19 0854 (!) 126     Resp 02/12/19 0854 16     Temp 02/12/19 0854 98.1 F (36.7 C)     Temp Source 02/12/19 0854 Oral     SpO2 02/12/19 0854 98 %     Weight --      Height --      Head Circumference --      Peak Flow --      Pain Score 02/12/19 0851 7     Pain Loc --      Pain Edu? --      Excl. in [place]? --    No data found.  Updated Vital Signs BP (!) 139/100 (BP Location: Right Arm)   Pulse (!) 118   Temp 98.1 F (36.7 C) (Oral)   Resp 16   SpO2 98%   Visual Acuity Right Eye Distance:   Left Eye Distance:   Bilateral Distance:    Right Eye Near:   Left Eye Near:    Bilateral Near:     Physical Exam Vitals signs and nursing note reviewed.  Constitutional:      General: He is not in acute distress.    Appearance: He is not ill-appearing, toxic-appearing or diaphoretic.     Comments:  Appears in pain   HENT:     Head: Normocephalic and atraumatic.     Nose: Nose normal.  Eyes:     Extraocular Movements: Extraocular movements intact.     Conjunctiva/sclera: Conjunctivae normal.  Pupils: Pupils are equal, round, and reactive to light.  Neck:     Musculoskeletal: Normal range of motion.  Cardiovascular:     Rate and Rhythm: Tachycardia present.     Pulses: Normal pulses.  Pulmonary:     Effort: Pulmonary effort is normal.  Musculoskeletal:     Comments: Pt very limited ROM of the spine and unable to perform exercises due to the pain.  Pt standing leaning against 2 chairs, unable to sit.  No bruising, swelling, deformity.   Skin:    General: Skin is warm and dry.     Findings: No rash.  Neurological:     General: No focal deficit present.     Mental Status: He is alert and oriented to person, place, and time.     Cranial Nerves: No cranial nerve deficit.     Sensory: No sensory deficit.     Motor: No weakness.     Coordination: Coordination normal.     Gait: Gait normal.      UC Treatments / Results  Labs (all labs ordered are listed, but only abnormal results are displayed) Labs Reviewed - No data to display  EKG None  Radiology Dg Lumbar Spine Complete  Result Date: 02/12/2019 CLINICAL DATA:  Low back pain EXAM: LUMBAR SPINE - COMPLETE 4+ VIEW COMPARISON:  None. FINDINGS: Five lumbar type vertebral bodies are well visualized. Vertebral body height is well maintained. No anterolisthesis is noted. Interbody fusion at L5-S1 is noted with anterior fixation. Mild disc space narrowing at L4-5 is seen with mild osteophytic changes. No soft tissue abnormality is noted. IMPRESSION: Postsurgical and degenerative changes without acute abnormality. Electronically Signed   By: Inez Catalina M.D.   On: 02/12/2019 09:46    Procedures Procedures (including critical care time)  Medications Ordered in UC Medications  cloNIDine (CATAPRES) tablet 0.1 mg (0.1 mg  Oral Given 02/12/19 1026)  HYDROcodone-acetaminophen (NORCO/VICODIN) 5-325 MG per tablet 1 tablet (1 tablet Oral Given 02/12/19 1025)    Initial Impression / Assessment and Plan / UC Course  I have reviewed the triage vital signs and the nursing notes.  Pertinent labs & imaging results that were available during my care of the patient were reviewed by me and considered in my medical decision making (see chart for details).     Patient is a 53 year old male that comes chronic back pain.  This is worsened and become more severe over the last 4 to 5 weeks.  He has failed prednisone and Flexeril.  He has been taking a max dose of Tylenol without any relief. Reports that he has not slept in the last 3 nights due to the pain.  He has been drinking increased amounts of caffeine. He is contributing this to his increased blood pressure and heart rate. He is also very nervous about the corona virus. Reports that he did take his blood pressure medicine this morning.  X-ray of the lumbar spine did not reveal any acute changes. Blood pressure at the highest was 190/116 with a heart rate of 130. We will give him hydrocodone and clonidine here to see if this will help with his pain and bring his blood pressure and pulse down.   Pt feeling some pain relief from the pain medication BP and heart rate has decreased from the clonidine.  Checked pulse manually and was about 90.  He is neurologically intact. No concerning signs or symptoms.  No headache, chest pain, SOB.  I feel it is safe  to send him home and monitor BP and pulse.  Take the pain meds as prescribed. He also has flexeril at home.  If symptoms do not improve or worsen he will need to follow up with PCP, neuro or go to the ER.  Pt understanding and agreed     Final Clinical Impressions(s) / UC Diagnoses   Final diagnoses:  Chronic low back pain with sciatica, sciatica laterality unspecified, unspecified back pain laterality  Essential  hypertension     Discharge Instructions     Your x ray showed some degenerative changes  Since the prednisone is not working I will give you a few pain pills to help.  You can continue the muscle relaxants.  We gave you clonidine here for your BP and hydrocodone for pain.  Your vital signs are improved.  Please get a BP monitor to monitor your blood pressure and pulse rate.  If your symptoms continue or worsen you will need to see primary care or go to the ER.     ED Prescriptions    Medication Sig Dispense Auth. Provider   HYDROcodone-acetaminophen (NORCO/VICODIN) 5-325 MG tablet Take 1-2 tablets by mouth every 6 (six) hours as needed. 12 tablet Loura Halt A, NP     Controlled Substance Prescriptions [place] Controlled Substance Registry consulted? Not Applicable   Orvan July, NP 02/13/19 1037

## 2019-02-25 ENCOUNTER — Other Ambulatory Visit: Payer: Self-pay

## 2019-02-25 ENCOUNTER — Encounter (HOSPITAL_COMMUNITY): Payer: Self-pay

## 2019-02-25 ENCOUNTER — Ambulatory Visit (HOSPITAL_COMMUNITY)
Admission: EM | Admit: 2019-02-25 | Discharge: 2019-02-25 | Disposition: A | Discharge status: Home / Self Care | Payer: Self-pay | Attending: Internal Medicine | Admitting: Internal Medicine

## 2019-02-25 DIAGNOSIS — L255 Unspecified contact dermatitis due to plants, except food: Secondary | ICD-10-CM

## 2019-02-25 DIAGNOSIS — I160 Hypertensive urgency: Secondary | ICD-10-CM

## 2019-02-25 DIAGNOSIS — L237 Allergic contact dermatitis due to plants, except food: Secondary | ICD-10-CM

## 2019-02-25 MED ORDER — PREDNISONE 10 MG PO TABS: 20.0000 mg | ORAL_TABLET | Freq: Every day | ORAL | 0 refills | Status: AC

## 2019-02-25 MED ORDER — SILVER SULFADIAZINE 1 % EX CREA: 1.0000 "application " | TOPICAL_CREAM | Freq: Every day | CUTANEOUS | 0 refills | Status: DC

## 2019-02-25 MED ORDER — HYDROXYZINE HCL 25 MG PO TABS: 25.0000 mg | ORAL_TABLET | Freq: Four times a day (QID) | ORAL | 0 refills | Status: DC

## 2019-02-25 NOTE — ED Triage Notes (Signed)
Pt presents with rash he believes to be poison ivy on both arms, left neck area, and bottom of the right side of his abdomen.

## 2019-02-25 NOTE — ED Provider Notes (Signed)
[facility]    CSN: [id number] Arrival date & time: 02/25/19  1428      History   Chief Complaint Chief Complaint  Patient presents with  . Rash    HPI Kevin [name] is a 53 y.o. male with a history of diabetes mellitus type 2-controlled, hypertension-uncontrolled comes to urgent care with extensive rash over the forearms and torso after contact with poison ivy.  Contact was a couple of days ago.  Rash started soon after.  The rash is pruritic.  Rash is painful.  Pain is of moderate severity.  Is not radiating.  Is associated with burning.  Mild erythema of the skin.  No relieving factors.  Patient has tried sulfadiazine and Benadryl spray.  No fever or chills.   HPI  Past Medical History:  Diagnosis Date  . Diabetes mellitus without complication ([place])   . History of prediabetes   . Hypertension   . MRSA (methicillin resistant Staphylococcus aureus)     Patient Active Problem List   Diagnosis Date Noted  . Toe pain, chronic, left 10/07/2018  . Hyperlipidemia associated with type 2 diabetes mellitus ([place]) 09/19/2018  . Hyperlipidemia 03/18/2018  . Dyspnea on exertion 03/18/2018  . Chest pain 03/18/2018  . Diabetes mellitus without complication ([place]) 21/07/5519  . Essential hypertension 11/11/2017    Past Surgical History:  Procedure Laterality Date  . BACK SURGERY    . LAPAROSCOPIC GASTRIC BANDING         Home Medications    Prior to Admission medications   Medication Sig Start Date End Date Taking? Authorizing Provider  acetaminophen (TYLENOL) 500 MG tablet Take 3,000-4,000 mg by mouth every 4 (four) hours as needed for moderate pain, fever or headache.     [provider]  amLODipine (NORVASC) 5 MG tablet Take 1 tablet ('5mg'$ ) by mouth daily. Take extra 1/2 tablet (2.5 mg) for systolic greater than 802 Patient taking differently: Take 2.5-5 mg by mouth See admin instructions. Take 1 tablet (5 mg) by mouth daily. May take an extra 1/2 tablet (2.5  mg) for systolic greater than 233 04/08/18   Barrett, Evelene Croon, PA-C  atorvastatin (LIPITOR) 40 MG tablet Take 1 tablet (40 mg total) by mouth daily. 03/18/18   Lorretta Harp, MD  Blood Glucose Monitoring Suppl (TRUE METRIX METER) w/Device KIT Use as instructed 10/23/17   [name], NP  cyclobenzaprine (FLEXERIL) 10 MG tablet Take 1 tablet (10 mg total) by mouth 3 (three) times daily as needed for muscle spasms. 01/30/19   Hassell Done, Mary-Margaret, FNP  enalapril (VASOTEC) 20 MG tablet Take 2 tablets (40 mg total) by mouth daily. Needs office visit. 01/28/19   Charlott Rakes, MD  glimepiride (AMARYL) 2 MG tablet Take 1 tablet (2 mg total) by mouth daily before breakfast. 02/28/18   [name], NP  glucose blood (TRUE METRIX BLOOD GLUCOSE TEST) test strip Use as instructed 10/23/17   [name], NP  HYDROcodone-acetaminophen (NORCO/VICODIN) 5-325 MG tablet Take 1-2 tablets by mouth every 6 (six) hours as needed. 02/12/19   Loura Halt A, NP  hydrOXYzine (ATARAX/VISTARIL) 25 MG tablet Take 1 tablet (25 mg total) by mouth every 6 (six) hours. 02/25/19   Chase Picket, MD  omeprazole (PRILOSEC) 20 MG capsule Take 1 capsule (20 mg total) by mouth 2 (two) times daily before a meal. 02/28/18 02/12/19  [name], NP  pneumococcal 23 valent vaccine (PNEUMOVAX 23) 25 MCG/0.5ML injection To be administered by the Pharmacist  11/26/17   Tresa Garter, MD  predniSONE (DELTASONE) 10 MG tablet Take 2 tablets (20 mg total) by mouth daily for 5 days. 02/25/19 03/02/19  Chase Picket, MD  silver sulfADIAZINE (SILVADENE) 1 % cream Apply 1 application topically daily. 02/25/19   Ziggy Reveles, Myrene Galas, MD  sitaGLIPtin-metformin (JANUMET) 50-1000 MG tablet TAKE 1 TABLET BY MOUTH 2 (TWO) TIMES DAILY WITH A MEAL. Patient taking differently: Take 1 tablet by mouth 2 (two) times daily with a meal. TAKE 1 TABLET BY MOUTH 2 (TWO) TIMES DAILY WITH A MEAL. 09/19/18   [name], NP  TRUEPLUS LANCETS 28G  MISC Use as instructed 10/23/17   [name], NP    Family History Family History  Problem Relation Age of Onset  . Heart disease Mother   . Hypertension Mother   . Diabetes Sister   . Hypertension Sister   . Stroke Brother   . Diabetes Brother   . Heart disease Brother   . Heart attack Brother   . Hypertension Brother   . Hypertension Father     Social History Social History   Tobacco Use  . Smoking status: Former Smoker    Quit date: 2000    Years since quitting: 20.4  . Smokeless tobacco: Never Used  Substance Use Topics  . Alcohol use: No  . Drug use: No     Allergies   Patient has no known allergies.   Review of Systems Review of Systems  Constitutional: Positive for activity change. Negative for appetite change, chills, fatigue and fever.  HENT: Negative.   Respiratory: Negative.   Cardiovascular: Negative.   Gastrointestinal: Negative.   Genitourinary: Negative.   Musculoskeletal: Negative.   Skin: Positive for color change, rash and wound. Negative for pallor.  Neurological: Negative for dizziness, seizures, weakness and headaches.  All other systems reviewed and are negative.    Physical Exam Triage Vital Signs ED Triage Vitals  Enc Vitals Group     BP 02/25/19 1510 (!) 186/113     Pulse Rate 02/25/19 1510 (!) 116     Resp 02/25/19 1510 20     Temp 02/25/19 1510 98.4 F (36.9 C)     Temp Source 02/25/19 1510 Oral     SpO2 02/25/19 1510 100 %     Weight --      Height --      Head Circumference --      Peak Flow --      Pain Score 02/25/19 1511 7     Pain Loc --      Pain Edu? --      Excl. in [place]? --    No data found.  Updated Vital Signs BP (!) 186/113 (BP Location: Right Arm)   Pulse (!) 116   Temp 98.4 F (36.9 C) (Oral)   Resp 20   SpO2 100%   Visual Acuity Right Eye Distance:   Left Eye Distance:   Bilateral Distance:    Right Eye Near:   Left Eye Near:    Bilateral Near:     Physical Exam Constitutional:       General: He is in acute distress.     Appearance: He is not ill-appearing.  HENT:     Mouth/Throat:     Mouth: Mucous membranes are moist.     Pharynx: No oropharyngeal exudate or posterior oropharyngeal erythema.  Cardiovascular:     Rate and Rhythm: Normal rate and regular rhythm.  Pulses: Normal pulses.     Heart sounds: Normal heart sounds.  Pulmonary:     Effort: Pulmonary effort is normal.     Breath sounds: Normal breath sounds.  Abdominal:     General: Bowel sounds are normal.     Palpations: Abdomen is soft.  Musculoskeletal: Normal range of motion.        General: No swelling or deformity.  Skin:    Capillary Refill: Capillary refill takes less than 2 seconds.     Coloration: Skin is not jaundiced or pale.     Findings: Erythema, lesion and rash present.  Neurological:     General: No focal deficit present.     Mental Status: He is alert.      UC Treatments / Results  Labs (all labs ordered are listed, but only abnormal results are displayed) Labs Reviewed - No data to display  EKG None  Radiology No results found.  Procedures Procedures (including critical care time)  Medications Ordered in UC Medications - No data to display  Initial Impression / Assessment and Plan / UC Course  I have reviewed the triage vital signs and the nursing notes.  Pertinent labs & imaging results that were available during my care of the patient were reviewed by me and considered in my medical decision making (see chart for details).     1.  Rhus dermatitis: Short course of prednisone Silver sulfadiazine cream to be applied twice daily Hydroxyzine as needed for pruritus Patient is advised to return to urgent care if he develops worsening rash, discharge, fever. Prednisone may make the patient's blood sugars worse.  2.  Hypertension urgency secondary to pain/discomfort: Patient has no headaches, shortness of breath, numbness or tingling Patient is advised to  continue amlodipine Patient will continue Tylenol extra strength.  He cannot tolerate NSAIDs because of GI side effects. If patient develops any headaches, lack of improvement in blood pressure, chest pain or dizziness, she needs to return to urgent care to be reevaluated. Final Clinical Impressions(s) / UC Diagnoses   Final diagnoses:  Rhus dermatitis   Discharge Instructions   None    ED Prescriptions    Medication Sig Dispense Auth. Provider   predniSONE (DELTASONE) 10 MG tablet Take 2 tablets (20 mg total) by mouth daily for 5 days. 10 tablet Sakina Briones, Myrene Galas, MD   silver sulfADIAZINE (SILVADENE) 1 % cream Apply 1 application topically daily. 50 g Chase Picket, MD   hydrOXYzine (ATARAX/VISTARIL) 25 MG tablet Take 1 tablet (25 mg total) by mouth every 6 (six) hours. 30 tablet Makalya Nave, Myrene Galas, MD     Controlled Substance Prescriptions [place] Controlled Substance Registry consulted? No   Chase Picket, MD 02/26/19 [phone]

## 2019-03-03 ENCOUNTER — Other Ambulatory Visit: Payer: Self-pay | Admitting: Nurse Practitioner

## 2019-03-03 ENCOUNTER — Telehealth (HOSPITAL_BASED_OUTPATIENT_CLINIC_OR_DEPARTMENT_OTHER): Payer: Self-pay | Admitting: Nurse Practitioner

## 2019-03-03 ENCOUNTER — Encounter: Payer: Self-pay | Admitting: Nurse Practitioner

## 2019-03-03 DIAGNOSIS — I10 Essential (primary) hypertension: Secondary | ICD-10-CM

## 2019-03-03 MED ORDER — SILVER SULFADIAZINE 1 % EX CREA: 1.0000 "application " | TOPICAL_CREAM | Freq: Every day | CUTANEOUS | 0 refills | Status: DC

## 2019-03-03 MED ORDER — ENALAPRIL MALEATE 20 MG PO TABS: 40.0000 mg | ORAL_TABLET | Freq: Every day | ORAL | 1 refills | Status: DC

## 2019-03-03 NOTE — Progress Notes (Signed)
Virtual Visit via Telephone Note Due to national recommendations of social distancing due to COVID 19, telehealth visit is felt to be most appropriate for this patient at this time.  I discussed the limitations, risks, security and privacy concerns of performing an evaluation and management service by telephone and the availability of in person appointments. I also discussed with the patient that there may be a patient responsible charge related to this service. The patient expressed understanding and agreed to proceed.    I connected with Kevin [name] on 03/03/19  at   1:30 PM EDT  EDT by telephone and verified that I am speaking with the correct person using two identifiers.   Consent I discussed the limitations, risks, security and privacy concerns of performing an evaluation and management service by telephone and the availability of in person appointments. I also discussed with the patient that there may be a patient responsible charge related to this service. The patient expressed understanding and agreed to proceed.   Location of Patient: Private Residence   [facility]    Persons participating in Telemedicine visit: Bertram DenverZelda Fleming FNP-BC YY NorwoodBien CMA Draylon Nygard    History of Present Illness: Telemedicine visit for: BP check  He has been monitoring his blood pressure at home daily with average readings 110-120/70s. Taking amlodipine 5 mg and enalapril 40 mg daily as prescribed. Denies chest pain, shortness of breath, palpitations, lightheadedness, dizziness, headaches or BLE edema. Following low sodium diet. Stays active several times per week.     Past Medical History:  Diagnosis Date  . Diabetes mellitus without complication ([facility])   . History of prediabetes   . Hypertension   . MRSA (methicillin resistant Staphylococcus aureus)     Past Surgical History:  Procedure Laterality Date  . BACK SURGERY    . LAPAROSCOPIC  GASTRIC BANDING      Family History  Problem Relation Age of Onset  . Heart disease Mother   . Hypertension Mother   . Diabetes Sister   . Hypertension Sister   . Stroke Brother   . Diabetes Brother   . Heart disease Brother   . Heart attack Brother   . Hypertension Brother   . Hypertension Father     Social History   Socioeconomic History  . Marital status: Divorced    Spouse name: Not on file  . Number of children: Not on file  . Years of education: Not on file  . Highest education level: Not on file  Occupational History  . Not on file  Social Needs  . Financial resource strain: Not on file  . Food insecurity    Worry: Not on file    Inability: Not on file  . Transportation needs    Medical: Not on file    Non-medical: Not on file  Tobacco Use  . Smoking status: Former Smoker    Quit date: 2000    Years since quitting: 20.4  . Smokeless tobacco: Never Used  Substance and Sexual Activity  . Alcohol use: No  . Drug use: No  . Sexual activity: Yes  Lifestyle  . Physical activity    Days per week: Not on file    Minutes per session: Not on file  . Stress: Not on file  Relationships  . Social Musicianconnections    Talks on phone: Not on file    Gets together: Not on file    Attends religious service: Not on file  Active member of club or organization: Not on file    Attends meetings of clubs or organizations: Not on file    Relationship status: Not on file  Other Topics Concern  . Not on file  Social History Narrative  . Not on file     Observations/Objective: Awake, alert and oriented x 3   Review of Systems  Constitutional: Negative for fever, malaise/fatigue and weight loss.  HENT: Negative.  Negative for nosebleeds.   Eyes: Negative.  Negative for blurred vision, double vision and photophobia.  Respiratory: Negative.  Negative for cough and shortness of breath.   Cardiovascular: Negative.  Negative for chest pain, palpitations and leg swelling.   Gastrointestinal: Negative.  Negative for heartburn, nausea and vomiting.  Musculoskeletal: Negative.  Negative for myalgias.  Neurological: Negative.  Negative for dizziness, focal weakness, seizures and headaches.  Psychiatric/Behavioral: Negative.  Negative for suicidal ideas.    Assessment and Plan: Diagnoses and all orders for this visit:  Essential hypertension Continue all antihypertensives as prescribed.  Remember to bring in your blood pressure log with you for your follow up appointment.  DASH/Mediterranean Diets are healthier choices for HTN.      Follow Up Instructions Return for Fasting labs in 2 weeks. F/u office visit 3 months.     I discussed the assessment and treatment plan with the patient. The patient was provided an opportunity to ask questions and all were answered. The patient agreed with the plan and demonstrated an understanding of the instructions.   The patient was advised to call back or seek an in-person evaluation if the symptoms worsen or if the condition fails to improve as anticipated.  I provided 17 minutes of non-face-to-face time during this encounter including median intraservice time, reviewing previous notes, labs, imaging, medications and explaining diagnosis and management.  [name], FNP-BC

## 2019-03-11 ENCOUNTER — Ambulatory Visit: Payer: Self-pay | Attending: Nurse Practitioner

## 2019-03-11 ENCOUNTER — Other Ambulatory Visit: Payer: Self-pay

## 2019-03-11 DIAGNOSIS — I10 Essential (primary) hypertension: Secondary | ICD-10-CM

## 2019-03-12 ENCOUNTER — Encounter: Payer: Self-pay | Admitting: Nurse Practitioner

## 2019-03-12 LAB — HEMOGLOBIN A1C
Est. average glucose Bld gHb Est-mCnc: 148 mg/dL
Hgb A1c MFr Bld: 6.8 % — ABNORMAL HIGH (ref 4.8–5.6)

## 2019-03-12 LAB — CMP14+EGFR
ALT: 45 IU/L — ABNORMAL HIGH (ref 0–44)
AST: 28 IU/L (ref 0–40)
Albumin/Globulin Ratio: 1.8 (ref 1.2–2.2)
Albumin: 4.6 g/dL (ref 3.8–4.9)
Alkaline Phosphatase: 80 IU/L (ref 39–117)
BUN/Creatinine Ratio: 7 — ABNORMAL LOW (ref 9–20)
BUN: 7 mg/dL (ref 6–24)
Bilirubin Total: 0.3 mg/dL (ref 0.0–1.2)
CO2: 21 mmol/L (ref 20–29)
Calcium: 9.5 mg/dL (ref 8.7–10.2)
Chloride: 101 mmol/L (ref 96–106)
Creatinine, Ser: 1.06 mg/dL (ref 0.76–1.27)
GFR calc Af Amer: 92 mL/min/{1.73_m2} (ref >59)
GFR calc non Af Amer: 80 mL/min/{1.73_m2} (ref >59)
Globulin, Total: 2.6 g/dL (ref 1.5–4.5)
Glucose: 93 mg/dL (ref 65–99)
Potassium: 4.3 mmol/L (ref 3.5–5.2)
Sodium: 139 mmol/L (ref 134–144)
Total Protein: 7.2 g/dL (ref 6.0–8.5)

## 2019-03-12 LAB — LIPID PANEL
Chol/HDL Ratio: 3.4 ratio (ref 0.0–5.0)
Cholesterol, Total: 144 mg/dL (ref 100–199)
HDL: 42 mg/dL (ref >39)
LDL Calculated: 75 mg/dL (ref 0–99)
Triglycerides: 134 mg/dL (ref 0–149)
VLDL Cholesterol Cal: 27 mg/dL (ref 5–40)

## 2019-03-14 ENCOUNTER — Other Ambulatory Visit: Payer: Self-pay | Admitting: Nurse Practitioner

## 2019-03-14 DIAGNOSIS — E119 Type 2 diabetes mellitus without complications: Secondary | ICD-10-CM

## 2019-03-14 MED ORDER — GLIMEPIRIDE 4 MG PO TABS: 4.0000 mg | ORAL_TABLET | Freq: Every day | ORAL | 1 refills | Status: DC

## 2019-03-16 ENCOUNTER — Other Ambulatory Visit: Payer: Self-pay | Admitting: Cardiovascular Disease

## 2019-03-16 ENCOUNTER — Other Ambulatory Visit: Payer: Self-pay | Admitting: Nurse Practitioner

## 2019-03-16 DIAGNOSIS — K219 Gastro-esophageal reflux disease without esophagitis: Secondary | ICD-10-CM

## 2019-03-16 DIAGNOSIS — E1169 Type 2 diabetes mellitus with other specified complication: Secondary | ICD-10-CM

## 2019-03-16 MED FILL — ?ATORVASTATIN 40MG TABLET: 40 | 90 days supply | Qty: 90 | Fill #0

## 2019-03-16 MED FILL — GLIMEPIRIDE 4 MG TABS: 4 | 90 days supply | Qty: 90 | Fill #0

## 2019-03-16 MED FILL — ?AMLODIPINE BESYLATE 5MG TA: 5 | 26 days supply | Qty: 40 | Fill #8

## 2019-03-19 ENCOUNTER — Other Ambulatory Visit: Payer: Self-pay | Admitting: Nurse Practitioner

## 2019-03-19 DIAGNOSIS — K219 Gastro-esophageal reflux disease without esophagitis: Secondary | ICD-10-CM

## 2019-03-25 ENCOUNTER — Encounter: Payer: Self-pay | Admitting: Nurse Practitioner

## 2019-03-26 ENCOUNTER — Other Ambulatory Visit: Payer: Self-pay | Admitting: Nurse Practitioner

## 2019-03-26 DIAGNOSIS — Z1211 Encounter for screening for malignant neoplasm of colon: Secondary | ICD-10-CM

## 2019-03-26 DIAGNOSIS — K219 Gastro-esophageal reflux disease without esophagitis: Secondary | ICD-10-CM

## 2019-03-26 MED ORDER — OMEPRAZOLE 20 MG PO CPDR: 20.0000 mg | DELAYED_RELEASE_CAPSULE | Freq: Two times a day (BID) | ORAL | 3 refills | Status: DC

## 2019-03-26 NOTE — Telephone Encounter (Signed)
Omeprazole refill request, Patient has seen PCP recently and completed lab work with a follow up scheduled for September.

## 2019-03-27 MED FILL — ?OMEPRAZOLE 20 MG CAPSULE D: 20 | 30 days supply | Qty: 60 | Fill #0

## 2019-04-15 NOTE — Progress Notes (Signed)
Telehealth Video  Patient has given verbal permission to conduct this visit via virtual appointment and to bill insurance 04/16/2019 8 am     Date:  04/15/2019   ID:  Kevin [name], DOB [birth year], MRN [id number] Patient location: Home Provider location: Home office  PCP:  [name], NP  Cardiologist: Dr. Gwenlyn Found  Electrophysiologist:  None   Evaluation Performed: Follow-up  Chief Complaint: Follow-up blood pressure control  History of Present Illness:    Kevin [name] is a 53 y.o. male with we are following for ongoing assessment and management of hypertension, and HL, with other history of DM, and FH of CAD, He has chronic fatigue and DOE. He was last seen by [name], PA on 04/08/2018 with continued symptoms of dyspnea.   Echocardiogram on 03/26/2018 revealed EF of 62%-83%, grade 1 diastolic dysfunction. NM stress test 03/21/2018 was a low risk study As his symptoms continued consideration for cardiac CTA .  Blood pressure was not controlled, and therefore amlodipine 5 mg was added.   A CT angio of the chest, aorta and LE was completed on 10/20/2018 which revealed calcifications of the LAD and RCA. The bilateral tibial arteries are poorly opacified at the distal segments.   He followed up with PCP [name], FNP with complaints of musculoskeletal pain. He was placed on prednisone and flexeril. Follow up appointment was completed on 03/03/2019. He was feeling some better. BP was well controlled on lisinopril 40 mg and amlodipine 5 mg daily.   Mr. Maule is doing well.  He continues to have some mild hypertension related to stress, anxiety, and physician office visits.  He has a blood pressure cuff at home with blood pressures ranging between 151 and 761 systolic.  He is medically compliant.  He has recently seen his PCP for labs.  He occasionally has had some hypotension after being outside in the heat mowing the lawn.  After he finishes he feels lightheaded and he states his  blood pressure drops into the 60V systolic.  He recovers quickly however with cool temperatures and rest.  He has not had any recurrent chest discomfort.  The patient does not have symptoms concerning for COVID-19 infection (fever, chills, cough, or new shortness of breath).    Past Medical History:  Diagnosis Date  . Diabetes mellitus without complication ([place])   . History of prediabetes   . Hypertension   . MRSA (methicillin resistant Staphylococcus aureus)    Past Surgical History:  Procedure Laterality Date  . BACK SURGERY    . LAPAROSCOPIC GASTRIC BANDING       No outpatient medications have been marked as taking for the 04/16/19 encounter (Appointment) with [name], NP.     Allergies:   Patient has no known allergies.   Social History   Tobacco Use  . Smoking status: Former Smoker    Quit date: 2000    Years since quitting: 20.6  . Smokeless tobacco: Never Used  Substance Use Topics  . Alcohol use: No  . Drug use: No     Family Hx: The patient's family history includes Diabetes in his brother and sister; Heart attack in his brother; Heart disease in his brother and mother; Hypertension in his brother, father, mother, and sister; Stroke in his brother.  ROS:   Please see the history of present illness.    All other systems reviewed and are negative.   Prior CV studies:   The following studies were reviewed today: ECHO: 03/26/2018 -  Left ventricle: The cavity size was normal. Systolic function was normal. The estimated ejection fraction was in the range of 50% to 55%. Wall motion was normal; there were no regional wall motion abnormalities. Doppler parameters are consistent with abnormal left ventricular relaxation (grade 1 diastolic dysfunction). - Aortic valve: There was no regurgitation. - Mitral valve: There was no regurgitation. - Right ventricle: Systolic function was normal. - Atrial septum: No defect or patent foramen ovale was  identified. - Tricuspid valve: There was no significant regurgitation. - Pulmonic valve: There was trivial regurgitation. Impressions: - Normal LV systolic function without wall motion abnormalities. No significant valvular disease.  MYOVIEW: 03/21/2018  Nuclear stress EF: 49%.  The left ventricular ejection fraction is mildly decreased (45-54%).  The study is normal.  This is a low risk study.  There was no ST segment deviation noted during stress.  No T wave inversion was noted during stress.  Low risk stress nuclear study with normal perfusion and mildly reduced left ventricular systolic function. Visually estimated left ventricular systolic function appears normal. Consider comparison with echocardiography  Labs/Other Tests and Data Reviewed:    EKG: Not completed video visit.   Recent Labs: 09/19/2018: Hemoglobin 14.3; Platelets 267 03/11/2019: ALT 45; BUN 7; Creatinine, Ser 1.06; Potassium 4.3; Sodium 139   Recent Lipid Panel Lab Results  Component Value Date/Time   CHOL 144 03/11/2019 09:38 AM   TRIG 134 03/11/2019 09:38 AM   HDL 42 03/11/2019 09:38 AM   CHOLHDL 3.4 03/11/2019 09:38 AM   LDLCALC 75 03/11/2019 09:38 AM    Wt Readings from Last 3 Encounters:  10/21/18 197 lb (89.4 kg)  10/07/18 190 lb (86.2 kg)  09/19/18 190 lb (86.2 kg)     Objective:    Vital Signs:  There were no vitals taken for this visit.  General: Awake alert oriented no acute distress HEENT: Normal Respirations: Normal inspiratory expiratory breathing, no wheezing auscultated over video encounter, no coughing. Musculoskeletal: Normal, no deficiency in ROM Neuro: No focal deficits noted Psych: Slightly anxious, very talkative.  Good affect.  ASSESSMENT & PLAN:    1.  Hypertension: Currently well controlled.  The patient takes his blood pressure each day.  He has had some incidents where he has had hypotension after mowing the lawn in the heat.  I have advised him to drink  fluids prior to mowing the lawn, after 30 minutes drink more fluids (Gatorade or Powerade), and to drink more after he finishes.  This may help with decrease blood pressure symptoms.  He is given refills on amlodipine 5 mg daily with 90-day supply.  2.  Coronary artery disease: CT Angie of the chest and aorta on 10/20/2018 revealed calcifications of the LAD and RCA.  He is asymptomatic at this time.  Will likely need to have follow-up testing periodically to evaluate for progression of CAD.  For now we will continue secondary prevention.  3.  Hypercholesterolemia: Patient continues on atorvastatin 40 mg daily.  Most recent LDL on 03/10/2022 PCP was 75, with total cholesterol 144.  No changes in his current medication regimen.  COVID-19 Education: The signs and symptoms of COVID-19 were discussed with the patient and how to seek care for testing (follow up with PCP or arrange E-visit).  The importance of social distancing was discussed today.  Time:   Today, I have spent 15  minutes with the patient with telehealth technology discussing the above problems.     Medication Adjustments/Labs and Tests Ordered: Current medicines  are reviewed at length with the patient today.  Concerns regarding medicines are outlined above.   Tests Ordered: No orders of the defined types were placed in this encounter.   Medication Changes: No orders of the defined types were placed in this encounter.   Disposition:  Follow up 6 months with Theodore Demarkhonda Barrett, PA (pateint request).  Signed, Bettey MareKathryn M. Liborio NixonLawrence DNP, ANP, AACC  04/15/2019 9:30 AM    [facility]

## 2019-04-16 ENCOUNTER — Telehealth (INDEPENDENT_AMBULATORY_CARE_PROVIDER_SITE_OTHER): Payer: Self-pay | Admitting: Adult Health

## 2019-04-16 ENCOUNTER — Encounter: Payer: Self-pay | Admitting: Adult Health

## 2019-04-16 VITALS — BP 133/84 | HR 96 | Ht 67.0 in | Wt 182.0 lb

## 2019-04-16 DIAGNOSIS — I2510 Atherosclerotic heart disease of native coronary artery without angina pectoris: Secondary | ICD-10-CM

## 2019-04-16 DIAGNOSIS — E7800 Pure hypercholesterolemia, unspecified: Secondary | ICD-10-CM

## 2019-04-16 DIAGNOSIS — I10 Essential (primary) hypertension: Secondary | ICD-10-CM

## 2019-04-16 MED ORDER — AMLODIPINE BESYLATE 5 MG PO TABS: 2.5000 mg | ORAL_TABLET | ORAL | 3 refills | Status: DC

## 2019-04-16 NOTE — Patient Instructions (Signed)
Medication Instructions:  Continue current medications  If you need a refill on your cardiac medications before your next appointment, please call your pharmacy.  Labwork: None Ordered   Testing/Procedures: None Ordered  Follow-Up: . Your physician recommends that you schedule a follow-up appointment in: 6 Months with [name]   At [facility], you and your health needs are our priority.  As part of our continuing mission to provide you with exceptional heart care, we have created designated Provider Care Teams.  These Care Teams include your primary Cardiologist (physician) and Advanced Practice Providers (APPs -  Physician Assistants and Nurse Practitioners) who all work together to provide you with the care you need, when you need it.  Thank you for choosing [facility] at [facility]!!

## 2019-04-20 MED ORDER — AMLODIPINE BESYLATE 5 MG PO TABS: 5.0000 mg | ORAL_TABLET | Freq: Every day | ORAL | 3 refills | Status: DC

## 2019-04-20 MED FILL — ?AMLODIPINE BESYLATE 5MG TA: 5 | 30 days supply | Qty: 45 | Fill #0

## 2019-04-28 MED FILL — ?OMEPRAZOLE 20MG CAP DR: 20 | 90 days supply | Qty: 180 | Fill #1

## 2019-05-19 ENCOUNTER — Ambulatory Visit: Payer: Self-pay | Attending: Family Medicine

## 2019-05-19 ENCOUNTER — Other Ambulatory Visit: Payer: Self-pay

## 2019-05-25 MED FILL — ?AMLODIPINE BESYLATE 5MG TA: 5 | 30 days supply | Qty: 45 | Fill #1

## 2019-06-05 ENCOUNTER — Other Ambulatory Visit: Payer: Self-pay

## 2019-06-05 ENCOUNTER — Ambulatory Visit: Payer: Self-pay | Attending: Nurse Practitioner | Admitting: Nurse Practitioner

## 2019-06-05 ENCOUNTER — Encounter: Payer: Self-pay | Admitting: Nurse Practitioner

## 2019-06-05 VITALS — BP 150/98 | HR 96 | Temp 98.5°F | Ht 67.0 in | Wt 200.2 lb

## 2019-06-05 DIAGNOSIS — E119 Type 2 diabetes mellitus without complications: Secondary | ICD-10-CM

## 2019-06-05 DIAGNOSIS — I10 Essential (primary) hypertension: Secondary | ICD-10-CM

## 2019-06-05 DIAGNOSIS — Z1211 Encounter for screening for malignant neoplasm of colon: Secondary | ICD-10-CM

## 2019-06-05 DIAGNOSIS — Z125 Encounter for screening for malignant neoplasm of prostate: Secondary | ICD-10-CM

## 2019-06-05 DIAGNOSIS — E785 Hyperlipidemia, unspecified: Secondary | ICD-10-CM

## 2019-06-05 LAB — GLUCOSE, POCT (MANUAL RESULT ENTRY): POC Glucose: 129 mg/dl — AB (ref 70–99)

## 2019-06-05 LAB — POCT GLYCOSYLATED HEMOGLOBIN (HGB A1C): Hemoglobin A1C: 6.3 % — AB (ref 4.0–5.6)

## 2019-06-05 MED ORDER — ENALAPRIL MALEATE 20 MG PO TABS: 40.0000 mg | ORAL_TABLET | Freq: Every day | ORAL | 1 refills | Status: DC

## 2019-06-05 MED ORDER — JANUMET 50-1000 MG PO TABS: ORAL_TABLET | ORAL | 1 refills | Status: DC

## 2019-06-05 MED FILL — !JANUMET 50-1000MG TABLET: 50-1000 | 30 days supply | Qty: 60 | Fill #0

## 2019-06-05 MED FILL — ENALAPRIL MALEATE 20 MG TAB: 20 | 90 days supply | Qty: 180 | Fill #0

## 2019-06-05 NOTE — Progress Notes (Signed)
Assessment & Plan:  Kevin [name] was seen today for follow-up.  Diagnoses and all orders for this visit:  Diabetes mellitus without complication (HCC) -     Glucose (CBG) -     HgB A1c -     Ambulatory referral to Ophthalmology -     sitaGLIPtin-metformin (JANUMET) 50-1000 MG tablet; TAKE 1 TABLET BY MOUTH 2 (TWO) TIMES DAILY WITH A MEAL. Please fill as 90 day supply Continue blood sugar control as discussed in office today, low carbohydrate diet, and regular physical exercise as tolerated, 150 minutes per week (30 min each day, 5 days per week, or 50 min 3 days per week). Keep blood sugar logs with fasting goal of 90-130 mg/dl, post prandial (after you eat) less than 180.  For Hypoglycemia: BS <60 and Hyperglycemia BS >400; contact the clinic ASAP. Annual eye exams and foot exams are recommended.   Dyslipidemia, goal LDL below 70 Lab Results  Component Value Date   LDLCALC 75 03/11/2019  Not at goal. Continue atorvastatin 66m daily as prescribed.  INSTRUCTIONS: Work on a low fat, heart healthy diet and participate in regular aerobic exercise program by working out at least 150 minutes per week; 5 days a week-30 minutes per day. Avoid red meat, fried foods. junk foods, sodas, sugary drinks, unhealthy snacking, alcohol and smoking.  Drink at least 48oz of water per day and monitor your carbohydrate intake daily.    Essential hypertension. -     enalapril (VASOTEC) 20 MG tablet; Take 2 tablets (40 mg total) by mouth daily. Please fill as 90 day supply -     Basic Metabolic Panel Continue all antihypertensives as prescribed.  Remember to bring in your blood pressure log with you for your follow up appointment.  DASH/Mediterranean Diets are healthier choices for HTN.    Colon cancer screening -     Fecal occult blood, imunochemical([facility])  Prostate cancer screening -     PSA     Patient has been counseled on age-appropriate routine health concerns for screening and  prevention. These are reviewed and up-to-date. Referrals have been placed accordingly. Immunizations are up-to-date or declined.    Subjective:   Chief Complaint  Patient presents with  . Follow-up    Pt. is here for diabetes follow up.    HPI Kevin [name] 53y.o. male presents to office today for follow up of HTN and DM.   has a past medical history of Diabetes mellitus without complication ([place], History of prediabetes, Hypertension, and MRSA (methicillin resistant Staphylococcus aureus).  Essential Hypertension Monitoring his blood pressure at home with reading 131/94 this morning. Usually 110/80-90s. Denies chest pain, shortness of breath, palpitations, lightheadedness, dizziness, headaches or BLE edema.  Taking enalapril 40 mg daily and amlodipine 5 mg daily as prescribed.  BP Readings from Last 3 Encounters:  06/05/19 (!) 150/98  04/16/19 133/84  02/25/19 (!) 186/113    DM TYPE 2 Well controlled. Monitoring blood glucose levels infrequently. Difficult exercising due to chronic back pain and COVID. States too many people out at the parks as well.  Fasting average: 120-130s. Post prandial:140s.  He hypo-or hyperglycemic symptoms.  Overdue for eye exam and referral has been placed today. Lab Results  Component Value Date   HGBA1C 6.3 (A) 06/05/2019    Review of Systems  Constitutional: Negative for fever, malaise/fatigue and weight loss.  HENT: Negative.  Negative for nosebleeds.   Eyes: Negative.  Negative for blurred vision, double vision and photophobia.  Respiratory: Negative.  Negative for cough and shortness of breath.   Cardiovascular: Negative.  Negative for chest pain, palpitations and leg swelling.  Gastrointestinal: Negative.  Negative for heartburn, nausea and vomiting.  Musculoskeletal: Positive for back pain. Negative for myalgias.  Neurological: Negative.  Negative for dizziness, focal weakness, seizures and headaches.  Psychiatric/Behavioral: Negative.  Negative  for suicidal ideas.    Past Medical History:  Diagnosis Date  . Diabetes mellitus without complication ([place])   . History of prediabetes   . Hypertension   . MRSA (methicillin resistant Staphylococcus aureus)     Past Surgical History:  Procedure Laterality Date  . BACK SURGERY    . LAPAROSCOPIC GASTRIC BANDING      Family History  Problem Relation Age of Onset  . Heart disease Mother   . Hypertension Mother   . Diabetes Sister   . Hypertension Sister   . Stroke Brother   . Diabetes Brother   . Heart disease Brother   . Heart attack Brother   . Hypertension Brother   . Hypertension Father     Social History Reviewed with no changes to be made today.   Outpatient Medications Prior to Visit  Medication Sig Dispense Refill  . acetaminophen (TYLENOL) 500 MG tablet Take 3,000-4,000 mg by mouth every 4 (four) hours as needed for moderate pain, fever or headache.     Marland Kitchen amLODipine (NORVASC) 5 MG tablet Take 1 tablet (5 mg total) by mouth daily. May take an extra 1/2 tablet (2.5 mg) for systolic greater than 734 90 tablet 3  . atorvastatin (LIPITOR) 40 MG tablet TAKE 1 TABLET BY MOUTH DAILY. 90 tablet 3  . Blood Glucose Monitoring Suppl (TRUE METRIX METER) w/Device KIT Use as instructed 1 kit 0  . glimepiride (AMARYL) 4 MG tablet Take 1 tablet (4 mg total) by mouth daily before breakfast. 90 tablet 1  . glucose blood (TRUE METRIX BLOOD GLUCOSE TEST) test strip Use as instructed 100 each 12  . omeprazole (PRILOSEC) 20 MG capsule Take 1 capsule (20 mg total) by mouth 2 (two) times daily before a meal. 180 capsule 3  . silver sulfADIAZINE (SILVADENE) 1 % cream Apply 1 application topically daily. 50 g 0  . TRUEPLUS LANCETS 28G MISC Use as instructed 100 each 3  . sitaGLIPtin-metformin (JANUMET) 50-1000 MG tablet TAKE 1 TABLET BY MOUTH 2 (TWO) TIMES DAILY WITH A MEAL. (Patient taking differently: Take 1 tablet by mouth 2 (two) times daily with a meal. TAKE 1 TABLET BY MOUTH 2 (TWO)  TIMES DAILY WITH A MEAL.) 180 tablet 0  . pneumococcal 23 valent vaccine (PNEUMOVAX 23) 25 MCG/0.5ML injection To be administered by the Pharmacist (Patient not taking: Reported on 06/05/2019) 0.5 mL 0  . enalapril (VASOTEC) 20 MG tablet Take 2 tablets (40 mg total) by mouth daily. Needs office visit. 180 tablet 1   No facility-administered medications prior to visit.     No Known Allergies     Objective:    BP (!) 150/98 (BP Location: Left Arm, Patient Position: Sitting, Cuff Size: Normal)   Pulse 96   Temp 98.5 F (36.9 C) (Oral)   Ht 5' 7"  (1.702 m)   Wt 200 lb 3.2 oz (90.8 kg)   SpO2 96%   BMI 31.36 kg/m  Wt Readings from Last 3 Encounters:  06/05/19 200 lb 3.2 oz (90.8 kg)  04/16/19 182 lb (82.6 kg)  10/21/18 197 lb (89.4 kg)    Physical Exam Vitals signs and nursing note reviewed.  Constitutional:      Appearance: He is well-developed.  HENT:     Head: Normocephalic and atraumatic.  Neck:     Musculoskeletal: Normal range of motion.  Cardiovascular:     Rate and Rhythm: Normal rate and regular rhythm.     Pulses:          Dorsalis pedis pulses are 2+ on the right side and 2+ on the left side.       Posterior tibial pulses are 2+ on the right side and 2+ on the left side.     Heart sounds: Normal heart sounds. No murmur. No friction rub. No gallop.   Pulmonary:     Effort: Pulmonary effort is normal. No tachypnea or respiratory distress.     Breath sounds: Normal breath sounds. No decreased breath sounds, wheezing, rhonchi or rales.  Chest:     Chest wall: No tenderness.  Abdominal:     General: Bowel sounds are normal.     Palpations: Abdomen is soft.  Musculoskeletal: Normal range of motion.  Feet:     Right foot:     Protective Sensation: 10 sites tested. 10 sites sensed.     Left foot:     Protective Sensation: 10 sites tested. 8 sites sensed.  Skin:    General: Skin is warm and dry.  Neurological:     Mental Status: He is alert and oriented to  person, place, and time.     Coordination: Coordination normal.  Psychiatric:        Behavior: Behavior normal. Behavior is cooperative.        Thought Content: Thought content normal.        Judgment: Judgment normal.        Patient has been counseled extensively about nutrition and exercise as well as the importance of adherence with medications and regular follow-up. The patient was given clear instructions to go to ER or return to medical center if symptoms don't improve, worsen or new problems develop. The patient verbalized understanding.   Follow-up: Return in about 3 months (around 09/04/2019) for HTN/HPL/DM.   [name], FNP-BC [facility] and [place], [place]   06/05/2019, 12:45 PM

## 2019-06-06 LAB — BASIC METABOLIC PANEL
BUN/Creatinine Ratio: 11 (ref 9–20)
BUN: 11 mg/dL (ref 6–24)
CO2: 22 mmol/L (ref 20–29)
Calcium: 9.7 mg/dL (ref 8.7–10.2)
Chloride: 100 mmol/L (ref 96–106)
Creatinine, Ser: 1.03 mg/dL (ref 0.76–1.27)
GFR calc Af Amer: 95 mL/min/{1.73_m2} (ref >59)
GFR calc non Af Amer: 83 mL/min/{1.73_m2} (ref >59)
Glucose: 153 mg/dL — ABNORMAL HIGH (ref 65–99)
Potassium: 4.6 mmol/L (ref 3.5–5.2)
Sodium: 138 mmol/L (ref 134–144)

## 2019-06-06 LAB — PSA: Prostate Specific Ag, Serum: 0.3 ng/mL (ref 0.0–4.0)

## 2019-06-12 LAB — FECAL OCCULT BLOOD, IMMUNOCHEMICAL: Fecal Occult Bld: NEGATIVE

## 2019-06-15 MED FILL — GLIMEPIRIDE 4 MG TABS: 4 | 90 days supply | Qty: 90 | Fill #1

## 2019-06-15 MED FILL — ?ATORVASTATIN 40MG TABLET: 40 | 90 days supply | Qty: 90 | Fill #1

## 2019-06-19 MED FILL — ?AMLODIPINE BESYLATE 5MG TA: 5 | 30 days supply | Qty: 45 | Fill #2

## 2019-07-06 MED FILL — !JANUMET 50-1000MG TABLET: 50-1000 | 30 days supply | Qty: 60 | Fill #1

## 2019-07-23 MED FILL — ?OMEPRAZOLE 20MG CAP DR: 20 | 90 days supply | Qty: 180 | Fill #2

## 2019-07-27 MED FILL — ?AMLODIPINE BESYLATE 5 MG T: 5 MG | 30 days supply | Qty: 45 | Fill #3

## 2019-08-10 MED FILL — !JANUMET 50-1000MG TABLET: 50-1000 | 30 days supply | Qty: 60 | Fill #2

## 2019-08-25 MED FILL — AMLODIPINE BESYLATE 5 MG TA: 5 | 30 days supply | Qty: 45 | Fill #4

## 2019-09-07 ENCOUNTER — Telehealth (HOSPITAL_BASED_OUTPATIENT_CLINIC_OR_DEPARTMENT_OTHER): Payer: Self-pay | Admitting: Nurse Practitioner

## 2019-09-07 ENCOUNTER — Encounter: Payer: Self-pay | Admitting: Nurse Practitioner

## 2019-09-07 DIAGNOSIS — K219 Gastro-esophageal reflux disease without esophagitis: Secondary | ICD-10-CM

## 2019-09-07 DIAGNOSIS — E1169 Type 2 diabetes mellitus with other specified complication: Secondary | ICD-10-CM

## 2019-09-07 DIAGNOSIS — E785 Hyperlipidemia, unspecified: Secondary | ICD-10-CM

## 2019-09-07 DIAGNOSIS — E118 Type 2 diabetes mellitus with unspecified complications: Secondary | ICD-10-CM

## 2019-09-07 DIAGNOSIS — I10 Essential (primary) hypertension: Secondary | ICD-10-CM

## 2019-09-07 MED ORDER — GLIMEPIRIDE 4 MG PO TABS: 4.0000 mg | ORAL_TABLET | Freq: Every day | ORAL | 1 refills | Status: DC

## 2019-09-07 MED ORDER — OMEPRAZOLE 20 MG PO CPDR: 20.0000 mg | DELAYED_RELEASE_CAPSULE | Freq: Two times a day (BID) | ORAL | 3 refills | Status: DC

## 2019-09-07 MED FILL — ENALAPRIL MALEATE 20 MG TAB: 20 | 90 days supply | Qty: 180 | Fill #1

## 2019-09-07 MED FILL — GLIMEPIRIDE 4 MG TABS: 4 | 90 days supply | Qty: 90 | Fill #0

## 2019-09-07 NOTE — Progress Notes (Signed)
Assessment & Plan:  Diagnoses and all orders for this visit:  Essential hypertension Continue all antihypertensives as prescribed.  Remember to bring in your blood pressure log with you for your follow up appointment.  DASH/Mediterranean Diets are healthier choices for HTN.   Type 2 diabetes mellitus with complication, without long-term current use of insulin (HCC) -     glimepiride (AMARYL) 4 MG tablet; Take 1 tablet (4 mg total) by mouth daily before breakfast. Please fill as a 90 day supply Continue blood sugar control as discussed in office today, low carbohydrate diet, and regular physical exercise as tolerated, 150 minutes per week (30 min each day, 5 days per week, or 50 min 3 days per week). Keep blood sugar logs with fasting goal of 90-130 mg/dl, post prandial (after you eat) less than 180.  For Hypoglycemia: BS <60 and Hyperglycemia BS >400; contact the clinic ASAP. Annual eye exams and foot exams are recommended.  Hyperlipidemia associated with type 2 diabetes mellitus ([place]) INSTRUCTIONS: Work on a low fat, heart healthy diet and participate in regular aerobic exercise program by working out at least 150 minutes per week; 5 days a week-30 minutes per day. Avoid red meat/beef/steak,  fried foods. junk foods, sodas, sugary drinks, unhealthy snacking, alcohol and smoking.  Drink at least 80 oz of water per day and monitor your carbohydrate intake daily.    Gastroesophageal reflux disease without esophagitis -     omeprazole (PRILOSEC) 20 MG capsule; Take 1 capsule (20 mg total) by mouth 2 (two) times daily before a meal. Please fill as a 90 day supply INSTRUCTIONS: Avoid GERD Triggers: acidic, spicy or fried foods, caffeine, coffee, sodas,  alcohol and chocolate.    Patient has been counseled on age-appropriate routine health concerns for screening and prevention. These are reviewed and up-to-date. Referrals have been placed accordingly. Immunizations are up-to-date or declined.     Subjective:   HPI Kevin [name] 53 y.o. male presents for telehealth visit today to follow-up: DM, HTN and HPL.  Telehealth visit was performed today per patient request secondary to [place] pandemic and wanting to stay at home.  He reports falling up the stairs recently. Feels he may have broken one of his fingers.  Wants to just continue to monitor for now.      Essential Hypertension Monitoring blood pressure at home with last reading 108/92.  States blood pressure goes up and down depending on his stress levels.  Endorses medication compliance taking amlodipine 5 mg daily and enalapril 40 mg daily. Denies chest pain, shortness of breath, palpitations, lightheadedness, dizziness, headaches or BLE edema.   DM TYPE 2 Fasting reading this morning in the 90s.  States blood glucose levels do not run higher than the 130s fasting or postprandial.  He is monitoring his blood glucose levels twice a day and endorses medication compliance taking glimepiride 4 mg daily and Janumet 50-1000 mg twice daily.  Denies any hypo or hyperglycemic symptoms. Lab Results  Component Value Date   HGBA1C 6.3 (A) 06/05/2019    Dyslipidemia LDL nearing goal of less than 70.  He endorses medication compliance taking atorvastatin 40 mg daily.  Denies any statin intolerance including myalgias. Lab Results  Component Value Date   LDLCALC 75 03/11/2019   Review of Systems  Constitutional: Negative for fever, malaise/fatigue and weight loss.  HENT: Negative.  Negative for nosebleeds.   Eyes: Negative.  Negative for blurred vision, double vision and photophobia.  Respiratory: Negative.  Negative for cough  and shortness of breath.   Cardiovascular: Negative.  Negative for chest pain, palpitations and leg swelling.  Gastrointestinal: Positive for heartburn. Negative for nausea and vomiting.  Musculoskeletal: Positive for joint pain (SEE HPI). Negative for myalgias.  Neurological: Negative.  Negative for dizziness, focal  weakness, seizures and headaches.  Psychiatric/Behavioral: Negative.  Negative for suicidal ideas.    Past Medical History:  Diagnosis Date  . Diabetes mellitus without complication ([place])   . History of prediabetes   . Hypertension   . MRSA (methicillin resistant Staphylococcus aureus)     Past Surgical History:  Procedure Laterality Date  . BACK SURGERY    . LAPAROSCOPIC GASTRIC BANDING      Family History  Problem Relation Age of Onset  . Heart disease Mother   . Hypertension Mother   . Diabetes Sister   . Hypertension Sister   . Stroke Brother   . Diabetes Brother   . Heart disease Brother   . Heart attack Brother   . Hypertension Brother   . Hypertension Father     Social History Reviewed with no changes to be made today.   Outpatient Medications Prior to Visit  Medication Sig Dispense Refill  . acetaminophen (TYLENOL) 500 MG tablet Take 3,000-4,000 mg by mouth every 4 (four) hours as needed for moderate pain, fever or headache.     Marland Kitchen amLODipine (NORVASC) 5 MG tablet Take 1 tablet (5 mg total) by mouth daily. May take an extra 1/2 tablet (2.5 mg) for systolic greater than 263 90 tablet 3  . atorvastatin (LIPITOR) 40 MG tablet TAKE 1 TABLET BY MOUTH DAILY. 90 tablet 3  . Blood Glucose Monitoring Suppl (TRUE METRIX METER) w/Device KIT Use as instructed 1 kit 0  . enalapril (VASOTEC) 20 MG tablet Take 2 tablets (40 mg total) by mouth daily. Please fill as 90 day supply 180 tablet 1  . glucose blood (TRUE METRIX BLOOD GLUCOSE TEST) test strip Use as instructed 100 each 12  . pneumococcal 23 valent vaccine (PNEUMOVAX 23) 25 MCG/0.5ML injection To be administered by the Pharmacist (Patient not taking: Reported on 06/05/2019) 0.5 mL 0  . silver sulfADIAZINE (SILVADENE) 1 % cream Apply 1 application topically daily. 50 g 0  . sitaGLIPtin-metformin (JANUMET) 50-1000 MG tablet TAKE 1 TABLET BY MOUTH 2 (TWO) TIMES DAILY WITH A MEAL. Please fill as 90 day supply 180 tablet 1  .  TRUEPLUS LANCETS 28G MISC Use as instructed 100 each 3  . glimepiride (AMARYL) 4 MG tablet Take 1 tablet (4 mg total) by mouth daily before breakfast. 90 tablet 1  . omeprazole (PRILOSEC) 20 MG capsule Take 1 capsule (20 mg total) by mouth 2 (two) times daily before a meal. 180 capsule 3   No facility-administered medications prior to visit.    No Known Allergies     Objective:    There were no vitals taken for this visit. Wt Readings from Last 3 Encounters:  06/05/19 200 lb 3.2 oz (90.8 kg)  04/16/19 182 lb (82.6 kg)  10/21/18 197 lb (89.4 kg)    Physical Exam Constitutional:      Appearance: Normal appearance.  HENT:     Head: Normocephalic.  Pulmonary:     Effort: Pulmonary effort is normal.  Musculoskeletal:     Cervical back: Normal range of motion.  Neurological:     Mental Status: He is alert.  Psychiatric:        Mood and Affect: Mood normal.  Behavior: Behavior normal.        Thought Content: Thought content normal.        Judgment: Judgment normal.          Patient has been counseled extensively about nutrition and exercise as well as the importance of adherence with medications and regular follow-up. The patient was given clear instructions to go to ER or return to medical center if symptoms don't improve, worsen or new problems develop. The patient verbalized understanding.   Follow-up: No follow-ups on file.   [name], FNP-BC [facility] and [facility] [place], [place]   09/07/2019, 9:16 AM

## 2019-09-14 MED FILL — ?ATORVASTATIN 40MG TABLET: 40 | 90 days supply | Qty: 90 | Fill #2

## 2019-09-28 IMAGING — CT CT ANGIO CHEST
2 of 11 series · 9 of 29 positions shown · IV contrast (iopamidol)
Comparison: None.

CLINICAL DATA: 52-year-old male with a history blue toe syndrome
and painful ambulation

EXAM:
CT ANGIOGRAPHY OF CHEST, ABDOMEN, PELVIS WITH ILIOFEMORAL RUNOFF
TECHNIQUE: Multidetector CT imaging of the chest, abdomen, pelvis and lower
extremities was performed using the standard protocol during bolus
administration of intravenous contrast. Multiplanar CT image
reconstructions and MIPs were obtained to evaluate the vascular
anatomy.
CONTRAST:  100mL U9OEDK-XAN IOPAMIDOL (U9OEDK-XAN) INJECTION 76%

[Series 5: cta chest ao-bifem 2.00 bv36 s3 axial 2 mm · axial · 0.92mm/px · z∈[+714,+1810]mm · 7 of 768 slices shown]
[im 110/768  lung]
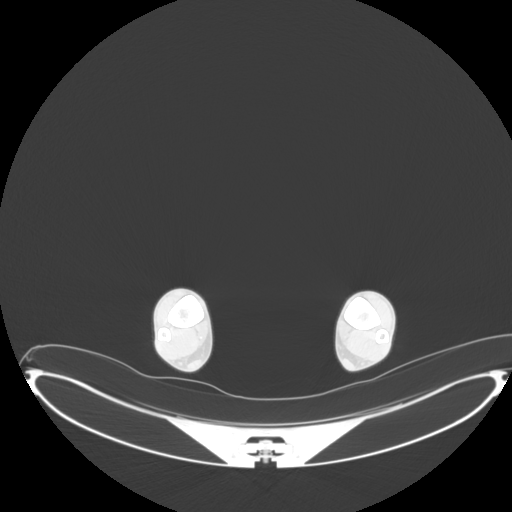
[im 220/768  mediastinal]
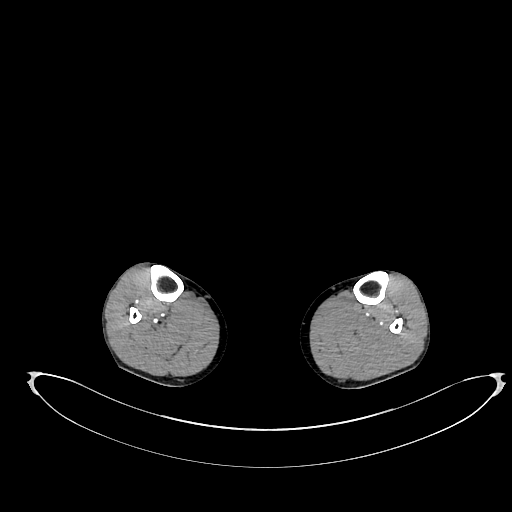
[im 329/768  lung]
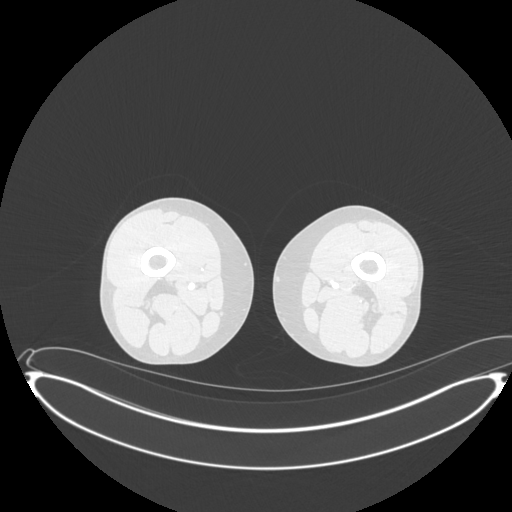
[im 383/768  mediastinal]
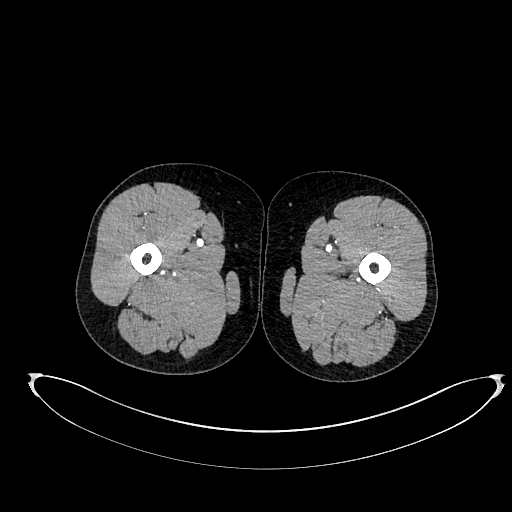
[im 439/768  lung]
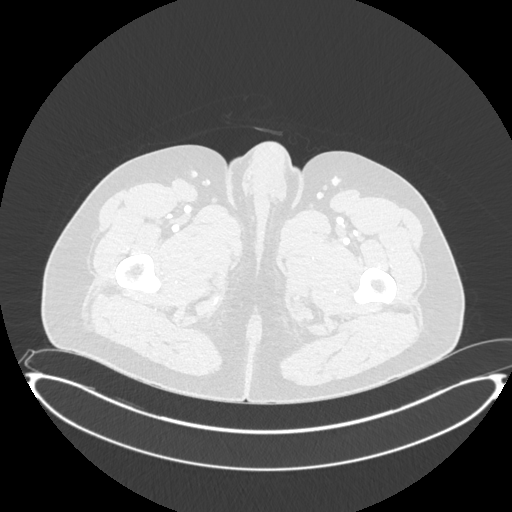
[im 548/768  mediastinal]
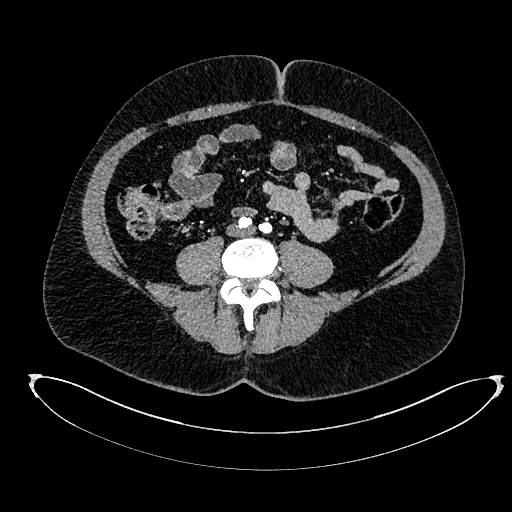
[im 658/768  lung]
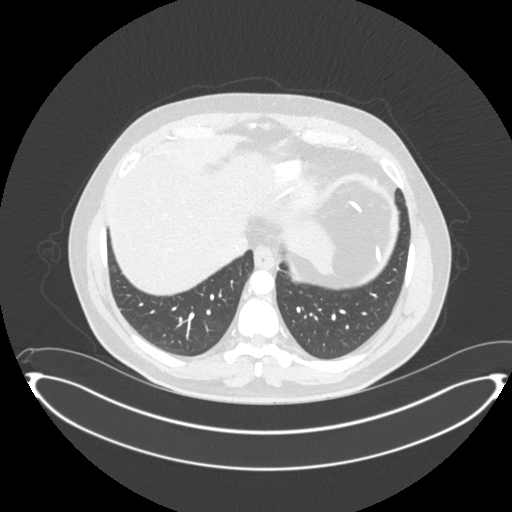

[Series 25: cta chest ao-bifem 1.00 br40 s3 super d · axial · 0.78mm/px · z∈[+1807,+1919]mm · 2 of 336 slices shown]
[im 112/336  lung]
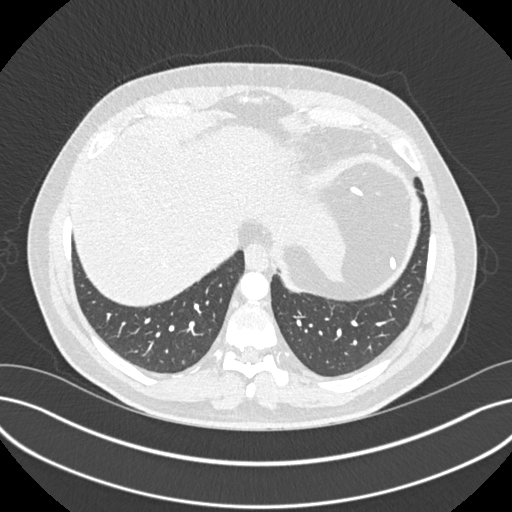
[im 224/336  lung]
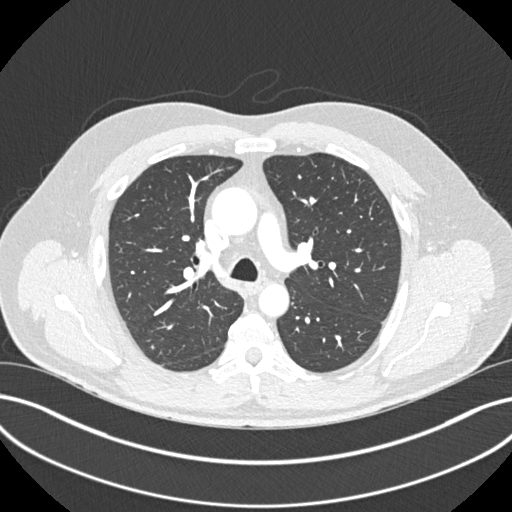

[9 of 29 positions shown; findings below may reference images not displayed]

FINDINGS: Chest

Cardiovascular:

Heart:

No cardiomegaly. No pericardial fluid/thickening. Calcifications of
left anterior descending coronary artery and right coronary artery.

Aorta:

Unremarkable course, caliber, contour of the thoracic aorta. No
aneurysm or dissection flap. No periaortic fluid. Minimal
atherosclerosis.

Pulmonary arteries:

No central, lobar, segmental, or proximal subsegmental filling
defects.

Mediastinum/Nodes: No mediastinal adenopathy. Unremarkable
appearance of the thoracic esophagus.

Unremarkable appearance of the thoracic inlet and thyroid.

Lungs/Pleura: Central airways are clear. No pleural effusion. No
confluent airspace disease.

No pneumothorax. 4 mm nodule at the periphery of the left lung
(image 39 of series 23).

Musculoskeletal: No acute displaced fracture. Degenerative changes
of the spine.

Abdomen/pelvis

VASCULAR

Aorta: Unremarkable course, caliber, contour of the abdominal aorta.
No dissection, aneurysm, or periaortic fluid. Minimal
atherosclerosis.

Celiac: No significant atherosclerotic changes at the origin of the
celiac artery. Left gastric artery originates from the aorta has a
separate origin.

SMA: No significant atherosclerotic changes of the superior
mesenteric artery.

Renals: Renal arteries are patent. Single left and single right
renal artery.

IMA: Inferior mesenteric artery is patent. Left colic artery patent.
Superior rectal artery patent.

Right lower extremity:

Minimal atherosclerotic changes of the right iliac system. No
high-grade stenosis, ulceration, aneurysm, dissection. Hypogastric
artery is patent.

External iliac artery patent.

Common femoral artery patent. No significant atherosclerotic changes
of the common femoral artery.

Profunda femoris patent as well as the thigh branches.

No significant atherosclerotic changes of the femoropopliteal
system.

No significant calcifications of the tibial arteries. There is
decreased contrast filling the peroneal artery and the anterior
tibial artery distally. Uncertain if this represents late arrival of
the contrast bolus versus distal embolization. Contrast fills the
posterior tibial artery from the origin to the ankle.

Left lower extremity:

Minimal atherosclerotic changes of the left iliac system. No
high-grade stenosis or occlusion. No aneurysm. No ulceration. No
dissection. Hypogastric artery remains patent. External iliac artery
patent.

Common femoral artery patent without significant atherosclerotic
changes.

Profunda femoris patent as well as the thigh branches.

No significant atherosclerotic changes of the left femoropopliteal
system.

There is minimal contrast filling the distal tibial arteries without
calcifications of the tibial arteries. Uncertain if this represents
late arrival of the contrast bolus versus distal embolization.

Veins: Unremarkable appearance of the venous system.

Review of the MIP images confirms the above findings.

NON-VASCULAR

Lower chest: No acute.

Hepatobiliary: Diffusely decreased attenuation/enhancement of liver
parenchyma. Unremarkable gall bladder.

Pancreas: Unremarkable appearance of the pancreas. No
pericholecystic fluid or inflammatory changes. Unremarkable ductal
system.

Spleen: Unremarkable.

Adrenals/Urinary Tract: Unremarkable appearance of adrenal glands.

Right:

No hydronephrosis. Symmetric perfusion to the left. No
nephrolithiasis. Unremarkable course of the right ureter.

Left:

No hydronephrosis. Symmetric perfusion to the right. No
nephrolithiasis. Unremarkable course of the left ureter.

Unremarkable appearance of the urinary bladder .

Stomach/Bowel: Surgical changes of prior lap band unremarkable
appearance of small bowel. No evidence of obstruction. Mild
diverticular change without associated inflammation.. Appendix is
not visualized, however, no inflammatory changes are present
adjacent to the cecum to indicate an appendicitis.

Lymphatic: No lymphadenopathy

Mesenteric: No free fluid or air. No adenopathy.

Reproductive: Unremarkable appearance of the pelvic organs.

Other: Fat containing right inguinal hernia.

Musculoskeletal: No acute displaced fracture. Surgical changes of
prior discectomy of L5-S1 with hardware. Generator of the soft
tissues in the right flank with electrodes terminating at the
midthoracic spine, anterior and posterior within the canal.

Review of the MIP images confirms the above findings.
IMPRESSION: The bilateral tibial arteries are poorly opacified at the distal
segments on both the left and the right. It is uncertain if this
represents late arrival of the contrast bolus or decreased filling
distally secondary to distal embolization.

There is very minimal atherosclerotic change of the thoracic aorta,
abdominal aorta, and the iliac arteries with no aneurysm, ulcerated
plaque, or other definitive source of possible embolus. Aortic
Atherosclerosis ([id number]-NE4.4).

Left lower lobe 4 mm nodule. Non-contrast chest CT can be considered
in 12 months if patient is high-risk. This recommendation follows
the consensus statement: Guidelines for Management of Incidental
Pulmonary Nodules Detected on CT Images: From the [HOSPITAL]

Diverticular disease without evidence of acute diverticulitis.

Surgical changes of prior lap band.

Liver steatosis.

## 2019-09-28 MED FILL — AMLODIPINE BESYLATE 5 MG TA: 5 | 30 days supply | Qty: 45 | Fill #5

## 2019-10-26 MED FILL — AMLODIPINE BESYLATE 5 MG TA: 5 | 30 days supply | Qty: 45 | Fill #6

## 2019-10-26 MED FILL — ?OMEPRAZOLE 20MG CAP DR: 20 | 30 days supply | Qty: 60 | Fill #3

## 2019-11-13 ENCOUNTER — Telehealth: Payer: Self-pay

## 2019-11-13 ENCOUNTER — Telehealth (INDEPENDENT_AMBULATORY_CARE_PROVIDER_SITE_OTHER): Payer: Self-pay | Admitting: Physician Assistant

## 2019-11-13 ENCOUNTER — Encounter: Payer: Self-pay | Admitting: Physician Assistant

## 2019-11-13 VITALS — BP 120/86 | HR 95 | Ht 67.0 in

## 2019-11-13 DIAGNOSIS — I10 Essential (primary) hypertension: Secondary | ICD-10-CM

## 2019-11-13 DIAGNOSIS — R072 Precordial pain: Secondary | ICD-10-CM

## 2019-11-13 DIAGNOSIS — R55 Syncope and collapse: Secondary | ICD-10-CM

## 2019-11-13 DIAGNOSIS — E7800 Pure hypercholesterolemia, unspecified: Secondary | ICD-10-CM

## 2019-11-13 MED ORDER — AMLODIPINE BESYLATE 5 MG PO TABS: 5.0000 mg | ORAL_TABLET | Freq: Every day | ORAL | 3 refills | Status: DC

## 2019-11-13 NOTE — Telephone Encounter (Signed)
Called patient to discuss AVS instructions gave Kevin [name]'s recommendations and patient voiced understanding. AVS summary mailed to patient.

## 2019-11-13 NOTE — Patient Instructions (Signed)
Medication Instructions:  Your physician recommends that you continue on your current medications as directed. Please refer to the Current Medication list given to you today.  *If you need a refill on your cardiac medications before your next appointment, please call your pharmacy*  Lab Work: NONE ordered at this time of appointment   If you have labs (blood work) drawn today and your tests are completely normal, you will receive your results only by: Marland Kitchen MyChart Message (if you have MyChart) OR . A paper copy in the mail If you have any lab test that is abnormal or we need to change your treatment, we will call you to review the results.  Testing/Procedures: NONE ordered at this time of appointment   Follow-Up: At [facility], you and your health needs are our priority.  As part of our continuing mission to provide you with exceptional heart care, we have created designated Provider Care Teams.  These Care Teams include your primary Cardiologist (physician) and Advanced Practice Providers (APPs -  Physician Assistants and Nurse Practitioners) who all work together to provide you with the care you need, when you need it.  We recommend signing up for the patient portal called "MyChart".  Sign up information is provided on this After Visit Summary.  MyChart is used to connect with patients for Virtual Visits (Telemedicine).  Patients are able to view lab/test results, encounter notes, upcoming appointments, etc.  Non-urgent messages can be sent to your provider as well.   To learn more about what you can do with MyChart, go to [email or web address].    Your next appointment:   6 month(s)  The format for your next appointment:   In Person  Provider:   Quay Burow, MD or [name], PA-C   Other Instructions  Continue to monitor blood pressure  Increase physical activity safely     The Journal of Orthopaedic and Sports Physical Therapy, 44(10), 748.  [email or web address]">  How to Increase Your Level of Physical Activity Getting regular physical activity is important for your overall health and well-being. Most people do not get enough exercise. There are easy ways to increase your level of physical activity, even if you have not been very active in the past or if you are just starting out. How can increasing my physical activity affect me? Physical activity has many short-term and long-term benefits. Being active on a regular basis can improve your physical and mental health as well as provide other benefits. Physical health benefits  Helping you lose weight or maintain a healthy weight.  Strengthening your muscles and bones.  Reducing your risk of certain long-term (chronic) diseases, including heart disease, cancer, and diabetes.  Being able to move around more easily and for longer periods of time without getting tired (increased stamina).  Improving your ability to fight off illness (enhanced immunity).  Being able to sleep better.  Helping you stay healthy as you get older, including: ? Helping you stay mobile, or capable of walking and moving around. ? Preventing accidents, such as falls. ? Increasing life expectancy. Mental health benefits  Boosting your mood and improving your self-esteem.  Lowering your chance of having mental health problems, such as depression or anxiety.  Helping you feel good about your body. Other benefits  Finding new sources of fun and enjoyment.  Meeting new people who share a common interest. What steps can I take to be more physically active? Getting started  If you have a chronic illness or have not  been active for a while, check with your health care provider about how to get started. Ask your health care provider what activities are safe for you.  Start out slowly. Walking or doing some simple chair exercises is a good place to start, especially if you have not  been active before or for a long time.  Set goals that you can work toward. Ask your health care provider how much exercise is best for you. In general, most adults should: ? Do moderate-intensity exercise for at least 150 minutes each week (30 minutes on most days of the week) or vigorous exercise for at least 75 minutes each week, or a combination of these.  Moderate-intensity exercise can include walking at a quick pace, biking, yoga, water aerobics, or gardening.  Vigorous exercise involves activities that take more effort, such as jogging or running, playing sports, swimming laps, or jumping rope. ? Do strength exercises on at least 2 days each week. This can include weight lifting, body weight exercises, and resistance-band exercises.  Consider using a fitness tracker, such as a mobile phone app or a device worn like a watch, that will count the number of steps you take each day. Many people strive to reach 10,000 steps a day. Choosing activities  Try to find activities that you enjoy. You are more likely to commit to an exercise routine if it does not feel like a chore.  If you have bone or joint problems, choose low-impact exercises, like walking or swimming.  Use these tips for being successful with an exercise plan: ? Find a workout partner for accountability. ? Join a group or class, such as an aerobics class, cycling class, or sports team. ? Make family time active. Go for a walk, bike, or swim. ? Include a variety of exercises each week. Being active in your daily routines Besides your formal exercise plans, you can find ways to do physical activity during your daily routines, such as:  Walking or biking to work or to the store.  Taking the stairs instead of the elevator.  Parking farther away from the door at work or at the store.  Planning walking meetings.  Walking around while you are on the phone.  Where to find more information  Centers for Disease Control and  Prevention: CampusCasting.com.pt  President's Council on Fitness, Sports & Nutrition: www.fitness.gov  ChooseMyPlate: https://ball-collins.biz/ Contact a health care provider if:  You have headaches, muscle aches, or joint pain.  You feel dizzy or light-headed while exercising.  You faint.  You have chest pain while exercising. Summary  Exercise benefits your mind and body at any age, even if you are just starting out.  If you have a chronic illness or have not been active for a while, check with your health care provider before increasing your physical activity.  Choose activities that are safe and enjoyable for you. Ask your health care provider what activities are safe for you.  Start slowly. Tell your health care provider if you have problems as you start to increase your activity level. This information is not intended to replace advice given to you by your health care provider. Make sure you discuss any questions you have with your health care provider. Document Revised: 06/01/2019 Document Reviewed: 03/23/2019 Elsevier Patient Education  2020 [employer].

## 2019-11-13 NOTE — Progress Notes (Signed)
Virtual Visit via Video Note   This visit type was conducted due to national recommendations for restrictions regarding the COVID-19 Pandemic (e.g. social distancing) in an effort to limit this patient's exposure and mitigate transmission in our community.  Due to his co-morbid illnesses, this patient is at least at moderate risk for complications without adequate follow up.  This format is felt to be most appropriate for this patient at this time.  All issues noted in this document were discussed and addressed.  A limited physical exam was performed with this format.  Please refer to the patient's chart for his consent to telehealth for [facility].  Evaluation Performed:  Follow-up visit  This visit type was conducted due to national recommendations for restrictions regarding the COVID-19 Pandemic (e.g. social distancing).  This format is felt to be most appropriate for this patient at this time.  All issues noted in this document were discussed and addressed.  No physical exam was performed (except for noted visual exam findings with Video Visits).  Please refer to the patient's chart (MyChart message for video visits and phone note for telephone visits) for the patient's consent to telehealth for [facility]  Date:  11/13/2019   ID:  Kevin [name], DOB [birth year], MRN [id number]  Patient Location:  Home   Provider location:   Off-site  PCP:  [name], NP  Cardiologist:  Quay Burow, MD 03/18/2018  Electrophysiologist:  None   Chief Complaint:  Chest pain, HTN  History of Present Illness:    Kevin [name] is a 54 y.o. male who presents via audio/video conferencing for a telehealth visit today.    54 y.o. 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  He  has been very compliant with Covid restrictions.  However, because of being quarantined, his activity level has gone down.  He thinks he may have gained a few pounds.  He is working on trying to find a way to increase his activity.  His dog will appreciate this.  His episodes of chest pain have become extremely infrequent.  He has not had one in months.  This seems to correlate with an improvement in his blood pressure.  He remembers frequently seeing a diastolic of 301 and systolic greater than 601.  However, on his current medication regimen, his systolic is in the 093A to the 355D, diastolic is sometimes in the 90s, but generally less than 90.  This is the most well controlled his blood pressure has ever been.  He has episodes of presyncope with activity, especially outdoor activity in the summer.  Not so much over the winter and during COVID, due to decreased activity level.  He will feel them coming on, and have to stop what he is doing and sit down.  He describes vision graying out and being extremely weak.  He has checked his blood pressure after some of these episodes and it will be in the 32K and 02R systolic.  Because of his LAP-BAND, he is not able to drink a great deal of liquids at any given time.  He states he would drink a glass of water before going out, but has trouble taking in more than 8 ounces  at a time.  He has not noticed this much over the winter, but it has happened a couple of times when he was exercising more strenuously.   The patient does not have symptoms concerning for COVID-19 infection (fever, chills, cough, or new shortness of breath).    Prior CV studies:   The following studies were reviewed today:  ECHO: 03/26/2018 - Left ventricle: The cavity size was normal. Systolic function was normal. The estimated ejection fraction was in the range of 50% to 55%. Wall motion was normal; there were no regional wall motion abnormalities. Doppler parameters are  consistent with abnormal left ventricular relaxation (grade 1 diastolic dysfunction). - Aortic valve: There was no regurgitation. - Mitral valve: There was no regurgitation. - Right ventricle: Systolic function was normal. - Atrial septum: No defect or patent foramen ovale was identified. - Tricuspid valve: There was no significant regurgitation. - Pulmonic valve: There was trivial regurgitation. Impressions: - Normal LV systolic function without wall motion abnormalities. No significant valvular disease.  MYOVIEW: 03/21/2018  Nuclear stress EF: 49%.  The left ventricular ejection fraction is mildly decreased (45-54%).  The study is normal.  This is a low risk study.  There was no ST segment deviation noted during stress.  No T wave inversion was noted during stress.  Low risk stress nuclear study with normal perfusion and mildly reduced left ventricular systolic function. Visually estimated left ventricular systolic function appears normal. Consider comparison with echocardiography  Past Medical History:  Diagnosis Date  . Diabetes mellitus without complication ([place])   . Hypertension   . MRSA (methicillin resistant Staphylococcus aureus)    Past Surgical History:  Procedure Laterality Date  . BACK SURGERY    . LAPAROSCOPIC GASTRIC BANDING       Current Meds  Medication Sig  . acetaminophen (TYLENOL) 500 MG tablet Take 3,000-4,000 mg by mouth every 4 (four) hours as needed for moderate pain, fever or headache.   Marland Kitchen amLODipine (NORVASC) 5 MG tablet Take 1 tablet (5 mg total) by mouth daily. May take an extra 1/2 tablet (2.5 mg) for systolic greater than 914  . atorvastatin (LIPITOR) 40 MG tablet TAKE 1 TABLET BY MOUTH DAILY.  Marland Kitchen Blood Glucose Monitoring Suppl (TRUE METRIX METER) w/Device KIT Use as instructed  . enalapril (VASOTEC) 20 MG tablet Take 2 tablets (40 mg total) by mouth daily. Please fill as 90 day supply  . glimepiride (AMARYL) 4 MG tablet Take 1  tablet (4 mg total) by mouth daily before breakfast. Please fill as a 90 day supply  . glucose blood (TRUE METRIX BLOOD GLUCOSE TEST) test strip Use as instructed  . omeprazole (PRILOSEC) 20 MG capsule Take 1 capsule (20 mg total) by mouth 2 (two) times daily before a meal. Please fill as a 90 day supply  . sitaGLIPtin-metformin (JANUMET) 50-1000 MG tablet TAKE 1 TABLET BY MOUTH 2 (TWO) TIMES DAILY WITH A MEAL. Please fill as 90 day supply  . TRUEPLUS LANCETS 28G MISC Use as instructed     Allergies:   Patient has no known allergies.   Social History   Tobacco Use  . Smoking status: Former Smoker    Quit date: 2000    Years since quitting: 21.1  . Smokeless tobacco: Never Used  Substance Use Topics  . Alcohol use: No  . Drug use: No     Family Hx: The patient's family history includes Diabetes in his brother and sister; Heart attack in his brother; Heart disease in his  brother and mother; Hypertension in his brother, father, mother, and sister; Stroke in his brother.  ROS:   Please see the history of present illness.    All other systems reviewed and are negative.   Labs/Other Tests and Data Reviewed:    Recent Labs: 03/11/2019: ALT 45 06/05/2019: BUN 11; Creatinine, Ser 1.03; Potassium 4.6; Sodium 138   CBC    Component Value Date/Time   WBC 6.1 09/19/2018 1223   WBC 6.4 05/03/2017 0737   RBC 4.77 09/19/2018 1223   RBC 4.92 05/03/2017 0737   HGB 14.3 09/19/2018 1223   HCT 40.7 09/19/2018 1223   PLT 267 09/19/2018 1223   MCV 85 09/19/2018 1223   MCH 30.0 09/19/2018 1223   MCH 30.5 05/03/2017 0737   MCHC 35.1 09/19/2018 1223   MCHC 36.0 05/03/2017 0737   RDW 12.8 09/19/2018 1223   LYMPHSABS 2.0 05/03/2017 0737   MONOABS 0.6 05/03/2017 0737   EOSABS 0.1 05/03/2017 0737   BASOSABS 0.1 05/03/2017 0737    CMP Latest Ref Rng & Units 06/05/2019 03/11/2019 10/23/2017  Glucose 65 - 99 mg/dL 153(H) 93 202(H)  BUN 6 - 24 mg/dL 11 7 7   Creatinine 0.76 - 1.27 mg/dL 1.03 1.06  0.91  Sodium 134 - 144 mmol/L 138 139 139  Potassium 3.5 - 5.2 mmol/L 4.6 4.3 4.4  Chloride 96 - 106 mmol/L 100 101 100  CO2 20 - 29 mmol/L 22 21 23   Calcium 8.7 - 10.2 mg/dL 9.7 9.5 9.8  Total Protein 6.0 - 8.5 g/dL - 7.2 -  Total Bilirubin 0.0 - 1.2 mg/dL - 0.3 -  Alkaline Phos 39 - 117 IU/L - 80 -  AST 0 - 40 IU/L - 28 -  ALT 0 - 44 IU/L - 45(H) -     Recent Lipid Panel Lab Results  Component Value Date/Time   CHOL 144 03/11/2019 09:38 AM   TRIG 134 03/11/2019 09:38 AM   HDL 42 03/11/2019 09:38 AM   CHOLHDL 3.4 03/11/2019 09:38 AM   LDLCALC 75 03/11/2019 09:38 AM    Wt Readings from Last 3 Encounters:  06/05/19 200 lb 3.2 oz (90.8 kg)  04/16/19 182 lb (82.6 kg)  10/21/18 197 lb (89.4 kg)     Objective:    Vital Signs:  BP 120/86   Pulse 95   Ht 5' 7"  (1.702 m)   BMI 31.36 kg/m    54 y.o. male in no acute distress on video.  He denies shortness of breath, lower extremity edema, orthopnea or PND.   ASSESSMENT & PLAN:    1.  Chest pain: -Symptoms are greatly improved since his blood pressure has been better controlled. -I suspect the chest pain was related to untreated or poorly treated hypertension. -Report any change in symptoms, no further work-up at this time  2.  Hypertension: -His blood pressure is better controlled than it ever has been. -He is encouraged to continue to be compliant with his medications -If his systolic blood pressures consistently less than 110, consider decreasing the enalapril to 20 mg daily. -Because his diastolic blood pressure tends to run high, I would prefer to keep the amlodipine on board  3.  Presyncope: -Because his lap band prevents him from hydrating very well, I suspect it is due to dehydration and orthostasis -He only gets it when he has been exercising or working outside strenuously.  It has happened a couple of times indoors, but only after high-level workouts -His lap band prevents him from  taking in a great deal of  liquid at a time. -He is encouraged to hydrate by drinking water or an electrolyte solution such as Gatorade multiple times prior to exercising and then stopping during exercise long enough to take in more liquids. -If this does not work, can consider further evaluation  4.  Hyperlipidemia: -Followed by his PCP, he is due for labs again in July or sooner per PCP -Because of calcifications on his LAD and RCA seen on CT, goal LDL is 70    COVID-19 Education: The signs and symptoms of COVID-19 were discussed with the patient and how to seek care for testing (follow up with PCP or arrange E-visit).  The importance of social distancing was discussed today.  Patient Risk:   After full review of this patient's clinical status, I feel that they are at least moderate risk at this time.  Time:   Today, I have spent 25 minutes with the patient with telehealth technology discussing cardiac and other health issues.     Medication Adjustments/Labs and Tests Ordered: Current medicines are reviewed at length with the patient today.  Concerns regarding medicines are outlined above.  Tests Ordered: No orders of the defined types were placed in this encounter.  Medication Changes: No orders of the defined types were placed in this encounter.   Disposition:  Follow up with Quay Burow, MD   Signed, [name], PA-C  11/13/2019 8:33 AM    [place]

## 2019-11-23 MED FILL — ?OMEPRAZOLE 20 MG CAP DR: 20 | 30 days supply | Qty: 60 | Fill #4

## 2019-11-23 MED FILL — JANUMET 50-1,000 MG TABLET: 50-1000 | 30 days supply | Qty: 60 | Fill #3

## 2019-11-24 ENCOUNTER — Other Ambulatory Visit: Payer: Self-pay

## 2019-11-24 ENCOUNTER — Ambulatory Visit: Payer: Self-pay | Attending: Nurse Practitioner | Admitting: Nurse Practitioner

## 2019-11-24 ENCOUNTER — Encounter: Payer: Self-pay | Admitting: Nurse Practitioner

## 2019-11-24 VITALS — BP 136/84 | HR 105 | Temp 97.7°F | Ht 67.0 in | Wt 200.0 lb

## 2019-11-24 DIAGNOSIS — E1169 Type 2 diabetes mellitus with other specified complication: Secondary | ICD-10-CM

## 2019-11-24 DIAGNOSIS — E118 Type 2 diabetes mellitus with unspecified complications: Secondary | ICD-10-CM

## 2019-11-24 DIAGNOSIS — Z8719 Personal history of other diseases of the digestive system: Secondary | ICD-10-CM

## 2019-11-24 DIAGNOSIS — Z130 Encounter for screening for diseases of the blood and blood-forming organs and certain disorders involving the immune mechanism: Secondary | ICD-10-CM

## 2019-11-24 DIAGNOSIS — E785 Hyperlipidemia, unspecified: Secondary | ICD-10-CM

## 2019-11-24 DIAGNOSIS — I10 Essential (primary) hypertension: Secondary | ICD-10-CM

## 2019-11-24 LAB — POCT GLYCOSYLATED HEMOGLOBIN (HGB A1C): Hemoglobin A1C: 6.8 % — AB (ref 4.0–5.6)

## 2019-11-24 LAB — GLUCOSE, POCT (MANUAL RESULT ENTRY): POC Glucose: 156 mg/dl — AB (ref 70–99)

## 2019-11-24 MED ORDER — TRUEPLUS LANCETS 28G MISC: 6 refills | Status: DC

## 2019-11-24 MED ORDER — JANUMET 50-1000 MG PO TABS: ORAL_TABLET | ORAL | 1 refills | Status: DC

## 2019-11-24 MED ORDER — ENALAPRIL MALEATE 20 MG PO TABS: 40.0000 mg | ORAL_TABLET | Freq: Every day | ORAL | 1 refills | Status: DC

## 2019-11-24 MED ORDER — GLIMEPIRIDE 4 MG PO TABS: 4.0000 mg | ORAL_TABLET | Freq: Every day | ORAL | 1 refills | Status: DC

## 2019-11-24 MED ORDER — TRUE METRIX BLOOD GLUCOSE TEST VI STRP: ORAL_STRIP | 12 refills | Status: DC

## 2019-11-24 MED ORDER — OMEPRAZOLE 20 MG PO CPDR: 20.0000 mg | DELAYED_RELEASE_CAPSULE | Freq: Two times a day (BID) | ORAL | 3 refills | Status: DC

## 2019-11-24 MED FILL — ENALAPRIL MALEATE 20 MG TAB: 20 | 90 days supply | Qty: 180 | Fill #0

## 2019-11-24 MED FILL — AMLODIPINE BESYLATE 5 MG TA: 5 | 90 days supply | Qty: 135 | Fill #0

## 2019-11-24 MED FILL — TRUE METRIX TEST STRIP: 100 days supply | Qty: 200 | Fill #0

## 2019-11-24 MED FILL — TRUEplus LANCETS 28G MISC: 100 days supply | Qty: 200 | Fill #0

## 2019-11-24 MED FILL — GLIMEPIRIDE 4 MG TABS: 4 | 90 days supply | Qty: 90 | Fill #0

## 2019-11-24 NOTE — Progress Notes (Signed)
Assessment & Plan:  Kevin [name] was seen today for follow-up.  Diagnoses and all orders for this visit:  Essential hypertension -     CMP14+EGFR -     enalapril (VASOTEC) 20 MG tablet; Take 2 tablets (40 mg total) by mouth daily. Please fill as 90 day supply Continue all antihypertensives as prescribed.  Remember to bring in your blood pressure log with you for your follow up appointment.  DASH/Mediterranean Diets are healthier choices for HTN.    Type 2 diabetes mellitus with complication, without long-term current use of insulin (HCC) -     Glucose (CBG) -     HgB A1c -     Microalbumin/Creatinine Ratio, Urine -     glimepiride (AMARYL) 4 MG tablet; Take 1 tablet (4 mg total) by mouth daily before breakfast. Please fill as a 90 day supply -     glucose blood (TRUE METRIX BLOOD GLUCOSE TEST) test strip; Use as instructed. Check blood glucose level by fingerstick twice per day. -     sitaGLIPtin-metformin (JANUMET) 50-1000 MG tablet; TAKE 1 TABLET BY MOUTH 2 (TWO) TIMES DAILY WITH A MEAL. Please fill as 90 day supply -     TRUEplus Lancets 28G MISC; Use as instructed. Check blood glucose level by fingerstick twice per day. Continue blood sugar control as discussed in office today, low carbohydrate diet, and regular physical exercise as tolerated, 150 minutes per week (30 min each day, 5 days per week, or 50 min 3 days per week). Keep blood sugar logs with fasting goal of 90-130 mg/dl, post prandial (after you eat) less than 180.  For Hypoglycemia: BS <60 and Hyperglycemia BS >400; contact the clinic ASAP. Annual eye exams and foot exams are recommended.   History of gastritis -     omeprazole (PRILOSEC) 20 MG capsule; Take 1 capsule (20 mg total) by mouth 2 (two) times daily before a meal. Please fill as a 90 day supply  Hyperlipidemia associated with type 2 diabetes mellitus ([place]) -     Lipid panel INSTRUCTIONS: Work on a low fat, heart healthy diet and participate in regular aerobic  exercise program by working out at least 150 minutes per week; 5 days a week-30 minutes per day. Avoid red meat/beef/steak,  fried foods. junk foods, sodas, sugary drinks, unhealthy snacking, alcohol and smoking.  Drink at least 80 oz of water per day and monitor your carbohydrate intake daily.    Screening for deficiency anemia -     CBC    Patient has been counseled on age-appropriate routine health concerns for screening and prevention. These are reviewed and up-to-date. Referrals have been placed accordingly. Immunizations are up-to-date or declined.    Subjective:   Chief Complaint  Patient presents with  . Follow-up    Pt. is here for 3 months follow up.    HPI Kevin [name] 54 y.o. male presents to office today for follow up.  has a past medical history of Diabetes mellitus without complication ([place]), Hypertension, and MRSA (methicillin resistant Staphylococcus aureus).   Takes omeprazole for history of gastritis  Tachycardic today and BP log also shows mixed readings with HR as high as 120s. 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 I am hesitant to place him on a beta blocker with his history of hypotension. Will continue to monitor at this time. he is asymptomatic. however will . Would like recommendations from Cardiology prior to starting.      DM TYPE 2  Well controlled and at goal <7.0.  He is currently taking glimepiride 4 mg daily and Janumet 50-1000 mg BID. Denies any symptoms of hypoglycemia. Overdue for eye exam.  Lab Results  Component Value Date   HGBA1C 6.8 (A) 11/24/2019   Essential Hypertension Well controlled. Taking amlodipine 5 mg and enalapril 20 mg daily as prescribed. Denies chest pain, shortness of breath, palpitations, lightheadedness, dizziness, headaches or BLE edema.  BP Readings from Last 3 Encounters:  11/24/19 136/84  11/13/19 120/86  06/05/19 (!) 150/98    Dyslipidemia LDL not at goal of <70 at this time. He is taking  atorvastatin 40 mg daily with no history of statin intolerance.  Lab Results  Component Value Date   LDLCALC 75 03/11/2019    Review of Systems  Constitutional: Negative for fever, malaise/fatigue and weight loss.  HENT: Negative.  Negative for nosebleeds.   Eyes: Negative.  Negative for blurred vision, double vision and photophobia.  Respiratory: Negative.  Negative for cough and shortness of breath.   Cardiovascular: Negative.  Negative for chest pain, palpitations and leg swelling.  Gastrointestinal: Negative.  Negative for heartburn, nausea and vomiting.  Musculoskeletal: Negative.  Negative for myalgias.  Neurological: Negative.  Negative for dizziness, focal weakness, seizures and headaches.  Psychiatric/Behavioral: Negative.  Negative for suicidal ideas.    Past Medical History:  Diagnosis Date  . Diabetes mellitus without complication ([place])   . Hypertension   . MRSA (methicillin resistant Staphylococcus aureus)     Past Surgical History:  Procedure Laterality Date  . BACK SURGERY    . LAPAROSCOPIC GASTRIC BANDING      Family History  Problem Relation Age of Onset  . Heart disease Mother   . Hypertension Mother   . Diabetes Sister   . Hypertension Sister   . Stroke Brother   . Diabetes Brother   . Heart disease Brother   . Heart attack Brother   . Hypertension Brother   . Hypertension Father     Social History Reviewed with no changes to be made today.   Outpatient Medications Prior to Visit  Medication Sig Dispense Refill  . acetaminophen (TYLENOL) 500 MG tablet Take 3,000-4,000 mg by mouth every 4 (four) hours as needed for moderate pain, fever or headache.     Marland Kitchen amLODipine (NORVASC) 5 MG tablet Take 1 tablet (5 mg total) by mouth daily. May take an extra 1/2 tablet (2.5 mg) for systolic greater than 378 90 tablet 3  . atorvastatin (LIPITOR) 40 MG tablet TAKE 1 TABLET BY MOUTH DAILY. 90 tablet 3  . Blood Glucose Monitoring Suppl (TRUE METRIX METER)  w/Device KIT Use as instructed 1 kit 0  . glimepiride (AMARYL) 4 MG tablet Take 1 tablet (4 mg total) by mouth daily before breakfast. Please fill as a 90 day supply 90 tablet 1  . glucose blood (TRUE METRIX BLOOD GLUCOSE TEST) test strip Use as instructed 100 each 12  . omeprazole (PRILOSEC) 20 MG capsule Take 1 capsule (20 mg total) by mouth 2 (two) times daily before a meal. Please fill as a 90 day supply 180 capsule 3  . sitaGLIPtin-metformin (JANUMET) 50-1000 MG tablet TAKE 1 TABLET BY MOUTH 2 (TWO) TIMES DAILY WITH A MEAL. Please fill as 90 day supply 180 tablet 1  . TRUEPLUS LANCETS 28G MISC Use as instructed 100 each 3  . pneumococcal 23 valent vaccine (PNEUMOVAX 23) 25 MCG/0.5ML injection To be administered by the Pharmacist (Patient not taking: Reported on 06/05/2019) 0.5 mL  0  . enalapril (VASOTEC) 20 MG tablet Take 2 tablets (40 mg total) by mouth daily. Please fill as 90 day supply 180 tablet 1   No facility-administered medications prior to visit.    No Known Allergies     Objective:    BP 136/84 (BP Location: Left Arm, Patient Position: Sitting, Cuff Size: Normal)   Pulse (!) 105   Temp 97.7 F (36.5 C) (Temporal)   Ht _0  (1.702 m)   Wt 200 lb (90.7 kg)   SpO2 97%   BMI 31.32 kg/m  Wt Readings from Last 3 Encounters:  11/24/19 200 lb (90.7 kg)  06/05/19 200 lb 3.2 oz (90.8 kg)  04/16/19 182 lb (82.6 kg)    Physical Exam Vitals and nursing note reviewed.  Constitutional:      Appearance: He is well-developed.  HENT:     Head: Normocephalic and atraumatic.  Cardiovascular:     Rate and Rhythm: Regular rhythm. Tachycardia present.     Heart sounds: Normal heart sounds. No murmur. No friction rub. No gallop.   Pulmonary:     Effort: Pulmonary effort is normal. No tachypnea or respiratory distress.     Breath sounds: Normal breath sounds. No decreased breath sounds, wheezing, rhonchi or rales.  Chest:     Chest wall: No tenderness.  Abdominal:     General:  Bowel sounds are normal.     Palpations: Abdomen is soft.  Musculoskeletal:        General: Normal range of motion.     Cervical back: Normal range of motion.  Skin:    General: Skin is warm and dry.  Neurological:     Mental Status: He is alert and oriented to person, place, and time.     Coordination: Coordination normal.  Psychiatric:        Behavior: Behavior normal. Behavior is cooperative.        Thought Content: Thought content normal.        Judgment: Judgment normal.          Patient has been counseled extensively about nutrition and exercise as well as the importance of adherence with medications and regular follow-up. The patient was given clear instructions to go to ER or return to medical center if symptoms don't improve, worsen or new problems develop. The patient verbalized understanding.   Follow-up: Return in about 3 months (around 02/24/2020).   [name], FNP-BC [facility] [place], [place]   11/24/2019, 1:21 PM

## 2019-11-25 LAB — CBC
Hematocrit: 41.4 % (ref 37.5–51.0)
Hemoglobin: 14.1 g/dL (ref 13.0–17.7)
MCH: 29.3 pg (ref 26.6–33.0)
MCHC: 34.1 g/dL (ref 31.5–35.7)
MCV: 86 fL (ref 79–97)
Platelets: 266 10*3/uL (ref 150–450)
RBC: 4.82 x10E6/uL (ref 4.14–5.80)
RDW: 14.4 % (ref 11.6–15.4)
WBC: 6.7 10*3/uL (ref 3.4–10.8)

## 2019-11-25 LAB — MICROALBUMIN / CREATININE URINE RATIO
Creatinine, Urine: 281.6 mg/dL
Microalb/Creat Ratio: 4 mg/g creat (ref 0–29)
Microalbumin, Urine: 12.3 ug/mL

## 2019-11-25 LAB — CMP14+EGFR
ALT: 50 IU/L — ABNORMAL HIGH (ref 0–44)
AST: 36 IU/L (ref 0–40)
Albumin/Globulin Ratio: 1.8 (ref 1.2–2.2)
Albumin: 4.6 g/dL (ref 3.8–4.9)
Alkaline Phosphatase: 72 IU/L (ref 39–117)
BUN/Creatinine Ratio: 14 (ref 9–20)
BUN: 16 mg/dL (ref 6–24)
Bilirubin Total: 0.6 mg/dL (ref 0.0–1.2)
CO2: 22 mmol/L (ref 20–29)
Calcium: 10.2 mg/dL (ref 8.7–10.2)
Chloride: 100 mmol/L (ref 96–106)
Creatinine, Ser: 1.16 mg/dL (ref 0.76–1.27)
GFR calc Af Amer: 82 mL/min/{1.73_m2} (ref >59)
GFR calc non Af Amer: 71 mL/min/{1.73_m2} (ref >59)
Globulin, Total: 2.6 g/dL (ref 1.5–4.5)
Glucose: 153 mg/dL — ABNORMAL HIGH (ref 65–99)
Potassium: 5.2 mmol/L (ref 3.5–5.2)
Sodium: 137 mmol/L (ref 134–144)
Total Protein: 7.2 g/dL (ref 6.0–8.5)

## 2019-11-25 LAB — LIPID PANEL
Chol/HDL Ratio: 3.3 ratio (ref 0.0–5.0)
Cholesterol, Total: 137 mg/dL (ref 100–199)
HDL: 41 mg/dL (ref >39)
LDL Chol Calc (NIH): 80 mg/dL (ref 0–99)
Triglycerides: 79 mg/dL (ref 0–149)
VLDL Cholesterol Cal: 16 mg/dL (ref 5–40)

## 2019-12-07 MED FILL — ?ATORVASTATIN 40MG TABLET: 40 | 90 days supply | Qty: 90 | Fill #3

## 2019-12-18 ENCOUNTER — Ambulatory Visit: Payer: Self-pay

## 2020-01-19 ENCOUNTER — Emergency Department (HOSPITAL_COMMUNITY)
Admission: EM | Admit: 2020-01-19 | Discharge: 2020-01-19 | Disposition: A | Discharge status: Home / Self Care | Payer: Self-pay | Attending: Emergency Medicine | Admitting: Emergency Medicine

## 2020-01-19 ENCOUNTER — Encounter (HOSPITAL_COMMUNITY): Payer: Self-pay

## 2020-01-19 ENCOUNTER — Other Ambulatory Visit: Payer: Self-pay

## 2020-01-19 DIAGNOSIS — E119 Type 2 diabetes mellitus without complications: Secondary | ICD-10-CM | POA: Insufficient documentation

## 2020-01-19 DIAGNOSIS — I10 Essential (primary) hypertension: Secondary | ICD-10-CM | POA: Insufficient documentation

## 2020-01-19 DIAGNOSIS — Z79899 Other long term (current) drug therapy: Secondary | ICD-10-CM | POA: Insufficient documentation

## 2020-01-19 DIAGNOSIS — Z87891 Personal history of nicotine dependence: Secondary | ICD-10-CM | POA: Insufficient documentation

## 2020-01-19 DIAGNOSIS — Z7984 Long term (current) use of oral hypoglycemic drugs: Secondary | ICD-10-CM | POA: Insufficient documentation

## 2020-01-19 DIAGNOSIS — H6122 Impacted cerumen, left ear: Secondary | ICD-10-CM | POA: Insufficient documentation

## 2020-01-19 NOTE — ED Provider Notes (Signed)
[facility] DEPT Provider Note   CSN: [id number] Arrival date & time: 01/19/20  0305     History Chief Complaint  Patient presents with  . Cerumen Impaction    Kevin [name] is a 54 y.o. male.   Ear Fullness This is a new problem. The current episode started 1 to 2 hours ago. The problem occurs constantly. The problem has not changed since onset.Pertinent negatives include no chest pain and no abdominal pain. Nothing aggravates the symptoms. Nothing relieves the symptoms. He has tried nothing for the symptoms. The treatment provided no relief.       Past Medical History:  Diagnosis Date  . Diabetes mellitus without complication ([place])   . Hypertension   . MRSA (methicillin resistant Staphylococcus aureus)     Patient Active Problem List   Diagnosis Date Noted  . Toe pain, chronic, left 10/07/2018  . Hyperlipidemia associated with type 2 diabetes mellitus ([place]) 09/19/2018  . Hyperlipidemia 03/18/2018  . Dyspnea on exertion 03/18/2018  . Chest pain 03/18/2018  . Diabetes mellitus without complication ([place]) 35/78/9784  . Essential hypertension 11/11/2017    Past Surgical History:  Procedure Laterality Date  . BACK SURGERY    . LAPAROSCOPIC GASTRIC BANDING         Family History  Problem Relation Age of Onset  . Heart disease Mother   . Hypertension Mother   . Diabetes Sister   . Hypertension Sister   . Stroke Brother   . Diabetes Brother   . Heart disease Brother   . Heart attack Brother   . Hypertension Brother   . Hypertension Father     Social History   Tobacco Use  . Smoking status: Former Smoker    Quit date: 2000    Years since quitting: 21.3  . Smokeless tobacco: Never Used  Substance Use Topics  . Alcohol use: No  . Drug use: No    Home Medications Prior to Admission medications   Medication Sig Start Date End Date Taking? Authorizing Provider  acetaminophen (TYLENOL) 500 MG tablet Take 3,000-4,000 mg by  mouth every 4 (four) hours as needed for moderate pain, fever or headache.     [provider]  amLODipine (NORVASC) 5 MG tablet Take 1 tablet (5 mg total) by mouth daily. May take an extra 1/2 tablet (2.5 mg) for systolic greater than 784 11/13/19   Barrett, Rhonda G, PA-C  atorvastatin (LIPITOR) 40 MG tablet TAKE 1 TABLET BY MOUTH DAILY. 03/16/19   Lorretta Harp, MD  Blood Glucose Monitoring Suppl (TRUE METRIX METER) w/Device KIT Use as instructed 10/23/17   [name], NP  enalapril (VASOTEC) 20 MG tablet Take 2 tablets (40 mg total) by mouth daily. Please fill as 90 day supply 11/24/19 02/22/20  [name], NP  glimepiride (AMARYL) 4 MG tablet Take 1 tablet (4 mg total) by mouth daily before breakfast. Please fill as a 90 day supply 11/24/19 02/22/20  [name], NP  glucose blood (TRUE METRIX BLOOD GLUCOSE TEST) test strip Use as instructed. Check blood glucose level by fingerstick twice per day. 11/24/19   [name], NP  omeprazole (PRILOSEC) 20 MG capsule Take 1 capsule (20 mg total) by mouth 2 (two) times daily before a meal. Please fill as a 90 day supply 11/24/19 02/22/20  [name], NP  pneumococcal 23 valent vaccine (PNEUMOVAX 23) 25 MCG/0.5ML injection To be administered by the Pharmacist Patient not taking: Reported on 06/05/2019 11/26/17  Tresa Garter, MD  sitaGLIPtin-metformin (JANUMET) 50-1000 MG tablet TAKE 1 TABLET BY MOUTH 2 (TWO) TIMES DAILY WITH A MEAL. Please fill as 90 day supply 11/24/19   [name], NP  TRUEplus Lancets 28G MISC Use as instructed. Check blood glucose level by fingerstick twice per day. 11/24/19   [name], NP    Allergies    Patient has no known allergies.  Review of Systems   Review of Systems  HENT: Positive for ear discharge and hearing loss (left ear).   Cardiovascular: Negative for chest pain.  Gastrointestinal: Negative for abdominal pain.  All other systems reviewed and are  negative.   Physical Exam Updated Vital Signs BP (!) 150/112   Pulse 90   Temp 98 F (36.7 C) (Oral)   Resp 18   Ht 5' 7"  (1.702 m)   Wt 86.2 kg   SpO2 100%   BMI 29.76 kg/m   Physical Exam Vitals and nursing note reviewed.  Constitutional:      Appearance: He is well-developed.  HENT:     Head: Normocephalic and atraumatic.     Right Ear: No decreased hearing noted. There is no impacted cerumen.     Left Ear: Decreased hearing noted. There is impacted cerumen.     Mouth/Throat:     Mouth: Mucous membranes are dry.     Pharynx: Oropharynx is clear.  Cardiovascular:     Rate and Rhythm: Normal rate.  Pulmonary:     Effort: Pulmonary effort is normal. No respiratory distress.  Abdominal:     General: There is no distension.  Musculoskeletal:        General: Normal range of motion.     Cervical back: Normal range of motion.  Skin:    General: Skin is warm and dry.  Neurological:     General: No focal deficit present.     Mental Status: He is alert.     ED Results / Procedures / Treatments   Labs (all labs ordered are listed, but only abnormal results are displayed) Labs Reviewed - No data to display  EKG None  Radiology No results found.  Procedures Procedures (including critical care time)  Medications Ordered in ED Medications - No data to display  ED Course  I have reviewed the triage vital signs and the nursing notes.  Pertinent labs & imaging results that were available during my care of the patient were reviewed by me and considered in my medical decision making (see chart for details).    MDM Rules/Calculators/A&P                      Decreased hearing likely related to cerumen impaction. Improved after irrigaation.  Final Clinical Impression(s) / ED Diagnoses Final diagnoses:  Impacted cerumen of left ear    Rx / DC Orders ED Discharge Orders    None       Reannon Candella, Corene Cornea, MD 01/19/20 [phone]

## 2020-01-19 NOTE — ED Triage Notes (Signed)
C/o ear wax in left ear. Wants irrigated.

## 2020-01-19 NOTE — ED Notes (Addendum)
Large amount of ear wax removed from left ear. Pt sts he can hear again.

## 2020-02-09 ENCOUNTER — Other Ambulatory Visit: Payer: Self-pay

## 2020-02-09 ENCOUNTER — Ambulatory Visit: Payer: Self-pay

## 2020-02-23 ENCOUNTER — Telehealth: Payer: Self-pay

## 2020-02-23 NOTE — Telephone Encounter (Signed)
Triage call received from pt inquiring about rescheduling arteriogram with Dr. Chestine Spore that he had to cancel d/t pandemic. Reports he's having the same issues he had last year with toes turning bluish black in winter and improve in spring. Symptoms are starting to start back now during the summer. Reports some pain, but not in great discomfort as before. Spoke with Dr. Chestine Spore who recommended to schedule pt for OV within next month, no studies needed.  Pt scheduled for 03/29/20 at 0900. Pt voiced understanding.

## 2020-02-29 MED FILL — GLIMEPIRIDE 4 MG TABS: 4 | 90 days supply | Qty: 90 | Fill #1

## 2020-02-29 MED FILL — AMLODIPINE BESYLATE 5 MG TA: 5 | 90 days supply | Qty: 135 | Fill #1

## 2020-02-29 MED FILL — ENALAPRIL MALEATE 20 MG TAB: 20 | 90 days supply | Qty: 180 | Fill #1

## 2020-03-02 ENCOUNTER — Encounter: Payer: Self-pay | Admitting: Nurse Practitioner

## 2020-03-02 ENCOUNTER — Other Ambulatory Visit: Payer: Self-pay

## 2020-03-02 ENCOUNTER — Ambulatory Visit: Payer: Self-pay | Attending: Nurse Practitioner | Admitting: Nurse Practitioner

## 2020-03-02 ENCOUNTER — Other Ambulatory Visit: Payer: Self-pay | Admitting: Nurse Practitioner

## 2020-03-02 DIAGNOSIS — E1169 Type 2 diabetes mellitus with other specified complication: Secondary | ICD-10-CM

## 2020-03-02 DIAGNOSIS — Z8719 Personal history of other diseases of the digestive system: Secondary | ICD-10-CM

## 2020-03-02 DIAGNOSIS — K0889 Other specified disorders of teeth and supporting structures: Secondary | ICD-10-CM

## 2020-03-02 DIAGNOSIS — I10 Essential (primary) hypertension: Secondary | ICD-10-CM

## 2020-03-02 DIAGNOSIS — E118 Type 2 diabetes mellitus with unspecified complications: Secondary | ICD-10-CM

## 2020-03-02 DIAGNOSIS — E785 Hyperlipidemia, unspecified: Secondary | ICD-10-CM

## 2020-03-02 MED ORDER — ATORVASTATIN CALCIUM 40 MG PO TABS: 40.0000 mg | ORAL_TABLET | Freq: Every day | ORAL | 3 refills | Status: DC

## 2020-03-02 MED ORDER — GLIMEPIRIDE 4 MG PO TABS: 4.0000 mg | ORAL_TABLET | Freq: Every day | ORAL | 1 refills | Status: DC

## 2020-03-02 MED ORDER — OMEPRAZOLE 20 MG PO CPDR: 20.0000 mg | DELAYED_RELEASE_CAPSULE | Freq: Two times a day (BID) | ORAL | 3 refills | Status: DC

## 2020-03-02 MED ORDER — JANUMET 50-1000 MG PO TABS: ORAL_TABLET | ORAL | 1 refills | Status: DC

## 2020-03-02 MED ORDER — ENALAPRIL MALEATE 20 MG PO TABS: 40.0000 mg | ORAL_TABLET | Freq: Every day | ORAL | 1 refills | Status: DC

## 2020-03-02 MED ORDER — ACETAMINOPHEN-CODEINE #3 300-30 MG PO TABS: 1.0000 | ORAL_TABLET | Freq: Two times a day (BID) | ORAL | 0 refills | Status: AC | PRN

## 2020-03-02 MED FILL — OMEPRAZOLE 20 MG CAP: 20 | 90 days supply | Qty: 180 | Fill #0

## 2020-03-02 MED FILL — ATORVASTATIN CALCIUM 40 MG: 40 | 90 days supply | Qty: 90 | Fill #0

## 2020-03-02 NOTE — Progress Notes (Signed)
Virtual Visit via Telephone Note Due to national recommendations of social distancing due to COVID 19, telehealth visit is felt to be most appropriate for this patient at this time.  I discussed the limitations, risks, security and privacy concerns of performing an evaluation and management service by telephone and the availability of in person appointments. I also discussed with the patient that there may be a patient responsible charge related to this service. The patient expressed understanding and agreed to proceed.    I connected with Kevin [name] on 03/02/20  at   8:30 AM EDT  EDT by telephone and verified that I am speaking with the correct person using two identifiers.   Consent I discussed the limitations, risks, security and privacy concerns of performing an evaluation and management service by telephone and the availability of in person appointments. I also discussed with the patient that there may be a patient responsible charge related to this service. The patient expressed understanding and agreed to proceed.   Location of Patient: Private Residence    [facility] of Provider[facility]    Persons participating in Telemedicine visit: Kevin Denver FNP-BC [facility] [place] CMA Kevin [name]    History of Present Illness: Telemedicine visit for: F/U  has a past medical history of Diabetes mellitus without complication (HCC), Hypertension, and MRSA (methicillin resistant Staphylococcus aureus).   Main concern today is oral pain from poor dentition.  He is status post multiple dental extractions secondary to poor dentition.  Has lost 40 pounds over the past 3 months mostly intentional but secondary to current dental state.  He has an appointment on Monday for further evaluation and recommendation remaining teeth.  He currently denies any signs or symptoms of infection or abscess but is having a significant amount of oral pain.  Wt Readings from Last 3  Encounters:  01/19/20 190 lb (86.2 kg)  11/24/19 200 lb (90.7 kg)  06/05/19 200 lb 3.2 oz (90.8 kg)   Has not been consistently monitoring his blood pressure or blood glucose levels at home however he has been adherent with taking his medications.  Current medications for diabetes include glimepiride 4 mg daily and Janumet 50-1000 mg twice daily.  He is on ACE and statin.  Diabetes is well controlled at this time. Lab Results  Component Value Date   LDLCALC 80 11/24/2019   Lab Results  Component Value Date   HGBA1C 6.8 (A) 11/24/2019   BP Readings from Last 3 Encounters:  01/19/20 (!) 150/112  11/24/19 136/84  11/13/19 120/86     Past Medical History:  Diagnosis Date  . Diabetes mellitus without complication (HCC)   . Hypertension   . MRSA (methicillin resistant Staphylococcus aureus)     Past Surgical History:  Procedure Laterality Date  . BACK SURGERY    . LAPAROSCOPIC GASTRIC BANDING      Family History  Problem Relation Age of Onset  . Heart disease Mother   . Hypertension Mother   . Diabetes Sister   . Hypertension Sister   . Stroke Brother   . Diabetes Brother   . Heart disease Brother   . Heart attack Brother   . Hypertension Brother   . Hypertension Father     Social History   Socioeconomic History  . Marital status: Divorced    Spouse name: Not on file  . Number of children: Not on file  . Years of education: Not on file  . Highest education level: Not on file  Occupational History  . Not on file  Tobacco Use  . Smoking status: Former Smoker    Quit date: 2000    Years since quitting: 21.4  . Smokeless tobacco: Never Used  Vaping Use  . Vaping Use: Never used  Substance and Sexual Activity  . Alcohol use: No  . Drug use: No  . Sexual activity: Yes  Other Topics Concern  . Not on file  Social History Narrative  . Not on file   Social Determinants of Health   Financial Resource Strain:   . Difficulty of Paying Living Expenses:   Food  Insecurity:   . Worried About Charity fundraiser in the Last Year:   . Arboriculturist in the Last Year:   Transportation Needs:   . Film/video editor (Medical):   Marland Kitchen Lack of Transportation (Non-Medical):   Physical Activity:   . Days of Exercise per Week:   . Minutes of Exercise per Session:   Stress:   . Feeling of Stress :   Social Connections:   . Frequency of Communication with Friends and Family:   . Frequency of Social Gatherings with Friends and Family:   . Attends Religious Services:   . Active Member of Clubs or Organizations:   . Attends Archivist Meetings:   Marland Kitchen Marital Status:      Observations/Objective: Awake, alert and oriented x 3   Review of Systems  Constitutional: Negative for fever, malaise/fatigue and weight loss.  HENT: Negative for nosebleeds.        See HPI  Eyes: Negative.  Negative for blurred vision, double vision and photophobia.  Respiratory: Negative.  Negative for cough and shortness of breath.   Cardiovascular: Negative.  Negative for chest pain, palpitations and leg swelling.  Gastrointestinal: Negative.  Negative for heartburn, nausea and vomiting.  Musculoskeletal: Negative.  Negative for myalgias.  Neurological: Negative.  Negative for dizziness, focal weakness, seizures and headaches.  Psychiatric/Behavioral: Negative.  Negative for suicidal ideas.    Assessment and Plan: Kevin [name] was seen today for follow-up.  Diagnoses and all orders for this visit:  Pain, dental -     acetaminophen-codeine (TYLENOL #3) 300-30 MG tablet; Take 1-2 tablets by mouth every 12 (twelve) hours as needed for moderate pain or severe pain.  History of gastritis -     omeprazole (PRILOSEC) 20 MG capsule; Take 1 capsule (20 mg total) by mouth 2 (two) times daily before a meal. Please fill as a 90 day supply  Type 2 diabetes mellitus with complication, without long-term current use of insulin (HCC) -     glimepiride (AMARYL) 4 MG tablet; Take 1  tablet (4 mg total) by mouth daily before breakfast. Please fill as a 90 day supply -     sitaGLIPtin-metformin (JANUMET) 50-1000 MG tablet; TAKE 1 TABLET BY MOUTH 2 (TWO) TIMES DAILY WITH A MEAL. Please fill as 90 day supply Continue blood sugar control as discussed in office today, low carbohydrate diet, and regular physical exercise as tolerated, 150 minutes per week (30 min each day, 5 days per week, or 50 min 3 days per week). Keep blood sugar logs with fasting goal of 90-130 mg/dl, post prandial (after you eat) less than 180.  For Hypoglycemia: BS <60 and Hyperglycemia BS >400; contact the clinic ASAP. Annual eye exams and foot exams are recommended.   Essential hypertension -     enalapril (VASOTEC) 20 MG tablet; Take 2 tablets (40 mg total) by mouth  daily. Please fill as 90 day supply Continue all antihypertensives as prescribed.  Remember to bring in your blood pressure log with you for your follow up appointment.  DASH/Mediterranean Diets are healthier choices for HTN.    Hyperlipidemia associated with type 2 diabetes mellitus (HCC) -     atorvastatin (LIPITOR) 40 MG tablet; Take 1 tablet (40 mg total) by mouth daily. INSTRUCTIONS: Work on a low fat, heart healthy diet and participate in regular aerobic exercise program by working out at least 150 minutes per week; 5 days a week-30 minutes per day. Avoid red meat/beef/steak,  fried foods. junk foods, sodas, sugary drinks, unhealthy snacking, alcohol and smoking.  Drink at least 80 oz of water per day and monitor your carbohydrate intake daily.     Follow Up Instructions Return in about 3 months (around 06/02/2020).     I discussed the assessment and treatment plan with the patient. The patient was provided an opportunity to ask questions and all were answered. The patient agreed with the plan and demonstrated an understanding of the instructions.   The patient was advised to call back or seek an in-person evaluation if the symptoms  worsen or if the condition fails to improve as anticipated.  I provided 16 minutes of non-face-to-face time during this encounter including median intraservice time, reviewing previous notes, labs, imaging, medications and explaining diagnosis and management.  Claiborne Rigg, FNP-BC

## 2020-03-29 ENCOUNTER — Ambulatory Visit: Payer: Self-pay | Admitting: Vascular Surgery

## 2020-04-09 ENCOUNTER — Ambulatory Visit (HOSPITAL_COMMUNITY)
Admission: RE | Admit: 2020-04-09 | Discharge: 2020-04-09 | Disposition: A | Discharge status: Home / Self Care | Payer: Self-pay | Source: Ambulatory Visit | Attending: Family Medicine | Admitting: Family Medicine

## 2020-04-09 ENCOUNTER — Encounter (HOSPITAL_COMMUNITY): Payer: Self-pay

## 2020-04-09 ENCOUNTER — Ambulatory Visit (INDEPENDENT_AMBULATORY_CARE_PROVIDER_SITE_OTHER): Payer: Self-pay

## 2020-04-09 ENCOUNTER — Other Ambulatory Visit: Payer: Self-pay

## 2020-04-09 VITALS — BP 149/96 | HR 102 | Temp 98.5°F | Resp 16

## 2020-04-09 DIAGNOSIS — M65331 Trigger finger, right middle finger: Secondary | ICD-10-CM

## 2020-04-09 DIAGNOSIS — M79644 Pain in right finger(s): Secondary | ICD-10-CM

## 2020-04-09 NOTE — ED Triage Notes (Signed)
Pt presents with pain in the right middle finger x 6-8 weeks, worsens in the past week. States he used a splint without relief of the pain.

## 2020-04-11 ENCOUNTER — Encounter: Payer: Self-pay | Admitting: Nurse Practitioner

## 2020-04-11 ENCOUNTER — Other Ambulatory Visit: Payer: Self-pay | Admitting: Nurse Practitioner

## 2020-04-11 DIAGNOSIS — M65331 Trigger finger, right middle finger: Secondary | ICD-10-CM

## 2020-04-11 NOTE — ED Provider Notes (Signed)
[facility]   [id number] 04/09/20 Arrival Time: 1643  ASSESSMENT & PLAN:  1. Trigger middle finger of right hand     Will need to see hand specialist. No obvious abnormalities on x-rays.  Recommend:  Follow-up Information    Schedule an appointment as soon as possible for a visit  with Dominica Severin, MD.   Specialty: Orthopedic Surgery Contact information: [address] [place] [address] [place] [address] [phone]               OTC analgesics if needed.  Reviewed expectations re: course of current medical issues. Questions answered. Outlined signs and symptoms indicating need for more acute intervention. Patient verbalized understanding. After Visit Summary given.  SUBJECTIVE: History from: patient. Kevin [name] is a 54 y.o. male who reports gradual onset of pain and eventually "locking up" of R 3d finger. "Gets stuck and then I have to push it to straighten it out." No extremity sensation changes or weakness. No specific injury reported. L-hand dominant.   Past Surgical History:  Procedure Laterality Date  . BACK SURGERY    . LAPAROSCOPIC GASTRIC BANDING        OBJECTIVE:  Vitals:   04/09/20 1729  BP: (!) 149/96  Pulse: 102  Resp: 16  Temp: 98.5 F (36.9 C)  TempSrc: Oral  SpO2: 98%    General appearance: alert; no distress HEENT: [place]; AT Neck: supple with FROM Resp: unlabored respirations Extremities: . R 3rd finger with some catching on flexion; inability to fully flex; no swelling CV: brisk extremity capillary refill of RUE; 2+ radial pulse of RUE. Skin: warm and dry; no visible rashes Neurologic: gait normal; normal sensation of RUE Psychological: alert and cooperative; normal mood and affect  Imaging: DG Finger Middle Right  Result Date: 04/09/2020 CLINICAL DATA:  Pain.  No known injury. EXAM: RIGHT MIDDLE FINGER 2+V COMPARISON:  None. FINDINGS: There is no evidence of fracture or dislocation. There is no evidence of  arthropathy or other focal bone abnormality. Soft tissues are unremarkable. IMPRESSION: Negative. Electronically Signed   By: Bary Richard M.D.   On: 04/09/2020 17:57      No Known Allergies  Past Medical History:  Diagnosis Date  . Diabetes mellitus without complication (HCC)   . Hypertension   . MRSA (methicillin resistant Staphylococcus aureus)    Social History   Socioeconomic History  . Marital status: Divorced    Spouse name: Not on file  . Number of children: Not on file  . Years of education: Not on file  . Highest education level: Not on file  Occupational History  . Not on file  Tobacco Use  . Smoking status: Former Smoker    Quit date: 2000    Years since quitting: 21.6  . Smokeless tobacco: Never Used  Vaping Use  . Vaping Use: Never used  Substance and Sexual Activity  . Alcohol use: No  . Drug use: No  . Sexual activity: Yes  Other Topics Concern  . Not on file  Social History Narrative  . Not on file   Social Determinants of Health   Financial Resource Strain:   . Difficulty of Paying Living Expenses:   Food Insecurity:   . Worried About Programme researcher, broadcasting/film/video in the Last Year:   . Barista in the Last Year:   Transportation Needs:   . Freight forwarder (Medical):   Marland Kitchen Lack of Transportation (Non-Medical):   Physical Activity:   . Days  of Exercise per Week:   . Minutes of Exercise per Session:   Stress:   . Feeling of Stress :   Social Connections:   . Frequency of Communication with Friends and Family:   . Frequency of Social Gatherings with Friends and Family:   . Attends Religious Services:   . Active Member of Clubs or Organizations:   . Attends Banker Meetings:   Marland Kitchen Marital Status:    Family History  Problem Relation Age of Onset  . Heart disease Mother   . Hypertension Mother   . Diabetes Sister   . Hypertension Sister   . Stroke Brother   . Diabetes Brother   . Heart disease Brother   . Heart attack  Brother   . Hypertension Brother   . Hypertension Father    Past Surgical History:  Procedure Laterality Date  . BACK SURGERY    . LAPAROSCOPIC GASTRIC Domingo Pulse, MD 04/11/20 [phone]

## 2020-04-20 ENCOUNTER — Encounter: Payer: Self-pay | Admitting: Nurse Practitioner

## 2020-04-20 ENCOUNTER — Ambulatory Visit: Payer: Self-pay | Admitting: Orthopaedic Surgery

## 2020-04-20 DIAGNOSIS — U071 COVID-19: Secondary | ICD-10-CM

## 2020-04-20 HISTORY — DX: COVID-19: U07.1

## 2020-04-21 ENCOUNTER — Encounter: Payer: Self-pay | Admitting: Nurse Practitioner

## 2020-04-28 ENCOUNTER — Other Ambulatory Visit: Payer: Self-pay

## 2020-04-28 ENCOUNTER — Encounter (HOSPITAL_COMMUNITY): Payer: Self-pay

## 2020-04-28 ENCOUNTER — Ambulatory Visit (HOSPITAL_COMMUNITY)
Admission: RE | Admit: 2020-04-28 | Discharge: 2020-04-28 | Disposition: A | Discharge status: Home / Self Care | Payer: Self-pay | Source: Ambulatory Visit | Attending: Nurse Practitioner | Admitting: Nurse Practitioner

## 2020-04-28 VITALS — BP 157/94 | HR 88 | Temp 97.9°F | Resp 17

## 2020-04-28 DIAGNOSIS — H938X2 Other specified disorders of left ear: Secondary | ICD-10-CM

## 2020-04-28 DIAGNOSIS — H6122 Impacted cerumen, left ear: Secondary | ICD-10-CM

## 2020-04-28 NOTE — ED Triage Notes (Signed)
Pt presents for ear wax removal. States his left ear is clogged and having hearing decreased in the left ear.

## 2020-04-28 NOTE — ED Provider Notes (Signed)
[place]   MRN: [id number] DOB: June 17, 1966  Subjective:   Kevin [name] is a 54 y.o. male presenting for acute onset this morning of left ear popping, sensation of recurrent ear wax build up. Last had this irrigated ~6 months ago. Denies fever, sinus pain, throat pain, coughing. Sx were precipitated by sneezing and blowing his nose. Denies tinnitus, ear drainage.   No current facility-administered medications for this encounter.  Current Outpatient Medications:  .  acetaminophen (TYLENOL) 500 MG tablet, Take 3,000-4,000 mg by mouth every 4 (four) hours as needed for moderate pain, fever or headache. , Disp: , Rfl:  .  amLODipine (NORVASC) 5 MG tablet, Take 1 tablet (5 mg total) by mouth daily. May take an extra 1/2 tablet (2.5 mg) for systolic greater than 782, Disp: 90 tablet, Rfl: 3 .  atorvastatin (LIPITOR) 40 MG tablet, Take 1 tablet (40 mg total) by mouth daily., Disp: 90 tablet, Rfl: 3 .  Blood Glucose Monitoring Suppl (TRUE METRIX METER) w/Device KIT, Use as instructed, Disp: 1 kit, Rfl: 0 .  enalapril (VASOTEC) 20 MG tablet, Take 2 tablets (40 mg total) by mouth daily. Please fill as 90 day supply, Disp: 180 tablet, Rfl: 1 .  glimepiride (AMARYL) 4 MG tablet, Take 1 tablet (4 mg total) by mouth daily before breakfast. Please fill as a 90 day supply, Disp: 90 tablet, Rfl: 1 .  glucose blood (TRUE METRIX BLOOD GLUCOSE TEST) test strip, Use as instructed. Check blood glucose level by fingerstick twice per day., Disp: 200 each, Rfl: 12 .  omeprazole (PRILOSEC) 20 MG capsule, Take 1 capsule (20 mg total) by mouth 2 (two) times daily before a meal. Please fill as a 90 day supply, Disp: 180 capsule, Rfl: 3 .  pneumococcal 23 valent vaccine (PNEUMOVAX 23) 25 MCG/0.5ML injection, To be administered by the Pharmacist (Patient not taking: Reported on 03/02/2020), Disp: 0.5 mL, Rfl: 0 .  sitaGLIPtin-metformin (JANUMET) 50-1000 MG tablet, TAKE 1 TABLET BY MOUTH 2 (TWO) TIMES DAILY WITH A  MEAL. Please fill as 90 day supply, Disp: 180 tablet, Rfl: 1 .  TRUEplus Lancets 28G MISC, Use as instructed. Check blood glucose level by fingerstick twice per day., Disp: 200 each, Rfl: 6   No Known Allergies  Past Medical History:  Diagnosis Date  . Diabetes mellitus without complication ([place])   . Hypertension   . MRSA (methicillin resistant Staphylococcus aureus)      Past Surgical History:  Procedure Laterality Date  . BACK SURGERY    . LAPAROSCOPIC GASTRIC BANDING      Family History  Problem Relation Age of Onset  . Heart disease Mother   . Hypertension Mother   . Diabetes Sister   . Hypertension Sister   . Stroke Brother   . Diabetes Brother   . Heart disease Brother   . Heart attack Brother   . Hypertension Brother   . Hypertension Father     Social History   Tobacco Use  . Smoking status: Former Smoker    Quit date: 2000    Years since quitting: 21.6  . Smokeless tobacco: Never Used  Vaping Use  . Vaping Use: Never used  Substance Use Topics  . Alcohol use: No  . Drug use: No    ROS   Objective:   Vitals: BP (!) 157/94 (BP Location: Left Arm)   Pulse 88   Temp 97.9 F (36.6 C) (Oral)   Resp 17   SpO2 98%   Physical Exam  Constitutional:      General: He is not in acute distress.    Appearance: Normal appearance. He is well-developed and normal weight. He is not ill-appearing, toxic-appearing or diaphoretic.  HENT:     Head: Normocephalic and atraumatic.     Right Ear: Tympanic membrane, ear canal and external ear normal. There is no impacted cerumen.     Left Ear: Tympanic membrane, ear canal and external ear normal. There is impacted cerumen (partially occluding TM).     Nose: Nose normal.     Mouth/Throat:     Pharynx: Oropharynx is clear.  Eyes:     General: No scleral icterus.       Right eye: No discharge.        Left eye: No discharge.     Extraocular Movements: Extraocular movements intact.     Pupils: Pupils are equal, round,  and reactive to light.  Cardiovascular:     Rate and Rhythm: Normal rate.  Pulmonary:     Effort: Pulmonary effort is normal.  Musculoskeletal:     Cervical back: Normal range of motion.  Neurological:     Mental Status: He is alert and oriented to person, place, and time.  Psychiatric:        Mood and Affect: Mood normal.        Behavior: Behavior normal.        Thought Content: Thought content normal.        Judgment: Judgment normal.     ~1/2cc cerumen removed using curette in left ear.  Assessment and Plan :   PDMP not reviewed this encounter.  1. Impacted cerumen of left ear   2. Ear fullness, left     Successful cerumen removal from left ear.  General management of cerumen impaction reviewed with patient.  Anticipatory guidance provided. Counseled patient on potential for adverse effects with medications prescribed/recommended today, ER and return-to-clinic precautions discussed, patient verbalized understanding.    Jaynee Eagles, PA-C 04/28/20 1211

## 2020-05-10 ENCOUNTER — Ambulatory Visit (INDEPENDENT_AMBULATORY_CARE_PROVIDER_SITE_OTHER): Payer: Self-pay | Admitting: Orthopaedic Surgery

## 2020-05-10 ENCOUNTER — Encounter: Payer: Self-pay | Admitting: Orthopaedic Surgery

## 2020-05-10 VITALS — Ht 67.0 in | Wt 174.0 lb

## 2020-05-10 DIAGNOSIS — M19049 Primary osteoarthritis, unspecified hand: Secondary | ICD-10-CM

## 2020-05-10 DIAGNOSIS — M65331 Trigger finger, right middle finger: Secondary | ICD-10-CM

## 2020-05-10 MED ORDER — LIDOCAINE HCL 1 % IJ SOLN
1.0000 mL | INTRAMUSCULAR | Status: AC | PRN
  Administered 2020-05-10: 1 mL

## 2020-05-10 MED ORDER — BUPIVACAINE HCL 0.25 % IJ SOLN
0.3300 mL | INTRAMUSCULAR | Status: AC | PRN
  Administered 2020-05-10: .33 mL

## 2020-05-10 MED ORDER — METHYLPREDNISOLONE ACETATE 40 MG/ML IJ SUSP
13.3300 mg | INTRAMUSCULAR | Status: AC | PRN
  Administered 2020-05-10: 13.33 mg

## 2020-05-10 MED ORDER — DICLOFENAC SODIUM 1 % EX GEL: 2.0000 g | Freq: Four times a day (QID) | CUTANEOUS | 0 refills | Status: DC

## 2020-05-10 NOTE — Progress Notes (Signed)
Office Visit Note   Patient: Kevin [name]           Date of Birth: 1966-05-24           MRN: [id number] Visit Date: 05/10/2020              Requested by: Claiborne Rigg, NP [address] [place],  [place] [address] PCP: Claiborne Rigg, NP   Assessment & Plan: Visit Diagnoses:  1. Arthritis of finger   2. Trigger finger, right middle finger     Plan: Impression is right long trigger finger and inflammatory response following the injury.  Patient does note that he currently has gastritis and is unable to take NSAIDs.  We have called in Voltaren gel to use over the finger as needed.  I have also injected the right long trigger finger today.  He will follow up with [place] as needed.  Follow-Up Instructions: Return in about 6 weeks (around 06/21/2020).   Orders:  Orders Placed This Encounter  Procedures  . Hand/UE Inj: R long A1   Meds ordered this encounter  Medications  . diclofenac Sodium (VOLTAREN) 1 % GEL    Sig: Apply 2 g topically 4 (four) times daily.    Dispense:  150 g    Refill:  0      Procedures: Hand/UE Inj: R long A1 for trigger finger on 05/10/2020 9:36 AM Indications: pain Details: 25 G needle Medications: 1 mL lidocaine 1 %; 0.33 mL bupivacaine 0.25 %; 13.33 mg methylPREDNISolone acetate 40 MG/ML      Clinical Data: No additional findings.   Subjective: Chief Complaint  Patient presents with  . Right Hand - Pain    Middle finger    HPI patient is a 54 year old left-hand-dominant gentleman who comes in today with concerns about his right long finger.  Approximately 3 months ago, he fell hitting the palm of his hand.  He did not notice any deformity until a few weeks after this injury.  He started to notice pain, decreased extension and occasional triggering to the right long finger.  He has had significant pain and swelling to the PIP joint as well.  He was seen in the ED for this where x-rays were obtained.  These were negative for fracture or  other acute findings.  He comes in today for further evaluation and treatment recommendation.  Of note, he does have a history of trigger finger on the left hand.  Review of Systems as detailed in HPI.  All others reviewed and are negative.   Objective: Vital Signs: Ht 5\' 7"  (1.702 m)   Wt 174 lb (78.9 kg)   BMI 27.25 kg/m   Physical Exam well-developed well-nourished gentleman in no acute distress.  Alert and oriented x3.  Ortho Exam examination of his right hand reveals moderate pain and tenderness to the dorsum of the PIP joint.  He is unable to fully extend the finger at the PIP joint. There does not appear to be a central slip injury.  He does have reproducible triggering.  He has moderate tenderness at the A1 pulley of the long finger.  He is neurovascular intact distally.  Specialty Comments:  No specialty comments available.  Imaging: No new imaging   PMFS History: Patient Active Problem List   Diagnosis Date Noted  . Toe pain, chronic, left 10/07/2018  . Hyperlipidemia associated with type 2 diabetes mellitus (HCC) 09/19/2018  . Hyperlipidemia 03/18/2018  . Dyspnea on exertion 03/18/2018  .  Chest pain 03/18/2018  . Diabetes mellitus without complication (HCC) 11/11/2017  . Essential hypertension 11/11/2017   Past Medical History:  Diagnosis Date  . Diabetes mellitus without complication (HCC)   . Hypertension   . MRSA (methicillin resistant Staphylococcus aureus)     Family History  Problem Relation Age of Onset  . Heart disease Mother   . Hypertension Mother   . Diabetes Sister   . Hypertension Sister   . Stroke Brother   . Diabetes Brother   . Heart disease Brother   . Heart attack Brother   . Hypertension Brother   . Hypertension Father     Past Surgical History:  Procedure Laterality Date  . BACK SURGERY    . LAPAROSCOPIC GASTRIC BANDING     Social History   Occupational History  . Not on file  Tobacco Use  . Smoking status: Former Smoker     Quit date: 2000    Years since quitting: 21.6  . Smokeless tobacco: Never Used  Vaping Use  . Vaping Use: Never used  Substance and Sexual Activity  . Alcohol use: No  . Drug use: No  . Sexual activity: Yes

## 2020-05-27 ENCOUNTER — Other Ambulatory Visit: Payer: Self-pay | Admitting: Nurse Practitioner

## 2020-05-27 ENCOUNTER — Encounter: Payer: Self-pay | Admitting: Nurse Practitioner

## 2020-05-27 ENCOUNTER — Other Ambulatory Visit: Payer: Self-pay

## 2020-05-27 ENCOUNTER — Ambulatory Visit: Payer: Self-pay | Attending: Nurse Practitioner | Admitting: Nurse Practitioner

## 2020-05-27 VITALS — BP 149/94 | HR 93 | Temp 97.7°F | Ht 67.0 in | Wt 172.0 lb

## 2020-05-27 DIAGNOSIS — E785 Hyperlipidemia, unspecified: Secondary | ICD-10-CM

## 2020-05-27 DIAGNOSIS — E118 Type 2 diabetes mellitus with unspecified complications: Secondary | ICD-10-CM

## 2020-05-27 DIAGNOSIS — Z1159 Encounter for screening for other viral diseases: Secondary | ICD-10-CM

## 2020-05-27 DIAGNOSIS — Z23 Encounter for immunization: Secondary | ICD-10-CM

## 2020-05-27 DIAGNOSIS — I10 Essential (primary) hypertension: Secondary | ICD-10-CM

## 2020-05-27 LAB — GLUCOSE, POCT (MANUAL RESULT ENTRY): POC Glucose: 118 mg/dl — AB (ref 70–99)

## 2020-05-27 LAB — POCT GLYCOSYLATED HEMOGLOBIN (HGB A1C): Hemoglobin A1C: 5.8 % — AB (ref 4.0–5.6)

## 2020-05-27 MED ORDER — ENALAPRIL MALEATE 20 MG PO TABS: 40.0000 mg | ORAL_TABLET | Freq: Every day | ORAL | 1 refills | Status: DC

## 2020-05-27 NOTE — Progress Notes (Signed)
Assessment & Plan:  Kevin [name] was seen today for follow-up.  Diagnoses and all orders for this visit:  Type 2 diabetes mellitus with complication, without long-term current use of insulin (HCC) -     Glucose (CBG) -     HgB A1c -     Basic metabolic panel Continue blood sugar control as discussed in office today, low carbohydrate diet, and regular physical exercise as tolerated, 150 minutes per week (30 min each day, 5 days per week, or 50 min 3 days per week). Keep blood sugar logs with fasting goal of 90-130 mg/dl, post prandial (after you eat) less than 180.  For Hypoglycemia: BS <60 and Hyperglycemia BS >400; contact the clinic ASAP. Annual eye exams and foot exams are recommended.   Essential hypertension -     enalapril (VASOTEC) 20 MG tablet; Take 2 tablets (40 mg total) by mouth daily. Please fill as 90 day supply Continue all antihypertensives as prescribed.  Remember to bring in your blood pressure log with you for your follow up appointment.  DASH/Mediterranean Diets are healthier choices for HTN.   Dyslipidemia, goal LDL below 70 INSTRUCTIONS: Work on a low fat, heart healthy diet and participate in regular aerobic exercise program by working out at least 150 minutes per week; 5 days a week-30 minutes per day. Avoid red meat/beef/steak,  fried foods. junk foods, sodas, sugary drinks, unhealthy snacking, alcohol and smoking.  Drink at least 80 oz of water per day and monitor your carbohydrate intake daily.    Need for hepatitis C screening test -     Hepatitis C Antibody  Need for immunization against influenza -     Flu Vaccine QUAD 6+ mos PF IM (Fluarix Quad PF)    Patient has been counseled on age-appropriate routine health concerns for screening and prevention. These are reviewed and up-to-date. Referrals have been placed accordingly. Immunizations are up-to-date or declined.    Subjective:   Chief Complaint  Patient presents with  . Follow-up    Pt. is here for  diabetes F.U.    HPI Kevin [name] 54 y.o. male presents to office today for follow up. Notes intermittent right shoulder neuropathy and myalgia that radiates to the the forearm with onset of symptoms occuring after being diagnosed with COVID last month. Other associated symptoms include: Worsening dyspnea 2 weeks ago which has now decreased in severity.   DM TYPE 2 Well controlled. Weight is down significantly. Taking amaryl 4 mg daily, janumet 50-1000 mg BID as prescribed. LDL nearing goal. Taking atorvastatin 40 mg daily as prescribed. Denies statin intolerance.  Lab Results  Component Value Date   HGBA1C 5.8 (A) 05/27/2020    Lab Results  Component Value Date   LDLCALC 80 11/24/2019   Essential Hypertension Blood pressures are well controlled based on home readings. He is taking enalapril 40 mg daily and amlodipine 5 mg daily as prescribed. Denies worsening chest pain,  palpitations, lightheadedness, dizziness, headaches or BLE edema.  BP Readings from Last 3 Encounters:  05/27/20 (!) 149/94  04/28/20 (!) 157/94  04/09/20 (!) 149/96     Review of Systems  Constitutional: Negative for fever, malaise/fatigue and weight loss.  HENT: Negative.  Negative for nosebleeds.   Eyes: Negative.  Negative for blurred vision, double vision and photophobia.  Respiratory: Positive for shortness of breath. Negative for cough, hemoptysis, sputum production and wheezing.   Cardiovascular: Negative.  Negative for chest pain, palpitations and leg swelling.  Gastrointestinal: Negative.  Negative for  heartburn, nausea and vomiting.  Musculoskeletal: Negative.  Negative for myalgias.  Neurological: Negative.  Negative for dizziness, focal weakness, seizures and headaches.  Psychiatric/Behavioral: Negative.  Negative for suicidal ideas.    Past Medical History:  Diagnosis Date  . Diabetes mellitus without complication ([place])   . Hypertension   . MRSA (methicillin resistant Staphylococcus aureus)      Past Surgical History:  Procedure Laterality Date  . BACK SURGERY    . LAPAROSCOPIC GASTRIC BANDING      Family History  Problem Relation Age of Onset  . Heart disease Mother   . Hypertension Mother   . Diabetes Sister   . Hypertension Sister   . Stroke Brother   . Diabetes Brother   . Heart disease Brother   . Heart attack Brother   . Hypertension Brother   . Hypertension Father     Social History Reviewed with no changes to be made today.   Outpatient Medications Prior to Visit  Medication Sig Dispense Refill  . acetaminophen (TYLENOL) 500 MG tablet Take 3,000-4,000 mg by mouth every 4 (four) hours as needed for moderate pain, fever or headache.     Marland Kitchen amLODipine (NORVASC) 5 MG tablet Take 1 tablet (5 mg total) by mouth daily. May take an extra 1/2 tablet (2.5 mg) for systolic greater than 528 90 tablet 3  . atorvastatin (LIPITOR) 40 MG tablet Take 1 tablet (40 mg total) by mouth daily. 90 tablet 3  . Blood Glucose Monitoring Suppl (TRUE METRIX METER) w/Device KIT Use as instructed 1 kit 0  . diclofenac Sodium (VOLTAREN) 1 % GEL Apply 2 g topically 4 (four) times daily. 150 g 0  . glimepiride (AMARYL) 4 MG tablet Take 1 tablet (4 mg total) by mouth daily before breakfast. Please fill as a 90 day supply 90 tablet 1  . glucose blood (TRUE METRIX BLOOD GLUCOSE TEST) test strip Use as instructed. Check blood glucose level by fingerstick twice per day. 200 each 12  . omeprazole (PRILOSEC) 20 MG capsule Take 1 capsule (20 mg total) by mouth 2 (two) times daily before a meal. Please fill as a 90 day supply 180 capsule 3  . pneumococcal 23 valent vaccine (PNEUMOVAX 23) 25 MCG/0.5ML injection To be administered by the Pharmacist 0.5 mL 0  . sitaGLIPtin-metformin (JANUMET) 50-1000 MG tablet TAKE 1 TABLET BY MOUTH 2 (TWO) TIMES DAILY WITH A MEAL. Please fill as 90 day supply 180 tablet 1  . TRUEplus Lancets 28G MISC Use as instructed. Check blood glucose level by fingerstick twice per  day. 200 each 6  . enalapril (VASOTEC) 20 MG tablet Take 2 tablets (40 mg total) by mouth daily. Please fill as 90 day supply 180 tablet 1   No facility-administered medications prior to visit.    No Known Allergies     Objective:    BP (!) 149/94 (BP Location: Left Arm, Patient Position: Sitting, Cuff Size: Normal)   Pulse 93   Temp 97.7 F (36.5 C) (Temporal)   Ht 5' 7" (1.702 m)   Wt 172 lb (78 kg)   SpO2 96%   BMI 26.94 kg/m  Wt Readings from Last 3 Encounters:  05/27/20 172 lb (78 kg)  05/10/20 174 lb (78.9 kg)  01/19/20 190 lb (86.2 kg)    Physical Exam Vitals and nursing note reviewed.  Constitutional:      Appearance: He is well-developed.  HENT:     Head: Normocephalic and atraumatic.  Cardiovascular:  Rate and Rhythm: Normal rate and regular rhythm.     Heart sounds: Normal heart sounds. No murmur heard.  No friction rub. No gallop.   Pulmonary:     Effort: Pulmonary effort is normal. No tachypnea or respiratory distress.     Breath sounds: Examination of the right-upper field reveals decreased breath sounds. Decreased breath sounds present. No wheezing, rhonchi or rales.  Chest:     Chest wall: No tenderness.  Abdominal:     General: Bowel sounds are normal.     Palpations: Abdomen is soft.  Musculoskeletal:        General: Normal range of motion.     Cervical back: Normal range of motion.  Skin:    General: Skin is warm and dry.  Neurological:     Mental Status: He is alert and oriented to person, place, and time.     Coordination: Coordination normal.  Psychiatric:        Behavior: Behavior normal. Behavior is cooperative.        Thought Content: Thought content normal.        Judgment: Judgment normal.          Patient has been counseled extensively about nutrition and exercise as well as the importance of adherence with medications and regular follow-up. The patient was given clear instructions to go to ER or return to medical center if  symptoms don't improve, worsen or new problems develop. The patient verbalized understanding.   Follow-up: Return in about 3 months (around 08/26/2020) for IN OFFICE APPT.   [name], FNP-BC [facility], [place]   05/27/2020, 8:48 PM

## 2020-05-28 LAB — BASIC METABOLIC PANEL
BUN/Creatinine Ratio: 12 (ref 9–20)
BUN: 12 mg/dL (ref 6–24)
CO2: 22 mmol/L (ref 20–29)
Calcium: 9.8 mg/dL (ref 8.7–10.2)
Chloride: 97 mmol/L (ref 96–106)
Creatinine, Ser: 1.04 mg/dL (ref 0.76–1.27)
GFR calc Af Amer: 94 mL/min/{1.73_m2} (ref >59)
GFR calc non Af Amer: 81 mL/min/{1.73_m2} (ref >59)
Glucose: 107 mg/dL — ABNORMAL HIGH (ref 65–99)
Potassium: 5.3 mmol/L — ABNORMAL HIGH (ref 3.5–5.2)
Sodium: 134 mmol/L (ref 134–144)

## 2020-05-28 LAB — HEPATITIS C ANTIBODY: Hep C Virus Ab: <0.1 s/co ratio (ref 0.0–0.9)

## 2020-05-31 ENCOUNTER — Encounter: Payer: Self-pay | Admitting: Nurse Practitioner

## 2020-05-31 MED FILL — GLIMEPIRIDE 4 MG TABS: 4 | 90 days supply | Qty: 90 | Fill #0

## 2020-05-31 MED FILL — ENALAPRIL MALEATE 20 MG TAB: 20 | 90 days supply | Qty: 180 | Fill #0

## 2020-05-31 MED FILL — OMEPRAZOLE 20 MG CAP: 20 | 90 days supply | Qty: 180 | Fill #1

## 2020-05-31 MED FILL — AMLODIPINE BESYLATE 5 MG TA: 5 | 60 days supply | Qty: 90 | Fill #2

## 2020-06-06 MED FILL — ATORVASTATIN CALCIUM 40 MG: 40 | 90 days supply | Qty: 90 | Fill #1

## 2020-06-07 ENCOUNTER — Telehealth: Payer: Self-pay | Admitting: Nurse Practitioner

## 2020-06-07 NOTE — Telephone Encounter (Signed)
Copied from CRM [id number]. Topic: Appointment Scheduling - Scheduling Inquiry for Clinic >> Jun 07, 2020  1:08 PM Leary Roca wrote: Reason for CRM: pt is requesting blood work from lab . Please advise

## 2020-06-08 NOTE — Telephone Encounter (Signed)
Attempt to call patient several times to relay lab results. No answer, unable to leave voicemail.

## 2020-06-09 ENCOUNTER — Encounter: Payer: Self-pay | Admitting: Nurse Practitioner

## 2020-06-13 ENCOUNTER — Other Ambulatory Visit: Payer: Self-pay | Admitting: Nurse Practitioner

## 2020-06-14 NOTE — Telephone Encounter (Signed)
[employer] letter mailed to address on file and sent via MyChart

## 2020-07-29 ENCOUNTER — Telehealth: Payer: Self-pay | Admitting: Nurse Practitioner

## 2020-07-29 NOTE — Telephone Encounter (Signed)
Patient is calling to schedule an appt for recertification for CAFA. Patient is looking to book 12/6/-08/19/20 at 10:00am or after, before 2:00p in the afternoon. if possible CB- [phone]

## 2020-08-01 NOTE — Telephone Encounter (Signed)
I return Pt call, he was inform that he need to schedule the PCP appt them call me back

## 2020-08-09 ENCOUNTER — Encounter: Payer: Self-pay | Admitting: Orthopaedic Surgery

## 2020-08-09 ENCOUNTER — Ambulatory Visit (INDEPENDENT_AMBULATORY_CARE_PROVIDER_SITE_OTHER): Payer: Self-pay | Admitting: Orthopaedic Surgery

## 2020-08-09 VITALS — Ht 67.0 in | Wt 172.0 lb

## 2020-08-09 DIAGNOSIS — M65331 Trigger finger, right middle finger: Secondary | ICD-10-CM

## 2020-08-09 NOTE — Progress Notes (Signed)
Office Visit Note   Patient: Kevin [name]           Date of Birth: November 10, 1965           MRN: [id number] Visit Date: 08/09/2020              Requested by: Claiborne Rigg, NP [address] [place],  [place] [address] PCP: Claiborne Rigg, NP   Assessment & Plan: Visit Diagnoses:  1. Trigger finger, right middle finger     Plan: Impression is recurrent right long trigger finger.  We have discussed repeat cortisone injection versus surgical intervention to include A1 pulley release.  He would like to proceed with right long finger A1 pulley release.  Risk, benefits and poss complications reviewed.  Rehab recovery time discussed.  All questions answered.  His last hemoglobin A1c was 5.8 on 05/27/2020.  Total face to face encounter time was greater than 25 minutes and over half of this time was spent in counseling and/or coordination of care.  Follow-Up Instructions: Return for post-op.   Orders:  No orders of the defined types were placed in this encounter.  No orders of the defined types were placed in this encounter.     Procedures: No procedures performed   Clinical Data: No additional findings.   Subjective: Chief Complaint  Patient presents with  . Right Hand - Pain, Follow-up    HPI pleasant 54 year old left-hand-dominant gentleman who comes in today with recurrent pain and triggering to the right long finger.  This began several months ago after falling on the palm of his hand going down a set of stairs.  He was seen in our office where it was noted that he had pain and swelling to the PIP joint as well as reproducible triggering to the right long finger.  Trigger finger injection was performed which helped about 75 to 80% and lasted for about 2-1/2 months.  His symptoms have started to return and are starting to worsen. Of note, the patient is a diabetic.  Review of Systems as detailed in HPI.  All others reviewed and are negative.   Objective: Vital Signs: Ht  5\' 7"  (1.702 m)   Wt 172 lb (78 kg)   BMI 26.94 kg/m   Physical Exam well-developed well-nourished gentleman in no acute distress.  Alert and oriented x3.  Ortho Exam right long finger has moderate tenderness in the palpable nodule at the A1 pulley.  He does have reproducible triggering.  He is unable to fully extend the PIP joint.  He is neurovascularly intact distally.  Decreased arc of motion of the long finger secondary to pain.  Specialty Comments:  No specialty comments available.  Imaging: No new imaging   PMFS History: Patient Active Problem List   Diagnosis Date Noted  . Toe pain, chronic, left 10/07/2018  . Hyperlipidemia associated with type 2 diabetes mellitus (HCC) 09/19/2018  . Hyperlipidemia 03/18/2018  . Dyspnea on exertion 03/18/2018  . Chest pain 03/18/2018  . Diabetes mellitus without complication (HCC) 11/11/2017  . Essential hypertension 11/11/2017   Past Medical History:  Diagnosis Date  . Diabetes mellitus without complication (HCC)   . Hypertension   . MRSA (methicillin resistant Staphylococcus aureus)     Family History  Problem Relation Age of Onset  . Heart disease Mother   . Hypertension Mother   . Diabetes Sister   . Hypertension Sister   . Stroke Brother   . Diabetes Brother   . Heart disease  Brother   . Heart attack Brother   . Hypertension Brother   . Hypertension Father     Past Surgical History:  Procedure Laterality Date  . BACK SURGERY    . LAPAROSCOPIC GASTRIC BANDING     Social History   Occupational History  . Not on file  Tobacco Use  . Smoking status: Former Smoker    Quit date: 2000    Years since quitting: 21.9  . Smokeless tobacco: Never Used  Vaping Use  . Vaping Use: Never used  Substance and Sexual Activity  . Alcohol use: No  . Drug use: No  . Sexual activity: Yes

## 2020-08-23 ENCOUNTER — Ambulatory Visit: Payer: Self-pay | Attending: Nurse Practitioner

## 2020-08-23 ENCOUNTER — Other Ambulatory Visit: Payer: Self-pay

## 2020-08-27 ENCOUNTER — Inpatient Hospital Stay (HOSPITAL_COMMUNITY): Admission: RE | Admit: 2020-08-27 | Payer: Self-pay | Source: Ambulatory Visit

## 2020-08-29 MED FILL — ENALAPRIL MALEATE 20 MG TAB: 20 | 90 days supply | Qty: 180 | Fill #1

## 2020-08-29 MED FILL — GLIMEPIRIDE 4 MG TABS: 4 | 90 days supply | Qty: 90 | Fill #1

## 2020-09-06 MED FILL — ATORVASTATIN CALCIUM 40 MG: 40 | 90 days supply | Qty: 90 | Fill #2

## 2020-09-19 MED FILL — OMEPRAZOLE 20 MG CAP: 20 | 90 days supply | Qty: 180 | Fill #2

## 2020-09-21 ENCOUNTER — Ambulatory Visit: Payer: Self-pay | Attending: Nurse Practitioner | Admitting: Nurse Practitioner

## 2020-09-21 ENCOUNTER — Other Ambulatory Visit: Payer: Self-pay

## 2020-09-21 ENCOUNTER — Other Ambulatory Visit: Payer: Self-pay | Admitting: Nurse Practitioner

## 2020-09-21 ENCOUNTER — Encounter: Payer: Self-pay | Admitting: Nurse Practitioner

## 2020-09-21 VITALS — BP 117/75 | HR 98 | Temp 98.5°F | Ht 67.0 in | Wt 186.0 lb

## 2020-09-21 DIAGNOSIS — E785 Hyperlipidemia, unspecified: Secondary | ICD-10-CM

## 2020-09-21 DIAGNOSIS — G8929 Other chronic pain: Secondary | ICD-10-CM

## 2020-09-21 DIAGNOSIS — E1169 Type 2 diabetes mellitus with other specified complication: Secondary | ICD-10-CM

## 2020-09-21 DIAGNOSIS — I10 Essential (primary) hypertension: Secondary | ICD-10-CM

## 2020-09-21 DIAGNOSIS — Z8719 Personal history of other diseases of the digestive system: Secondary | ICD-10-CM

## 2020-09-21 DIAGNOSIS — E118 Type 2 diabetes mellitus with unspecified complications: Secondary | ICD-10-CM

## 2020-09-21 DIAGNOSIS — Z1211 Encounter for screening for malignant neoplasm of colon: Secondary | ICD-10-CM

## 2020-09-21 DIAGNOSIS — M25561 Pain in right knee: Secondary | ICD-10-CM

## 2020-09-21 LAB — GLUCOSE, POCT (MANUAL RESULT ENTRY): POC Glucose: 95 mg/dl (ref 70–99)

## 2020-09-21 MED ORDER — OMEPRAZOLE 20 MG PO CPDR: 20.0000 mg | DELAYED_RELEASE_CAPSULE | Freq: Two times a day (BID) | ORAL | 3 refills | Status: DC

## 2020-09-21 MED ORDER — ACETAMINOPHEN-CODEINE #3 300-30 MG PO TABS: 1.0000 | ORAL_TABLET | Freq: Three times a day (TID) | ORAL | 0 refills | Status: DC | PRN

## 2020-09-21 MED ORDER — GLIMEPIRIDE 4 MG PO TABS: 4.0000 mg | ORAL_TABLET | Freq: Every day | ORAL | 1 refills | Status: DC

## 2020-09-21 MED ORDER — AMLODIPINE BESYLATE 5 MG PO TABS: 5.0000 mg | ORAL_TABLET | Freq: Every day | ORAL | 3 refills | Status: DC

## 2020-09-21 MED ORDER — ATORVASTATIN CALCIUM 40 MG PO TABS: 40.0000 mg | ORAL_TABLET | Freq: Every day | ORAL | 3 refills | Status: DC

## 2020-09-21 MED ORDER — ENALAPRIL MALEATE 20 MG PO TABS: 40.0000 mg | ORAL_TABLET | Freq: Every day | ORAL | 1 refills | Status: DC

## 2020-09-21 MED ORDER — JANUMET 50-1000 MG PO TABS
ORAL_TABLET | ORAL | 1 refills | Status: DC
Start: 2020-09-21 — End: 2020-09-21

## 2020-09-21 MED FILL — AMLODIPINE BESYLATE 5 MG TA: 5 | 60 days supply | Qty: 90 | Fill #0

## 2020-09-21 MED FILL — ACETAMINOPHEN-COD #3 TABLET: 300-30 | 5 days supply | Qty: 30 | Fill #0

## 2020-09-21 NOTE — Progress Notes (Signed)
Assessment & Plan:  Kevin [name] was seen today for follow-up.  Diagnoses and all orders for this visit:  Type 2 diabetes mellitus with complication, without long-term current use of insulin (HCC) -     Glucose (CBG) -     glimepiride (AMARYL) 4 MG tablet; Take 1 tablet (4 mg total) by mouth daily before breakfast. Please fill as a 90 day supply -     sitaGLIPtin-metformin (JANUMET) 50-1000 MG tablet; TAKE 1 TABLET BY MOUTH 2 (TWO) TIMES DAILY WITH A MEAL. Please fill as 90 day supply Continue blood sugar control as discussed in office today, low carbohydrate diet, and regular physical exercise as tolerated, 150 minutes per week (30 min each day, 5 days per week, or 50 min 3 days per week). Keep blood sugar logs with fasting goal of 90-130 mg/dl, post prandial (after you eat) less than 180.  For Hypoglycemia: BS <60 and Hyperglycemia BS >400; contact the clinic ASAP. Annual eye exams and foot exams are recommended.   Colon cancer screening -     Fecal occult blood, imunochemical([facility])  Essential hypertension -     enalapril (VASOTEC) 20 MG tablet; Take 2 tablets (40 mg total) by mouth daily. Please fill as 90 day supply -     amLODipine (NORVASC) 5 MG tablet; Take 1 tablet (5 mg total) by mouth daily. May take an extra 1/2 tablet (2.5 mg) for systolic greater than 329 Continue all antihypertensives as prescribed.  Remember to bring in your blood pressure log with you for your follow up appointment.  DASH/Mediterranean Diets are healthier choices for HTN.    History of gastritis -     omeprazole (PRILOSEC) 20 MG capsule; Take 1 capsule (20 mg total) by mouth 2 (two) times daily before a meal. Please fill as a 90 day supply  Hyperlipidemia associated with type 2 diabetes mellitus (HCC) -     atorvastatin (LIPITOR) 40 MG tablet; Take 1 tablet (40 mg total) by mouth daily. INSTRUCTIONS: Work on a low fat, heart healthy diet and participate in regular aerobic exercise program by  working out at least 150 minutes per week; 5 days a week-30 minutes per day. Avoid red meat/beef/steak,  fried foods. junk foods, sodas, sugary drinks, unhealthy snacking, alcohol and smoking.  Drink at least 80 oz of water per day and monitor your carbohydrate intake daily.    Chronic pain of right knee -     DG Knee Complete 4 Views Right; Future -     acetaminophen-codeine (TYLENOL #3) 300-30 MG tablet; Take 1-2 tablets by mouth every 8 (eight) hours as needed for moderate pain.    Patient has been counseled on age-appropriate routine health concerns for screening and prevention. These are reviewed and up-to-date. Referrals have been placed accordingly. Immunizations are up-to-date or declined.    Subjective:   Chief Complaint  Patient presents with  . Follow-up    Pt. Is here for 3 months follow up. Pt. Stated he is having pain on his right knee been going about a year but worst during 6 months ago.    HPI Kevin [name] 55 y.o. male presents to office today for follow up.  has a past medical history of COVID (04/20/2020), Diabetes mellitus without complication ([place]), Hypertension, and MRSA (methicillin resistant Staphylococcus aureus).   DM2 Too early for A1c today.  Well-controlled with glimepiride 4 mg daily and Janumet 50-1000 mg twice daily. Lab Results  Component Value Date   HGBA1C 5.8 (A) 05/27/2020  Essential Hypertension Well-controlled with amlodipine 5 mg daily and enalapril 40 mg daily. Denies chest pain, worsening shortness of breath, palpitations, lightheadedness, dizziness, headaches or BLE edema.  He was experiencing some shortness of breath after being diagnosed with COVID (04-2020) however over the past few weeks his shortness of breath is slowly improving. BP Readings from Last 3 Encounters:  09/21/20 117/75  05/27/20 (!) 149/94  04/28/20 (!) 157/94    Chronic Knee Pain Onset several years ago with initial x-ray of right knee in 2017.  No acute findings at  that time.  He does endorse extensive walking throughout the day.  Knee pain has been worsening over the past month.  Pain is felt in the posterior knee and described as aching.  Aggravating factors: Walking or standing for prolonged periods of time.  He denies any injury or trauma to the knee.  He is currently seeing Ortho for his right trigger finger and will have release surgery in a few weeks.  We may need to refer him for his knee back to Ortho based on imaging.   Neuropathy He never received a call for work-up of his neuropathy/nerve conduction study.  However today he states his right arm numbness has completely resvoled.   Review of Systems  Constitutional: Negative for fever, malaise/fatigue and weight loss.  HENT: Negative.  Negative for nosebleeds.   Eyes: Negative.  Negative for blurred vision, double vision and photophobia.  Respiratory: Negative.  Negative for cough and shortness of breath.   Cardiovascular: Negative.  Negative for chest pain, palpitations and leg swelling.  Gastrointestinal: Negative.  Negative for heartburn, nausea and vomiting.  Musculoskeletal: Positive for joint pain. Negative for myalgias.  Neurological: Negative.  Negative for dizziness, focal weakness, seizures and headaches.  Psychiatric/Behavioral: Negative.  Negative for suicidal ideas.    Past Medical History:  Diagnosis Date  . COVID 04/20/2020  . Diabetes mellitus without complication ([place])   . Hypertension   . MRSA (methicillin resistant Staphylococcus aureus)     Past Surgical History:  Procedure Laterality Date  . BACK SURGERY    . LAPAROSCOPIC GASTRIC BANDING      Family History  Problem Relation Age of Onset  . Heart disease Mother   . Hypertension Mother   . Diabetes Sister   . Hypertension Sister   . Stroke Brother   . Diabetes Brother   . Heart disease Brother   . Heart attack Brother   . Hypertension Brother   . Hypertension Father     Social History Reviewed with no  changes to be made today.   Outpatient Medications Prior to Visit  Medication Sig Dispense Refill  . Blood Glucose Monitoring Suppl (TRUE METRIX METER) w/Device KIT Use as instructed 1 kit 0  . glucose blood (TRUE METRIX BLOOD GLUCOSE TEST) test strip Use as instructed. Check blood glucose level by fingerstick twice per day. 200 each 12  . pneumococcal 23 valent vaccine (PNEUMOVAX 23) 25 MCG/0.5ML injection To be administered by the Pharmacist 0.5 mL 0  . TRUEplus Lancets 28G MISC Use as instructed. Check blood glucose level by fingerstick twice per day. 200 each 6  . acetaminophen (TYLENOL) 500 MG tablet Take 3,000-4,000 mg by mouth every 4 (four) hours as needed for moderate pain, fever or headache.     Marland Kitchen amLODipine (NORVASC) 5 MG tablet Take 1 tablet (5 mg total) by mouth daily. May take an extra 1/2 tablet (2.5 mg) for systolic greater than 161 90 tablet 3  .  atorvastatin (LIPITOR) 40 MG tablet Take 1 tablet (40 mg total) by mouth daily. 90 tablet 3  . sitaGLIPtin-metformin (JANUMET) 50-1000 MG tablet TAKE 1 TABLET BY MOUTH 2 (TWO) TIMES DAILY WITH A MEAL. Please fill as 90 day supply 180 tablet 1  . diclofenac Sodium (VOLTAREN) 1 % GEL Apply 2 g topically 4 (four) times daily. (Patient not taking: Reported on 09/21/2020) 150 g 0  . enalapril (VASOTEC) 20 MG tablet Take 2 tablets (40 mg total) by mouth daily. Please fill as 90 day supply 180 tablet 1  . glimepiride (AMARYL) 4 MG tablet Take 1 tablet (4 mg total) by mouth daily before breakfast. Please fill as a 90 day supply 90 tablet 1  . omeprazole (PRILOSEC) 20 MG capsule Take 1 capsule (20 mg total) by mouth 2 (two) times daily before a meal. Please fill as a 90 day supply 180 capsule 3   No facility-administered medications prior to visit.    Allergies  Allergen Reactions  . Tramadol     seizure       Objective:    BP 117/75 (BP Location: Left Arm, Patient Position: Sitting, Cuff Size: Normal)   Pulse 98   Temp 98.5 F (36.9  C) (Oral)   Ht 5' 7"  (1.702 m)   Wt 186 lb (84.4 kg)   SpO2 97%   BMI 29.13 kg/m  Wt Readings from Last 3 Encounters:  09/21/20 186 lb (84.4 kg)  08/09/20 172 lb (78 kg)  05/27/20 172 lb (78 kg)    Physical Exam Vitals and nursing note reviewed.  Constitutional:      Appearance: He is well-developed and well-nourished.  HENT:     Head: Normocephalic and atraumatic.  Eyes:     Extraocular Movements: EOM normal.  Cardiovascular:     Rate and Rhythm: Normal rate and regular rhythm.     Pulses: Intact distal pulses.     Heart sounds: Normal heart sounds. No murmur heard. No friction rub. No gallop.   Pulmonary:     Effort: Pulmonary effort is normal. No tachypnea or respiratory distress.     Breath sounds: Normal breath sounds. No decreased breath sounds, wheezing, rhonchi or rales.  Chest:     Chest wall: No tenderness.  Abdominal:     General: Bowel sounds are normal.     Palpations: Abdomen is soft.  Musculoskeletal:        General: No edema. Normal range of motion.     Cervical back: Normal range of motion.  Skin:    General: Skin is warm and dry.  Neurological:     Mental Status: He is alert and oriented to person, place, and time.     Coordination: Coordination normal.  Psychiatric:        Mood and Affect: Mood and affect normal.        Behavior: Behavior normal. Behavior is cooperative.        Thought Content: Thought content normal.        Judgment: Judgment normal.          Patient has been counseled extensively about nutrition and exercise as well as the importance of adherence with medications and regular follow-up. The patient was given clear instructions to go to ER or return to medical center if symptoms don't improve, worsen or new problems develop. The patient verbalized understanding.   Follow-up: Return in about 2 months (around 12/02/2020).   [name], FNP-BC [place]  [place], [place]    09/21/2020, 1:46 PM

## 2020-09-28 ENCOUNTER — Encounter (HOSPITAL_BASED_OUTPATIENT_CLINIC_OR_DEPARTMENT_OTHER): Payer: Self-pay | Admitting: Orthopaedic Surgery

## 2020-09-28 ENCOUNTER — Other Ambulatory Visit: Payer: Self-pay

## 2020-10-01 ENCOUNTER — Other Ambulatory Visit (HOSPITAL_COMMUNITY): Payer: Self-pay

## 2020-10-03 ENCOUNTER — Other Ambulatory Visit (HOSPITAL_COMMUNITY)
Admission: RE | Admit: 2020-10-03 | Discharge: 2020-10-03 | Disposition: A | Discharge status: Home / Self Care | Payer: Self-pay | Source: Ambulatory Visit | Attending: Orthopedic Surgery | Admitting: Orthopedic Surgery

## 2020-10-03 ENCOUNTER — Encounter (HOSPITAL_BASED_OUTPATIENT_CLINIC_OR_DEPARTMENT_OTHER)
Admission: RE | Admit: 2020-10-03 | Discharge: 2020-10-03 | Disposition: A | Discharge status: Home / Self Care | Payer: Self-pay | Source: Ambulatory Visit | Attending: Orthopaedic Surgery | Admitting: Orthopaedic Surgery

## 2020-10-03 DIAGNOSIS — I10 Essential (primary) hypertension: Secondary | ICD-10-CM | POA: Insufficient documentation

## 2020-10-03 DIAGNOSIS — Z20822 Contact with and (suspected) exposure to covid-19: Secondary | ICD-10-CM | POA: Insufficient documentation

## 2020-10-03 DIAGNOSIS — Z01810 Encounter for preprocedural cardiovascular examination: Secondary | ICD-10-CM | POA: Insufficient documentation

## 2020-10-03 DIAGNOSIS — Z01812 Encounter for preprocedural laboratory examination: Secondary | ICD-10-CM | POA: Insufficient documentation

## 2020-10-03 LAB — BASIC METABOLIC PANEL
Anion gap: 12 (ref 5–15)
BUN: 10 mg/dL (ref 6–20)
CO2: 23 mmol/L (ref 22–32)
Calcium: 9.3 mg/dL (ref 8.9–10.3)
Chloride: 102 mmol/L (ref 98–111)
Creatinine, Ser: 1.06 mg/dL (ref 0.61–1.24)
GFR, Estimated: >60 mL/min (ref >60)
Glucose, Bld: 90 mg/dL (ref 70–99)
Potassium: 4.9 mmol/L (ref 3.5–5.1)
Sodium: 137 mmol/L (ref 135–145)

## 2020-10-03 LAB — SARS CORONAVIRUS 2 (TAT 6-24 HRS): SARS Coronavirus 2: NEGATIVE

## 2020-10-03 NOTE — Progress Notes (Signed)

## 2020-10-05 ENCOUNTER — Ambulatory Visit (HOSPITAL_BASED_OUTPATIENT_CLINIC_OR_DEPARTMENT_OTHER): Payer: Self-pay | Admitting: Anesthesiology

## 2020-10-05 ENCOUNTER — Encounter (HOSPITAL_BASED_OUTPATIENT_CLINIC_OR_DEPARTMENT_OTHER)
Admission: RE | Disposition: A | Discharge status: Home / Self Care | Payer: Self-pay | Source: Home / Self Care | Attending: Orthopaedic Surgery

## 2020-10-05 ENCOUNTER — Other Ambulatory Visit: Payer: Self-pay

## 2020-10-05 ENCOUNTER — Encounter (HOSPITAL_BASED_OUTPATIENT_CLINIC_OR_DEPARTMENT_OTHER): Payer: Self-pay | Admitting: Orthopaedic Surgery

## 2020-10-05 ENCOUNTER — Ambulatory Visit (HOSPITAL_COMMUNITY)
Admission: RE | Admit: 2020-10-05 | Discharge: 2020-10-05 | Disposition: A | Discharge status: Home / Self Care | Payer: Self-pay | Attending: Orthopaedic Surgery | Admitting: Orthopaedic Surgery

## 2020-10-05 DIAGNOSIS — Z885 Allergy status to narcotic agent status: Secondary | ICD-10-CM | POA: Insufficient documentation

## 2020-10-05 DIAGNOSIS — Z888 Allergy status to other drugs, medicaments and biological substances status: Secondary | ICD-10-CM | POA: Insufficient documentation

## 2020-10-05 DIAGNOSIS — Z79899 Other long term (current) drug therapy: Secondary | ICD-10-CM | POA: Insufficient documentation

## 2020-10-05 DIAGNOSIS — Z7984 Long term (current) use of oral hypoglycemic drugs: Secondary | ICD-10-CM | POA: Insufficient documentation

## 2020-10-05 DIAGNOSIS — Z87891 Personal history of nicotine dependence: Secondary | ICD-10-CM | POA: Insufficient documentation

## 2020-10-05 DIAGNOSIS — Z8616 Personal history of COVID-19: Secondary | ICD-10-CM | POA: Insufficient documentation

## 2020-10-05 DIAGNOSIS — M65331 Trigger finger, right middle finger: Secondary | ICD-10-CM | POA: Insufficient documentation

## 2020-10-05 HISTORY — DX: Gastro-esophageal reflux disease without esophagitis: K21.9

## 2020-10-05 HISTORY — PX: TRIGGER FINGER RELEASE: SHX641

## 2020-10-05 LAB — GLUCOSE, CAPILLARY: Glucose-Capillary: 137 mg/dL — ABNORMAL HIGH (ref 70–99)

## 2020-10-05 SURGERY — RELEASE, A1 PULLEY, FOR TRIGGER FINGER
Anesthesia: Monitor Anesthesia Care | Site: Hand | Laterality: Right

## 2020-10-05 MED ORDER — PROPOFOL 500 MG/50ML IV EMUL
INTRAVENOUS | Status: DC | PRN
  Administered 2020-10-05: 100 ug/kg/min via INTRAVENOUS

## 2020-10-05 MED ORDER — ONDANSETRON HCL 4 MG/2ML IJ SOLN
INTRAMUSCULAR | Status: DC | PRN
  Administered 2020-10-05: 4 mg via INTRAVENOUS

## 2020-10-05 MED ORDER — HYDROCODONE-ACETAMINOPHEN 5-325 MG PO TABS: 1.0000 | ORAL_TABLET | Freq: Four times a day (QID) | ORAL | 0 refills | Status: DC | PRN

## 2020-10-05 MED ORDER — ACETAMINOPHEN 500 MG PO TABS: 1000.0000 mg | ORAL_TABLET | Freq: Once | ORAL | Status: DC

## 2020-10-05 MED ORDER — AMISULPRIDE (ANTIEMETIC) 5 MG/2ML IV SOLN: 10.0000 mg | Freq: Once | INTRAVENOUS | Status: DC | PRN

## 2020-10-05 MED ORDER — LACTATED RINGERS IV SOLN: INTRAVENOUS | Status: DC

## 2020-10-05 MED ORDER — CEFAZOLIN SODIUM-DEXTROSE 2-4 GM/100ML-% IV SOLN
INTRAVENOUS | Status: AC
  Filled 2020-10-05: qty 100

## 2020-10-05 MED ORDER — 0.9 % SODIUM CHLORIDE (POUR BTL) OPTIME
TOPICAL | Status: DC | PRN
  Administered 2020-10-05: 200 mL

## 2020-10-05 MED ORDER — MIDAZOLAM HCL 5 MG/5ML IJ SOLN
INTRAMUSCULAR | Status: DC | PRN
  Administered 2020-10-05: 2 mg via INTRAVENOUS

## 2020-10-05 MED ORDER — LIDOCAINE HCL (PF) 0.5 % IJ SOLN
INTRAMUSCULAR | Status: DC | PRN
  Administered 2020-10-05: 30 mL via INTRAVENOUS

## 2020-10-05 MED ORDER — FENTANYL CITRATE (PF) 100 MCG/2ML IJ SOLN
INTRAMUSCULAR | Status: AC
  Filled 2020-10-05: qty 2

## 2020-10-05 MED ORDER — CEFAZOLIN SODIUM-DEXTROSE 2-4 GM/100ML-% IV SOLN
2.0000 g | INTRAVENOUS | Status: AC
  Administered 2020-10-05: 2 g via INTRAVENOUS

## 2020-10-05 MED ORDER — FENTANYL CITRATE (PF) 100 MCG/2ML IJ SOLN
INTRAMUSCULAR | Status: DC | PRN
  Administered 2020-10-05: 100 ug via INTRAVENOUS

## 2020-10-05 MED ORDER — BUPIVACAINE-EPINEPHRINE (PF) 0.25% -1:200000 IJ SOLN
INTRAMUSCULAR | Status: DC | PRN
  Administered 2020-10-05: 6 mL

## 2020-10-05 MED ORDER — MIDAZOLAM HCL 2 MG/2ML IJ SOLN
INTRAMUSCULAR | Status: AC
  Filled 2020-10-05: qty 2

## 2020-10-05 MED ORDER — FENTANYL CITRATE (PF) 100 MCG/2ML IJ SOLN: 25.0000 ug | INTRAMUSCULAR | Status: DC | PRN

## 2020-10-05 SURGICAL SUPPLY — 42 items
BAND INSRT 18 STRL LF DISP RB (MISCELLANEOUS) ×2
BAND RUBBER #18 3X1/16 STRL (MISCELLANEOUS) ×4 IMPLANT
BLADE SURG 15 STRL LF DISP TIS (BLADE) ×1 IMPLANT
BLADE SURG 15 STRL SS (BLADE) ×2
BNDG CMPR 9X4 STRL LF SNTH (GAUZE/BANDAGES/DRESSINGS) ×1
BNDG ELASTIC 3X5.8 VLCR STR LF (GAUZE/BANDAGES/DRESSINGS) ×2 IMPLANT
BNDG ESMARK 4X9 LF (GAUZE/BANDAGES/DRESSINGS) ×2 IMPLANT
BRUSH SCRUB EZ PLAIN DRY (MISCELLANEOUS) ×2 IMPLANT
CANISTER SUCT 1200ML W/VALVE (MISCELLANEOUS) ×2 IMPLANT
CORD BIPOLAR FORCEPS 12FT (ELECTRODE) ×2 IMPLANT
COVER BACK TABLE 60X90IN (DRAPES) ×2 IMPLANT
COVER MAYO STAND STRL (DRAPES) ×2 IMPLANT
COVER WAND RF STERILE (DRAPES) IMPLANT
CUFF TOURN SGL QUICK 18X4 (TOURNIQUET CUFF) ×2 IMPLANT
DECANTER SPIKE VIAL GLASS SM (MISCELLANEOUS) IMPLANT
DRAPE EXTREMITY T 121X128X90 (DISPOSABLE) ×2 IMPLANT
DRAPE SURG 17X23 STRL (DRAPES) ×2 IMPLANT
GAUZE SPONGE 4X4 12PLY STRL (GAUZE/BANDAGES/DRESSINGS) ×2 IMPLANT
GAUZE XEROFORM 1X8 LF (GAUZE/BANDAGES/DRESSINGS) ×2 IMPLANT
GLOVE SURG LTX SZ7 (GLOVE) ×2 IMPLANT
GLOVE SURG NEOP MICRO LF SZ7.5 (GLOVE) ×2 IMPLANT
GLOVE SURG SS PI 7.0 STRL IVOR (GLOVE) ×2 IMPLANT
GLOVE SURG SYN 7.5  E (GLOVE) ×1
GLOVE SURG SYN 7.5 E (GLOVE) ×1 IMPLANT
GLOVE SURG UNDER POLY LF SZ7 (GLOVE) ×6 IMPLANT
GOWN STRL REIN XL XLG (GOWN DISPOSABLE) ×2 IMPLANT
GOWN STRL REUS W/ TWL XL LVL3 (GOWN DISPOSABLE) ×1 IMPLANT
GOWN STRL REUS W/TWL XL LVL3 (GOWN DISPOSABLE) ×2
NEEDLE HYPO 25X1 1.5 SAFETY (NEEDLE) ×2 IMPLANT
NS IRRIG 1000ML POUR BTL (IV SOLUTION) ×2 IMPLANT
PACK BASIN DAY SURGERY FS (CUSTOM PROCEDURE TRAY) ×2 IMPLANT
PAD CAST 3X4 CTTN HI CHSV (CAST SUPPLIES) ×1 IMPLANT
PADDING CAST COTTON 3X4 STRL (CAST SUPPLIES) ×2
SHEET MEDIUM DRAPE 40X70 STRL (DRAPES) ×2 IMPLANT
STOCKINETTE 4X48 STRL (DRAPES) ×2 IMPLANT
SUT ETHILON 4 0 PS 2 18 (SUTURE) ×2 IMPLANT
SYR BULB EAR ULCER 3OZ GRN STR (SYRINGE) ×2 IMPLANT
SYR CONTROL 10ML LL (SYRINGE) ×2 IMPLANT
TOWEL GREEN STERILE FF (TOWEL DISPOSABLE) ×2 IMPLANT
TRAY DSU PREP LF (CUSTOM PROCEDURE TRAY) ×2 IMPLANT
TUBE CONNECTING 20X1/4 (TUBING) ×2 IMPLANT
UNDERPAD 30X36 HEAVY ABSORB (UNDERPADS AND DIAPERS) ×2 IMPLANT

## 2020-10-05 NOTE — Anesthesia Preprocedure Evaluation (Signed)
Anesthesia Evaluation  Patient identified by MRN, date of birth, ID band Patient awake    Reviewed: Allergy & Precautions, NPO status , Patient's Chart, lab work & pertinent test results  Airway Mallampati: II  TM Distance: >3 FB Neck ROM: Full    Dental  (+) Dental Advisory Given   Pulmonary former smoker,    breath sounds clear to auscultation       Cardiovascular hypertension, Pt. on medications  Rhythm:Regular Rate:Normal     Neuro/Psych    GI/Hepatic Neg liver ROS, GERD  Medicated,S/p gastric banding    Endo/Other  diabetes, Type 2  Renal/GU negative Renal ROS     Musculoskeletal   Abdominal   Peds  Hematology negative hematology ROS (+)   Anesthesia Other Findings   Reproductive/Obstetrics                             Anesthesia Physical Anesthesia Plan  ASA: II  Anesthesia Plan: Bier Block and MAC and Bier Block-LIDOCAINE ONLY   Post-op Pain Management:    Induction:   PONV Risk Score and Plan: 1 and Propofol infusion, Ondansetron and Treatment may vary due to age or medical condition  Airway Management Planned: Natural Airway and Simple Face Mask  Additional Equipment:   Intra-op Plan:   Post-operative Plan:   Informed Consent: I have reviewed the patients History and Physical, chart, labs and discussed the procedure including the risks, benefits and alternatives for the proposed anesthesia with the patient or authorized representative who has indicated his/her understanding and acceptance.       Plan Discussed with: CRNA  Anesthesia Plan Comments:         Anesthesia Quick Evaluation

## 2020-10-05 NOTE — H&P (Signed)
PREOPERATIVE H&P  Chief Complaint: right long finger trigger finger  HPI: Kevin [name] is a 55 y.o. male who presents for surgical treatment of right long finger trigger finger.  He denies any changes in medical history.  Past Medical History:  Diagnosis Date  . COVID 04/20/2020  . Diabetes mellitus without complication ([place])   . GERD (gastroesophageal reflux disease)   . Hypertension   . MRSA (methicillin resistant Staphylococcus aureus)    Past Surgical History:  Procedure Laterality Date  . BACK SURGERY    . LAPAROSCOPIC GASTRIC BANDING     Social History   Socioeconomic History  . Marital status: Divorced    Spouse name: Not on file  . Number of children: Not on file  . Years of education: Not on file  . Highest education level: Not on file  Occupational History  . Not on file  Tobacco Use  . Smoking status: Former Smoker    Quit date: 2000    Years since quitting: 22.0  . Smokeless tobacco: Never Used  Vaping Use  . Vaping Use: Never used  Substance and Sexual Activity  . Alcohol use: No  . Drug use: No  . Sexual activity: Yes  Other Topics Concern  . Not on file  Social History Narrative  . Not on file   Social Determinants of Health   Financial Resource Strain: Not on file  Food Insecurity: Not on file  Transportation Needs: Not on file  Physical Activity: Not on file  Stress: Not on file  Social Connections: Not on file   Family History  Problem Relation Age of Onset  . Heart disease Mother   . Hypertension Mother   . Diabetes Sister   . Hypertension Sister   . Stroke Brother   . Diabetes Brother   . Heart disease Brother   . Heart attack Brother   . Hypertension Brother   . Hypertension Father    Allergies  Allergen Reactions  . Tramadol     seizure   Prior to Admission medications   Medication Sig Start Date End Date Taking? Authorizing Provider  acetaminophen-codeine (TYLENOL #3) 300-30 MG tablet Take 1-2 tablets by mouth  every 8 (eight) hours as needed for moderate pain. 09/21/20 10/21/20 Yes [name], NP  amLODipine (NORVASC) 5 MG tablet Take 1 tablet (5 mg total) by mouth daily. May take an extra 1/2 tablet (2.5 mg) for systolic greater than 382 09/21/20  Yes [name], NP  atorvastatin (LIPITOR) 40 MG tablet Take 1 tablet (40 mg total) by mouth daily. 09/21/20  Yes [name], NP  enalapril (VASOTEC) 20 MG tablet Take 2 tablets (40 mg total) by mouth daily. Please fill as 90 day supply 09/21/20 12/20/20 Yes [name], NP  glimepiride (AMARYL) 4 MG tablet Take 1 tablet (4 mg total) by mouth daily before breakfast. Please fill as a 90 day supply 09/21/20 12/20/20 Yes [name], NP  HYDROcodone-acetaminophen (NORCO) 5-325 MG tablet Take 1 tablet by mouth every 6 (six) hours as needed. 10/05/20  Yes Leandrew Koyanagi, MD  omeprazole (PRILOSEC) 20 MG capsule Take 1 capsule (20 mg total) by mouth 2 (two) times daily before a meal. Please fill as a 90 day supply 09/21/20 12/20/20 Yes [name], NP  sitaGLIPtin-metformin (JANUMET) 50-1000 MG tablet TAKE 1 TABLET BY MOUTH 2 (TWO) TIMES DAILY WITH A MEAL. Please fill as 90 day supply 09/21/20  Yes [name], NP  Blood Glucose Monitoring Suppl (TRUE METRIX METER) w/Device KIT Use as instructed 10/23/17   [name], NP  glucose blood (TRUE METRIX BLOOD GLUCOSE TEST) test strip Use as instructed. Check blood glucose level by fingerstick twice per day. 11/24/19   [name], NP  pneumococcal 23 valent vaccine (PNEUMOVAX 23) 25 MCG/0.5ML injection To be administered by the Pharmacist 11/26/17   Tresa Garter, MD  TRUEplus Lancets 28G MISC Use as instructed. Check blood glucose level by fingerstick twice per day. 11/24/19   [name], NP     Positive ROS: All other systems have been reviewed and were otherwise negative with the exception of those mentioned in the HPI and as above.  Physical Exam: General: Alert, no acute  distress Cardiovascular: No pedal edema Respiratory: No cyanosis, no use of accessory musculature GI: abdomen soft Skin: No lesions in the area of chief complaint Neurologic: Sensation intact distally Psychiatric: Patient is competent for consent with normal mood and affect Lymphatic: no lymphedema  MUSCULOSKELETAL: exam stable  Assessment: right long finger trigger finger  Plan: Plan for Procedure(s): RELEASE TRIGGER FINGER RIGHT LONG FINGER  The risks benefits and alternatives were discussed with the patient including but not limited to the risks of nonoperative treatment, versus surgical intervention including infection, bleeding, nerve injury,  blood clots, cardiopulmonary complications, morbidity, mortality, among others, and they were willing to proceed.   Preoperative templating of the joint replacement has been completed, documented, and submitted to the Operating Room personnel in order to optimize intra-operative equipment management.   Aundra Dubin, PA-C 10/05/2020 1:00 PM

## 2020-10-05 NOTE — Discharge Instructions (Signed)
Postoperative instructions:  Weightbearing instructions: don't lift more than 10 lbs  Dressing instructions: Keep your dressing and/or splint clean and dry at all times.  It will be removed at your first post-operative appointment.  Your stitches and/or staples will be removed at this visit.  Incision instructions:  Do not soak your incision for 3 weeks after surgery.  If the incision gets wet, pat dry and do not scrub the incision.  Pain control:  You have been given a prescription to be taken as directed for post-operative pain control.  In addition, elevate the operative extremity above the heart at all times to prevent swelling and throbbing pain.  Take over-the-counter Colace, 100mg  by mouth twice a day while taking narcotic pain medications to help prevent constipation.  Follow up appointments: 1) 7 days for wound check. 2) Dr. as scheduled.   -------------------------------------------------------------------------------------------------------------  After Surgery Pain Control:  After your surgery, post-surgical discomfort or pain is likely. This discomfort can last several days to a few weeks. At certain times of the day your discomfort may be more intense.  Did you receive a nerve block?  A nerve block can provide pain relief for one hour to two days after your surgery. As long as the nerve block is working, you will experience little or no sensation in the area the surgeon operated on.  As the nerve block wears off, you will begin to experience pain or discomfort. It is very important that you begin taking your prescribed pain medication before the nerve block fully wears off. Treating your pain at the first sign of the block wearing off will ensure your pain is better controlled and more tolerable when full-sensation returns. Do not wait until the pain is intolerable, as the medicine will be less effective. It is better to treat pain in advance than to try and catch up.   General Anesthesia:  If you did not receive a nerve block during your surgery, you will need to start taking your pain medication shortly after your surgery and should continue to do so as prescribed by your surgeon.  Pain Medication:  Most commonly we prescribe Vicodin and Percocet for post-operative pain. Both of these medications contain a combination of acetaminophen (Tylenol) and a narcotic to help control pain.   It takes between 30 and 45 minutes before pain medication starts to work. It is important to take your medication before your pain level gets too intense.   Nausea is a common side effect of many pain medications. You will want to eat something before taking your pain medicine to help prevent nausea.   If you are taking a prescription pain medication that contains acetaminophen, we recommend that you do not take additional over the counter acetaminophen (Tylenol).  Other pain relieving options:   Using a cold pack to ice the affected area a few times a day (15 to 20 minutes at a time) can help to relieve pain, reduce swelling and bruising.   Elevation of the affected area can also help to reduce pain and swelling.   Post Anesthesia Home Care Instructions  Activity: Get plenty of rest for the remainder of the day. A responsible individual must stay with you for 24 hours following the procedure.  For the next 24 hours, DO NOT: -Drive a car -Roda Shutters -Drink alcoholic beverages -Take any medication unless instructed by your physician -Make any legal decisions or sign important papers.  Meals: Start with liquid foods such as gelatin or  soup. Progress to regular foods as tolerated. Avoid greasy, spicy, heavy foods. If nausea and/or vomiting occur, drink only clear liquids until the nausea and/or vomiting subsides. Call your physician if vomiting continues.  Special Instructions/Symptoms: Your throat may feel dry or sore from the anesthesia or the breathing tube  placed in your throat during surgery. If this causes discomfort, gargle with warm salt water. The discomfort should disappear within 24 hours.  If you had a scopolamine patch placed behind your ear for the management of post- operative nausea and/or vomiting:  1. The medication in the patch is effective for 72 hours, after which it should be removed.  Wrap patch in a tissue and discard in the trash. Wash hands thoroughly with soap and water. 2. You may remove the patch earlier than 72 hours if you experience unpleasant side effects which may include dry mouth, dizziness or visual disturbances. 3. Avoid touching the patch. Wash your hands with soap and water after contact with the patch.  Regional Anesthesia Blocks  1. Numbness or the inability to move the "blocked" extremity may last from 3-48 hours after placement. The length of time depends on the medication injected and your individual response to the medication. If the numbness is not going away after 48 hours, call your surgeon.  2. The extremity that is blocked will need to be protected until the numbness is gone and the  Strength has returned. Because you cannot feel it, you will need to take extra care to avoid injury. Because it may be weak, you may have difficulty moving it or using it. You may not know what position it is in without looking at it while the block is in effect.  3. For blocks in the legs and feet, returning to weight bearing and walking needs to be done carefully. You will need to wait until the numbness is entirely gone and the strength has returned. You should be able to move your leg and foot normally before you try and bear weight or walk. You will need someone to be with you when you first try to ensure you do not fall and possibly risk injury.  4. Bruising and tenderness at the needle site are common side effects and will resolve in a few days.  5. Persistent numbness or new problems with movement should be  communicated to the surgeon or the [facility] [phone] [facility] [phone]).     You may take Tylenol when you get home, only if you are not also taking prescribed pain medicine containing Tylenol.

## 2020-10-05 NOTE — Anesthesia Procedure Notes (Signed)
Anesthesia Regional Block: Bier block (IV Regional)   Pre-Anesthetic Checklist: ,, timeout performed, Correct Patient, Correct Site, Correct Laterality, Correct Procedure,, site marked, surgical consent,, at surgeon's request  Laterality: Right     Needles:  Injection technique: Single-shot  Needle Type: Other      Needle Gauge: 20     Additional Needles:   Procedures:,,,,, intact distal pulses, Esmarch exsanguination, single tourniquet utilized,  Narrative:   Performed by: Personally       

## 2020-10-05 NOTE — Anesthesia Postprocedure Evaluation (Signed)
Anesthesia Post Note  Patient: Kevin [name]  Procedure(s) Performed: RELEASE TRIGGER FINGER RIGHT LONG FINGER (Right Hand)     Patient location during evaluation: PACU Anesthesia Type: MAC and Bier Block Level of consciousness: awake and alert Pain management: pain level controlled Vital Signs Assessment: post-procedure vital signs reviewed and stable Respiratory status: spontaneous breathing, nonlabored ventilation, respiratory function stable and patient connected to nasal cannula oxygen Cardiovascular status: stable and blood pressure returned to baseline Postop Assessment: no apparent nausea or vomiting Anesthetic complications: no   No complications documented.  Last Vitals:  Vitals:   10/05/20 1400 10/05/20 1423  BP: (!) 146/86 (!) 151/89  Pulse: 77 77  Resp: 14 16  Temp:  36.6 C  SpO2: 99% 97%    Last Pain:  Vitals:   10/05/20 1412  TempSrc:   PainSc: 0-No pain                 Tiajuana Amass

## 2020-10-05 NOTE — Anesthesia Procedure Notes (Signed)
Procedure Name: MAC Date/Time: 10/05/2020 1:19 PM Performed by: Signe Colt, CRNA Pre-anesthesia Checklist: Patient identified, Emergency Drugs available, Suction available, Patient being monitored and Timeout performed Patient Re-evaluated:Patient Re-evaluated prior to induction

## 2020-10-05 NOTE — Op Note (Signed)
   Date of Surgery: 10/05/2020  INDICATIONS: Kevin [name] is a 55 y.o.-year-old male who presents for surgical treatment of a right middle trigger finger;  The patient did consent to the procedure after discussion of the risks and benefits.  PREOPERATIVE DIAGNOSIS: right middle trigger finger  POSTOPERATIVE DIAGNOSIS: Same.  PROCEDURE: Incision of A1 pulley for trigger finger release, right middle finger  SURGEON: N. Glee Arvin, M.D.  ASSIST: Starlyn Skeans [place], [place]; necessary for the timely completion of procedure  ANESTHESIA:  Bier block  IV FLUIDS AND URINE: See anesthesia.  ESTIMATED BLOOD LOSS: minimal mL  COMPLICATIONS: None.  DESCRIPTION OF PROCEDURE: The patient was identified in the preoperative holding area. The operative site was marked by the surgeon confirmed with the patient. He is brought back to the operating room. She was placed supine on table. A nonsterile tourniquet was placed on the upper forearm. The extremity was exsanguinated using Esmarch bandage and the tourniquet was inflated to 275 mmHg. The Bier block was administered. The operative extremity was prepped and draped in standard sterile fashion. Timeout was performed. Antibiotics were given. Timeout was performed. A vertical incision was made directly over the metacarpal head of the right middle finger was used. Blunt dissection was taken down to the level of the flexor tendon. The neurovascular bundles were identified on each side of the tendon sheath and protected. The proximal edge of the A1 pulley was identified. This was sharply divided with tenotomy scissors. Of note the tendon was of good quality and did not exhibit any tears. The A1 pulley was incised along its full length.  The flaps were removed with a knife to minimize risk of reformation of the pulley. Care was taken not to violate the A2 pulley. The palmar pulley was then visualized and released also. The tourniquet was then deflated and hemostasis was  obtained. Local anesthesia was infiltrated. The wound was thoroughly irrigated and closed with 3-0 nylon sutures. Sterile dressings were applied and the hand was placed in a soft dressing. Patient tolerated the procedure well and was taken to the PACU in stable condition.  POSTOPERATIVE PLAN: Patient will be weight bearing as tolerated and to avoid heavy lifting for 4 weeks.    Kevin Reel, MD [facility] [phone] 1:23 PM

## 2020-10-05 NOTE — Transfer of Care (Signed)
Immediate Anesthesia Transfer of Care Note  Patient: Kevin [name]  Procedure(s) Performed: RELEASE TRIGGER FINGER RIGHT LONG FINGER (Right Hand)  Patient Location: PACU  Anesthesia Type:Bier block  Level of Consciousness: awake, alert , oriented and patient cooperative  Airway & Oxygen Therapy: Patient Spontanous Breathing and Patient connected to face mask oxygen  Post-op Assessment: Report given to RN and Post -op Vital signs reviewed and stable  Post vital signs: Reviewed and stable  Last Vitals:  Vitals Value Taken Time  BP 139/61 10/05/20 1332  Temp    Pulse 87 10/05/20 1333  Resp 7 10/05/20 1333  SpO2 98 % 10/05/20 1333  Vitals shown include unvalidated device data.  Last Pain:  Vitals:   10/05/20 1205  TempSrc: Oral  PainSc:          Complications: No complications documented.

## 2020-10-06 ENCOUNTER — Encounter (HOSPITAL_BASED_OUTPATIENT_CLINIC_OR_DEPARTMENT_OTHER): Payer: Self-pay | Admitting: Orthopaedic Surgery

## 2020-10-11 MED FILL — JANUMET 50-1,000 MG TABLET: 50-1000 | 30 days supply | Qty: 60 | Fill #0

## 2020-10-12 ENCOUNTER — Other Ambulatory Visit: Payer: Self-pay | Admitting: Physician Assistant

## 2020-10-12 ENCOUNTER — Encounter: Payer: Self-pay | Admitting: Physician Assistant

## 2020-10-12 ENCOUNTER — Ambulatory Visit (INDEPENDENT_AMBULATORY_CARE_PROVIDER_SITE_OTHER): Payer: Self-pay | Admitting: Physician Assistant

## 2020-10-12 DIAGNOSIS — M65331 Trigger finger, right middle finger: Secondary | ICD-10-CM

## 2020-10-12 MED ORDER — HYDROCODONE-ACETAMINOPHEN 5-325 MG PO TABS: 1.0000 | ORAL_TABLET | Freq: Three times a day (TID) | ORAL | 0 refills | Status: DC | PRN

## 2020-10-12 NOTE — Progress Notes (Signed)
   Post-Op Visit Note   Patient: Kevin [name]           Date of Birth: [birth year]           MRN: [id number] Visit Date: 10/12/2020 PCP: Claiborne Rigg, NP   Assessment & Plan:  Chief Complaint:  Chief Complaint  Patient presents with  . Right Middle Finger - Pain   Visit Diagnoses:  1. Trigger finger, right middle finger     Plan: Patient is a pleasant 55 year old gentleman who comes in today 7 days out right long trigger finger release.  He has been doing okay.  He has been taking Norco for pain.  He is slightly concerned that he is unable to fully extend or flex his finger.  Examination of his right hand reveals a well-healing surgical incision with nylon sutures in place.  No evidence of infection or cellulitis.  He does have mild swelling and ecchymosis.  Fingers are warm and well-perfused.  He does have limitation with fully extending and flexing his long finger.  No reproducible triggering.  He is neurovascular intact distally.  Today, the wound was covered.  He will follow up next week for suture removal.  Call with concerns or questions.  Follow-Up Instructions: Return in about 1 week (around 10/19/2020).   Orders:  No orders of the defined types were placed in this encounter.  No orders of the defined types were placed in this encounter.   Imaging: No new imaging  PMFS History: Patient Active Problem List   Diagnosis Date Noted  . Trigger finger, right middle finger 10/05/2020  . Toe pain, chronic, left 10/07/2018  . Hyperlipidemia associated with type 2 diabetes mellitus (HCC) 09/19/2018  . Hyperlipidemia 03/18/2018  . Dyspnea on exertion 03/18/2018  . Chest pain 03/18/2018  . Diabetes mellitus without complication (HCC) 11/11/2017  . Essential hypertension 11/11/2017   Past Medical History:  Diagnosis Date  . COVID 04/20/2020  . Diabetes mellitus without complication (HCC)   . GERD (gastroesophageal reflux disease)   . Hypertension   . MRSA (methicillin  resistant Staphylococcus aureus)     Family History  Problem Relation Age of Onset  . Heart disease Mother   . Hypertension Mother   . Diabetes Sister   . Hypertension Sister   . Stroke Brother   . Diabetes Brother   . Heart disease Brother   . Heart attack Brother   . Hypertension Brother   . Hypertension Father     Past Surgical History:  Procedure Laterality Date  . BACK SURGERY    . LAPAROSCOPIC GASTRIC BANDING    . TRIGGER FINGER RELEASE Right 10/05/2020   Procedure: RELEASE TRIGGER FINGER RIGHT LONG FINGER;  Surgeon: Tarry Kos, MD;  Location: [facility];  Service: Orthopedics;  Laterality: Right;   Social History   Occupational History  . Not on file  Tobacco Use  . Smoking status: Former Smoker    Quit date: 2000    Years since quitting: 22.1  . Smokeless tobacco: Never Used  Vaping Use  . Vaping Use: Never used  Substance and Sexual Activity  . Alcohol use: No  . Drug use: No  . Sexual activity: Yes

## 2020-10-20 ENCOUNTER — Ambulatory Visit (INDEPENDENT_AMBULATORY_CARE_PROVIDER_SITE_OTHER): Payer: Self-pay | Admitting: Orthopaedic Surgery

## 2020-10-20 ENCOUNTER — Encounter: Payer: Self-pay | Admitting: Orthopaedic Surgery

## 2020-10-20 DIAGNOSIS — M65331 Trigger finger, right middle finger: Secondary | ICD-10-CM

## 2020-10-20 NOTE — Progress Notes (Signed)
   Post-Op Visit Note   Patient: Kevin [name]           Date of Birth: [birth year]           MRN: [id number] Visit Date: 10/20/2020 PCP: Claiborne Rigg, NP   Assessment & Plan:  Chief Complaint:  Chief Complaint  Patient presents with  . Right Middle Finger - Pain, Routine Post Op    10/05/2020-Release Trigger Finger Right Long Finger - Right   Visit Diagnoses:  1. Trigger finger, right middle finger     Plan:   Patient is 2 weeks status post right middle trigger finger release.  He is doing better and this week than last week.  He still has some residual swelling in the long finger.  No longer has any triggering.  Surgical incision is intact.  No signs of infection.  He does have moderate swelling to the long finger.  But decreased arc of motion secondary to the swelling.  Sutures removed today.  He will continue to ice and elevate and use his hand as tolerated.  Recheck in 2 weeks.  If he still having trouble with range of motion or swelling at that time may need to consider hand therapy.  Follow-Up Instructions: Return in about 2 weeks (around 11/03/2020).   Orders:  No orders of the defined types were placed in this encounter.  No orders of the defined types were placed in this encounter.   Imaging: No results found.  PMFS History: Patient Active Problem List   Diagnosis Date Noted  . Trigger finger, right middle finger 10/05/2020  . Toe pain, chronic, left 10/07/2018  . Hyperlipidemia associated with type 2 diabetes mellitus (HCC) 09/19/2018  . Hyperlipidemia 03/18/2018  . Dyspnea on exertion 03/18/2018  . Chest pain 03/18/2018  . Diabetes mellitus without complication (HCC) 11/11/2017  . Essential hypertension 11/11/2017   Past Medical History:  Diagnosis Date  . COVID 04/20/2020  . Diabetes mellitus without complication (HCC)   . GERD (gastroesophageal reflux disease)   . Hypertension   . MRSA (methicillin resistant Staphylococcus aureus)     Family  History  Problem Relation Age of Onset  . Heart disease Mother   . Hypertension Mother   . Diabetes Sister   . Hypertension Sister   . Stroke Brother   . Diabetes Brother   . Heart disease Brother   . Heart attack Brother   . Hypertension Brother   . Hypertension Father     Past Surgical History:  Procedure Laterality Date  . BACK SURGERY    . LAPAROSCOPIC GASTRIC BANDING    . TRIGGER FINGER RELEASE Right 10/05/2020   Procedure: RELEASE TRIGGER FINGER RIGHT LONG FINGER;  Surgeon: Tarry Kos, MD;  Location: [facility];  Service: Orthopedics;  Laterality: Right;   Social History   Occupational History  . Not on file  Tobacco Use  . Smoking status: Former Smoker    Quit date: 2000    Years since quitting: 22.1  . Smokeless tobacco: Never Used  Vaping Use  . Vaping Use: Never used  Substance and Sexual Activity  . Alcohol use: No  . Drug use: No  . Sexual activity: Yes

## 2020-11-03 ENCOUNTER — Ambulatory Visit (INDEPENDENT_AMBULATORY_CARE_PROVIDER_SITE_OTHER): Payer: Self-pay | Admitting: Physician Assistant

## 2020-11-03 ENCOUNTER — Encounter: Payer: Self-pay | Admitting: Orthopaedic Surgery

## 2020-11-03 VITALS — Ht 67.0 in | Wt 185.0 lb

## 2020-11-03 DIAGNOSIS — M65331 Trigger finger, right middle finger: Secondary | ICD-10-CM

## 2020-11-03 NOTE — Progress Notes (Signed)
   Post-Op Visit Note   Patient: Kevin [name]           Date of Birth: March 16, 1966           MRN: [id number] Visit Date: 11/03/2020 PCP: Claiborne Rigg, NP   Assessment & Plan:  Chief Complaint:  Chief Complaint  Patient presents with  . Right Hand - Follow-up    Right long trigger finger release 10/05/20   Visit Diagnoses:  1. Trigger finger, right middle finger     Plan: Patient is a pleasant 55 year old gentleman who comes in today 4 weeks out trigger finger release right long finger 10/05/2020.  He has been doing well in regards to the trigger finger but still notes decreased mobility with fully extending or flexing the long finger.  This initially began 6 to 8 weeks after sustaining a fall about a year ago.  Examination of his right long finger reveals a fully healed surgical scar without complication.  He has a slight flexion contracture at the PIP joint and is unable to fully extend.  He does have some limitation with flexion as well.  He is neurovascular intact distally.  At this point, we have discussed proceeding with hand therapy for which she is agreeable to.  I put in an internal referral.  He will follow up with [place] in 4 weeks time for recheck.  Call with concerns or questions.  Follow-Up Instructions: Return in about 4 weeks (around 12/01/2020).   Orders:  Orders Placed This Encounter  Procedures  . Ambulatory referral to Physical Therapy   No orders of the defined types were placed in this encounter.   Imaging: No new imaging  PMFS History: Patient Active Problem List   Diagnosis Date Noted  . Trigger finger, right middle finger 10/05/2020  . Toe pain, chronic, left 10/07/2018  . Hyperlipidemia associated with type 2 diabetes mellitus (HCC) 09/19/2018  . Hyperlipidemia 03/18/2018  . Dyspnea on exertion 03/18/2018  . Chest pain 03/18/2018  . Diabetes mellitus without complication (HCC) 11/11/2017  . Essential hypertension 11/11/2017   Past Medical History:   Diagnosis Date  . COVID 04/20/2020  . Diabetes mellitus without complication (HCC)   . GERD (gastroesophageal reflux disease)   . Hypertension   . MRSA (methicillin resistant Staphylococcus aureus)     Family History  Problem Relation Age of Onset  . Heart disease Mother   . Hypertension Mother   . Diabetes Sister   . Hypertension Sister   . Stroke Brother   . Diabetes Brother   . Heart disease Brother   . Heart attack Brother   . Hypertension Brother   . Hypertension Father     Past Surgical History:  Procedure Laterality Date  . BACK SURGERY    . LAPAROSCOPIC GASTRIC BANDING    . TRIGGER FINGER RELEASE Right 10/05/2020   Procedure: RELEASE TRIGGER FINGER RIGHT LONG FINGER;  Surgeon: Tarry Kos, MD;  Location: [facility];  Service: Orthopedics;  Laterality: Right;   Social History   Occupational History  . Not on file  Tobacco Use  . Smoking status: Former Smoker    Quit date: 2000    Years since quitting: 22.1  . Smokeless tobacco: Never Used  Vaping Use  . Vaping Use: Never used  Substance and Sexual Activity  . Alcohol use: No  . Drug use: No  . Sexual activity: Yes

## 2020-11-14 ENCOUNTER — Other Ambulatory Visit: Payer: Self-pay

## 2020-11-14 ENCOUNTER — Ambulatory Visit: Payer: Self-pay | Attending: Physician Assistant | Admitting: Occupational Therapy

## 2020-11-14 ENCOUNTER — Ambulatory Visit (HOSPITAL_COMMUNITY)
Admission: RE | Admit: 2020-11-14 | Discharge: 2020-11-14 | Disposition: A | Discharge status: Home / Self Care | Payer: Self-pay | Source: Ambulatory Visit | Attending: Nurse Practitioner | Admitting: Nurse Practitioner

## 2020-11-14 ENCOUNTER — Encounter: Payer: Self-pay | Admitting: Occupational Therapy

## 2020-11-14 DIAGNOSIS — M25561 Pain in right knee: Secondary | ICD-10-CM | POA: Insufficient documentation

## 2020-11-14 DIAGNOSIS — R278 Other lack of coordination: Secondary | ICD-10-CM | POA: Insufficient documentation

## 2020-11-14 DIAGNOSIS — M25541 Pain in joints of right hand: Secondary | ICD-10-CM | POA: Insufficient documentation

## 2020-11-14 DIAGNOSIS — R600 Localized edema: Secondary | ICD-10-CM | POA: Insufficient documentation

## 2020-11-14 DIAGNOSIS — G8929 Other chronic pain: Secondary | ICD-10-CM | POA: Insufficient documentation

## 2020-11-14 DIAGNOSIS — M6281 Muscle weakness (generalized): Secondary | ICD-10-CM | POA: Insufficient documentation

## 2020-11-14 DIAGNOSIS — M25641 Stiffness of right hand, not elsewhere classified: Secondary | ICD-10-CM | POA: Insufficient documentation

## 2020-11-14 MED FILL — JANUMET 50-1,000 MG TABLET: 50-1000 | 30 days supply | Qty: 60 | Fill #1

## 2020-11-14 NOTE — Therapy (Signed)
[facility] [address] [place], [place], [address] Phone: [phone]   Fax:  [phone]  Occupational Therapy Evaluation  Patient Details  Name: Kevin [name] MRN: [id number] Date of Birth: Jul 27, 1966 Referring Provider (OT): Virgia Land. Dub Mikes, [place]   Encounter Date: 11/14/2020   OT End of Session - 11/14/20 0854    Visit Number 1    Number of Visits 17    Date for OT Re-Evaluation 01/13/21    Authorization Type CAFA 100% 08/15/20-02/13/21    OT Start Time 0719    OT Stop Time 0805    OT Time Calculation (min) 46 min    Activity Tolerance Patient tolerated treatment well    Behavior During Therapy [facility] for tasks assessed/performed           Past Medical History:  Diagnosis Date  . COVID 04/20/2020  . Diabetes mellitus without complication (HCC)   . GERD (gastroesophageal reflux disease)   . Hypertension   . MRSA (methicillin resistant Staphylococcus aureus)     Past Surgical History:  Procedure Laterality Date  . BACK SURGERY    . LAPAROSCOPIC GASTRIC BANDING    . TRIGGER FINGER RELEASE Right 10/05/2020   Procedure: RELEASE TRIGGER FINGER RIGHT LONG FINGER;  Surgeon: Tarry Kos, MD;  Location: [facility];  Service: Orthopedics;  Laterality: Right;    There were no vitals filed for this visit.   Subjective Assessment - 11/14/20 0725    Subjective  Pt reports that he feels like he has adapted since he had to adapt things after L hand injury approx 15 yrs ago    Pertinent History R long finger trigger finger release 10/05/20 (initial injury approx over 1 year ago due to fall).  PMH:  DM, HTN, hyperlipidemia, hx of chest pain, hx of multiple back surgeries due to injury, hx of Laparoscopic gastic banding, hx of GERD,  hx of L hand injury approx 15 yrs ago (with decr functional use of digits 4-5)    Limitations hx of L hand injury approx 15 yrs ago (with decr functional use of digits 4-5)     Patient Stated Goals improve R hand functional use    Currently in Pain? Yes    Pain Score 3    up to 6/10 with use, higher if bumps hand or continues activity   Pain Location Finger (Comment which one)   R 3rd digit   Pain Orientation Right    Pain Descriptors / Indicators Aching;Sharp    Pain Type Chronic pain    Pain Onset More than a month ago    Pain Frequency Constant    Aggravating Factors  movement, hitting it    Pain Relieving Factors rest, heat             [facility] OT Assessment - 11/14/20 0001      Assessment   Medical Diagnosis R trigger finger release (long finger)    Referring Provider (OT) Mary L. Dub Mikes, PA-C    Onset Date/Surgical Date 10/05/20    Hand Dominance Left   but used R a lot due to old L hand injury   Next MD Visit 11/14/20    Prior Therapy not for R hand (had extensive therapy for L hand)      Precautions   Precautions None      Balance Screen   Has the patient fallen in the past 6 months No      Home  Environment  Family/patient expects to be discharged to: Private residence    Lives With Significant other   (who has RA and limitations) and 2 older children at home.     Prior Function   Level of Independence Independent    Vocation --   not working   Leisure walk with dogs, being outside, photography (unable to manipulate), working on cars (difficulty)      ADL   Eating/Feeding Modified independent   difficulty   Grooming Modified independent    Upper Body Bathing Modified independent    Lower Body Bathing Modified independent    Upper Body Dressing --   mod I   Lower Body Dressing Modified independent    Toilet Transfer Modified independent    Toileting - Clothing Manipulation Modified independent    Toileting -  Hygiene Modified Independent    Tub/Shower Transfer Modified independent    ADL comments spills, needs to focus to with hand use.  able to type with difficulty, difficulty putting on gloves, uses thumb/index finger mostly       IADL   Shopping Takes care of all shopping needs independently   difficulty pushing cart, carrying groceries   Light Housekeeping --   pain with home maintenance tasks   Meal Prep --   needs help pouring heavier pots/pot (using palm), difficulty keeping fingers bent for cutting, difficulty opening and uses elbow   Community Mobility Drives own vehicle    Medication Management --   difficulty picking up/manipulating pills     Mobility   Mobility Status Independent      Written Expression   Dominant Hand Left      Observation/Other Assessments   Skin Integrity incision site healed, some sensitivity, decr scar mobility      Sensation   Light Touch Appears Intact   no numbness reported, but pt reports some hypersensitivity     Coordination   9 Hole Peg Test Right;Left    Right 9 Hole Peg Test 26.28    Left 9 Hole Peg Test 17.81      Edema   Edema noted R 3rd digit (particularly)  in palm and at PIP      Right Hand AROM   R Long  MCP 0-90 65 Degrees    R Long PIP 0-100 80 Degrees   -30* extension   R Long DIP 0-70 45 Degrees      Hand Function   Right Hand Gross Grasp Impaired   approx 60% gross composite R 3rd digit flex   Right Hand Grip (lbs) 48.2   very limited use of 3rd digit due to decr ROM   Right Hand 3 Point Pinch 19 lbs    Left Hand Gross Grasp Impaired   pt with approx 55 yr old injury to L hand with limitations with digits 4-5.  Due to limitations, pt was using RUE a lot more than nondominant assist   Left Hand Grip (lbs) 90.6    Left 3 point pinch 22 lbs                           OT Education - 11/14/20 0852    Education Details Initial ROM HEP--see pt instructions; Scar Massage; Eval Results and POC    Person(s) Educated Patient    Methods Explanation;Demonstration;Verbal cues;Handout;Tactile cues    Comprehension Verbalized understanding;Returned demonstration;Verbal cues required            OT Short Term Goals -  11/14/20 0905       OT SHORT TERM GOAL #1   Title Pt will be independent with ROM HEP.--check STGs 12/14/20    Time 4    Period Weeks    Status New      OT SHORT TERM GOAL #2   Title Pt will demo at least 90* R 3rd digit PIP flex for improved grasp of objects.    Baseline 80*    Time 4    Period Weeks    Status New      OT SHORT TERM GOAL #3   Title Pt will demo at least 80* R 3rd digit MP flex for improved grasp of objects.    Baseline 65*    Time 4    Period Weeks    Status New      OT SHORT TERM GOAL #4   Title Pt will demo at least 60* R 3rd digit DIP flex for improved grasp of objects.    Baseline 45*    Time 4    Period Weeks    Status New      OT SHORT TERM GOAL #5   Title Pt will be independent with scar massage and splint wear/care (if needed).    Baseline --    Time 4    Period Weeks    Status New      Additional Short Term Goals   Additional Short Term Goals --      OT SHORT TERM GOAL #6   Title --    Baseline --    Time --    Period --    Status --      OT SHORT TERM GOAL #7   Title --    Time --    Period --    Status --             OT Long Term Goals - 11/14/20 0909      OT LONG TERM GOAL #1   Title Pt will be independent with strengthening HEP.--check LTGs 01/13/21    Time 8    Period Weeks    Status New      OT LONG TERM GOAL #2   Title Pt will demo at least 60lbs R grip strength for lifting/picking up objects.    Baseline 48lbs    Time 8    Period Weeks    Status New      OT LONG TERM GOAL #3   Title Pt will report pain less than or equal to 2/10 with R hand functional use for light ADLs/IADLs.    Time 8    Period Weeks    Status New      OT LONG TERM GOAL #4   Title Pt will improve R coordination for ADLs/picking up pills/small objects as shown by completing 9-hole peg test in 21sec or less.    Baseline 26.28sec    Time 8    Period Weeks    Status New      OT LONG TERM GOAL #5   Title Pt will improve R 3rd digit PIP ext to at least -15*  for incr ease in putting hand in pockets/gloves and grasp.    Baseline -30*    Time 8    Period Weeks    Status New      OT LONG TERM GOAL #6   Title Pt will demo at least 100* R 3rd digit PIP flex for improved grasp of objects.  Baseline 80*    Time 8    Period Weeks    Status New      OT LONG TERM GOAL #7   Title Pt will demo at least 90* R 3rd digit MP flex for improved grasp of objects.    Baseline 65*    Time 8    Period Weeks    Status New                 Plan - 11/14/20 0855    Clinical Impression Statement Pt is a 55 y.o. male referred to occupational therapy s/p R long finger trigger finger release 10/05/20.  Pt reports that injury to this finger happened a little over 1 year ago due to fall.  Pt is performing BADLs and IADLs mod I, but with difficulty and pain.  Pt reports using 2nd digit/thumb or palm during functional tasks and is having to use L hand more for activities (with difficulty due to old L hand injury).  Pt does most IADLs due to significant other's limitations with RA.  Pt with PMH that includes:  DM, HTN, hyperlipidemia, hx of chest pain, hx of multiple back surgeries due to injury, hx of Laparoscopic gastic banding, hx of GERD, hx of L hand injury approx 15 yrs ago (with decr functional use of digits 4-5).  Pt presents today with decr ROM, decr strength, edema, pain, and decr coordination affecting R hand functional use.  Pt would benefit from occupational therapy to address these deficits for improved R hand functional use and incr ease with ADLs/IADLs.    OT Occupational Profile and History Detailed Assessment- Review of Records and additional review of physical, cognitive, psychosocial history related to current functional performance    Occupational performance deficits (Please refer to evaluation for details): ADL's;IADL's;Leisure    Body Structure / Function / Physical Skills Dexterity;Pain;Strength;Decreased knowledge of use of DME;ADL;Edema;UE  functional use;ROM;IADL;Sensation;Coordination;FMC;Scar mobility    Rehab Potential Good    Clinical Decision Making Several treatment options, min-mod task modification necessary    Comorbidities Affecting Occupational Performance: May have comorbidities impacting occupational performance    Modification or Assistance to Complete Evaluation  Min-Moderate modification of tasks or assist with assess necessary to complete eval    OT Frequency 2x / week    OT Duration 8 weeks   +eval (or 17 visits)   OT Treatment/Interventions Self-care/ADL training;Moist Heat;Fluidtherapy;DME and/or AE instruction;Splinting;Contrast Bath;Therapeutic activities;Scar mobilization;Therapeutic exercise;Ultrasound;Cryotherapy;Iontophoresis;Neuromuscular education;Passive range of motion;Patient/family education;Manual Therapy;Paraffin;Electrical Stimulation    Plan ultrasound/fludio, review ROM HEP, review scar massage    Consulted and Agree with Plan of Care Patient           Patient will benefit from skilled therapeutic intervention in order to improve the following deficits and impairments:   Body Structure / Function / Physical Skills: Dexterity,Pain,Strength,Decreased knowledge of use of DME,ADL,Edema,UE functional use,ROM,IADL,Sensation,Coordination,FMC,Scar mobility       Visit Diagnosis: Stiffness of right hand, not elsewhere classified  Muscle weakness (generalized)  Localized edema  Other lack of coordination  Pain in joint of right hand    Problem List Patient Active Problem List   Diagnosis Date Noted  . Trigger finger, right middle finger 10/05/2020  . Toe pain, chronic, left 10/07/2018  . Hyperlipidemia associated with type 2 diabetes mellitus (HCC) 09/19/2018  . Hyperlipidemia 03/18/2018  . Dyspnea on exertion 03/18/2018  . Chest pain 03/18/2018  . Diabetes mellitus without complication (HCC) 11/11/2017  . Essential hypertension 11/11/2017    [facility] 11/14/2020, 9:19  AM  [facility] [address] [place], [place], [address] Phone: [phone]   Fax:  [phone]  Name: Fairley Copher MRN: [id number] Date of Birth: 1965/09/17   Willa Frater, OTR/L [facility] [address] [place], [place]  [address] [phone] phone [phone] 11/14/20 9:19 AM

## 2020-11-14 NOTE — Patient Instructions (Addendum)
Flexor Tendon Gliding (Active Hook Fist)   With fingers and knuckles straight, bend middle and tip joints. Do not bend large knuckles. Repeat 10 times. Do 4 sessions per day.   Flexor Tendon Gliding (Active Full Fist)   Straighten all fingers, then make a fist, bending all joints. Repeat 10 times. Do  4 sessions per day.   Flexor Tendon Gliding (Active Straight Fist)   Start with fingers straight. Bend knuckles and middle joints. Keep fingertip joints straight to touch base of palm. Repeat 10 times. Do 4  sessions per day.   MP Flexion (Active Isolated)   Bend ALL fingers at large knuckle, keeping other fingers straight. Do not bend tips. Repeat 10 times. Do 4 sessions per day.  PIP Flexion (Active Blocked)    Hold large knuckle straight using other hand. Bend middle joint of middle finger as far as possible. Hold 3 seconds. Repeat 10 times. Do 4 sessions per day. Activity: Curl fingers around a jar cap.*   PIP Extension (Active Controlled With Wrist and MP Flexion)    Using other hand to hold wrist in flexion and MPs in slight flexion, straighten end joints of fingers. Repeat 10 times. Do 4 sessions per day.

## 2020-11-18 ENCOUNTER — Encounter: Payer: Self-pay | Admitting: Occupational Therapy

## 2020-11-18 ENCOUNTER — Other Ambulatory Visit: Payer: Self-pay

## 2020-11-18 ENCOUNTER — Ambulatory Visit: Payer: Self-pay | Admitting: Occupational Therapy

## 2020-11-18 DIAGNOSIS — M25541 Pain in joints of right hand: Secondary | ICD-10-CM

## 2020-11-18 DIAGNOSIS — M6281 Muscle weakness (generalized): Secondary | ICD-10-CM

## 2020-11-18 DIAGNOSIS — M25641 Stiffness of right hand, not elsewhere classified: Secondary | ICD-10-CM

## 2020-11-18 DIAGNOSIS — R278 Other lack of coordination: Secondary | ICD-10-CM

## 2020-11-18 DIAGNOSIS — R600 Localized edema: Secondary | ICD-10-CM

## 2020-11-18 NOTE — Therapy (Signed)
[facility] [address] [place], [place], [address] Phone: [phone]   Fax:  [phone]  Occupational Therapy Treatment  Patient Details  Name: Kevin [name] MRN: [id number] Date of Birth: Feb 02, 1966 Referring Provider (OT): Virgia Land. Dub Mikes, [place]   Encounter Date: 11/18/2020   OT End of Session - 11/18/20 0805    Visit Number 2    Number of Visits 17    Date for OT Re-Evaluation 01/13/21    Authorization Type CAFA 100% 08/15/20-02/13/21    OT Start Time 0718    OT Stop Time 0800    OT Time Calculation (min) 42 min    Activity Tolerance Patient tolerated treatment well    Behavior During Therapy [facility] for tasks assessed/performed           Past Medical History:  Diagnosis Date  . COVID 04/20/2020  . Diabetes mellitus without complication (HCC)   . GERD (gastroesophageal reflux disease)   . Hypertension   . MRSA (methicillin resistant Staphylococcus aureus)     Past Surgical History:  Procedure Laterality Date  . BACK SURGERY    . LAPAROSCOPIC GASTRIC BANDING    . TRIGGER FINGER RELEASE Right 10/05/2020   Procedure: RELEASE TRIGGER FINGER RIGHT LONG FINGER;  Surgeon: Tarry Kos, MD;  Location: [facility];  Service: Orthopedics;  Laterality: Right;    There were no vitals filed for this visit.   Subjective Assessment - 11/18/20 0726    Subjective  Pt reports mild pain    Pertinent History R long finger trigger finger release 10/05/20 (initial injury approx over 1 year ago due to fall). Pt has spinal cord stimulator PMH:  DM, HTN, hyperlipidemia, hx of chest pain, hx of multiple back surgeries due to injury, hx of Laparoscopic gastic banding, hx of GERD,  hx of L hand injury approx 15 yrs ago (with decr functional use of digits 4-5)    Limitations hx of L hand injury approx 15 yrs ago (with decr functional use of digits 4-5), spinal cord stimulator- probably should refrain from estim    Patient  Stated Goals improve R hand functional use    Currently in Pain? Yes    Pain Score 2     Pain Location Finger (Comment which one)    Pain Orientation Right    Pain Descriptors / Indicators Aching    Pain Type Chronic pain    Pain Onset More than a month ago    Pain Frequency Constant    Aggravating Factors  movement    Pain Relieving Factors rest, heat                  Treatment: Fluidotherapy x 10 mins, for pain/ stiffness and desensitization, no adverse reactions [place] 3 mhz, 0.8 w/cm 2, 20% to palmar incision and digits for scar mobilization.               OT Treatment/ Education - 11/18/20 0730    Education Details Scar massage, tendon gliding, isolated hook fist, use of buddy strap to assist with exercises while tendon gliding, composite finger flexion/ extension,isolated PIP blocking for middle finger    Person(s) Educated Patient    Methods Explanation;Demonstration;Verbal cues;Tactile cues    Comprehension Verbalized understanding;Returned demonstration;Verbal cues required            OT Short Term Goals - 11/14/20 0905      OT SHORT TERM GOAL #1   Title Pt will be independent with  ROM HEP.--check STGs 12/14/20    Time 4    Period Weeks    Status New      OT SHORT TERM GOAL #2   Title Pt will demo at least 90* R 3rd digit PIP flex for improved grasp of objects.    Baseline 80*    Time 4    Period Weeks    Status New      OT SHORT TERM GOAL #3   Title Pt will demo at least 80* R 3rd digit MP flex for improved grasp of objects.    Baseline 65*    Time 4    Period Weeks    Status New      OT SHORT TERM GOAL #4   Title Pt will demo at least 60* R 3rd digit DIP flex for improved grasp of objects.    Baseline 45*    Time 4    Period Weeks    Status New      OT SHORT TERM GOAL #5   Title Pt will be independent with scar massage and splint wear/care (if needed).    Baseline --    Time 4    Period Weeks    Status New      Additional Short  Term Goals   Additional Short Term Goals --      OT SHORT TERM GOAL #6   Title --    Baseline --    Time --    Period --    Status --      OT SHORT TERM GOAL #7   Title --    Time --    Period --    Status --             OT Long Term Goals - 11/14/20 0909      OT LONG TERM GOAL #1   Title Pt will be independent with strengthening HEP.--check LTGs 01/13/21    Time 8    Period Weeks    Status New      OT LONG TERM GOAL #2   Title Pt will demo at least 60lbs R grip strength for lifting/picking up objects.    Baseline 48lbs    Time 8    Period Weeks    Status New      OT LONG TERM GOAL #3   Title Pt will report pain less than or equal to 2/10 with R hand functional use for light ADLs/IADLs.    Time 8    Period Weeks    Status New      OT LONG TERM GOAL #4   Title Pt will improve R coordination for ADLs/picking up pills/small objects as shown by completing 9-hole peg test in 21sec or less.    Baseline 26.28sec    Time 8    Period Weeks    Status New      OT LONG TERM GOAL #5   Title Pt will improve R 3rd digit PIP ext to at least -15* for incr ease in putting hand in pockets/gloves and grasp.    Baseline -30*    Time 8    Period Weeks    Status New      OT LONG TERM GOAL #6   Title Pt will demo at least 100* R 3rd digit PIP flex for improved grasp of objects.    Baseline 80*    Time 8    Period Weeks    Status New  OT LONG TERM GOAL #7   Title Pt will demo at least 90* R 3rd digit MP flex for improved grasp of objects.    Baseline 65*    Time 8    Period Weeks    Status New                 Plan - 11/18/20 2993    Clinical Impression Statement Pt is progressing towards goals  with improved ROM at end of session.    OT Occupational Profile and History Detailed Assessment- Review of Records and additional review of physical, cognitive, psychosocial history related to current functional performance    Occupational performance deficits (Please  refer to evaluation for details): ADL's;IADL's;Leisure    Body Structure / Function / Physical Skills Dexterity;Pain;Strength;Decreased knowledge of use of DME;ADL;Edema;UE functional use;ROM;IADL;Sensation;Coordination;FMC;Scar mobility    Rehab Potential Good    Clinical Decision Making Several treatment options, min-mod task modification necessary    Comorbidities Affecting Occupational Performance: May have comorbidities impacting occupational performance    Modification or Assistance to Complete Evaluation  Min-Moderate modification of tasks or assist with assess necessary to complete eval    OT Frequency 2x / week    OT Duration 8 weeks   +eval (or 17 visits)   OT Treatment/Interventions Self-care/ADL training;Moist Heat;Fluidtherapy;DME and/or AE instruction;Splinting;Contrast Bath;Therapeutic activities;Scar mobilization;Therapeutic exercise;Ultrasound;Cryotherapy;Iontophoresis;Neuromuscular education;Passive range of motion;Patient/family education;Manual Therapy;Paraffin;Electrical Stimulation    Plan ultrasound/fludiocontinue ROM,  scar massage    Consulted and Agree with Plan of Care Patient           Patient will benefit from skilled therapeutic intervention in order to improve the following deficits and impairments:   Body Structure / Function / Physical Skills: Dexterity,Pain,Strength,Decreased knowledge of use of DME,ADL,Edema,UE functional use,ROM,IADL,Sensation,Coordination,FMC,Scar mobility       Visit Diagnosis: Stiffness of right hand, not elsewhere classified  Muscle weakness (generalized)  Localized edema  Other lack of coordination  Pain in joint of right hand    Problem List Patient Active Problem List   Diagnosis Date Noted  . Trigger finger, right middle finger 10/05/2020  . Toe pain, chronic, left 10/07/2018  . Hyperlipidemia associated with type 2 diabetes mellitus (HCC) 09/19/2018  . Hyperlipidemia 03/18/2018  . Dyspnea on exertion 03/18/2018   . Chest pain 03/18/2018  . Diabetes mellitus without complication (HCC) 11/11/2017  . Essential hypertension 11/11/2017    Sarita Hakanson 11/18/2020, 8:09 AM Keene Breath, OTR/L [phone] Phone: [phone] 8:11 AM 11/18/20 [facility] [address] [place], [place], [address] Phone: [phone]   Fax:  [phone]  Name: Kevin [name] MRN: [id number] Date of Birth: [birth year]

## 2020-11-21 ENCOUNTER — Other Ambulatory Visit: Payer: Self-pay | Admitting: Nurse Practitioner

## 2020-11-21 DIAGNOSIS — M1710 Unilateral primary osteoarthritis, unspecified knee: Secondary | ICD-10-CM

## 2020-11-22 ENCOUNTER — Ambulatory Visit: Payer: Self-pay | Admitting: Occupational Therapy

## 2020-11-22 ENCOUNTER — Encounter: Payer: Self-pay | Admitting: Occupational Therapy

## 2020-11-22 ENCOUNTER — Other Ambulatory Visit: Payer: Self-pay

## 2020-11-22 DIAGNOSIS — R600 Localized edema: Secondary | ICD-10-CM

## 2020-11-22 DIAGNOSIS — R278 Other lack of coordination: Secondary | ICD-10-CM

## 2020-11-22 DIAGNOSIS — M6281 Muscle weakness (generalized): Secondary | ICD-10-CM

## 2020-11-22 DIAGNOSIS — M25641 Stiffness of right hand, not elsewhere classified: Secondary | ICD-10-CM

## 2020-11-22 DIAGNOSIS — M25541 Pain in joints of right hand: Secondary | ICD-10-CM

## 2020-11-22 NOTE — Therapy (Signed)
[facility] [address] [place], [place], [address] Phone: [phone]   Fax:  [phone]  Occupational Therapy Treatment  Patient Details  Name: Kevin [name] MRN: [id number] Date of Birth: [birth year] Referring Provider (OT): Virgia Land. Dub Mikes, [place]   Encounter Date: 11/22/2020   OT End of Session - 11/22/20 0722    Visit Number 3    Number of Visits 17    Date for OT Re-Evaluation 01/13/21    Authorization Type CAFA 100% 08/15/20-02/13/21    OT Start Time 0720    OT Stop Time 0800    OT Time Calculation (min) 40 min    Activity Tolerance Patient tolerated treatment well    Behavior During Therapy [facility] for tasks assessed/performed           Past Medical History:  Diagnosis Date  . COVID 04/20/2020  . Diabetes mellitus without complication (HCC)   . GERD (gastroesophageal reflux disease)   . Hypertension   . MRSA (methicillin resistant Staphylococcus aureus)     Past Surgical History:  Procedure Laterality Date  . BACK SURGERY    . LAPAROSCOPIC GASTRIC BANDING    . TRIGGER FINGER RELEASE Right 10/05/2020   Procedure: RELEASE TRIGGER FINGER RIGHT LONG FINGER;  Surgeon: Tarry Kos, MD;  Location: [facility];  Service: Orthopedics;  Laterality: Right;    There were no vitals filed for this visit.   Subjective Assessment - 11/22/20 0721    Subjective  It's been more sore    Pertinent History R long finger trigger finger release 10/05/20 (initial injury approx over 1 year ago due to fall). Pt has spinal cord stimulator PMH:  DM, HTN, hyperlipidemia, hx of chest pain, hx of multiple back surgeries due to injury, hx of Laparoscopic gastic banding, hx of GERD,  hx of L hand injury approx 15 yrs ago (with decr functional use of digits 4-5)    Limitations hx of L hand injury approx 15 yrs ago (with decr functional use of digits 4-5), spinal cord stimulator- probably should refrain from estim    Patient  Stated Goals improve R hand functional use    Currently in Pain? Yes    Pain Score 3    6/10 with exercise   Pain Location Hand    Pain Orientation Right    Pain Descriptors / Indicators Sore;Burning    Pain Type Chronic pain    Pain Onset More than a month ago    Pain Frequency Constant    Aggravating Factors  movement    Pain Relieving Factors rest, heat            Fluidotherapy x 10 mins, for pain/ stiffness and desensitization, no adverse reactions  Ultrasound 3 mhz, 0.8 w/cm2, 20% to palmar incision and digits for scar mobilization.  AROM:  Tendon glides, isolated IP flexion, isolated MP flex, blocked PIP flex, with MP in flex isolated PIP ext.  (performed after fludio and then after ultrasound).  AAROM composite flex.         OT Education - 11/22/20 0802    Education Details Reviewed scar massage    Person(s) Educated Patient    Methods Explanation;Demonstration;Verbal cues    Comprehension Verbalized understanding            OT Short Term Goals - 11/14/20 0905      OT SHORT TERM GOAL #1   Title Pt will be independent with ROM HEP.--check STGs 12/14/20    Time  4    Period Weeks    Status New      OT SHORT TERM GOAL #2   Title Pt will demo at least 90* R 3rd digit PIP flex for improved grasp of objects.    Baseline 80*    Time 4    Period Weeks    Status New      OT SHORT TERM GOAL #3   Title Pt will demo at least 80* R 3rd digit MP flex for improved grasp of objects.    Baseline 65*    Time 4    Period Weeks    Status New      OT SHORT TERM GOAL #4   Title Pt will demo at least 60* R 3rd digit DIP flex for improved grasp of objects.    Baseline 45*    Time 4    Period Weeks    Status New      OT SHORT TERM GOAL #5   Title Pt will be independent with scar massage and splint wear/care (if needed).    Baseline --    Time 4    Period Weeks    Status New      Additional Short Term Goals   Additional Short Term Goals --      OT SHORT TERM  GOAL #6   Title --    Baseline --    Time --    Period --    Status --      OT SHORT TERM GOAL #7   Title --    Time --    Period --    Status --             OT Long Term Goals - 11/14/20 0909      OT LONG TERM GOAL #1   Title Pt will be independent with strengthening HEP.--check LTGs 01/13/21    Time 8    Period Weeks    Status New      OT LONG TERM GOAL #2   Title Pt will demo at least 60lbs R grip strength for lifting/picking up objects.    Baseline 48lbs    Time 8    Period Weeks    Status New      OT LONG TERM GOAL #3   Title Pt will report pain less than or equal to 2/10 with R hand functional use for light ADLs/IADLs.    Time 8    Period Weeks    Status New      OT LONG TERM GOAL #4   Title Pt will improve R coordination for ADLs/picking up pills/small objects as shown by completing 9-hole peg test in 21sec or less.    Baseline 26.28sec    Time 8    Period Weeks    Status New      OT LONG TERM GOAL #5   Title Pt will improve R 3rd digit PIP ext to at least -15* for incr ease in putting hand in pockets/gloves and grasp.    Baseline -30*    Time 8    Period Weeks    Status New      OT LONG TERM GOAL #6   Title Pt will demo at least 100* R 3rd digit PIP flex for improved grasp of objects.    Baseline 80*    Time 8    Period Weeks    Status New      OT LONG TERM GOAL #7  Title Pt will demo at least 90* R 3rd digit MP flex for improved grasp of objects.    Baseline 65*    Time 8    Period Weeks    Status New                 Plan - 11/22/20 6948    Clinical Impression Statement Pt is progressing towards goals  with improved ROM at end of session.    OT Occupational Profile and History Detailed Assessment- Review of Records and additional review of physical, cognitive, psychosocial history related to current functional performance    Occupational performance deficits (Please refer to evaluation for details): ADL's;IADL's;Leisure    Body  Structure / Function / Physical Skills Dexterity;Pain;Strength;Decreased knowledge of use of DME;ADL;Edema;UE functional use;ROM;IADL;Sensation;Coordination;FMC;Scar mobility    Rehab Potential Good    Clinical Decision Making Several treatment options, min-mod task modification necessary    Comorbidities Affecting Occupational Performance: May have comorbidities impacting occupational performance    Modification or Assistance to Complete Evaluation  Min-Moderate modification of tasks or assist with assess necessary to complete eval    OT Frequency 2x / week    OT Duration 8 weeks   +eval (or 17 visits)   OT Treatment/Interventions Self-care/ADL training;Moist Heat;Fluidtherapy;DME and/or AE instruction;Splinting;Contrast Bath;Therapeutic activities;Scar mobilization;Therapeutic exercise;Ultrasound;Cryotherapy;Iontophoresis;Neuromuscular education;Passive range of motion;Patient/family education;Manual Therapy;Paraffin;Electrical Stimulation    Plan ultrasound/fludio,vcontinue ROM,  scar massage    Consulted and Agree with Plan of Care Patient           Patient will benefit from skilled therapeutic intervention in order to improve the following deficits and impairments:   Body Structure / Function / Physical Skills: Dexterity,Pain,Strength,Decreased knowledge of use of DME,ADL,Edema,UE functional use,ROM,IADL,Sensation,Coordination,FMC,Scar mobility       Visit Diagnosis: Stiffness of right hand, not elsewhere classified  Muscle weakness (generalized)  Localized edema  Other lack of coordination  Pain in joint of right hand    Problem List Patient Active Problem List   Diagnosis Date Noted  . Trigger finger, right middle finger 10/05/2020  . Toe pain, chronic, left 10/07/2018  . Hyperlipidemia associated with type 2 diabetes mellitus (HCC) 09/19/2018  . Hyperlipidemia 03/18/2018  . Dyspnea on exertion 03/18/2018  . Chest pain 03/18/2018  . Diabetes mellitus without  complication (HCC) 11/11/2017  . Essential hypertension 11/11/2017    [facility] 11/22/2020, 8:02 AM  [facility] [address] [place], [place], [address] Phone: [phone]   Fax:  [phone]  Name: Kevin [name] MRN: [id number] Date of Birth: [birth year]   Willa Frater, OTR/L [facility] [address] [place], [place]  [address] [phone] phone [phone] 11/22/20 8:02 AM

## 2020-11-23 ENCOUNTER — Ambulatory Visit: Payer: Self-pay | Admitting: Occupational Therapy

## 2020-11-23 ENCOUNTER — Encounter: Payer: Self-pay | Admitting: Occupational Therapy

## 2020-11-23 DIAGNOSIS — M25641 Stiffness of right hand, not elsewhere classified: Secondary | ICD-10-CM

## 2020-11-23 DIAGNOSIS — M6281 Muscle weakness (generalized): Secondary | ICD-10-CM

## 2020-11-23 DIAGNOSIS — R600 Localized edema: Secondary | ICD-10-CM

## 2020-11-23 DIAGNOSIS — M25541 Pain in joints of right hand: Secondary | ICD-10-CM

## 2020-11-23 DIAGNOSIS — R278 Other lack of coordination: Secondary | ICD-10-CM

## 2020-11-23 NOTE — Therapy (Signed)
[facility] [address] [place], [place], [address] Phone: [phone]   Fax:  [phone]  Occupational Therapy Treatment  Patient Details  Name: Kevin [name] MRN: [id number] Date of Birth: [birth year] Referring Provider (OT): Virgia Land. Dub Mikes, [place]   Encounter Date: 11/23/2020   OT End of Session - 11/23/20 0730    Visit Number 4    Number of Visits 17    Date for OT Re-Evaluation 01/13/21    Authorization Type CAFA 100% 08/15/20-02/13/21    OT Start Time 0719    OT Stop Time 0759    OT Time Calculation (min) 40 min    Activity Tolerance Patient tolerated treatment well    Behavior During Therapy [facility] for tasks assessed/performed           Past Medical History:  Diagnosis Date  . COVID 04/20/2020  . Diabetes mellitus without complication (HCC)   . GERD (gastroesophageal reflux disease)   . Hypertension   . MRSA (methicillin resistant Staphylococcus aureus)     Past Surgical History:  Procedure Laterality Date  . BACK SURGERY    . LAPAROSCOPIC GASTRIC BANDING    . TRIGGER FINGER RELEASE Right 10/05/2020   Procedure: RELEASE TRIGGER FINGER RIGHT LONG FINGER;  Surgeon: Tarry Kos, MD;  Location: [facility];  Service: Orthopedics;  Laterality: Right;    There were no vitals filed for this visit.   Subjective Assessment - 11/23/20 0728    Subjective  It's been more sore since exercises yesterday    Pertinent History R long finger trigger finger release 10/05/20 (initial injury approx over 1 year ago due to fall). Pt has spinal cord stimulator PMH:  DM, HTN, hyperlipidemia, hx of chest pain, hx of multiple back surgeries due to injury, hx of Laparoscopic gastic banding, hx of GERD,  hx of L hand injury approx 15 yrs ago (with decr functional use of digits 4-5)    Limitations hx of L hand injury approx 15 yrs ago (with decr functional use of digits 4-5), spinal cord stimulator- probably should  refrain from estim    Patient Stated Goals improve R hand functional use    Currently in Pain? Yes    Pain Score 4     Pain Location Hand    Pain Orientation Right    Pain Descriptors / Indicators Aching    Pain Onset More than a month ago    Pain Frequency Constant    Aggravating Factors  exercise    Pain Relieving Factors rest, heat                 Fluidotherapy x 10 mins, for pain/ stiffness and desensitization, no adverse reactions  Ultrasound 3 mhz, 0.8 w/cm2, 20% to palmar incision and digits for scar mobilization.  AROM:  Tendon glides, isolated IP flexion, isolated MP flex, blocked PIP flex, with MP in flex isolated PIP ext.  P/ROM composite flex, place and hold exercises  Ice pack x 8 mins to RUE end of session no adverse reactions.                OT Short Term Goals - 11/14/20 0905      OT SHORT TERM GOAL #1   Title Pt will be independent with ROM HEP.--check STGs 12/14/20    Time 4    Period Weeks    Status New      OT SHORT TERM GOAL #2   Title Pt will demo at  least 90* R 3rd digit PIP flex for improved grasp of objects.    Baseline 80*    Time 4    Period Weeks    Status New      OT SHORT TERM GOAL #3   Title Pt will demo at least 80* R 3rd digit MP flex for improved grasp of objects.    Baseline 65*    Time 4    Period Weeks    Status New      OT SHORT TERM GOAL #4   Title Pt will demo at least 60* R 3rd digit DIP flex for improved grasp of objects.    Baseline 45*    Time 4    Period Weeks    Status New      OT SHORT TERM GOAL #5   Title Pt will be independent with scar massage and splint wear/care (if needed).    Baseline --    Time 4    Period Weeks    Status New      Additional Short Term Goals   Additional Short Term Goals --      OT SHORT TERM GOAL #6   Title --    Baseline --    Time --    Period --    Status --      OT SHORT TERM GOAL #7   Title --    Time --    Period --    Status --              OT Long Term Goals - 11/14/20 0909      OT LONG TERM GOAL #1   Title Pt will be independent with strengthening HEP.--check LTGs 01/13/21    Time 8    Period Weeks    Status New      OT LONG TERM GOAL #2   Title Pt will demo at least 60lbs R grip strength for lifting/picking up objects.    Baseline 48lbs    Time 8    Period Weeks    Status New      OT LONG TERM GOAL #3   Title Pt will report pain less than or equal to 2/10 with R hand functional use for light ADLs/IADLs.    Time 8    Period Weeks    Status New      OT LONG TERM GOAL #4   Title Pt will improve R coordination for ADLs/picking up pills/small objects as shown by completing 9-hole peg test in 21sec or less.    Baseline 26.28sec    Time 8    Period Weeks    Status New      OT LONG TERM GOAL #5   Title Pt will improve R 3rd digit PIP ext to at least -15* for incr ease in putting hand in pockets/gloves and grasp.    Baseline -30*    Time 8    Period Weeks    Status New      OT LONG TERM GOAL #6   Title Pt will demo at least 100* R 3rd digit PIP flex for improved grasp of objects.    Baseline 80*    Time 8    Period Weeks    Status New      OT LONG TERM GOAL #7   Title Pt will demo at least 90* R 3rd digit MP flex for improved grasp of objects.    Baseline 65*    Time 8  Period Weeks    Status New                 Plan - 11/23/20 0729    Clinical Impression Statement Pt is progressing towards goals. he reports increased pain today after exercising yesterday, pt's ROM is improving.    OT Occupational Profile and History Detailed Assessment- Review of Records and additional review of physical, cognitive, psychosocial history related to current functional performance    Occupational performance deficits (Please refer to evaluation for details): ADL's;IADL's;Leisure    Body Structure / Function / Physical Skills Dexterity;Pain;Strength;Decreased knowledge of use of DME;ADL;Edema;UE functional  use;ROM;IADL;Sensation;Coordination;FMC;Scar mobility    Rehab Potential Good    Clinical Decision Making Several treatment options, min-mod task modification necessary    Comorbidities Affecting Occupational Performance: May have comorbidities impacting occupational performance    Modification or Assistance to Complete Evaluation  Min-Moderate modification of tasks or assist with assess necessary to complete eval    OT Frequency 2x / week    OT Duration 8 weeks   +eval (or 17 visits)   OT Treatment/Interventions Self-care/ADL training;Moist Heat;Fluidtherapy;DME and/or AE instruction;Splinting;Contrast Bath;Therapeutic activities;Scar mobilization;Therapeutic exercise;Ultrasound;Cryotherapy;Iontophoresis;Neuromuscular education;Passive range of motion;Patient/family education;Manual Therapy;Paraffin;Electrical Stimulation    Plan ultrasound/fludio,continue ROM,  scar massage    Consulted and Agree with Plan of Care Patient           Patient will benefit from skilled therapeutic intervention in order to improve the following deficits and impairments:   Body Structure / Function / Physical Skills: Dexterity,Pain,Strength,Decreased knowledge of use of DME,ADL,Edema,UE functional use,ROM,IADL,Sensation,Coordination,FMC,Scar mobility       Visit Diagnosis: Stiffness of right hand, not elsewhere classified  Muscle weakness (generalized)  Localized edema  Other lack of coordination  Pain in joint of right hand    Problem List Patient Active Problem List   Diagnosis Date Noted  . Trigger finger, right middle finger 10/05/2020  . Toe pain, chronic, left 10/07/2018  . Hyperlipidemia associated with type 2 diabetes mellitus (HCC) 09/19/2018  . Hyperlipidemia 03/18/2018  . Dyspnea on exertion 03/18/2018  . Chest pain 03/18/2018  . Diabetes mellitus without complication (HCC) 11/11/2017  . Essential hypertension 11/11/2017    Kevin [name] 11/23/2020, 7:30 AM  [facility] [address] [place], [place], [address] Phone: [phone]   Fax:  [phone]  Name: Kevin [name] MRN: [id number] Date of Birth: 1966-09-08

## 2020-11-28 ENCOUNTER — Other Ambulatory Visit: Payer: Self-pay

## 2020-11-28 ENCOUNTER — Ambulatory Visit: Payer: Self-pay | Admitting: Occupational Therapy

## 2020-11-28 DIAGNOSIS — R278 Other lack of coordination: Secondary | ICD-10-CM

## 2020-11-28 DIAGNOSIS — M6281 Muscle weakness (generalized): Secondary | ICD-10-CM

## 2020-11-28 DIAGNOSIS — M25641 Stiffness of right hand, not elsewhere classified: Secondary | ICD-10-CM

## 2020-11-28 DIAGNOSIS — M25541 Pain in joints of right hand: Secondary | ICD-10-CM

## 2020-11-28 DIAGNOSIS — R600 Localized edema: Secondary | ICD-10-CM

## 2020-11-28 NOTE — Therapy (Signed)
[facility] [address] [place], [place], [address] Phone: [phone]   Fax:  [phone]  Occupational Therapy Treatment  Patient Details  Name: Kevin [name] MRN: [id number] Date of Birth: 11-10-1965 Referring Provider (OT): Virgia Land. Dub Mikes, [place]   Encounter Date: 11/28/2020   OT End of Session - 11/28/20 0731    Visit Number 5    Number of Visits 17    Date for OT Re-Evaluation 01/13/21    Authorization Type CAFA 100% 08/15/20-02/13/21    OT Start Time 0721    OT Stop Time 0800    OT Time Calculation (min) 39 min    Activity Tolerance Patient tolerated treatment well    Behavior During Therapy [facility] for tasks assessed/performed           Past Medical History:  Diagnosis Date  . COVID 04/20/2020  . Diabetes mellitus without complication (HCC)   . GERD (gastroesophageal reflux disease)   . Hypertension   . MRSA (methicillin resistant Staphylococcus aureus)     Past Surgical History:  Procedure Laterality Date  . BACK SURGERY    . LAPAROSCOPIC GASTRIC BANDING    . TRIGGER FINGER RELEASE Right 10/05/2020   Procedure: RELEASE TRIGGER FINGER RIGHT LONG FINGER;  Surgeon: Tarry Kos, MD;  Location: [facility];  Service: Orthopedics;  Laterality: Right;    There were no vitals filed for this visit.   Subjective Assessment - 11/28/20 0729    Subjective  It's sore    Pertinent History R long finger trigger finger release 10/05/20 (initial injury approx over 1 year ago due to fall). Pt has spinal cord stimulator PMH:  DM, HTN, hyperlipidemia, hx of chest pain, hx of multiple back surgeries due to injury, hx of Laparoscopic gastic banding, hx of GERD,  hx of L hand injury approx 15 yrs ago (with decr functional use of digits 4-5)    Limitations hx of L hand injury approx 15 yrs ago (with decr functional use of digits 4-5), spinal cord stimulator- probably should refrain from estim    Patient Stated  Goals improve R hand functional use    Currently in Pain? Yes    Pain Score 3     Pain Location Hand    Pain Orientation Right    Pain Descriptors / Indicators Aching    Pain Type Chronic pain    Pain Onset More than a month ago    Pain Frequency Constant    Aggravating Factors  exercise    Pain Relieving Factors rest, heat              Paraffin x 10 mins for pain/ stiffness, no adverse reactions  Ultrasound 3 mhz, 0.8 w/cm2, 20% to palmar incision and digits for scar mobilization.  AROM:  Tendon glides, isolated IP flexion, isolated MP flex, blocked PIP flex,  isolated PIP ext.  with MP in flex  Place and holds in composite flex  PROM composite flex and PIP extension         OT Short Term Goals - 11/14/20 0905      OT SHORT TERM GOAL #1   Title Pt will be independent with ROM HEP.--check STGs 12/14/20    Time 4    Period Weeks    Status New      OT SHORT TERM GOAL #2   Title Pt will demo at least 90* R 3rd digit PIP flex for improved grasp of objects.    Baseline  80*    Time 4    Period Weeks    Status New      OT SHORT TERM GOAL #3   Title Pt will demo at least 80* R 3rd digit MP flex for improved grasp of objects.    Baseline 65*    Time 4    Period Weeks    Status New      OT SHORT TERM GOAL #4   Title Pt will demo at least 60* R 3rd digit DIP flex for improved grasp of objects.    Baseline 45*    Time 4    Period Weeks    Status New      OT SHORT TERM GOAL #5   Title Pt will be independent with scar massage and splint wear/care (if needed).    Baseline --    Time 4    Period Weeks    Status New      Additional Short Term Goals   Additional Short Term Goals --      OT SHORT TERM GOAL #6   Title --    Baseline --    Time --    Period --    Status --      OT SHORT TERM GOAL #7   Title --    Time --    Period --    Status --             OT Long Term Goals - 11/14/20 0909      OT LONG TERM GOAL #1   Title Pt will be  independent with strengthening HEP.--check LTGs 01/13/21    Time 8    Period Weeks    Status New      OT LONG TERM GOAL #2   Title Pt will demo at least 60lbs R grip strength for lifting/picking up objects.    Baseline 48lbs    Time 8    Period Weeks    Status New      OT LONG TERM GOAL #3   Title Pt will report pain less than or equal to 2/10 with R hand functional use for light ADLs/IADLs.    Time 8    Period Weeks    Status New      OT LONG TERM GOAL #4   Title Pt will improve R coordination for ADLs/picking up pills/small objects as shown by completing 9-hole peg test in 21sec or less.    Baseline 26.28sec    Time 8    Period Weeks    Status New      OT LONG TERM GOAL #5   Title Pt will improve R 3rd digit PIP ext to at least -15* for incr ease in putting hand in pockets/gloves and grasp.    Baseline -30*    Time 8    Period Weeks    Status New      OT LONG TERM GOAL #6   Title Pt will demo at least 100* R 3rd digit PIP flex for improved grasp of objects.    Baseline 80*    Time 8    Period Weeks    Status New      OT LONG TERM GOAL #7   Title Pt will demo at least 90* R 3rd digit MP flex for improved grasp of objects.    Baseline 65*    Time 8    Period Weeks    Status New  Plan - 11/28/20 0732    Clinical Impression Statement Pt is progressing towards goals. Pt's ROM is improving.  Pt continues to report soreness, but able to touch tip of finger to palm at end of session.    OT Occupational Profile and History Detailed Assessment- Review of Records and additional review of physical, cognitive, psychosocial history related to current functional performance    Occupational performance deficits (Please refer to evaluation for details): ADL's;IADL's;Leisure    Body Structure / Function / Physical Skills Dexterity;Pain;Strength;Decreased knowledge of use of DME;ADL;Edema;UE functional use;ROM;IADL;Sensation;Coordination;FMC;Scar mobility     Rehab Potential Good    Clinical Decision Making Several treatment options, min-mod task modification necessary    Comorbidities Affecting Occupational Performance: May have comorbidities impacting occupational performance    Modification or Assistance to Complete Evaluation  Min-Moderate modification of tasks or assist with assess necessary to complete eval    OT Frequency 2x / week    OT Duration 8 weeks   +eval (or 17 visits)   OT Treatment/Interventions Self-care/ADL training;Moist Heat;Fluidtherapy;DME and/or AE instruction;Splinting;Contrast Bath;Therapeutic activities;Scar mobilization;Therapeutic exercise;Ultrasound;Cryotherapy;Iontophoresis;Neuromuscular education;Passive range of motion;Patient/family education;Manual Therapy;Paraffin;Electrical Stimulation    Plan ultrasound/fludio,continue ROM, ?putty HEP    Consulted and Agree with Plan of Care Patient           Patient will benefit from skilled therapeutic intervention in order to improve the following deficits and impairments:   Body Structure / Function / Physical Skills: Dexterity,Pain,Strength,Decreased knowledge of use of DME,ADL,Edema,UE functional use,ROM,IADL,Sensation,Coordination,FMC,Scar mobility       Visit Diagnosis: Stiffness of right hand, not elsewhere classified  Muscle weakness (generalized)  Localized edema  Other lack of coordination  Pain in joint of right hand    Problem List Patient Active Problem List   Diagnosis Date Noted  . Trigger finger, right middle finger 10/05/2020  . Toe pain, chronic, left 10/07/2018  . Hyperlipidemia associated with type 2 diabetes mellitus (HCC) 09/19/2018  . Hyperlipidemia 03/18/2018  . Dyspnea on exertion 03/18/2018  . Chest pain 03/18/2018  . Diabetes mellitus without complication (HCC) 11/11/2017  . Essential hypertension 11/11/2017    [facility] 11/28/2020, 10:09 AM   [facility] [address] [place], [place], [address] Phone: [phone]   Fax:  [phone]  Name: Kevin [name] MRN: [id number] Date of Birth: 1966/04/13   Willa Frater, OTR/L [facility] [address] [place], [place]  [address] [phone] phone [phone] 11/28/20 10:09 AM

## 2020-11-30 ENCOUNTER — Other Ambulatory Visit: Payer: Self-pay

## 2020-11-30 ENCOUNTER — Ambulatory Visit: Payer: Self-pay | Admitting: Occupational Therapy

## 2020-11-30 DIAGNOSIS — R600 Localized edema: Secondary | ICD-10-CM

## 2020-11-30 DIAGNOSIS — M25641 Stiffness of right hand, not elsewhere classified: Secondary | ICD-10-CM

## 2020-11-30 DIAGNOSIS — M6281 Muscle weakness (generalized): Secondary | ICD-10-CM

## 2020-11-30 DIAGNOSIS — M25541 Pain in joints of right hand: Secondary | ICD-10-CM

## 2020-11-30 DIAGNOSIS — R278 Other lack of coordination: Secondary | ICD-10-CM

## 2020-11-30 NOTE — Therapy (Signed)
[facility] [address] [place], [place], [address] Phone: [phone]   Fax:  [phone]  Occupational Therapy Treatment  Patient Details  Name: Kevin [name] MRN: [id number] Date of Birth: [birth year] Referring Provider (OT): Virgia Land. Dub Mikes, [place]   Encounter Date: 11/30/2020   OT End of Session - 11/30/20 0723    Visit Number 6    Number of Visits 17    Date for OT Re-Evaluation 01/13/21    Authorization Type CAFA 100% 08/15/20-02/13/21    OT Start Time 0717    OT Stop Time 0757    OT Time Calculation (min) 40 min           Past Medical History:  Diagnosis Date  . COVID 04/20/2020  . Diabetes mellitus without complication (HCC)   . GERD (gastroesophageal reflux disease)   . Hypertension   . MRSA (methicillin resistant Staphylococcus aureus)     Past Surgical History:  Procedure Laterality Date  . BACK SURGERY    . LAPAROSCOPIC GASTRIC BANDING    . TRIGGER FINGER RELEASE Right 10/05/2020   Procedure: RELEASE TRIGGER FINGER RIGHT LONG FINGER;  Surgeon: Tarry Kos, MD;  Location: [facility];  Service: Orthopedics;  Laterality: Right;    There were no vitals filed for this visit.   Subjective Assessment - 11/30/20 0724    Subjective  Pt reports soreness this a.m.    Pertinent History R long finger trigger finger release 10/05/20 (initial injury approx over 1 year ago due to fall). Pt has spinal cord stimulator PMH:  DM, HTN, hyperlipidemia, hx of chest pain, hx of multiple back surgeries due to injury, hx of Laparoscopic gastic banding, hx of GERD,  hx of L hand injury approx 15 yrs ago (with decr functional use of digits 4-5)    Limitations hx of L hand injury approx 15 yrs ago (with decr functional use of digits 4-5), spinal cord stimulator- probably should refrain from estim    Patient Stated Goals improve R hand functional use    Currently in Pain? Yes    Pain Score 3     Pain Location  Hand    Pain Orientation Right    Pain Descriptors / Indicators Aching    Pain Type Chronic pain    Pain Onset More than a month ago    Pain Frequency Constant    Aggravating Factors  exercise    Pain Relieving Factors rest, heat                     Fluidotherapy x 10 mins for pain/ stiffness, no adverse reactions  Ultrasound 3 mhz, 0.8 w/cm2, 50% to palmar incision and digits for scar mobilization.  AROM:  Tendon glides, isolated IP flexion, isolated MP flex, blocked PIP flex/  isolated PIP ext.   Place and holds in composite flex  PROM composite flex , MP and PIP flexion   Grooved pegboard for fine motor coordination/ in hand manipulation, min difficulty/ v.c   Middle finger A/ROM : MP flexion 80 PIP flexion/ ext 85/-10,  DIP 40 composite finger flexion 90-95 % with stretching          OT Short Term Goals - 11/14/20 0905      OT SHORT TERM GOAL #1   Title Pt will be independent with ROM HEP.--check STGs 12/14/20    Time 4    Period Weeks    Status New      OT  SHORT TERM GOAL #2   Title Pt will demo at least 90* R 3rd digit PIP flex for improved grasp of objects.    Baseline 80*    Time 4    Period Weeks    Status New      OT SHORT TERM GOAL #3   Title Pt will demo at least 80* R 3rd digit MP flex for improved grasp of objects.    Baseline 65*    Time 4    Period Weeks    Status New      OT SHORT TERM GOAL #4   Title Pt will demo at least 60* R 3rd digit DIP flex for improved grasp of objects.    Baseline 45*    Time 4    Period Weeks    Status New      OT SHORT TERM GOAL #5   Title Pt will be independent with scar massage and splint wear/care (if needed).    Baseline --    Time 4    Period Weeks    Status New      Additional Short Term Goals   Additional Short Term Goals --      OT SHORT TERM GOAL #6   Title --    Baseline --    Time --    Period --    Status --      OT SHORT TERM GOAL #7   Title --    Time --    Period  --    Status --             OT Long Term Goals - 11/14/20 0909      OT LONG TERM GOAL #1   Title Pt will be independent with strengthening HEP.--check LTGs 01/13/21    Time 8    Period Weeks    Status New      OT LONG TERM GOAL #2   Title Pt will demo at least 60lbs R grip strength for lifting/picking up objects.    Baseline 48lbs    Time 8    Period Weeks    Status New      OT LONG TERM GOAL #3   Title Pt will report pain less than or equal to 2/10 with R hand functional use for light ADLs/IADLs.    Time 8    Period Weeks    Status New      OT LONG TERM GOAL #4   Title Pt will improve R coordination for ADLs/picking up pills/small objects as shown by completing 9-hole peg test in 21sec or less.    Baseline 26.28sec    Time 8    Period Weeks    Status New      OT LONG TERM GOAL #5   Title Pt will improve R 3rd digit PIP ext to at least -15* for incr ease in putting hand in pockets/gloves and grasp.    Baseline -30*    Time 8    Period Weeks    Status New      OT LONG TERM GOAL #6   Title Pt will demo at least 100* R 3rd digit PIP flex for improved grasp of objects.    Baseline 80*    Time 8    Period Weeks    Status New      OT LONG TERM GOAL #7   Title Pt will demo at least 90* R 3rd digit MP flex for improved grasp of  objects.    Baseline 65*    Time 8    Period Weeks    Status New                 Plan - 11/30/20 1218    Clinical Impression Statement Pt is progressing towards goals. Pt continues to report soreness,  however he demonstrates overall improvements in ROM. Pt. sees MD this week.    OT Occupational Profile and History Detailed Assessment- Review of Records and additional review of physical, cognitive, psychosocial history related to current functional performance    Occupational performance deficits (Please refer to evaluation for details): ADL's;IADL's;Leisure    Body Structure / Function / Physical Skills  Dexterity;Pain;Strength;Decreased knowledge of use of DME;ADL;Edema;UE functional use;ROM;IADL;Sensation;Coordination;FMC;Scar mobility    Rehab Potential Good    Clinical Decision Making Several treatment options, min-mod task modification necessary    Comorbidities Affecting Occupational Performance: May have comorbidities impacting occupational performance    Modification or Assistance to Complete Evaluation  Min-Moderate modification of tasks or assist with assess necessary to complete eval    OT Frequency 2x / week    OT Duration 8 weeks   +eval (or 17 visits)   OT Treatment/Interventions Self-care/ADL training;Moist Heat;Fluidtherapy;DME and/or AE instruction;Splinting;Contrast Bath;Therapeutic activities;Scar mobilization;Therapeutic exercise;Ultrasound;Cryotherapy;Iontophoresis;Neuromuscular education;Passive range of motion;Patient/family education;Manual Therapy;Paraffin;Electrical Stimulation    Plan ultrasound/fludio,continue ROM, ?putty HEP    Consulted and Agree with Plan of Care Patient           Patient will benefit from skilled therapeutic intervention in order to improve the following deficits and impairments:   Body Structure / Function / Physical Skills: Dexterity,Pain,Strength,Decreased knowledge of use of DME,ADL,Edema,UE functional use,ROM,IADL,Sensation,Coordination,FMC,Scar mobility       Visit Diagnosis: Stiffness of right hand, not elsewhere classified  Muscle weakness (generalized)  Localized edema  Other lack of coordination  Pain in joint of right hand    Problem List Patient Active Problem List   Diagnosis Date Noted  . Trigger finger, right middle finger 10/05/2020  . Toe pain, chronic, left 10/07/2018  . Hyperlipidemia associated with type 2 diabetes mellitus (HCC) 09/19/2018  . Hyperlipidemia 03/18/2018  . Dyspnea on exertion 03/18/2018  . Chest pain 03/18/2018  . Diabetes mellitus without complication (HCC) 11/11/2017  . Essential  hypertension 11/11/2017    Kaysen Sefcik 11/30/2020, 12:20 PM  [facility] [address] [place], [place], [address] Phone: [phone]   Fax:  [phone]  Name: Kevin [name] MRN: [id number] Date of Birth: [birth year]

## 2020-12-01 ENCOUNTER — Encounter: Payer: Self-pay | Admitting: Orthopaedic Surgery

## 2020-12-01 ENCOUNTER — Ambulatory Visit (INDEPENDENT_AMBULATORY_CARE_PROVIDER_SITE_OTHER): Payer: Self-pay | Admitting: Orthopaedic Surgery

## 2020-12-01 ENCOUNTER — Other Ambulatory Visit: Payer: Self-pay | Admitting: Orthopaedic Surgery

## 2020-12-01 DIAGNOSIS — M1711 Unilateral primary osteoarthritis, right knee: Secondary | ICD-10-CM

## 2020-12-01 DIAGNOSIS — M65331 Trigger finger, right middle finger: Secondary | ICD-10-CM

## 2020-12-01 DIAGNOSIS — M19049 Primary osteoarthritis, unspecified hand: Secondary | ICD-10-CM | POA: Insufficient documentation

## 2020-12-01 MED ORDER — PREDNISONE 10 MG (21) PO TBPK: ORAL_TABLET | ORAL | 0 refills | Status: DC

## 2020-12-01 MED FILL — predniSONE 10 MG TABS: 10 | 6 days supply | Qty: 21 | Fill #0

## 2020-12-01 NOTE — Progress Notes (Signed)
Office Visit Note   Patient: Kevin [name]           Date of Birth: [birth year]           MRN: [id number] Visit Date: 12/01/2020              Requested by: Claiborne Rigg, NP [address] [place],  [place] [address] PCP: Claiborne Rigg, NP   Assessment & Plan: Visit Diagnoses:  1. Trigger finger, right middle finger   2. Arthritis of finger   3. Primary osteoarthritis of right knee     Plan: [name] is doing well status post trigger finger release.  He is now suffering some inflammatory process going on with the PIP joint of the long finger.  We will obtain arthritis panel to rule out gout and inflammatory arthritis.  I have recommended Voltaren gel and a short course of prednisone.  For the right knee my impression is arthritis exacerbation.  He will also use Voltaren gel and the prednisone should help.  Because he is diabetic cortisone injection today.  He will follow-up if he does not feel any improvement.  Follow-Up Instructions: No follow-ups on file.   Orders:  No orders of the defined types were placed in this encounter.  Meds ordered this encounter  Medications  . predniSONE (STERAPRED UNI-PAK 21 TAB) 10 MG (21) TBPK tablet    Sig: Take as directed    Dispense:  21 tablet    Refill:  0      Procedures: No procedures performed   Clinical Data: No additional findings.   Subjective: Chief Complaint  Patient presents with  . Right Middle Finger - Routine Post Op, Follow-up  . Right Knee - Pain    Kevin [name] returns today status post right long trigger finger release on 10/05/2020.  He is doing well in that regard and it has really helped.  He is also asking to be evaluated for 2 other separate issues.  First 1 is swelling and stiffness in the PIP joint from making a full composite fist.  Denies history of gout or inflammatory arthritis.  Denies any injuries.  His right knee is also bothering him and over the last 4 to 5 months this is getting worse.  Denies any  mechanical symptoms.  He has pain anteriorly and around the patella.  Steps make it worse.  Denies any swelling.   Review of Systems  Constitutional: Negative.   All other systems reviewed and are negative.    Objective: Vital Signs: There were no vitals taken for this visit.  Physical Exam Vitals and nursing note reviewed.  Constitutional:      Appearance: He is well-developed.  Pulmonary:     Effort: Pulmonary effort is normal.  Abdominal:     Palpations: Abdomen is soft.  Skin:    General: Skin is warm.  Neurological:     Mental Status: He is alert and oriented to person, place, and time.  Psychiatric:        Behavior: Behavior normal.        Thought Content: Thought content normal.        Judgment: Judgment normal.     Ortho Exam Right hand shows a fully healed surgical scar.  Scar is nontender.  There is no triggering of the long finger.  His PIP joint is moderately swollen.  Decreased arc of motion centimeter and a half shy of flexor palmar crease.  Mild mild flexion contracture of the PIP  joint.  Right knee shows no joint effusion.  Minimal patellofemoral crepitus.  Collaterals and cruciates are stable.  No jointline tenderness.  Specialty Comments:  No specialty comments available.  Imaging: No results found.   PMFS History: Patient Active Problem List   Diagnosis Date Noted  . Arthritis of finger 12/01/2020  . Primary osteoarthritis of right knee 12/01/2020  . Trigger finger, right middle finger 10/05/2020  . Toe pain, chronic, left 10/07/2018  . Hyperlipidemia associated with type 2 diabetes mellitus (HCC) 09/19/2018  . Hyperlipidemia 03/18/2018  . Dyspnea on exertion 03/18/2018  . Chest pain 03/18/2018  . Diabetes mellitus without complication (HCC) 11/11/2017  . Essential hypertension 11/11/2017   Past Medical History:  Diagnosis Date  . COVID 04/20/2020  . Diabetes mellitus without complication (HCC)   . GERD (gastroesophageal reflux disease)    . Hypertension   . MRSA (methicillin resistant Staphylococcus aureus)     Family History  Problem Relation Age of Onset  . Heart disease Mother   . Hypertension Mother   . Diabetes Sister   . Hypertension Sister   . Stroke Brother   . Diabetes Brother   . Heart disease Brother   . Heart attack Brother   . Hypertension Brother   . Hypertension Father     Past Surgical History:  Procedure Laterality Date  . BACK SURGERY    . LAPAROSCOPIC GASTRIC BANDING    . TRIGGER FINGER RELEASE Right 10/05/2020   Procedure: RELEASE TRIGGER FINGER RIGHT LONG FINGER;  Surgeon: Tarry Kos, MD;  Location: [facility];  Service: Orthopedics;  Laterality: Right;   Social History   Occupational History  . Not on file  Tobacco Use  . Smoking status: Former Smoker    Quit date: 2000    Years since quitting: 22.2  . Smokeless tobacco: Never Used  Vaping Use  . Vaping Use: Never used  Substance and Sexual Activity  . Alcohol use: No  . Drug use: No  . Sexual activity: Yes

## 2020-12-01 NOTE — Addendum Note (Signed)
Addended by: Albertina Parr on: 12/01/2020 10:14 AM   Modules accepted: Orders

## 2020-12-02 ENCOUNTER — Other Ambulatory Visit: Payer: Self-pay

## 2020-12-02 ENCOUNTER — Encounter: Payer: Self-pay | Admitting: Nurse Practitioner

## 2020-12-02 ENCOUNTER — Ambulatory Visit: Payer: Self-pay | Attending: Nurse Practitioner | Admitting: Nurse Practitioner

## 2020-12-02 ENCOUNTER — Other Ambulatory Visit: Payer: Self-pay | Admitting: Nurse Practitioner

## 2020-12-02 DIAGNOSIS — Z1211 Encounter for screening for malignant neoplasm of colon: Secondary | ICD-10-CM

## 2020-12-02 DIAGNOSIS — Z130 Encounter for screening for diseases of the blood and blood-forming organs and certain disorders involving the immune mechanism: Secondary | ICD-10-CM

## 2020-12-02 DIAGNOSIS — E785 Hyperlipidemia, unspecified: Secondary | ICD-10-CM

## 2020-12-02 DIAGNOSIS — I10 Essential (primary) hypertension: Secondary | ICD-10-CM

## 2020-12-02 DIAGNOSIS — E118 Type 2 diabetes mellitus with unspecified complications: Secondary | ICD-10-CM

## 2020-12-02 LAB — URIC ACID: Uric Acid, Serum: 4.3 mg/dL (ref 4.0–8.0)

## 2020-12-02 LAB — ANA: Anti Nuclear Antibody (ANA): NEGATIVE

## 2020-12-02 LAB — RHEUMATOID FACTOR: Rheumatoid fact SerPl-aCnc: <14 IU/mL (ref <14)

## 2020-12-02 LAB — SEDIMENTATION RATE: Sed Rate: 11 mm/h (ref 0–20)

## 2020-12-02 MED ORDER — ENALAPRIL MALEATE 20 MG PO TABS: 40.0000 mg | ORAL_TABLET | Freq: Every day | ORAL | 1 refills | Status: DC

## 2020-12-02 MED ORDER — GLIMEPIRIDE 4 MG PO TABS: 4.0000 mg | ORAL_TABLET | Freq: Every day | ORAL | 1 refills | Status: DC

## 2020-12-02 MED FILL — GLIMEPIRIDE 4 MG TABS: 4 | 90 days supply | Qty: 90 | Fill #0

## 2020-12-02 MED FILL — ENALAPRIL MALEATE 20 MG TAB: 20 | 90 days supply | Qty: 180 | Fill #0

## 2020-12-02 NOTE — Progress Notes (Signed)
Virtual Visit via Telephone Note Due to national recommendations of social distancing due to [place] 19, telehealth visit is felt to be most appropriate for this patient at this time.  I discussed the limitations, risks, security and privacy concerns of performing an evaluation and management service by telephone and the availability of in person appointments. I also discussed with the patient that there may be a patient responsible charge related to this service. The patient expressed understanding and agreed to proceed.    I connected with Kevin [name] on 12/02/20  at   8:30 AM EDT  EDT by telephone and verified that I am speaking with the correct person using two identifiers.   Consent I discussed the limitations, risks, security and privacy concerns of performing an evaluation and management service by telephone and the availability of in person appointments. I also discussed with the patient that there may be a patient responsible charge related to this service. The patient expressed understanding and agreed to proceed.   Location of Patient: Private Residence   Location of Provider: [place] and [employer] Office    Persons participating in Telemedicine visit: Kevin Rankins FNP-BC [place]    History of Present Illness: Telemedicine visit for: Follow Up He has a past medical history of COVID (04/20/2020), DM2, GERD, Hypertension, and MRSA  He is seeing Dr. Erlinda Hong for right knee OA and right middle trigger finger. Currently taking prednisone PO. Symptoms mildly improved. Also working with OT/Rehab at this time s/p trigger finger release surgery with post op PIP inflammatory process.   DM2 Well controlled at this time with glimepiride 4 mg daily and janumet 50-1000 mg BID. LDL nearing goal with atorvastatin 40 mg daily. He is also taking enalapril 40 mg daily and amlodipine 5 mg daily. Denies chest pain, shortness of breath, palpitations, lightheadedness,  dizziness, headaches or BLE edema.  Lab Results  Component Value Date   HGBA1C 5.8 (A) 05/27/2020   Lab Results  Component Value Date   LDLCALC 80 11/24/2019   BP Readings from Last 3 Encounters:  10/05/20 (!) 151/89  09/21/20 117/75  05/27/20 (!) 149/94   States his wife recently re contracted COVID however he did not. He has been vaccinated and received the booster and was diagnosed with COVID last summer.   Past Medical History:  Diagnosis Date  . COVID 04/20/2020  . Diabetes mellitus without complication ([place])   . GERD (gastroesophageal reflux disease)   . Hypertension   . MRSA (methicillin resistant Staphylococcus aureus)     Past Surgical History:  Procedure Laterality Date  . BACK SURGERY    . LAPAROSCOPIC GASTRIC BANDING    . TRIGGER FINGER RELEASE Right 10/05/2020   Procedure: RELEASE TRIGGER FINGER RIGHT LONG FINGER;  Surgeon: Leandrew Koyanagi, MD;  Location: [facility];  Service: Orthopedics;  Laterality: Right;    Family History  Problem Relation Age of Onset  . Heart disease Mother   . Hypertension Mother   . Diabetes Sister   . Hypertension Sister   . Stroke Brother   . Diabetes Brother   . Heart disease Brother   . Heart attack Brother   . Hypertension Brother   . Hypertension Father     Social History   Socioeconomic History  . Marital status: Divorced    Spouse name: Not on file  . Number of children: Not on file  . Years of education: Not on file  . Highest education level: Not on  file  Occupational History  . Not on file  Tobacco Use  . Smoking status: Former Smoker    Quit date: 2000    Years since quitting: 22.2  . Smokeless tobacco: Never Used  Vaping Use  . Vaping Use: Never used  Substance and Sexual Activity  . Alcohol use: No  . Drug use: No  . Sexual activity: Yes  Other Topics Concern  . Not on file  Social History Narrative  . Not on file   Social Determinants of Health   Financial Resource Strain: Not on  file  Food Insecurity: Not on file  Transportation Needs: Not on file  Physical Activity: Not on file  Stress: Not on file  Social Connections: Not on file     Observations/Objective: Awake, alert and oriented x 3   Review of Systems  Constitutional: Negative for fever, malaise/fatigue and weight loss.  HENT: Negative.  Negative for nosebleeds.   Eyes: Negative.  Negative for blurred vision, double vision and photophobia.  Respiratory: Negative.  Negative for cough and shortness of breath.   Cardiovascular: Negative.  Negative for chest pain, palpitations and leg swelling.  Gastrointestinal: Negative.  Negative for heartburn, nausea and vomiting.  Musculoskeletal: Positive for joint pain. Negative for myalgias.  Neurological: Negative.  Negative for dizziness, focal weakness, seizures and headaches.  Psychiatric/Behavioral: Negative.  Negative for suicidal ideas.    Assessment and Plan: Kevin [name] was seen today for medication refill.  Diagnoses and all orders for this visit:  Colon cancer screening -     Fecal occult blood, imunochemical([facility]); Future  Type 2 diabetes mellitus with complication, without long-term current use of insulin (HCC) -     glimepiride (AMARYL) 4 MG tablet; Take 1 tablet (4 mg total) by mouth daily before breakfast. Please fill as a 90 day supply -     Hemoglobin A1c; Future Continue blood sugar control as discussed in office today, low carbohydrate diet, and regular physical exercise as tolerated, 150 minutes per week (30 min each day, 5 days per week, or 50 min 3 days per week). Keep blood sugar logs with fasting goal of 90-130 mg/dl, post prandial (after you eat) less than 180.  For Hypoglycemia: BS <60 and Hyperglycemia BS >400; contact the clinic ASAP. Annual eye exams and foot exams are recommended.   Essential hypertension -     enalapril (VASOTEC) 20 MG tablet; Take 2 tablets (40 mg total) by mouth daily. Please fill as 90 day supply -      CMP14+EGFR; Future Continue all antihypertensives as prescribed.  Remember to bring in your blood pressure log with you for your follow up appointment.  DASH/Mediterranean Diets are healthier choices for HTN.    Dyslipidemia, goal LDL below 70 -     Lipid panel; Future INSTRUCTIONS: Work on a low fat, heart healthy diet and participate in regular aerobic exercise program by working out at least 150 minutes per week; 5 days a week-30 minutes per day. Avoid red meat/beef/steak,  fried foods. junk foods, sodas, sugary drinks, unhealthy snacking, alcohol and smoking.  Drink at least 80 oz of water per day and monitor your carbohydrate intake daily.    Screening for deficiency anemia -     CBC; Future     Follow Up Instructions Return in about 3 months (around 03/04/2021).     I discussed the assessment and treatment plan with the patient. The patient was provided an opportunity to ask questions and all were answered. The patient  agreed with the plan and demonstrated an understanding of the instructions.   The patient was advised to call back or seek an in-person evaluation if the symptoms worsen or if the condition fails to improve as anticipated.  I provided 13 minutes of non-face-to-face time during this encounter including median intraservice time, reviewing previous notes, labs, imaging, medications and explaining diagnosis and management.  [name], FNP-BC

## 2020-12-05 ENCOUNTER — Other Ambulatory Visit: Payer: Self-pay

## 2020-12-05 ENCOUNTER — Ambulatory Visit: Payer: Self-pay | Admitting: Occupational Therapy

## 2020-12-05 MED FILL — ATORVASTATIN CALCIUM 40 MG: 40 | 90 days supply | Qty: 90 | Fill #3

## 2020-12-07 ENCOUNTER — Ambulatory Visit: Payer: Self-pay | Admitting: Occupational Therapy

## 2020-12-10 ENCOUNTER — Other Ambulatory Visit: Payer: Self-pay

## 2020-12-10 MED FILL — Sitagliptin-Metformin HCl Tab 50-1000 MG: ORAL | 30 days supply | Qty: 60 | Fill #0 | Status: AC

## 2020-12-11 ENCOUNTER — Other Ambulatory Visit: Payer: Self-pay

## 2020-12-12 ENCOUNTER — Ambulatory Visit: Payer: Self-pay | Admitting: Occupational Therapy

## 2020-12-14 ENCOUNTER — Ambulatory Visit: Payer: Self-pay | Admitting: Occupational Therapy

## 2020-12-14 ENCOUNTER — Other Ambulatory Visit: Payer: Self-pay

## 2020-12-14 MED FILL — Omeprazole Cap Delayed Release 20 MG: ORAL | 30 days supply | Qty: 60 | Fill #0 | Status: AC

## 2020-12-19 ENCOUNTER — Ambulatory Visit: Payer: Self-pay | Admitting: Occupational Therapy

## 2020-12-21 ENCOUNTER — Ambulatory Visit: Payer: Self-pay | Admitting: Occupational Therapy

## 2020-12-26 ENCOUNTER — Ambulatory Visit: Payer: Self-pay | Admitting: Occupational Therapy

## 2020-12-27 ENCOUNTER — Other Ambulatory Visit: Payer: Self-pay

## 2020-12-27 ENCOUNTER — Ambulatory Visit: Payer: Self-pay | Attending: Nurse Practitioner

## 2020-12-27 DIAGNOSIS — Z130 Encounter for screening for diseases of the blood and blood-forming organs and certain disorders involving the immune mechanism: Secondary | ICD-10-CM

## 2020-12-27 DIAGNOSIS — I10 Essential (primary) hypertension: Secondary | ICD-10-CM

## 2020-12-27 DIAGNOSIS — E118 Type 2 diabetes mellitus with unspecified complications: Secondary | ICD-10-CM

## 2020-12-27 DIAGNOSIS — E785 Hyperlipidemia, unspecified: Secondary | ICD-10-CM

## 2020-12-28 ENCOUNTER — Ambulatory Visit: Payer: Self-pay | Admitting: Occupational Therapy

## 2020-12-28 LAB — CMP14+EGFR
ALT: 21 IU/L (ref 0–44)
AST: 20 IU/L (ref 0–40)
Albumin/Globulin Ratio: 1.6 (ref 1.2–2.2)
Albumin: 4.5 g/dL (ref 3.8–4.9)
Alkaline Phosphatase: 65 IU/L (ref 44–121)
BUN/Creatinine Ratio: 11 (ref 9–20)
BUN: 12 mg/dL (ref 6–24)
Bilirubin Total: 0.4 mg/dL (ref 0.0–1.2)
CO2: 21 mmol/L (ref 20–29)
Calcium: 9.7 mg/dL (ref 8.7–10.2)
Chloride: 101 mmol/L (ref 96–106)
Creatinine, Ser: 1.06 mg/dL (ref 0.76–1.27)
Globulin, Total: 2.9 g/dL (ref 1.5–4.5)
Glucose: 162 mg/dL — ABNORMAL HIGH (ref 65–99)
Potassium: 5.4 mmol/L — ABNORMAL HIGH (ref 3.5–5.2)
Sodium: 139 mmol/L (ref 134–144)
Total Protein: 7.4 g/dL (ref 6.0–8.5)
eGFR: 83 mL/min/{1.73_m2} (ref >59)

## 2020-12-28 LAB — LIPID PANEL
Chol/HDL Ratio: 3.7 ratio (ref 0.0–5.0)
Cholesterol, Total: 147 mg/dL (ref 100–199)
HDL: 40 mg/dL (ref >39)
LDL Chol Calc (NIH): 88 mg/dL (ref 0–99)
Triglycerides: 101 mg/dL (ref 0–149)
VLDL Cholesterol Cal: 19 mg/dL (ref 5–40)

## 2020-12-28 LAB — HEMOGLOBIN A1C
Est. average glucose Bld gHb Est-mCnc: 151 mg/dL
Hgb A1c MFr Bld: 6.9 % — ABNORMAL HIGH (ref 4.8–5.6)

## 2020-12-28 LAB — CBC
Hematocrit: 38.5 % (ref 37.5–51.0)
Hemoglobin: 12.8 g/dL — ABNORMAL LOW (ref 13.0–17.7)
MCH: 28.6 pg (ref 26.6–33.0)
MCHC: 33.2 g/dL (ref 31.5–35.7)
MCV: 86 fL (ref 79–97)
Platelets: 283 10*3/uL (ref 150–450)
RBC: 4.48 x10E6/uL (ref 4.14–5.80)
RDW: 13.5 % (ref 11.6–15.4)
WBC: 5.4 10*3/uL (ref 3.4–10.8)

## 2020-12-30 ENCOUNTER — Encounter: Payer: Self-pay | Admitting: Nurse Practitioner

## 2021-01-09 MED FILL — Sitagliptin-Metformin HCl Tab 50-1000 MG: ORAL | 30 days supply | Qty: 60 | Fill #1 | Status: AC

## 2021-01-10 ENCOUNTER — Other Ambulatory Visit: Payer: Self-pay

## 2021-01-12 ENCOUNTER — Other Ambulatory Visit: Payer: Self-pay

## 2021-02-07 ENCOUNTER — Encounter: Payer: Self-pay | Admitting: Occupational Therapy

## 2021-02-07 NOTE — Therapy (Signed)
[facility] [address] [place], [place], [address] Phone: [phone]   Fax:  [phone]  Patient Details  Name: Kevin [name] MRN: [id number] Date of Birth: 1966/03/09 Referring Provider:  No ref. provider found  Encounter Date: 02/07/2021   OCCUPATIONAL THERAPY DISCHARGE SUMMARY  Visits from Start of Care: 6  Current functional level related to goals / functional outcomes:  OT Short Term Goals - 11/14/20 0905      OT SHORT TERM GOAL #1   Title Pt will be independent with ROM HEP.--check STGs 12/14/20    Time 4    Period Weeks    Status Met     OT SHORT TERM GOAL #2   Title Pt will demo at least 90* R 3rd digit PIP flex for improved grasp of objects.    Baseline 80*    Time 4    Period Weeks    Status Not met 85*/-10* ext     OT SHORT TERM GOAL #3   Title Pt will demo at least 80* R 3rd digit MP flex for improved grasp of objects.    Baseline 65*    Time 4    Period Weeks    Status Met 80*     OT SHORT TERM GOAL #4   Title Pt will demo at least 60* R 3rd digit DIP flex for improved grasp of objects.    Baseline 45*    Time 4    Period Weeks    Status Not met 40*     OT SHORT TERM GOAL #5   Title Pt will be independent with scar massage and splint wear/care (if needed).    Baseline --    Time 4    Period Weeks    Status Partially met.  Pt instructed in use of buddy strap and scar massage              OT Long Term Goals - 11/14/20 0909      OT LONG TERM GOAL #1   Title Pt will be independent with strengthening HEP.--check LTGs 01/13/21    Time 8    Period Weeks    Status Not met/unable to reassess     OT LONG TERM GOAL #2   Title Pt will demo at least 60lbs R grip strength for lifting/picking up objects.    Baseline 48lbs    Time 8    Period Weeks    Status Not met/unable to reassess      OT LONG TERM GOAL #3   Title Pt will report pain less than or equal to 2/10 with R hand functional  use for light ADLs/IADLs.    Time 8    Period Weeks    Status Not met/unable to reassess     OT LONG TERM GOAL #4   Title Pt will improve R coordination for ADLs/picking up pills/small objects as shown by completing 9-hole peg test in 21sec or less.    Baseline 26.28sec    Time 8    Period Weeks    Status Not met/unable to reassess      OT LONG TERM GOAL #5   Title Pt will improve R 3rd digit PIP ext to at least -15* for incr ease in putting hand in pockets/gloves and grasp.    Baseline -30*    Time 8    Period Weeks    Status Not met/unable to reassess     OT LONG TERM GOAL #6  Title Pt will demo at least 100* R 3rd digit PIP flex for improved grasp of objects.    Baseline 80*    Time 8    Period Weeks    Status Not met/unable to reassess      OT LONG TERM GOAL #7   Title Pt will demo at least 90* R 3rd digit MP flex for improved grasp of objects.    Baseline 65*    Time 8    Period Weeks    Status Not met/unable to reassess             Remaining deficits: decr ROM, decr strength, decr R hand functional use, pain.  LTGs not assessed/addressed due to pt not returning to OT.   Education / Equipment: Pt instructed in initial ROM HEP, buddy strap use, and scar massage.  Pt verbalized understanding of education provided.  Plan: Patient agrees to discharge.  Patient goals were partially met. Patient is being discharged due to not returning since the last visit.  Pt cancelled remaining visits to hold OT while receiving new treatment, but did not return within 30 days and will be discharged at this time.       [facility] 02/07/2021, 9:12 AM  [place] [address] [place], [place], [address] Phone: [phone]   Fax:  [place], OTR/L [facility] [address] [place], [place]  [address] [phone] phone [phone] 02/07/21 9:12  AM

## 2021-03-07 ENCOUNTER — Other Ambulatory Visit: Payer: Self-pay

## 2021-03-07 ENCOUNTER — Ambulatory Visit: Payer: Self-pay | Attending: Nurse Practitioner | Admitting: Nurse Practitioner

## 2021-03-07 ENCOUNTER — Ambulatory Visit: Payer: Self-pay

## 2021-03-07 ENCOUNTER — Encounter: Payer: Self-pay | Admitting: Nurse Practitioner

## 2021-03-07 DIAGNOSIS — E118 Type 2 diabetes mellitus with unspecified complications: Secondary | ICD-10-CM

## 2021-03-07 DIAGNOSIS — Z8719 Personal history of other diseases of the digestive system: Secondary | ICD-10-CM

## 2021-03-07 DIAGNOSIS — E785 Hyperlipidemia, unspecified: Secondary | ICD-10-CM

## 2021-03-07 DIAGNOSIS — G8929 Other chronic pain: Secondary | ICD-10-CM

## 2021-03-07 DIAGNOSIS — Z1211 Encounter for screening for malignant neoplasm of colon: Secondary | ICD-10-CM

## 2021-03-07 DIAGNOSIS — M25561 Pain in right knee: Secondary | ICD-10-CM

## 2021-03-07 DIAGNOSIS — D649 Anemia, unspecified: Secondary | ICD-10-CM

## 2021-03-07 DIAGNOSIS — I10 Essential (primary) hypertension: Secondary | ICD-10-CM

## 2021-03-07 DIAGNOSIS — E1169 Type 2 diabetes mellitus with other specified complication: Secondary | ICD-10-CM

## 2021-03-07 MED ORDER — GLIMEPIRIDE 4 MG PO TABS
4.0000 mg | ORAL_TABLET | Freq: Every day | ORAL | 1 refills | Status: DC
  Filled 2021-03-07: qty 30, 30d supply, fill #0
  Filled 2021-04-08: qty 30, 30d supply, fill #1
  Filled 2021-05-06: qty 30, 30d supply, fill #2
  Filled 2021-06-04: qty 30, 30d supply, fill #3
  Filled 2021-07-06: qty 30, 30d supply, fill #4
  Filled 2021-08-05: qty 30, 30d supply, fill #5

## 2021-03-07 MED ORDER — ATORVASTATIN CALCIUM 40 MG PO TABS
40.0000 mg | ORAL_TABLET | Freq: Every day | ORAL | 3 refills | Status: DC
  Filled 2021-03-07: qty 30, 30d supply, fill #0
  Filled 2021-04-08: qty 30, 30d supply, fill #1
  Filled 2021-05-06: qty 30, 30d supply, fill #2
  Filled 2021-06-04: qty 30, 30d supply, fill #3
  Filled 2021-07-06: qty 30, 30d supply, fill #4
  Filled 2021-08-05: qty 30, 30d supply, fill #5
  Filled 2021-09-02: qty 30, 30d supply, fill #6
  Filled 2021-10-02: qty 30, 30d supply, fill #0
  Filled 2021-10-02: qty 30, 30d supply, fill #7
  Filled 2021-10-29: qty 30, 30d supply, fill #1
  Filled 2021-11-26: qty 30, 30d supply, fill #2
  Filled 2021-12-25: qty 60, 60d supply, fill #3

## 2021-03-07 MED ORDER — AMLODIPINE BESYLATE 5 MG PO TABS
ORAL_TABLET | ORAL | 1 refills | Status: DC
  Filled 2021-03-07: qty 45, 30d supply, fill #0
  Filled 2021-04-08: qty 45, 30d supply, fill #1
  Filled 2021-05-06: qty 45, 30d supply, fill #2
  Filled 2021-06-04: qty 45, 30d supply, fill #3

## 2021-03-07 MED ORDER — SITAGLIPTIN PHOS-METFORMIN HCL 50-1000 MG PO TABS
1.0000 | ORAL_TABLET | Freq: Two times a day (BID) | ORAL | 1 refills | Status: DC
  Filled 2021-03-07: qty 60, 30d supply, fill #0
  Filled 2021-04-08: qty 60, 30d supply, fill #1
  Filled 2021-05-06: qty 60, 30d supply, fill #2
  Filled 2021-06-04: qty 60, 30d supply, fill #3
  Filled 2021-07-06: qty 60, 30d supply, fill #4
  Filled 2021-08-05: qty 60, 30d supply, fill #5

## 2021-03-07 MED ORDER — ENALAPRIL MALEATE 20 MG PO TABS
40.0000 mg | ORAL_TABLET | Freq: Every day | ORAL | 1 refills | Status: DC
  Filled 2021-03-07: qty 60, 30d supply, fill #0
  Filled 2021-04-08: qty 60, 30d supply, fill #1
  Filled 2021-05-06: qty 60, 30d supply, fill #2
  Filled 2021-06-04: qty 60, 30d supply, fill #3
  Filled 2021-07-06: qty 60, 30d supply, fill #4
  Filled 2021-08-05: qty 60, 30d supply, fill #5

## 2021-03-07 MED ORDER — ACETAMINOPHEN-CODEINE #3 300-30 MG PO TABS
ORAL_TABLET | ORAL | 0 refills | Status: DC
  Filled 2021-03-07: qty 30, 5d supply, fill #0

## 2021-03-07 MED ORDER — TRUE METRIX BLOOD GLUCOSE TEST VI STRP
ORAL_STRIP | 12 refills | Status: DC
  Filled 2021-03-07: qty 100, 50d supply, fill #0

## 2021-03-07 MED ORDER — TRUEPLUS LANCETS 28G MISC
6 refills | Status: DC
  Filled 2021-03-07: qty 100, 50d supply, fill #0

## 2021-03-07 MED ORDER — OMEPRAZOLE 20 MG PO CPDR
DELAYED_RELEASE_CAPSULE | Freq: Two times a day (BID) | ORAL | 3 refills | Status: DC
  Filled 2021-03-07: qty 60, 30d supply, fill #0
  Filled 2021-04-08: qty 60, 30d supply, fill #1
  Filled 2021-05-06: qty 60, 30d supply, fill #2
  Filled 2021-06-04: qty 60, 30d supply, fill #3
  Filled 2021-07-06: qty 60, 30d supply, fill #4
  Filled 2021-08-05: qty 60, 30d supply, fill #5
  Filled 2021-09-02: qty 60, 30d supply, fill #6
  Filled 2021-10-02: qty 60, 30d supply, fill #0
  Filled 2021-10-02: qty 60, 30d supply, fill #7
  Filled 2021-10-29: qty 60, 30d supply, fill #1
  Filled 2021-11-26: qty 60, 30d supply, fill #2

## 2021-03-07 NOTE — Progress Notes (Signed)
Virtual Visit via Telephone Note Due to national recommendations of social distancing due to [place] 19, telehealth visit is felt to be most appropriate for this patient at this time.  I discussed the limitations, risks, security and privacy concerns of performing an evaluation and management service by telephone and the availability of in person appointments. I also discussed with the patient that there may be a patient responsible charge related to this service. The patient expressed understanding and agreed to proceed.    I connected with Kevin [name] on 03/07/21  at   8:30 AM EDT  EDT by telephone and verified that I am speaking with the correct person using two identifiers.  Location of Patient: Private Residence   Location of Provider: [place] [facility] [place] participating in Telemedicine visit: [name] FNP-BC [place] [place]    History of Present Illness: Telemedicine visit for: Follow up   States right middle finger has swelling and limited mobility. He has chronic right knee pain. Requesting a medication to help with pain. He has taken tylenol #3 in the past and states this has provided relief. He needs to renew his financial assistance before he can see the orthopedist again.  He has a history of gastritis so NSAIDs would not be recommended.   Essential Hypertension Notes a 10lb weight loss since last office visit in January.  Blood pressure was not well controlled at his last office visit.  He is currently taking enalapril 40 mg daily and amlodipine 5 mg daily. Denies chest pain, shortness of breath, palpitations, lightheadedness, dizziness, headaches or BLE edema.   BP Readings from Last 3 Encounters:  10/05/20 (!) 151/89  09/21/20 117/75  05/27/20 (!) 149/94     Diabetes is well controlled with glimepiride 4 mg daily and Janumet 50-1000 mg twice daily.  He denies any symptoms of hypo or hyperglycemia.  LDL not quite at goal however he does  endorse adherence taking atorvastatin 40 mg daily. Lab Results  Component Value Date   HGBA1C 6.9 (H) 12/27/2020    Lab Results  Component Value Date   [place] 88 12/27/2020    Past Medical History:  Diagnosis Date   COVID 04/20/2020   Diabetes mellitus without complication (HCC)    GERD (gastroesophageal reflux disease)    Hypertension    MRSA (methicillin resistant Staphylococcus aureus)     Past Surgical History:  Procedure Laterality Date   BACK SURGERY     LAPAROSCOPIC GASTRIC BANDING     TRIGGER FINGER RELEASE Right 10/05/2020   Procedure: RELEASE TRIGGER FINGER RIGHT LONG FINGER;  Surgeon: Leandrew Koyanagi, MD;  Location: [facility];  Service: Orthopedics;  Laterality: Right;    Family History  Problem Relation Age of Onset   Heart disease Mother    Hypertension Mother    Diabetes Sister    Hypertension Sister    Stroke Brother    Diabetes Brother    Heart disease Brother    Heart attack Brother    Hypertension Brother    Hypertension Father     Social History   Socioeconomic History   Marital status: Divorced    Spouse name: Not on file   Number of children: Not on file   Years of education: Not on file   Highest education level: Not on file  Occupational History   Not on file  Tobacco Use   Smoking status: Former    Pack years: 0.00    Types: Cigarettes  Quit date: 2000    Years since quitting: 22.5   Smokeless tobacco: Never  Vaping Use   Vaping Use: Never used  Substance and Sexual Activity   Alcohol use: No   Drug use: No   Sexual activity: Yes  Other Topics Concern   Not on file  Social History Narrative   Not on file   Social Determinants of Health   Financial Resource Strain: Not on file  Food Insecurity: Not on file  Transportation Needs: Not on file  Physical Activity: Not on file  Stress: Not on file  Social Connections: Not on file     Observations/Objective: Awake, alert and oriented x 3   Review of  Systems  Constitutional:  Negative for fever, malaise/fatigue and weight loss.  HENT: Negative.  Negative for nosebleeds.   Eyes: Negative.  Negative for blurred vision, double vision and photophobia.  Respiratory: Negative.  Negative for cough and shortness of breath.   Cardiovascular: Negative.  Negative for chest pain, palpitations and leg swelling.  Gastrointestinal: Negative.  Negative for heartburn, nausea and vomiting.  Musculoskeletal:  Positive for joint pain. Negative for myalgias.  Neurological: Negative.  Negative for dizziness, focal weakness, seizures and headaches.  Psychiatric/Behavioral: Negative.  Negative for suicidal ideas.    Assessment and Plan: Diagnoses and all orders for this visit:  Chronic pain of right knee -     acetaminophen-codeine (TYLENOL #3) 300-30 MG tablet; TAKE 1-2 TABLETS BY MOUTH EVERY 8 (EIGHT) HOURS AS NEEDED FOR MODERATE PAIN.  Type 2 diabetes mellitus with complication, without long-term current use of insulin (HCC) -     CMP14+EGFR -     sitaGLIPtin-metformin (JANUMET) 50-1000 MG tablet; Take 1 tablet by mouth 2 (two) times daily with a meal. -     TRUEplus Lancets 28G MISC; Use as instructed. Check blood glucose level by fingerstick twice per day. -     glucose blood (TRUE METRIX BLOOD GLUCOSE TEST) test strip; Use as instructed. Check blood glucose level by fingerstick twice per day. -     glimepiride (AMARYL) 4 MG tablet; Take 1 tablet (4 mg total) by mouth daily with breakfast. Please fill as a 90 day supply Continue blood sugar control as discussed in office today, low carbohydrate diet, and regular physical exercise as tolerated, 150 minutes per week (30 min each day, 5 days per week, or 50 min 3 days per week). Keep blood sugar logs with fasting goal of 90-130 mg/dl, post prandial (after you eat) less than 180.  For Hypoglycemia: BS <60 and Hyperglycemia BS >400; contact the clinic ASAP. Annual eye exams and foot exams are recommended.    Hyperlipidemia associated with type 2 diabetes mellitus (HCC) -     atorvastatin (LIPITOR) 40 MG tablet; Take 1 tablet (40 mg total) by mouth daily. INSTRUCTIONS: Work on a low fat, heart healthy diet and participate in regular aerobic exercise program by working out at least 150 minutes per week; 5 days a week-30 minutes per day. Avoid red meat/beef/steak,  fried foods. junk foods, sodas, sugary drinks, unhealthy snacking, alcohol and smoking.  Drink at least 80 oz of water per day and monitor your carbohydrate intake daily.    Essential hypertension -     enalapril (VASOTEC) 20 MG tablet; Take 2 tablets (40 mg total) by mouth daily. -     amLODipine (NORVASC) 5 MG tablet; TAKE 1 TABLET (5 MG TOTAL) BY MOUTH DAILY. MAY TAKE AN EXTRA 1/2 TABLET (2.5 MG) FOR SYSTOLIC  GREATER THAN 180 Continue all antihypertensives as prescribed.  Remember to bring in your blood pressure log with you for your follow up appointment.  DASH/Mediterranean Diets are healthier choices for HTN.    History of gastritis -     omeprazole (PRILOSEC) 20 MG capsule; TAKE 1 CAPSULE (20 MG TOTAL) BY MOUTH 2 (TWO) TIMES DAILY BEFORE A MEAL. INSTRUCTIONS: Avoid GERD Triggers: acidic, spicy or fried foods, caffeine, coffee, sodas,  alcohol and chocolate.    Low hemoglobin -     CBC  Colon cancer screening -     Fecal occult blood, imunochemical([facility])    Follow Up Instructions Return in about 3 months (around 06/07/2021).     I discussed the assessment and treatment plan with the patient. The patient was provided an opportunity to ask questions and all were answered. The patient agreed with the plan and demonstrated an understanding of the instructions.   The patient was advised to call back or seek an in-person evaluation if the symptoms worsen or if the condition fails to improve as anticipated.  I provided 19 minutes of non-face-to-face time during this encounter including median intraservice time,  reviewing previous notes, labs, imaging, medications and explaining diagnosis and management.  [name], FNP-BC

## 2021-03-08 LAB — CMP14+EGFR
ALT: 38 IU/L (ref 0–44)
AST: 35 IU/L (ref 0–40)
Albumin/Globulin Ratio: 2.4 — ABNORMAL HIGH (ref 1.2–2.2)
Albumin: 5.1 g/dL — ABNORMAL HIGH (ref 3.8–4.9)
Alkaline Phosphatase: 65 IU/L (ref 44–121)
BUN/Creatinine Ratio: 8 — ABNORMAL LOW (ref 9–20)
BUN: 8 mg/dL (ref 6–24)
Bilirubin Total: 0.5 mg/dL (ref 0.0–1.2)
CO2: 20 mmol/L (ref 20–29)
Calcium: 9.7 mg/dL (ref 8.7–10.2)
Chloride: 102 mmol/L (ref 96–106)
Creatinine, Ser: 1 mg/dL (ref 0.76–1.27)
Globulin, Total: 2.1 g/dL (ref 1.5–4.5)
Glucose: 85 mg/dL (ref 65–99)
Potassium: 4.2 mmol/L (ref 3.5–5.2)
Sodium: 138 mmol/L (ref 134–144)
Total Protein: 7.2 g/dL (ref 6.0–8.5)
eGFR: 89 mL/min/{1.73_m2} (ref >59)

## 2021-03-08 LAB — CBC
Hematocrit: 38.3 % (ref 37.5–51.0)
Hemoglobin: 12.8 g/dL — ABNORMAL LOW (ref 13.0–17.7)
MCH: 28.8 pg (ref 26.6–33.0)
MCHC: 33.4 g/dL (ref 31.5–35.7)
MCV: 86 fL (ref 79–97)
Platelets: 305 10*3/uL (ref 150–450)
RBC: 4.44 x10E6/uL (ref 4.14–5.80)
RDW: 13.9 % (ref 11.6–15.4)
WBC: 4.1 10*3/uL (ref 3.4–10.8)

## 2021-04-10 ENCOUNTER — Other Ambulatory Visit: Payer: Self-pay

## 2021-05-08 ENCOUNTER — Other Ambulatory Visit: Payer: Self-pay

## 2021-05-10 LAB — FECAL OCCULT BLOOD, IMMUNOCHEMICAL: Fecal Occult Bld: NEGATIVE

## 2021-06-05 ENCOUNTER — Other Ambulatory Visit: Payer: Self-pay

## 2021-06-07 ENCOUNTER — Ambulatory Visit: Payer: Self-pay | Attending: Nurse Practitioner | Admitting: Nurse Practitioner

## 2021-06-07 ENCOUNTER — Other Ambulatory Visit: Payer: Self-pay

## 2021-06-07 ENCOUNTER — Encounter: Payer: Self-pay | Admitting: Nurse Practitioner

## 2021-06-07 VITALS — BP 149/92 | HR 65 | Ht 67.0 in | Wt 184.0 lb

## 2021-06-07 DIAGNOSIS — Z114 Encounter for screening for human immunodeficiency virus [HIV]: Secondary | ICD-10-CM

## 2021-06-07 DIAGNOSIS — D649 Anemia, unspecified: Secondary | ICD-10-CM

## 2021-06-07 DIAGNOSIS — I10 Essential (primary) hypertension: Secondary | ICD-10-CM

## 2021-06-07 DIAGNOSIS — E118 Type 2 diabetes mellitus with unspecified complications: Secondary | ICD-10-CM

## 2021-06-07 DIAGNOSIS — Z23 Encounter for immunization: Secondary | ICD-10-CM

## 2021-06-07 MED ORDER — AMLODIPINE BESYLATE 10 MG PO TABS
10.0000 mg | ORAL_TABLET | Freq: Every day | ORAL | 1 refills | Status: DC
  Filled 2021-06-07: qty 30, 30d supply, fill #0
  Filled 2021-07-06: qty 30, 30d supply, fill #1
  Filled 2021-08-05: qty 30, 30d supply, fill #2
  Filled 2021-09-02: qty 30, 30d supply, fill #3
  Filled 2021-10-02: qty 30, 30d supply, fill #0
  Filled 2021-10-02: qty 30, 30d supply, fill #4
  Filled 2021-10-29: qty 30, 30d supply, fill #1

## 2021-06-07 NOTE — Progress Notes (Signed)
Assessment & Plan:  Kevin [name] was seen today for hypertension.  Diagnoses and all orders for this visit:  Essential hypertension -     CMP14+EGFR -     amLODipine (NORVASC) 10 MG tablet; Take 1 tablet (10 mg total) by mouth daily. Continue all antihypertensives as prescribed.  Remember to bring in your blood pressure log with you for your follow up appointment.  DASH/Mediterranean Diets are healthier choices for HTN.    Type 2 diabetes mellitus with complication, without long-term current use of insulin (HCC) WELL CONTROLLED -     Hemoglobin A1c -     Ambulatory referral to Ophthalmology Continue blood sugar control as discussed in office today, low carbohydrate diet, and regular physical exercise as tolerated, 150 minutes per week (30 min each day, 5 days per week, or 50 min 3 days per week). Keep blood sugar logs with fasting goal of 90-130 mg/dl, post prandial (after you eat) less than 180.  For Hypoglycemia: BS <60 and Hyperglycemia BS >400; contact the clinic ASAP. Annual eye exams and foot exams are recommended.   Anemia, unspecified type -     CBC -     Iron, TIBC and Ferritin Panel -     Thyroid Panel With TSH  Encounter for screening for HIV -     HIV antibody (with reflex)  Need for immunization against influenza -     Flu Vaccine QUAD 42moIM (Fluarix, Fluzone & Alfiuria Quad PF)   Patient has been counseled on age-appropriate routine health concerns for screening and prevention. These are reviewed and up-to-date. Referrals have been placed accordingly. Immunizations are up-to-date or declined.    Subjective:   Chief Complaint  Patient presents with   Hypertension   Hypertension Pertinent negatives include no blurred vision, chest pain, headaches, malaise/fatigue, palpitations or shortness of breath.  Kevin [name] 55y.o. male presents to office today for follow up to HTN. He has a past medical history of COVID (04/20/2020), Diabetes mellitus without complication  ([place], GERD (gastroesophageal reflux disease), Hypertension, and MRSA (methicillin resistant Staphylococcus aureus).   HTN Blood pressure is elevated. He endorses diastolic readings in the [address] home. Taking amlodipine 5 mg daily and enalapril 20 mg daily as prescribed. Will increase amlodipine to 16mtoday. Denies chest pain, shortness of breath, palpitations, lightheadedness, dizziness, headaches or BLE edema.   BP Readings from Last 3 Encounters:  06/07/21 (!) 149/92  10/05/20 (!) 151/89  09/21/20 117/75     Review of Systems  Constitutional:  Negative for fever, malaise/fatigue and weight loss.  HENT: Negative.  Negative for nosebleeds.   Eyes: Negative.  Negative for blurred vision, double vision and photophobia.  Respiratory: Negative.  Negative for cough and shortness of breath.   Cardiovascular: Negative.  Negative for chest pain, palpitations and leg swelling.  Gastrointestinal: Negative.  Negative for heartburn, nausea and vomiting.  Musculoskeletal:  Positive for joint pain (right knee pain.). Negative for myalgias.  Neurological: Negative.  Negative for dizziness, focal weakness, seizures and headaches.  Psychiatric/Behavioral: Negative.  Negative for suicidal ideas.    Past Medical History:  Diagnosis Date   COVID 04/20/2020   Diabetes mellitus without complication (HCC)    GERD (gastroesophageal reflux disease)    Hypertension    MRSA (methicillin resistant Staphylococcus aureus)     Past Surgical History:  Procedure Laterality Date   BACK SURGERY     LAPAROSCOPIC GASTRIC BANDING     TRIGGER FINGER RELEASE Right 10/05/2020   Procedure: RELEASE  TRIGGER FINGER RIGHT LONG FINGER;  Surgeon: Leandrew Koyanagi, MD;  Location: [facility];  Service: Orthopedics;  Laterality: Right;    Family History  Problem Relation Age of Onset   Heart disease Mother    Hypertension Mother    Diabetes Sister    Hypertension Sister    Stroke Brother    Diabetes Brother     Heart disease Brother    Heart attack Brother    Hypertension Brother    Hypertension Father     Social History Reviewed with no changes to be made today.   Outpatient Medications Prior to Visit  Medication Sig Dispense Refill   atorvastatin (LIPITOR) 40 MG tablet Take 1 tablet (40 mg total) by mouth daily. 90 tablet 3   Blood Glucose Monitoring Suppl (TRUE METRIX METER) w/Device KIT Use as instructed 1 kit 0   enalapril (VASOTEC) 20 MG tablet Take 2 tablets (40 mg total) by mouth daily. 180 tablet 1   glimepiride (AMARYL) 4 MG tablet Take 1 tablet (4 mg total) by mouth daily with breakfast. 90 tablet 1   glucose blood (TRUE METRIX BLOOD GLUCOSE TEST) test strip Use as instructed. Check blood glucose level by fingerstick twice per day. 200 each 12   omeprazole (PRILOSEC) 20 MG capsule TAKE 1 CAPSULE (20 MG TOTAL) BY MOUTH 2 (TWO) TIMES DAILY BEFORE A MEAL. 180 capsule 3   pneumococcal 23 valent vaccine (PNEUMOVAX 23) 25 MCG/0.5ML injection To be administered by the Pharmacist 0.5 mL 0   sitaGLIPtin-metformin (JANUMET) 50-1000 MG tablet Take 1 tablet by mouth 2 (two) times daily with a meal. 180 tablet 1   TRUEplus Lancets 28G MISC Use as instructed. Check blood glucose level by fingerstick twice per day. 200 each 6   amLODipine (NORVASC) 5 MG tablet TAKE 1 TABLET (5 MG TOTAL) BY MOUTH DAILY. MAY TAKE AN EXTRA 1/2 TABLET (2.5 MG) FOR SYSTOLIC GREATER THAN 466 90 tablet 1   acetaminophen-codeine (TYLENOL #3) 300-30 MG tablet TAKE 1-2 TABLETS BY MOUTH EVERY 8 (EIGHT) HOURS AS NEEDED FOR MODERATE PAIN. (Patient not taking: Reported on 06/07/2021) 30 tablet 0   predniSONE (DELTASONE) 10 MG tablet TAKE 6 TABS BY MOUTH ON DAY 1; 5 TABS ON DAY 2; 4 TABS ON DAY 3; 3 TABS ON DAY 4; 2 TABS ON DAY 5; 1 TAB ON DAY 6 THEN STOP (Patient not taking: Reported on 06/07/2021) 21 tablet 0   predniSONE (STERAPRED UNI-PAK 21 TAB) 10 MG (21) TBPK tablet Take as directed (Patient not taking: Reported on 06/07/2021) 21  tablet 0   No facility-administered medications prior to visit.    Allergies  Allergen Reactions   Tramadol     seizure       Objective:    BP (!) 149/92   Pulse 65   Ht _0  (1.702 m)   Wt 184 lb (83.5 kg)   SpO2 100%   BMI 28.82 kg/m  Wt Readings from Last 3 Encounters:  06/07/21 184 lb (83.5 kg)  11/03/20 185 lb (83.9 kg)  10/05/20 185 lb 6.5 oz (84.1 kg)    Physical Exam Vitals and nursing note reviewed.  Constitutional:      Appearance: He is well-developed.  HENT:     Head: Normocephalic and atraumatic.  Cardiovascular:     Rate and Rhythm: Normal rate and regular rhythm.     Heart sounds: Normal heart sounds. No murmur heard.   No friction rub. No gallop.  Pulmonary:  Effort: Pulmonary effort is normal. No tachypnea or respiratory distress.     Breath sounds: Normal breath sounds. No decreased breath sounds, wheezing, rhonchi or rales.  Chest:     Chest wall: No tenderness.  Abdominal:     General: Bowel sounds are normal.     Palpations: Abdomen is soft.  Musculoskeletal:        General: Normal range of motion.     Cervical back: Normal range of motion.     Comments: Copper sleeve on right knee  Skin:    General: Skin is warm and dry.  Neurological:     Mental Status: He is alert and oriented to person, place, and time.     Coordination: Coordination normal.  Psychiatric:        Behavior: Behavior normal. Behavior is cooperative.        Thought Content: Thought content normal.        Judgment: Judgment normal.         Patient has been counseled extensively about nutrition and exercise as well as the importance of adherence with medications and regular follow-up. The patient was given clear instructions to go to ER or return to medical center if symptoms don't improve, worsen or new problems develop. The patient verbalized understanding.   Follow-up: Return in 3 months (on 08/25/2021).   [name], FNP-BC [facility] and [facility] [place], [place]   06/07/2021, 8:54 AM

## 2021-06-08 LAB — CMP14+EGFR
ALT: 20 IU/L (ref 0–44)
AST: 17 IU/L (ref 0–40)
Albumin/Globulin Ratio: 1.6 (ref 1.2–2.2)
Albumin: 4.7 g/dL (ref 3.8–4.9)
Alkaline Phosphatase: 63 IU/L (ref 44–121)
BUN/Creatinine Ratio: 11 (ref 9–20)
BUN: 12 mg/dL (ref 6–24)
Bilirubin Total: 0.3 mg/dL (ref 0.0–1.2)
CO2: 23 mmol/L (ref 20–29)
Calcium: 10.4 mg/dL — ABNORMAL HIGH (ref 8.7–10.2)
Chloride: 102 mmol/L (ref 96–106)
Creatinine, Ser: 1.05 mg/dL (ref 0.76–1.27)
Globulin, Total: 2.9 g/dL (ref 1.5–4.5)
Glucose: 96 mg/dL (ref 70–99)
Potassium: 5.4 mmol/L — ABNORMAL HIGH (ref 3.5–5.2)
Sodium: 144 mmol/L (ref 134–144)
Total Protein: 7.6 g/dL (ref 6.0–8.5)
eGFR: 84 mL/min/{1.73_m2} (ref >59)

## 2021-06-08 LAB — CBC
Hematocrit: 37.3 % — ABNORMAL LOW (ref 37.5–51.0)
Hemoglobin: 12.1 g/dL — ABNORMAL LOW (ref 13.0–17.7)
MCH: 28.1 pg (ref 26.6–33.0)
MCHC: 32.4 g/dL (ref 31.5–35.7)
MCV: 87 fL (ref 79–97)
Platelets: 274 10*3/uL (ref 150–450)
RBC: 4.3 x10E6/uL (ref 4.14–5.80)
RDW: 13.3 % (ref 11.6–15.4)
WBC: 6.7 10*3/uL (ref 3.4–10.8)

## 2021-06-08 LAB — IRON,TIBC AND FERRITIN PANEL
Ferritin: 14 ng/mL — ABNORMAL LOW (ref 30–400)
Iron Saturation: 10 % — ABNORMAL LOW (ref 15–55)
Iron: 40 ug/dL (ref 38–169)
Total Iron Binding Capacity: 401 ug/dL (ref 250–450)
UIBC: 361 ug/dL — ABNORMAL HIGH (ref 111–343)

## 2021-06-08 LAB — HEMOGLOBIN A1C
Est. average glucose Bld gHb Est-mCnc: 140 mg/dL
Hgb A1c MFr Bld: 6.5 % — ABNORMAL HIGH (ref 4.8–5.6)

## 2021-06-08 LAB — THYROID PANEL WITH TSH
Free Thyroxine Index: 2 (ref 1.2–4.9)
T3 Uptake Ratio: 24 % (ref 24–39)
T4, Total: 8.5 ug/dL (ref 4.5–12.0)
TSH: 2.76 u[IU]/mL (ref 0.450–4.500)

## 2021-06-08 LAB — HIV ANTIBODY (ROUTINE TESTING W REFLEX): HIV Screen 4th Generation wRfx: NONREACTIVE

## 2021-06-13 ENCOUNTER — Other Ambulatory Visit: Payer: Self-pay

## 2021-07-06 ENCOUNTER — Other Ambulatory Visit: Payer: Self-pay

## 2021-07-07 ENCOUNTER — Other Ambulatory Visit: Payer: Self-pay

## 2021-07-13 ENCOUNTER — Ambulatory Visit (HOSPITAL_COMMUNITY)
Admission: EM | Admit: 2021-07-13 | Discharge: 2021-07-13 | Disposition: A | Discharge status: Home / Self Care | Payer: Self-pay | Attending: Emergency Medicine | Admitting: Emergency Medicine

## 2021-07-13 ENCOUNTER — Other Ambulatory Visit: Payer: Self-pay

## 2021-07-13 ENCOUNTER — Encounter (HOSPITAL_COMMUNITY): Payer: Self-pay | Admitting: *Deleted

## 2021-07-13 DIAGNOSIS — M5432 Sciatica, left side: Secondary | ICD-10-CM

## 2021-07-13 MED ORDER — METHYLPREDNISOLONE 4 MG PO TBPK
ORAL_TABLET | Freq: Every day | ORAL | 0 refills | Status: DC
  Filled 2021-07-13: qty 21, 6d supply, fill #0

## 2021-07-13 NOTE — ED Provider Notes (Signed)
HPI  SUBJECTIVE:  Kevin [name] is a 55 y.o. male who presents with 1 and half weeks of left posterior leg pain wrapping around to his shin.  States that it started off as a knot/tightness in his buttock.  He describes the pain as sharp, and now located primarily in his anterior shin.  He was on a recent long car trip 1-1/2 weeks ago.  No posterior calf, popliteal, medial thigh pain, calf swelling, shortness of breath, hemoptysis, new or different back pain, recent heavy lifting, change in his physical activity or rash.  He has not tried anything for this.  Symptoms are better when he crosses his left leg over the right.  Symptoms are worse with dorsiflexion/plantarflexion and movement.  He states that this is identical to previous episodes of sciatica.  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  He is currently not on anticoagulants.  No history of hypercoagulability, cancer.  YTK:[id number], Vernia Buff, NP   Past Medical History:  Diagnosis Date   COVID 04/20/2020   Diabetes mellitus without complication (HCC)    GERD (gastroesophageal reflux disease)    Hypertension    MRSA (methicillin resistant Staphylococcus aureus)     Past Surgical History:  Procedure Laterality Date   BACK SURGERY     LAPAROSCOPIC GASTRIC BANDING     TRIGGER FINGER RELEASE Right 10/05/2020   Procedure: RELEASE TRIGGER FINGER RIGHT LONG FINGER;  Surgeon: Leandrew Koyanagi, MD;  Location: [facility];  Service: Orthopedics;  Laterality: Right;    Family History  Problem Relation Age of Onset   Heart disease Mother    Hypertension Mother    Diabetes Sister    Hypertension Sister    Stroke Brother    Diabetes Brother    Heart disease Brother    Heart attack Brother    Hypertension Brother    Hypertension Father     Social  History   Tobacco Use   Smoking status: Former    Types: Cigarettes    Quit date: 2000    Years since quitting: 22.8   Smokeless tobacco: Never  Vaping Use   Vaping Use: Never used  Substance Use Topics   Alcohol use: No   Drug use: No    No current facility-administered medications for this encounter.  Current Outpatient Medications:    methylPREDNISolone (MEDROL DOSEPAK) 4 MG TBPK tablet, Take by mouth daily. Follow package instructions, Disp: 21 tablet, Rfl: 0   amLODipine (NORVASC) 10 MG tablet, Take 1 tablet (10 mg total) by mouth daily., Disp: 90 tablet, Rfl: 1   atorvastatin (LIPITOR) 40 MG tablet, Take 1 tablet (40 mg total) by mouth daily., Disp: 90 tablet, Rfl: 3   Blood Glucose Monitoring Suppl (TRUE METRIX METER) w/Device KIT, Use as instructed, Disp: 1 kit, Rfl: 0   enalapril (VASOTEC) 20 MG tablet, Take 2 tablets (40 mg total) by mouth daily., Disp: 180 tablet, Rfl: 1   glimepiride (AMARYL) 4 MG tablet, Take 1 tablet (4 mg total) by mouth daily with breakfast., Disp: 90 tablet, Rfl: 1   glucose blood (TRUE METRIX BLOOD GLUCOSE TEST) test strip, Use as instructed. Check blood glucose level by fingerstick twice per day., Disp: 200 each, Rfl: 12   omeprazole (PRILOSEC) 20 MG capsule, TAKE 1 CAPSULE (20 MG TOTAL) BY MOUTH 2 (TWO) TIMES DAILY BEFORE  A MEAL., Disp: 180 capsule, Rfl: 3   pneumococcal 23 valent vaccine (PNEUMOVAX 23) 25 MCG/0.5ML injection, To be administered by the Pharmacist, Disp: 0.5 mL, Rfl: 0   sitaGLIPtin-metformin (JANUMET) 50-1000 MG tablet, Take 1 tablet by mouth 2 (two) times daily with a meal., Disp: 180 tablet, Rfl: 1   TRUEplus Lancets 28G MISC, Use as instructed. Check blood glucose level by fingerstick twice per day., Disp: 200 each, Rfl: 6  Allergies  Allergen Reactions   Tramadol     seizure     ROS  As noted in HPI.   Physical Exam  BP (!) 172/102   Pulse 95   Temp 99 F (37.2 C)   Resp 16   SpO2 98%   Constitutional: Well  developed, well nourished, no acute distress Eyes:  EOMI, conjunctiva normal bilaterally HENT: Normocephalic, atraumatic,mucus membranes moist Respiratory: Normal inspiratory effort Cardiovascular: Normal rate GI: nondistended skin: No rash in the area of pain, skin intact Musculoskeletal: Left thigh, calf normal appearance.  Calves symmetric, nontender.  No anterior tenderness, no tenderness along the sciatic nerve.  No posterior calf tenderness, popliteal tenderness, medial thigh tenderness.  No palpable cord.  No erythema.  Sensation distally grossly intact.  DP 2+.  Cap refill less than 2 seconds.  SLR positive left side.  No L-spine, paralumbar tenderness, spasm. Neurologic: Alert & oriented x 3, no focal neuro deficits Psychiatric: Speech and behavior appropriate   ED Course   Medications - No data to display  No orders of the defined types were placed in this encounter.   No results found for this or any previous visit (from the past 24 hour(s)). No results found.  ED Clinical Impression  1. Sciatica of left side      ED Assessment/Plan  Wells score 0.  This appears to be a sciatica.  Seriously doubt DVT given similarity of pain to sciatica, distribution of pain.  He has no physical exam findings concerning for DVT.  Will send home with a Medrol dose pack.  Follow-up with PMD if not getting any better after finishing the Medrol Dosepak.  ER return precautions given  Blood pressure noted.  He is uncomfortable here.  Patient will keep an eye on this.  Discussed MDM, treatment plan, and plan for follow-up with patient. Discussed sn/sx that should prompt return to the ED. patient agrees with plan.   Meds ordered this encounter  Medications   methylPREDNISolone (MEDROL DOSEPAK) 4 MG TBPK tablet    Sig: Take by mouth daily. Follow package instructions    Dispense:  21 tablet    Refill:  0      *This clinic note was created using Dragon dictation software. Therefore,  there may be occasional mistakes despite careful proofreading.  ?    Melynda Ripple, MD 07/14/21 [phone]

## 2021-07-13 NOTE — Discharge Instructions (Addendum)
I seriously doubt a blood clot in your leg.  This appears to be more of a sciatic nerve inflammation and irritation.  Try Medrol Dosepak for the nerve inflammation.  This should help it resolve sooner rather than later.  It will elevate your sugars however.  Go immediately to the emergency room for shortness of breath, coughing up blood, if your calf swells, or for any other concerns.

## 2021-07-13 NOTE — ED Triage Notes (Signed)
LT leg pain

## 2021-07-20 ENCOUNTER — Other Ambulatory Visit: Payer: Self-pay

## 2021-08-07 ENCOUNTER — Other Ambulatory Visit: Payer: Self-pay

## 2021-08-08 ENCOUNTER — Other Ambulatory Visit: Payer: Self-pay

## 2021-08-25 ENCOUNTER — Other Ambulatory Visit: Payer: Self-pay

## 2021-08-25 ENCOUNTER — Ambulatory Visit: Payer: Self-pay | Attending: Nurse Practitioner | Admitting: Nurse Practitioner

## 2021-08-25 ENCOUNTER — Encounter: Payer: Self-pay | Admitting: Nurse Practitioner

## 2021-08-25 VITALS — BP 124/81 | HR 86 | Ht 67.0 in | Wt 183.5 lb

## 2021-08-25 DIAGNOSIS — E119 Type 2 diabetes mellitus without complications: Secondary | ICD-10-CM

## 2021-08-25 DIAGNOSIS — M1711 Unilateral primary osteoarthritis, right knee: Secondary | ICD-10-CM

## 2021-08-25 DIAGNOSIS — I10 Essential (primary) hypertension: Secondary | ICD-10-CM

## 2021-08-25 DIAGNOSIS — Z23 Encounter for immunization: Secondary | ICD-10-CM

## 2021-08-25 MED ORDER — ENALAPRIL MALEATE 20 MG PO TABS
40.0000 mg | ORAL_TABLET | Freq: Every day | ORAL | 1 refills | Status: DC
  Filled 2021-08-25 – 2021-09-18 (×2): qty 180, 90d supply, fill #0
  Filled 2021-12-18: qty 180, 90d supply, fill #1

## 2021-08-25 MED ORDER — SITAGLIPTIN PHOS-METFORMIN HCL 50-1000 MG PO TABS
1.0000 | ORAL_TABLET | Freq: Two times a day (BID) | ORAL | 1 refills | Status: DC
  Filled 2021-08-25: qty 180, 90d supply, fill #0
  Filled 2021-09-02 – 2021-10-02 (×2): qty 60, 30d supply, fill #0
  Filled 2021-10-02 – 2021-10-29 (×2): qty 60, 30d supply, fill #1
  Filled 2021-11-26: qty 60, 30d supply, fill #2
  Filled 2021-12-25: qty 40, 20d supply, fill #3
  Filled 2022-02-09: qty 60, 30d supply, fill #4

## 2021-08-25 MED ORDER — GLIMEPIRIDE 4 MG PO TABS
4.0000 mg | ORAL_TABLET | Freq: Every day | ORAL | 1 refills | Status: DC
  Filled 2021-08-25: qty 90, 90d supply, fill #0
  Filled 2021-09-02 – 2021-10-02 (×2): qty 30, 30d supply, fill #0
  Filled 2021-10-02 – 2021-10-29 (×2): qty 30, 30d supply, fill #1
  Filled 2021-11-26: qty 30, 30d supply, fill #2
  Filled 2021-12-25: qty 30, 30d supply, fill #3
  Filled 2022-02-09: qty 30, 30d supply, fill #4

## 2021-08-25 NOTE — Progress Notes (Signed)
Assessment & Plan:  [name] was seen today for hypertension.  Diagnoses and all orders for this visit:  Essential hypertension -     enalapril (VASOTEC) 20 MG tablet; Take 2 tablets (40 mg total) by mouth daily. Please fill as a 90 day supply Continue all antihypertensives as prescribed.  Remember to bring in your blood pressure log with you for your follow up appointment.  DASH/Mediterranean Diets are healthier choices for HTN.    Need for pneumococcal vaccination -     Pneumococcal conjugate vaccine 20-valent  Diabetes mellitus without complication ([place]) -     Hemoglobin A1c; Future -     sitaGLIPtin-metformin (JANUMET) 50-1000 MG tablet; Take 1 tablet by mouth 2 (two) times daily with a meal. Please fill as a 90 day supply -     glimepiride (AMARYL) 4 MG tablet; Take 1 tablet (4 mg total) by mouth daily with breakfast. Please fill as a 90 day supply  Primary osteoarthritis of right knee Continue conservative management only at this time X-ray of right knee 11-14-2020: Minimal patellofemoral osteoarthrosis.   Patient has been counseled on age-appropriate routine health concerns for screening and prevention. These are reviewed and up-to-date. Referrals have been placed accordingly. Immunizations are up-to-date or declined.    Subjective:   Chief Complaint  Patient presents with   Hypertension   HPI Kevin [name] 55 y.o. male presents to office today  He has a past medical history of COVID (04/20/2020), Diabetes mellitus without complication, GERD, Hypertension, MRSA  and Low back pain with left sided sciatica.    Hypertension  Blood pressure is well controlled with amlodipine 10 mg daily and enalapril 40 mg daily BP Readings from Last 3 Encounters:  08/25/21 124/81  07/13/21 (!) 172/102  06/07/21 (!) 149/92    Chronic Low back pain He broke his back as a teenager and has chronic low back pain.  Endorses worsening left-sided sciatica.  He declines any neuropathic or pain  medicine for this today.  He also declines spine specialist consult.  He has had multiple back surgeries throughout his lifetime and is currently going to manage his pain conservatively.  OA He has chronic right and left knee pain.  Associated symptoms include intermittent swelling. Aggravating factors: Walking or standing for prolonged periods of time.  He denies any injury or trauma to the knee.   Review of Systems  Constitutional:  Negative for fever, malaise/fatigue and weight loss.  HENT: Negative.  Negative for nosebleeds.   Eyes: Negative.  Negative for blurred vision, double vision and photophobia.  Respiratory: Negative.  Negative for cough and shortness of breath.   Cardiovascular: Negative.  Negative for chest pain, palpitations and leg swelling.  Gastrointestinal: Negative.  Negative for heartburn, nausea and vomiting.  Musculoskeletal:  Positive for back pain and joint pain. Negative for myalgias.  Neurological: Negative.  Negative for dizziness, focal weakness, seizures and headaches.  Psychiatric/Behavioral: Negative.  Negative for suicidal ideas.    Past Medical History:  Diagnosis Date   COVID 04/20/2020   Diabetes mellitus without complication (HCC)    GERD (gastroesophageal reflux disease)    Hypertension    MRSA (methicillin resistant Staphylococcus aureus)     Past Surgical History:  Procedure Laterality Date   BACK SURGERY     LAPAROSCOPIC GASTRIC BANDING     TRIGGER FINGER RELEASE Right 10/05/2020   Procedure: RELEASE TRIGGER FINGER RIGHT LONG FINGER;  Surgeon: Kevin Koyanagi, MD;  Location: [facility];  Service: Orthopedics;  Laterality: Right;    Family History  Problem Relation Age of Onset   Heart disease Mother    Hypertension Mother    Diabetes Sister    Hypertension Sister    Stroke Brother    Diabetes Brother    Heart disease Brother    Heart attack Brother    Hypertension Brother    Hypertension Father     Social History  Reviewed with no changes to be made today.   Outpatient Medications Prior to Visit  Medication Sig Dispense Refill   amLODipine (NORVASC) 10 MG tablet Take 1 tablet (10 mg total) by mouth daily. 90 tablet 1   atorvastatin (LIPITOR) 40 MG tablet Take 1 tablet (40 mg total) by mouth daily. 90 tablet 3   Blood Glucose Monitoring Suppl (TRUE METRIX METER) w/Device KIT Use as instructed 1 kit 0   glucose blood (TRUE METRIX BLOOD GLUCOSE TEST) test strip Use as instructed. Check blood glucose level by fingerstick twice per day. 200 each 12   omeprazole (PRILOSEC) 20 MG capsule TAKE 1 CAPSULE (20 MG TOTAL) BY MOUTH 2 (TWO) TIMES DAILY BEFORE A MEAL. 180 capsule 3   pneumococcal 23 valent vaccine (PNEUMOVAX 23) 25 MCG/0.5ML injection To be administered by the Pharmacist 0.5 mL 0   TRUEplus Lancets 28G MISC Use as instructed. Check blood glucose level by fingerstick twice per day. 200 each 6   enalapril (VASOTEC) 20 MG tablet Take 2 tablets (40 mg total) by mouth daily. 180 tablet 1   glimepiride (AMARYL) 4 MG tablet Take 1 tablet (4 mg total) by mouth daily with breakfast. 90 tablet 1   sitaGLIPtin-metformin (JANUMET) 50-1000 MG tablet Take 1 tablet by mouth 2 (two) times daily with a meal. 180 tablet 1   methylPREDNISolone (MEDROL DOSEPAK) 4 MG TBPK tablet Take by mouth daily. Follow package instructions (Patient not taking: Reported on 08/25/2021) 21 tablet 0   No facility-administered medications prior to visit.    Allergies  Allergen Reactions   Tramadol     seizure       Objective:    BP 124/81    Pulse 86    Ht 5' 7"  (1.702 m)    Wt 183 lb 8 oz (83.2 kg)    SpO2 98%    BMI 28.74 kg/m  Wt Readings from Last 3 Encounters:  08/25/21 183 lb 8 oz (83.2 kg)  06/07/21 184 lb (83.5 kg)  11/03/20 185 lb (83.9 kg)    Physical Exam Vitals and nursing note reviewed.  Constitutional:      Appearance: He is well-developed.  HENT:     Head: Normocephalic and atraumatic.  Cardiovascular:      Rate and Rhythm: Normal rate and regular rhythm.     Heart sounds: Normal heart sounds. No murmur heard.   No friction rub. No gallop.  Pulmonary:     Effort: Pulmonary effort is normal. No tachypnea or respiratory distress.     Breath sounds: Normal breath sounds. No decreased breath sounds, wheezing, rhonchi or rales.  Chest:     Chest wall: No tenderness.  Abdominal:     General: Bowel sounds are normal.     Palpations: Abdomen is soft.  Musculoskeletal:        General: Normal range of motion.     Cervical back: Normal range of motion.  Skin:    General: Skin is warm and dry.  Neurological:     Mental Status: He is alert and oriented to person, place,  and time.     Coordination: Coordination normal.  Psychiatric:        Behavior: Behavior normal. Behavior is cooperative.        Thought Content: Thought content normal.        Judgment: Judgment normal.         Patient has been counseled extensively about nutrition and exercise as well as the importance of adherence with medications and regular follow-up. The patient was given clear instructions to go to ER or return to medical center if symptoms don't improve, worsen or new problems develop. The patient verbalized understanding.   Follow-up: Return in about 3 months (around 11/23/2021).   [name], FNP-BC [facility] and [facility], [place]   08/25/2021, 3:50 PM

## 2021-09-05 ENCOUNTER — Other Ambulatory Visit: Payer: Self-pay

## 2021-09-07 ENCOUNTER — Other Ambulatory Visit: Payer: Self-pay

## 2021-09-18 ENCOUNTER — Other Ambulatory Visit: Payer: Self-pay

## 2021-09-19 ENCOUNTER — Other Ambulatory Visit: Payer: Self-pay

## 2021-09-20 ENCOUNTER — Other Ambulatory Visit: Payer: Self-pay

## 2021-10-02 ENCOUNTER — Other Ambulatory Visit: Payer: Self-pay

## 2021-10-05 ENCOUNTER — Other Ambulatory Visit: Payer: Self-pay

## 2021-10-30 ENCOUNTER — Other Ambulatory Visit: Payer: Self-pay

## 2021-11-01 ENCOUNTER — Other Ambulatory Visit: Payer: Self-pay

## 2021-11-03 ENCOUNTER — Other Ambulatory Visit: Payer: Self-pay

## 2021-11-27 ENCOUNTER — Other Ambulatory Visit: Payer: Self-pay

## 2021-11-29 ENCOUNTER — Other Ambulatory Visit: Payer: Self-pay

## 2021-12-08 ENCOUNTER — Other Ambulatory Visit: Payer: Self-pay

## 2021-12-18 ENCOUNTER — Encounter: Payer: Self-pay | Admitting: Nurse Practitioner

## 2021-12-18 ENCOUNTER — Other Ambulatory Visit: Payer: Self-pay

## 2021-12-18 ENCOUNTER — Other Ambulatory Visit: Payer: Self-pay | Admitting: Nurse Practitioner

## 2021-12-18 DIAGNOSIS — I10 Essential (primary) hypertension: Secondary | ICD-10-CM

## 2021-12-18 MED ORDER — AMLODIPINE BESYLATE 10 MG PO TABS
10.0000 mg | ORAL_TABLET | Freq: Every day | ORAL | 1 refills | Status: DC
  Filled 2021-12-18: qty 90, 90d supply, fill #0

## 2021-12-19 ENCOUNTER — Other Ambulatory Visit: Payer: Self-pay

## 2021-12-20 ENCOUNTER — Other Ambulatory Visit: Payer: Self-pay

## 2021-12-25 ENCOUNTER — Other Ambulatory Visit: Payer: Self-pay

## 2021-12-28 ENCOUNTER — Other Ambulatory Visit: Payer: Self-pay

## 2022-01-17 ENCOUNTER — Other Ambulatory Visit: Payer: Self-pay

## 2022-01-30 ENCOUNTER — Other Ambulatory Visit: Payer: Self-pay

## 2022-02-09 ENCOUNTER — Other Ambulatory Visit: Payer: Self-pay

## 2022-02-26 ENCOUNTER — Ambulatory Visit: Payer: Self-pay | Attending: Nurse Practitioner | Admitting: Nurse Practitioner

## 2022-02-26 ENCOUNTER — Encounter: Payer: Self-pay | Admitting: Nurse Practitioner

## 2022-02-26 ENCOUNTER — Other Ambulatory Visit: Payer: Self-pay

## 2022-02-26 VITALS — BP 127/82 | HR 92 | Temp 98.0°F | Resp 20 | Wt 192.0 lb

## 2022-02-26 DIAGNOSIS — E119 Type 2 diabetes mellitus without complications: Secondary | ICD-10-CM

## 2022-02-26 DIAGNOSIS — D649 Anemia, unspecified: Secondary | ICD-10-CM

## 2022-02-26 DIAGNOSIS — E785 Hyperlipidemia, unspecified: Secondary | ICD-10-CM

## 2022-02-26 DIAGNOSIS — I739 Peripheral vascular disease, unspecified: Secondary | ICD-10-CM | POA: Insufficient documentation

## 2022-02-26 DIAGNOSIS — E1169 Type 2 diabetes mellitus with other specified complication: Secondary | ICD-10-CM

## 2022-02-26 DIAGNOSIS — I10 Essential (primary) hypertension: Secondary | ICD-10-CM

## 2022-02-26 LAB — GLUCOSE, POCT (MANUAL RESULT ENTRY): POC Glucose: 122 mg/dl — AB (ref 70–99)

## 2022-02-26 LAB — POCT GLYCOSYLATED HEMOGLOBIN (HGB A1C): HbA1c, POC (controlled diabetic range): 6.5 % (ref 0.0–7.0)

## 2022-02-26 MED ORDER — GLIMEPIRIDE 4 MG PO TABS
4.0000 mg | ORAL_TABLET | Freq: Every day | ORAL | 1 refills | Status: DC
  Filled 2022-02-26 – 2022-03-08 (×2): qty 90, 90d supply, fill #0
  Filled 2022-06-02: qty 90, 90d supply, fill #1

## 2022-02-26 MED ORDER — SITAGLIPTIN PHOS-METFORMIN HCL 50-1000 MG PO TABS
1.0000 | ORAL_TABLET | Freq: Two times a day (BID) | ORAL | 1 refills | Status: DC
  Filled 2022-02-26 – 2022-03-08 (×2): qty 180, 90d supply, fill #0
  Filled 2022-03-09: qty 120, 60d supply, fill #0
  Filled 2022-05-14: qty 180, 90d supply, fill #1
  Filled 2022-08-07: qty 180, 90d supply, fill #2
  Filled 2022-08-13: qty 60, 30d supply, fill #2

## 2022-02-26 MED ORDER — AMLODIPINE BESYLATE 10 MG PO TABS
10.0000 mg | ORAL_TABLET | Freq: Every day | ORAL | 1 refills | Status: DC
  Filled 2022-02-26 – 2022-04-05 (×2): qty 90, 90d supply, fill #0
  Filled 2022-07-22: qty 90, 90d supply, fill #1

## 2022-02-26 MED ORDER — ATORVASTATIN CALCIUM 40 MG PO TABS
40.0000 mg | ORAL_TABLET | Freq: Every day | ORAL | 3 refills | Status: DC
  Filled 2022-02-26: qty 90, 90d supply, fill #0
  Filled 2022-05-26 – 2022-05-28 (×3): qty 90, 90d supply, fill #1
  Filled 2022-08-27: qty 90, 90d supply, fill #2
  Filled 2022-12-17: qty 90, 90d supply, fill #3

## 2022-02-26 NOTE — Progress Notes (Signed)
Assessment & Plan:  Kevin [name] was seen today for knee pain, hypertension and diabetes.  Diagnoses and all orders for this visit:  Essential hypertension -     amLODipine (NORVASC) 10 MG tablet; Take 1 tablet (10 mg total) by mouth daily. -     CMP14+EGFR  Diabetes mellitus without complication (HCC) -     sitaGLIPtin-metformin (JANUMET) 50-1000 MG tablet; Take 1 tablet by mouth 2 (two) times daily with a meal. Please fill as a 90 day supply -     glimepiride (AMARYL) 4 MG tablet; Take 1 tablet (4 mg total) by mouth daily with breakfast. Please fill as a 90 day supply -     Glucose (CBG) -     HgB A1c -     CMP14+EGFR  Hyperlipidemia associated with type 2 diabetes mellitus (HCC) -     atorvastatin (LIPITOR) 40 MG tablet; Take 1 tablet (40 mg total) by mouth daily. Please fill as a 90 day supply -     Lipid panel  Low hemoglobin -     CBC with Differential  PVD (peripheral vascular disease) ([place]) Follow up with vascular for arteriogram that was not performed due to [place]   Patient has been counseled on age-appropriate routine health concerns for screening and prevention. These are reviewed and up-to-date. Referrals have been placed accordingly. Immunizations are up-to-date or declined.    Subjective:   Chief Complaint  Patient presents with   Knee Pain   Hypertension   Diabetes   HPI Kevin [name] 56 y.o. male presents to office today for follow up to HTN and DM. He has a past medical history of COVID (04/20/2020), DM2, GERD, Hypertension, and MRSA    Chronic Knee Pain Onset several years ago with initial x-ray of right knee in 2017.  No acute findings at that time.  He does endorse extensive walking throughout the day.  Knee pain has been worsening over the past month.  Pain is felt in the posterior knee and described as aching.  Aggravating factors: Walking or standing for prolonged periods of time.  He denies any injury or trauma to the knee.  He is currently seeing Ortho  for his right trigger finger and will have release surgery in a few weeks.  We may need to refer him for his knee back to Ortho based on imaging.  PVD Chronic.  The pain is described as numbness, aching and burning, and is 8/10 in intensity.  Associated symptoms include discoloration of the 2 great toes and bilateral second toes.  The patient's risks factors for atherosclerosis include diabetes mellitus, hypercholesterolemia, hypertension and former smoker.  The patient denies a history of arteriosclerotic heart disease and cerebral vascular disease. He has been evaluated for this problem in the past by Dr. Carlis [name] with Vascular Surgery and CTA abdomen pelvis bilateral extremity runoff with CT chest was planned however it was canceled due to [place].    DM 2 Diabetes is well controlled with glimepiride 4 mg daily and Janumet 50-1000 mg twice daily.  LDL not quite at goal however he endorses adherence taking atorvastatin 40 mg daily. Likely a bit of dietary non adherence is contributing to LDL not at goal Lab Results  Component Value Date   HGBA1C 6.5 02/26/2022    Lab Results  Component Value Date   HGBA1C 6.5 (H) 06/07/2021    Lab Results  Component Value Date   LDLCALC 88 12/27/2020     HTN Blood pressure well controlled with  amlodipine 10 mg daily and enalapril 40 mg daily. BP Readings from Last 3 Encounters:  02/26/22 127/82  08/25/21 124/81  07/13/21 (!) 172/102    Review of Systems  Constitutional:  Negative for fever, malaise/fatigue and weight loss.  HENT: Negative.  Negative for nosebleeds.   Eyes: Negative.  Negative for blurred vision, double vision and photophobia.  Respiratory: Negative.  Negative for cough and shortness of breath.   Cardiovascular: Negative.  Negative for chest pain, palpitations and leg swelling.  Gastrointestinal: Negative.  Negative for heartburn, nausea and vomiting.  Musculoskeletal:  Positive for joint pain. Negative for myalgias.  Neurological:   Positive for sensory change. Negative for dizziness, focal weakness, seizures and headaches.  Psychiatric/Behavioral: Negative.  Negative for suicidal ideas.     Past Medical History:  Diagnosis Date   COVID 04/20/2020   Diabetes mellitus without complication (HCC)    GERD (gastroesophageal reflux disease)    Hypertension    MRSA (methicillin resistant Staphylococcus aureus)     Past Surgical History:  Procedure Laterality Date   BACK SURGERY     LAPAROSCOPIC GASTRIC BANDING     TRIGGER FINGER RELEASE Right 10/05/2020   Procedure: RELEASE TRIGGER FINGER RIGHT LONG FINGER;  Surgeon: Leandrew Koyanagi, MD;  Location: [facility];  Service: Orthopedics;  Laterality: Right;    Family History  Problem Relation Age of Onset   Heart disease Mother    Hypertension Mother    Diabetes Sister    Hypertension Sister    Stroke Brother    Diabetes Brother    Heart disease Brother    Heart attack Brother    Hypertension Brother    Hypertension Father     Social History Reviewed with no changes to be made today.   Outpatient Medications Prior to Visit  Medication Sig Dispense Refill   Blood Glucose Monitoring Suppl (TRUE METRIX METER) w/Device KIT Use as instructed 1 kit 0   enalapril (VASOTEC) 20 MG tablet Take 2 tablets (40 mg total) by mouth daily. Please fill as a 90 day supply 180 tablet 1   glucose blood (TRUE METRIX BLOOD GLUCOSE TEST) test strip Use as instructed. Check blood glucose level by fingerstick twice per day. 200 each 12   omeprazole (PRILOSEC) 20 MG capsule TAKE 1 CAPSULE (20 MG TOTAL) BY MOUTH 2 (TWO) TIMES DAILY BEFORE A MEAL. 180 capsule 3   TRUEplus Lancets 28G MISC Use as instructed. Check blood glucose level by fingerstick twice per day. 200 each 6   amLODipine (NORVASC) 10 MG tablet Take 1 tablet (10 mg total) by mouth daily. 90 tablet 1   atorvastatin (LIPITOR) 40 MG tablet Take 1 tablet (40 mg total) by mouth daily. 90 tablet 3   glimepiride (AMARYL)  4 MG tablet Take 1 tablet (4 mg total) by mouth daily with breakfast. 90 tablet 1   pneumococcal 23 valent vaccine (PNEUMOVAX 23) 25 MCG/0.5ML injection To be administered by the Pharmacist 0.5 mL 0   sitaGLIPtin-metformin (JANUMET) 50-1000 MG tablet Take 1 tablet by mouth 2 (two) times daily with a meal. 180 tablet 1   No facility-administered medications prior to visit.    Allergies  Allergen Reactions   Tramadol     seizure       Objective:    BP 127/82   Pulse 92   Temp 98 F (36.7 C) (Oral)   Resp 20   Wt 192 lb (87.1 kg)   SpO2 97%   BMI 30.07 kg/m  Wt Readings from Last 3 Encounters:  02/26/22 192 lb (87.1 kg)  08/25/21 183 lb 8 oz (83.2 kg)  06/07/21 184 lb (83.5 kg)    Physical Exam Vitals and nursing note reviewed.  Constitutional:      Appearance: He is well-developed.  HENT:     Head: Normocephalic and atraumatic.  Cardiovascular:     Rate and Rhythm: Normal rate and regular rhythm.     Heart sounds: Normal heart sounds. No murmur heard.    No friction rub. No gallop.  Pulmonary:     Effort: Pulmonary effort is normal. No tachypnea or respiratory distress.     Breath sounds: Normal breath sounds. No decreased breath sounds, wheezing, rhonchi or rales.  Chest:     Chest wall: No tenderness.  Abdominal:     General: Bowel sounds are normal.     Palpations: Abdomen is soft.  Musculoskeletal:        General: Normal range of motion.     Cervical back: Normal range of motion.     Right foot: No swelling.     Left foot: No swelling.     Comments: Wearing right knee sleeve today  Skin:    General: Skin is warm and dry.  Neurological:     Mental Status: He is alert and oriented to person, place, and time.     Coordination: Coordination normal.  Psychiatric:        Behavior: Behavior normal. Behavior is cooperative.        Thought Content: Thought content normal.        Judgment: Judgment normal.          Patient has been counseled extensively  about nutrition and exercise as well as the importance of adherence with medications and regular follow-up. The patient was given clear instructions to go to ER or return to medical center if symptoms don't improve, worsen or new problems develop. The patient verbalized understanding.   Follow-up: Return in about 3 months (around 05/29/2022).   [name], FNP-BC [facility] [place], [place]   02/26/2022, 10:07 AM

## 2022-02-26 NOTE — Progress Notes (Signed)
DM HTN  Concerns with knee discomfort  Pain flucuates with movement   Numbness in toes

## 2022-02-27 LAB — LIPID PANEL
Chol/HDL Ratio: 2.9 ratio (ref 0.0–5.0)
Cholesterol, Total: 143 mg/dL (ref 100–199)
HDL: 50 mg/dL (ref >39)
LDL Chol Calc (NIH): 81 mg/dL (ref 0–99)
Triglycerides: 57 mg/dL (ref 0–149)
VLDL Cholesterol Cal: 12 mg/dL (ref 5–40)

## 2022-02-27 LAB — CMP14+EGFR
ALT: 17 [IU]/L (ref 0–44)
AST: 18 [IU]/L (ref 0–40)
Albumin/Globulin Ratio: 1.9 (ref 1.2–2.2)
Albumin: 5.3 g/dL — ABNORMAL HIGH (ref 3.8–4.9)
Alkaline Phosphatase: 61 [IU]/L (ref 44–121)
BUN/Creatinine Ratio: 11 (ref 9–20)
BUN: 12 mg/dL (ref 6–24)
Bilirubin Total: 0.6 mg/dL (ref 0.0–1.2)
CO2: 21 mmol/L (ref 20–29)
Calcium: 10.2 mg/dL (ref 8.7–10.2)
Chloride: 97 mmol/L (ref 96–106)
Creatinine, Ser: 1.09 mg/dL (ref 0.76–1.27)
Globulin, Total: 2.8 g/dL (ref 1.5–4.5)
Glucose: 119 mg/dL — ABNORMAL HIGH (ref 70–99)
Potassium: 5.4 mmol/L — ABNORMAL HIGH (ref 3.5–5.2)
Sodium: 135 mmol/L (ref 134–144)
Total Protein: 8.1 g/dL (ref 6.0–8.5)
eGFR: 80 mL/min/{1.73_m2}

## 2022-02-27 LAB — CBC WITH DIFFERENTIAL/PLATELET
Basophils Absolute: 0.1 10*3/uL (ref 0.0–0.2)
Basos: 1 %
EOS (ABSOLUTE): 0.1 10*3/uL (ref 0.0–0.4)
Eos: 2 %
Hematocrit: 39.9 % (ref 37.5–51.0)
Hemoglobin: 13.2 g/dL (ref 13.0–17.7)
Immature Grans (Abs): 0 10*3/uL (ref 0.0–0.1)
Immature Granulocytes: 0 %
Lymphocytes Absolute: 1.4 10*3/uL (ref 0.7–3.1)
Lymphs: 24 %
MCH: 28.4 pg (ref 26.6–33.0)
MCHC: 33.1 g/dL (ref 31.5–35.7)
MCV: 86 fL (ref 79–97)
Monocytes Absolute: 0.5 10*3/uL (ref 0.1–0.9)
Monocytes: 8 %
Neutrophils Absolute: 3.8 10*3/uL (ref 1.4–7.0)
Neutrophils: 65 %
Platelets: 263 10*3/uL (ref 150–450)
RBC: 4.64 x10E6/uL (ref 4.14–5.80)
RDW: 13.9 % (ref 11.6–15.4)
WBC: 5.9 10*3/uL (ref 3.4–10.8)

## 2022-02-28 ENCOUNTER — Other Ambulatory Visit: Payer: Self-pay | Admitting: Nurse Practitioner

## 2022-02-28 DIAGNOSIS — R770 Abnormality of albumin: Secondary | ICD-10-CM

## 2022-02-28 DIAGNOSIS — E875 Hyperkalemia: Secondary | ICD-10-CM

## 2022-03-08 ENCOUNTER — Other Ambulatory Visit: Payer: Self-pay

## 2022-03-09 ENCOUNTER — Other Ambulatory Visit: Payer: Self-pay

## 2022-03-30 ENCOUNTER — Encounter: Payer: Self-pay | Admitting: Nurse Practitioner

## 2022-04-01 ENCOUNTER — Other Ambulatory Visit: Payer: Self-pay | Admitting: Nurse Practitioner

## 2022-04-01 DIAGNOSIS — I10 Essential (primary) hypertension: Secondary | ICD-10-CM

## 2022-04-01 MED ORDER — ENALAPRIL MALEATE 20 MG PO TABS
40.0000 mg | ORAL_TABLET | Freq: Every day | ORAL | 1 refills | Status: DC
  Filled 2022-04-01: qty 180, 90d supply, fill #0
  Filled 2022-06-30: qty 180, 90d supply, fill #1
  Filled 2022-07-05: qty 116, 58d supply, fill #1
  Filled 2022-07-05: qty 180, 90d supply, fill #1
  Filled 2022-07-06: qty 64, 32d supply, fill #1

## 2022-04-02 ENCOUNTER — Other Ambulatory Visit: Payer: Self-pay

## 2022-04-03 ENCOUNTER — Other Ambulatory Visit: Payer: Self-pay

## 2022-04-05 ENCOUNTER — Other Ambulatory Visit: Payer: Self-pay

## 2022-04-09 ENCOUNTER — Other Ambulatory Visit: Payer: Self-pay

## 2022-04-23 ENCOUNTER — Other Ambulatory Visit: Payer: Self-pay

## 2022-05-14 ENCOUNTER — Other Ambulatory Visit: Payer: Self-pay | Admitting: Nurse Practitioner

## 2022-05-14 DIAGNOSIS — Z8719 Personal history of other diseases of the digestive system: Secondary | ICD-10-CM

## 2022-05-15 ENCOUNTER — Other Ambulatory Visit: Payer: Self-pay

## 2022-05-16 ENCOUNTER — Other Ambulatory Visit: Payer: Self-pay

## 2022-05-16 MED ORDER — OMEPRAZOLE 20 MG PO CPDR
DELAYED_RELEASE_CAPSULE | Freq: Two times a day (BID) | ORAL | 0 refills | Status: DC
  Filled 2022-05-16: qty 60, 30d supply, fill #0

## 2022-05-22 ENCOUNTER — Other Ambulatory Visit: Payer: Self-pay

## 2022-05-28 ENCOUNTER — Other Ambulatory Visit: Payer: Self-pay

## 2022-05-30 ENCOUNTER — Other Ambulatory Visit: Payer: Self-pay

## 2022-06-01 ENCOUNTER — Other Ambulatory Visit: Payer: Self-pay

## 2022-06-01 ENCOUNTER — Ambulatory Visit: Payer: Self-pay | Attending: Nurse Practitioner | Admitting: Nurse Practitioner

## 2022-06-01 ENCOUNTER — Encounter: Payer: Self-pay | Admitting: Nurse Practitioner

## 2022-06-01 VITALS — BP 203/93 | HR 110 | Temp 98.1°F | Wt 186.6 lb

## 2022-06-01 DIAGNOSIS — F4310 Post-traumatic stress disorder, unspecified: Secondary | ICD-10-CM

## 2022-06-01 DIAGNOSIS — Z23 Encounter for immunization: Secondary | ICD-10-CM

## 2022-06-01 MED ORDER — HYDROXYZINE HCL 25 MG PO TABS: 25.0000 mg | ORAL_TABLET | Freq: Three times a day (TID) | ORAL | 1 refills | Status: DC | PRN

## 2022-06-01 NOTE — Patient Instructions (Signed)
Can take 1-2 tablets three times per day as needed for anxiety
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
## 2022-06-04 ENCOUNTER — Other Ambulatory Visit: Payer: Self-pay

## 2022-06-08 ENCOUNTER — Other Ambulatory Visit: Payer: Self-pay

## 2022-06-30 ENCOUNTER — Other Ambulatory Visit (HOSPITAL_COMMUNITY): Payer: Self-pay

## 2022-06-30 ENCOUNTER — Other Ambulatory Visit: Payer: Self-pay | Admitting: Family Medicine

## 2022-06-30 DIAGNOSIS — Z8719 Personal history of other diseases of the digestive system: Secondary | ICD-10-CM

## 2022-07-02 ENCOUNTER — Other Ambulatory Visit (HOSPITAL_COMMUNITY): Payer: Self-pay

## 2022-07-02 ENCOUNTER — Other Ambulatory Visit: Payer: Self-pay

## 2022-07-02 MED ORDER — OMEPRAZOLE 20 MG PO CPDR
20.0000 mg | DELAYED_RELEASE_CAPSULE | Freq: Two times a day (BID) | ORAL | 0 refills | Status: DC
  Filled 2022-07-02 (×2): qty 180, 90d supply, fill #0

## 2022-07-05 ENCOUNTER — Other Ambulatory Visit: Payer: Self-pay

## 2022-07-05 ENCOUNTER — Other Ambulatory Visit (HOSPITAL_COMMUNITY): Payer: Self-pay

## 2022-07-06 ENCOUNTER — Other Ambulatory Visit: Payer: Self-pay

## 2022-07-09 ENCOUNTER — Other Ambulatory Visit: Payer: Self-pay

## 2022-07-17 ENCOUNTER — Other Ambulatory Visit: Payer: Self-pay

## 2022-07-23 ENCOUNTER — Other Ambulatory Visit: Payer: Self-pay

## 2022-07-24 ENCOUNTER — Other Ambulatory Visit: Payer: Self-pay

## 2022-08-07 ENCOUNTER — Other Ambulatory Visit (HOSPITAL_COMMUNITY): Payer: Self-pay

## 2022-08-13 ENCOUNTER — Other Ambulatory Visit: Payer: Self-pay

## 2022-08-27 ENCOUNTER — Encounter: Payer: Self-pay | Admitting: Nurse Practitioner

## 2022-08-27 ENCOUNTER — Other Ambulatory Visit: Payer: Self-pay

## 2022-08-28 ENCOUNTER — Other Ambulatory Visit: Payer: Self-pay | Admitting: Pharmacist

## 2022-08-28 ENCOUNTER — Other Ambulatory Visit: Payer: Self-pay

## 2022-08-28 DIAGNOSIS — E119 Type 2 diabetes mellitus without complications: Secondary | ICD-10-CM

## 2022-08-28 MED ORDER — GLIMEPIRIDE 4 MG PO TABS
4.0000 mg | ORAL_TABLET | Freq: Every day | ORAL | 0 refills | Status: DC
  Filled 2022-08-28: qty 90, 90d supply, fill #0

## 2022-09-07 ENCOUNTER — Other Ambulatory Visit: Payer: Self-pay | Admitting: Nurse Practitioner

## 2022-09-07 ENCOUNTER — Other Ambulatory Visit: Payer: Self-pay

## 2022-09-07 ENCOUNTER — Ambulatory Visit: Payer: Self-pay | Admitting: Nurse Practitioner

## 2022-09-07 ENCOUNTER — Encounter: Payer: Self-pay | Admitting: Nurse Practitioner

## 2022-09-07 DIAGNOSIS — E119 Type 2 diabetes mellitus without complications: Secondary | ICD-10-CM

## 2022-09-07 MED ORDER — SITAGLIPTIN PHOS-METFORMIN HCL 50-1000 MG PO TABS
1.0000 | ORAL_TABLET | Freq: Two times a day (BID) | ORAL | 6 refills | Status: DC
  Filled 2022-09-07: qty 60, 30d supply, fill #0
  Filled 2022-09-11: qty 120, 60d supply, fill #0
  Filled 2022-11-07: qty 120, 60d supply, fill #1
  Filled 2022-11-07: qty 180, 90d supply, fill #1
  Filled 2023-02-08: qty 120, 60d supply, fill #2

## 2022-09-07 MED ORDER — SITAGLIPTIN PHOS-METFORMIN HCL 50-1000 MG PO TABS
1.0000 | ORAL_TABLET | Freq: Two times a day (BID) | ORAL | 6 refills | Status: DC
  Filled 2022-09-07: qty 30, 15d supply, fill #0

## 2022-09-11 ENCOUNTER — Other Ambulatory Visit: Payer: Self-pay

## 2022-10-08 ENCOUNTER — Other Ambulatory Visit: Payer: Self-pay

## 2022-10-20 ENCOUNTER — Encounter: Payer: Self-pay | Admitting: Nurse Practitioner

## 2022-10-21 ENCOUNTER — Other Ambulatory Visit: Payer: Self-pay

## 2022-10-21 ENCOUNTER — Other Ambulatory Visit: Payer: Self-pay | Admitting: Nurse Practitioner

## 2022-10-21 DIAGNOSIS — I10 Essential (primary) hypertension: Secondary | ICD-10-CM

## 2022-10-21 MED ORDER — AMLODIPINE BESYLATE 10 MG PO TABS
10.0000 mg | ORAL_TABLET | Freq: Every day | ORAL | 1 refills | Status: DC
  Filled 2022-10-21: qty 90, 90d supply, fill #0
  Filled 2023-01-15: qty 90, 90d supply, fill #1

## 2022-10-21 MED ORDER — ENALAPRIL MALEATE 20 MG PO TABS
40.0000 mg | ORAL_TABLET | Freq: Every day | ORAL | 1 refills | Status: DC
  Filled 2022-10-21: qty 180, 90d supply, fill #0

## 2022-10-22 ENCOUNTER — Other Ambulatory Visit: Payer: Self-pay

## 2022-10-23 ENCOUNTER — Other Ambulatory Visit: Payer: Self-pay

## 2022-11-07 ENCOUNTER — Other Ambulatory Visit: Payer: Self-pay

## 2022-11-08 ENCOUNTER — Other Ambulatory Visit: Payer: Self-pay

## 2022-11-09 ENCOUNTER — Other Ambulatory Visit: Payer: Self-pay

## 2022-11-13 ENCOUNTER — Ambulatory Visit (HOSPITAL_COMMUNITY)
Admission: RE | Admit: 2022-11-13 | Discharge: 2022-11-13 | Disposition: A | Discharge status: Home / Self Care | Payer: Self-pay | Source: Ambulatory Visit | Attending: Family Medicine | Admitting: Family Medicine

## 2022-11-13 ENCOUNTER — Encounter (HOSPITAL_COMMUNITY): Payer: Self-pay

## 2022-11-13 VITALS — BP 179/114 | HR 101 | Temp 98.0°F | Resp 18

## 2022-11-13 DIAGNOSIS — H6122 Impacted cerumen, left ear: Secondary | ICD-10-CM

## 2022-11-13 MED ORDER — CARBAMIDE PEROXIDE 6.5 % OT SOLN
OTIC | Status: AC
  Filled 2022-11-13: qty 15

## 2022-11-13 NOTE — ED Provider Notes (Addendum)
[facility]    CSN: [id number] Arrival date & time: 11/13/22  1209      History   Chief Complaint Chief Complaint  Patient presents with   Ear Fullness    HPI Kevin [name] is a 57 y.o. male.   Patient is here for left ear feeling clogged x 3 days.  Right ear is fine.  No runny nose, congestion or drainage.  He has this issue from time to time and the ear needs to be irrigated.  Slightly painful.   His bp is elevated today.  He did take his bp medication.  He states it is usually elevated due to coming here.        Past Medical History:  Diagnosis Date   COVID 04/20/2020   Diabetes mellitus without complication (HCC)    GERD (gastroesophageal reflux disease)    Hypertension    MRSA (methicillin resistant Staphylococcus aureus)     Patient Active Problem List   Diagnosis Date Noted   PVD (peripheral vascular disease) ([place]) 02/26/2022   Arthritis of finger 12/01/2020   Primary osteoarthritis of right knee 12/01/2020   Trigger finger, right middle finger 10/05/2020   Toe pain, chronic, left 10/07/2018   Hyperlipidemia associated with type 2 diabetes mellitus ([place]) 09/19/2018   Hyperlipidemia 03/18/2018   Dyspnea on exertion 03/18/2018   Chest pain 03/18/2018   Diabetes mellitus without complication ([place]) 0000000   Essential hypertension 11/11/2017    Past Surgical History:  Procedure Laterality Date   BACK SURGERY     LAPAROSCOPIC GASTRIC BANDING     TRIGGER FINGER RELEASE Right 10/05/2020   Procedure: RELEASE TRIGGER FINGER RIGHT LONG FINGER;  Surgeon: Leandrew Koyanagi, MD;  Location: [facility];  Service: Orthopedics;  Laterality: Right;       Home Medications    Prior to Admission medications   Medication Sig Start Date End Date Taking? Authorizing Provider  amLODipine (NORVASC) 10 MG tablet Take 1 tablet (10 mg total) by mouth daily. 10/21/22  Yes [name], NP  atorvastatin (LIPITOR) 40 MG tablet Take 1 tablet (40 mg  total) by mouth daily. Please fill as a 90 day supply 02/26/22  Yes [name], NP  Blood Glucose Monitoring Suppl (TRUE METRIX METER) w/Device KIT Use as instructed 10/23/17  Yes [name], NP  enalapril (VASOTEC) 20 MG tablet Take 2 tablets (40 mg total) by mouth daily. Please fill as a 90 day supply 10/21/22  Yes [name], NP  glimepiride (AMARYL) 4 MG tablet Take 1 tablet (4 mg total) by mouth daily with breakfast. 08/28/22  Yes Newlin, Enobong, MD  glucose blood (TRUE METRIX BLOOD GLUCOSE TEST) test strip Use as instructed. Check blood glucose level by fingerstick twice per day. 03/07/21  Yes [name], NP  hydrOXYzine (ATARAX) 25 MG tablet Take 1 tablet (25 mg total) by mouth 3 (three) times daily as needed. 06/01/22  Yes [name], NP  omeprazole (PRILOSEC) 20 MG capsule Take 1 capsule (20 mg total) by mouth 2 (two) times daily before a meal. 07/02/22  Yes Charlott Rakes, MD  sitaGLIPtin-metformin (JANUMET) 50-1000 MG tablet Take 1 tablet by mouth 2 (two) times daily with a meal. 09/07/22  Yes [name], NP  TRUEplus Lancets 28G MISC Use as instructed. Check blood glucose level by fingerstick twice per day. 03/07/21  Yes [name], NP    Family History Family History  Problem Relation Age of Onset  Heart disease Mother    Hypertension Mother    Diabetes Sister    Hypertension Sister    Stroke Brother    Diabetes Brother    Heart disease Brother    Heart attack Brother    Hypertension Brother    Hypertension Father     Social History Social History   Tobacco Use   Smoking status: Former    Types: Cigarettes    Quit date: 2000    Years since quitting: 24.1   Smokeless tobacco: Never  Vaping Use   Vaping Use: Never used  Substance Use Topics   Alcohol use: No   Drug use: No     Allergies   Tramadol   Review of Systems Review of Systems  Constitutional: Negative.   HENT:  Positive for hearing loss. Negative for  congestion and rhinorrhea.   Cardiovascular: Negative.   Gastrointestinal: Negative.   Genitourinary: Negative.   Musculoskeletal: Negative.      Physical Exam Triage Vital Signs ED Triage Vitals  Enc Vitals Group     BP 11/13/22 1244 (!) 179/114     Pulse Rate 11/13/22 1244 (!) 101     Resp 11/13/22 1244 18     Temp 11/13/22 1244 98 F (36.7 C)     Temp Source 11/13/22 1244 Oral     SpO2 11/13/22 1244 96 %     Weight --      Height --      Head Circumference --      Peak Flow --      Pain Score 11/13/22 1243 0     Pain Loc --      Pain Edu? --      Excl. in [place]? --    No data found.  Updated Vital Signs BP (!) 179/114 (BP Location: Right Arm) Comment: talking  Pulse (!) 101   Temp 98 F (36.7 C) (Oral)   Resp 18   SpO2 96%   Visual Acuity Right Eye Distance:   Left Eye Distance:   Bilateral Distance:    Right Eye Near:   Left Eye Near:    Bilateral Near:     Physical Exam Constitutional:      Appearance: Normal appearance.  HENT:     Right Ear: Hearing, tympanic membrane and ear canal normal.     Left Ear: There is impacted cerumen.     Nose: Nose normal.  Cardiovascular:     Rate and Rhythm: Normal rate.  Pulmonary:     Effort: Pulmonary effort is normal.  Musculoskeletal:     Cervical back: Normal range of motion.  Neurological:     General: No focal deficit present.     Mental Status: He is alert.  Psychiatric:        Mood and Affect: Mood normal.      UC Treatments / Results  Labs (all labs ordered are listed, but only abnormal results are displayed) Labs Reviewed - No data to display  EKG   Radiology No results found.  Procedures Procedures (including critical care time)  Medications Ordered in UC Medications - No data to display  Initial Impression / Assessment and Plan / UC Course  I have reviewed the triage vital signs and the nursing notes.  Pertinent labs & imaging results that were available during my care of the  patient were reviewed by me and considered in my medical decision making (see chart for details).   The left ear was irrigated after  peroxide soak.  Unable to remove wax in office with multiple attempts at irrigation.   When attempting to d/c him patient was upset that not more was done to remove the wax.  After discussion I attempted to remove the wax using a currette.  A small amount of dark wax was removed, but still present is a large dark piece of wax that I am unable to remove.   Discussed with patient to use ear wax drops at home, and make an appointment with ENT for further evaluation.,   Final Clinical Impressions(s) / UC Diagnoses   Final diagnoses:  Impacted cerumen of left ear     Discharge Instructions      You were seen today for cerumen impaction.  We were unable to clean this out as the wax is too hard.  I recommend you use over the counter ear wax softener x 1 week.  You may return to attempt to clean the ear again at that time.  You may also make an appointment with an ENT so they may attempt to clean this in office.  You may call [phone] to make that appointment if needed.     ED Prescriptions   None    PDMP not reviewed this encounter.   Rondel Oh, MD 11/13/22 1337    Rondel Oh, MD 11/13/22 1407

## 2022-11-13 NOTE — Discharge Instructions (Addendum)
You were seen today for cerumen impaction.  We were unable to clean this out as the wax is too hard.  I recommend you use over the counter ear wax softener x 1 week.  You may return to attempt to clean the ear again at that time.  You may also make an appointment with an ENT so they may attempt to clean this in office.  You may call [phone] to make that appointment if needed.

## 2022-11-13 NOTE — ED Triage Notes (Signed)
Pt states his left ear is full and has been x 3 days. He usually can pull on his ear and it pops and unplugs.

## 2022-11-19 ENCOUNTER — Ambulatory Visit: Payer: Self-pay | Attending: Nurse Practitioner | Admitting: Nurse Practitioner

## 2022-11-19 ENCOUNTER — Other Ambulatory Visit: Payer: Self-pay

## 2022-11-19 ENCOUNTER — Encounter: Payer: Self-pay | Admitting: Nurse Practitioner

## 2022-11-19 VITALS — BP 163/96 | HR 103 | Ht 67.0 in | Wt 194.4 lb

## 2022-11-19 DIAGNOSIS — Z1211 Encounter for screening for malignant neoplasm of colon: Secondary | ICD-10-CM

## 2022-11-19 DIAGNOSIS — I10 Essential (primary) hypertension: Secondary | ICD-10-CM

## 2022-11-19 DIAGNOSIS — E119 Type 2 diabetes mellitus without complications: Secondary | ICD-10-CM

## 2022-11-19 LAB — POCT GLYCOSYLATED HEMOGLOBIN (HGB A1C): HbA1c, POC (controlled diabetic range): 6.4 % (ref 0.0–7.0)

## 2022-11-19 MED ORDER — VALSARTAN 80 MG PO TABS
80.0000 mg | ORAL_TABLET | Freq: Every day | ORAL | 3 refills | Status: DC
  Filled 2022-11-19: qty 90, 90d supply, fill #0
  Filled 2023-03-01: qty 90, 90d supply, fill #1
  Filled 2023-05-28: qty 90, 90d supply, fill #2

## 2022-11-19 MED ORDER — GLIMEPIRIDE 4 MG PO TABS
4.0000 mg | ORAL_TABLET | Freq: Every day | ORAL | 1 refills | Status: DC
  Filled 2022-11-19: qty 90, 90d supply, fill #0
  Filled 2023-03-01: qty 90, 90d supply, fill #1

## 2022-11-19 NOTE — Progress Notes (Signed)
Assessment & Plan:  Kevin [name] was seen today for hypertension and diabetes.  Diagnoses and all orders for this visit:  Primary hypertension Continue amlodipine. Follow up in 4 weeks for BP check -     valsartan (DIOVAN) 80 MG tablet; Take 1 tablet (80 mg total) by mouth daily. Continue all antihypertensives as prescribed.  Reminded to bring in blood pressure log for follow  up appointment.  RECOMMENDATIONS: DASH/Mediterranean Diets are healthier choices for HTN.    Colon cancer screening -     Fecal occult blood, imunochemical  Diabetes mellitus without complication (HCC) -     Microalbumin / creatinine urine ratio -     CMP14+EGFR -     POCT glycosylated hemoglobin (Hb A1C) -     glimepiride (AMARYL) 4 MG tablet; Take 1 tablet (4 mg total) by mouth daily with breakfast. Continue blood sugar control as discussed in office today, low carbohydrate diet, and regular physical exercise as tolerated, 150 minutes per week (30 min each day, 5 days per week, or 50 min 3 days per week). Keep blood sugar logs with fasting goal of 90-130 mg/dl, post prandial (after you eat) less than 180.  For Hypoglycemia: BS <60 and Hyperglycemia BS >400; contact the clinic ASAP. Annual eye exams and foot exams are recommended.       Patient has been counseled on age-appropriate routine health concerns for screening and prevention. These are reviewed and up-to-date. Referrals have been placed accordingly. Immunizations are up-to-date or declined.    Subjective:   Chief Complaint  Patient presents with   Hypertension   Diabetes   Hypertension Pertinent negatives include no blurred vision, chest pain, headaches, malaise/fatigue, palpitations or shortness of breath.  Diabetes Pertinent negatives for hypoglycemia include no dizziness, headaches or seizures. Pertinent negatives for diabetes include no blurred vision, no chest pain and no weight loss.   Kevin [name] 57 y.o. male presents to office today for  follow up to DM and HTN  Unfortunately he endorses a recent unpleasant experience he had at the urgent care on 11-13-2022 regarding wax removal 2/2 cerumen impaction. He continues to endorse plugged ear sensation, decreased hearing and pain despite their attempts. He declines our office attempting to remove the impaction as we have similar equipment here in our office. He can not afford to see an ENT as he is uninsured. States he has only had 1 successful ear wax removal and this was several years ago in the emergency room. He has a chronic history of cerumen impaction of the left ear.    HTN Blood pressure is elevated. Does not endorse chest pain or shortness of breath. Also elevated based on home readings as well. Will switch 40 mg enalapril to valsartan 80 mg daily. He will continue on amlodipine 10 mg daily. He does monitor his blood pressure at home.  BP Readings from Last 3 Encounters:  11/19/22 (!) 166/95  11/13/22 (!) 179/114  06/01/22 (!) 203/93     Review of Systems  Constitutional:  Negative for fever, malaise/fatigue and weight loss.  HENT:  Positive for ear pain and hearing loss. Negative for nosebleeds.   Eyes: Negative.  Negative for blurred vision, double vision and photophobia.  Respiratory: Negative.  Negative for cough and shortness of breath.   Cardiovascular: Negative.  Negative for chest pain, palpitations and leg swelling.  Gastrointestinal: Negative.  Negative for heartburn, nausea and vomiting.  Musculoskeletal: Negative.  Negative for myalgias.  Neurological: Negative.  Negative for dizziness, focal  weakness, seizures and headaches.  Psychiatric/Behavioral: Negative.  Negative for suicidal ideas.     Past Medical History:  Diagnosis Date   COVID 04/20/2020   Diabetes mellitus without complication (HCC)    GERD (gastroesophageal reflux disease)    Hypertension    MRSA (methicillin resistant Staphylococcus aureus)     Past Surgical History:  Procedure  Laterality Date   BACK SURGERY     LAPAROSCOPIC GASTRIC BANDING     TRIGGER FINGER RELEASE Right 10/05/2020   Procedure: RELEASE TRIGGER FINGER RIGHT LONG FINGER;  Surgeon: Leandrew Koyanagi, MD;  Location: [facility];  Service: Orthopedics;  Laterality: Right;    Family History  Problem Relation Age of Onset   Heart disease Mother    Hypertension Mother    Diabetes Sister    Hypertension Sister    Stroke Brother    Diabetes Brother    Heart disease Brother    Heart attack Brother    Hypertension Brother    Hypertension Father     Social History Reviewed with no changes to be made today.   Outpatient Medications Prior to Visit  Medication Sig Dispense Refill   amLODipine (NORVASC) 10 MG tablet Take 1 tablet (10 mg total) by mouth daily. 90 tablet 1   atorvastatin (LIPITOR) 40 MG tablet Take 1 tablet (40 mg total) by mouth daily. Please fill as a 90 day supply 90 tablet 3   Blood Glucose Monitoring Suppl (TRUE METRIX METER) w/Device KIT Use as instructed 1 kit 0   glucose blood (TRUE METRIX BLOOD GLUCOSE TEST) test strip Use as instructed. Check blood glucose level by fingerstick twice per day. 200 each 12   omeprazole (PRILOSEC) 20 MG capsule Take 1 capsule (20 mg total) by mouth 2 (two) times daily before a meal. 180 capsule 0   sitaGLIPtin-metformin (JANUMET) 50-1000 MG tablet Take 1 tablet by mouth 2 (two) times daily with a meal. 60 tablet 6   TRUEplus Lancets 28G MISC Use as instructed. Check blood glucose level by fingerstick twice per day. 200 each 6   enalapril (VASOTEC) 20 MG tablet Take 2 tablets (40 mg total) by mouth daily. Please fill as a 90 day supply 180 tablet 1   glimepiride (AMARYL) 4 MG tablet Take 1 tablet (4 mg total) by mouth daily with breakfast. 90 tablet 0   hydrOXYzine (ATARAX) 25 MG tablet Take 1 tablet (25 mg total) by mouth 3 (three) times daily as needed. (Patient not taking: Reported on 11/19/2022) 90 tablet 1   No facility-administered  medications prior to visit.    Allergies  Allergen Reactions   Tramadol     seizure       Objective:    BP (!) 166/95   Pulse (!) 103   Ht '5\' 7"'$  (1.702 m)   Wt 194 lb 6.4 oz (88.2 kg)   SpO2 98%   BMI 30.45 kg/m  Wt Readings from Last 3 Encounters:  11/19/22 194 lb 6.4 oz (88.2 kg)  06/01/22 186 lb 9.6 oz (84.6 kg)  02/26/22 192 lb (87.1 kg)    Physical Exam Vitals and nursing note reviewed.  Constitutional:      Appearance: He is well-developed.  HENT:     Head: Normocephalic and atraumatic.     Left Ear: Ear canal normal. There is impacted cerumen.  Cardiovascular:     Rate and Rhythm: Regular rhythm. Tachycardia present.     Heart sounds: Normal heart sounds. No murmur heard.  No friction rub. No gallop.  Pulmonary:     Effort: Pulmonary effort is normal. No tachypnea or respiratory distress.     Breath sounds: Normal breath sounds. No decreased breath sounds, wheezing, rhonchi or rales.  Chest:     Chest wall: No tenderness.  Abdominal:     General: Bowel sounds are normal.     Palpations: Abdomen is soft.  Musculoskeletal:        General: Normal range of motion.     Cervical back: Normal range of motion.  Skin:    General: Skin is warm and dry.  Neurological:     Mental Status: He is alert and oriented to person, place, and time.     Coordination: Coordination normal.  Psychiatric:        Behavior: Behavior normal. Behavior is cooperative.        Thought Content: Thought content normal.        Judgment: Judgment normal.          Patient has been counseled extensively about nutrition and exercise as well as the importance of adherence with medications and regular follow-up. The patient was given clear instructions to go to ER or return to medical center if symptoms don't improve, worsen or new problems develop. The patient verbalized understanding.   Follow-up: Return in about 4 weeks (around 12/17/2022) for mychart virtual HTN.   [name],  FNP-BC [facility] [place]   11/19/2022, 9:23 AM

## 2022-11-20 LAB — CMP14+EGFR
ALT: 24 IU/L (ref 0–44)
AST: 21 IU/L (ref 0–40)
Albumin/Globulin Ratio: 1.8 (ref 1.2–2.2)
Albumin: 4.9 g/dL (ref 3.8–4.9)
Alkaline Phosphatase: 89 IU/L (ref 44–121)
BUN/Creatinine Ratio: 9 (ref 9–20)
BUN: 9 mg/dL (ref 6–24)
Bilirubin Total: 0.4 mg/dL (ref 0.0–1.2)
CO2: 20 mmol/L (ref 20–29)
Calcium: 9.6 mg/dL (ref 8.7–10.2)
Chloride: 102 mmol/L (ref 96–106)
Creatinine, Ser: 0.97 mg/dL (ref 0.76–1.27)
Globulin, Total: 2.7 g/dL (ref 1.5–4.5)
Glucose: 145 mg/dL — ABNORMAL HIGH (ref 70–99)
Potassium: 4.8 mmol/L (ref 3.5–5.2)
Sodium: 138 mmol/L (ref 134–144)
Total Protein: 7.6 g/dL (ref 6.0–8.5)
eGFR: 91 mL/min/{1.73_m2} (ref >59)

## 2022-11-20 LAB — MICROALBUMIN / CREATININE URINE RATIO
Creatinine, Urine: 125.2 mg/dL
Microalb/Creat Ratio: 44 mg/g creat — ABNORMAL HIGH (ref 0–29)
Microalbumin, Urine: 54.7 ug/mL

## 2022-11-23 ENCOUNTER — Other Ambulatory Visit: Payer: Self-pay

## 2022-12-14 ENCOUNTER — Other Ambulatory Visit: Payer: Self-pay

## 2022-12-17 ENCOUNTER — Other Ambulatory Visit: Payer: Self-pay

## 2022-12-18 ENCOUNTER — Other Ambulatory Visit: Payer: Self-pay

## 2022-12-24 ENCOUNTER — Encounter: Payer: Self-pay | Admitting: Nurse Practitioner

## 2022-12-24 ENCOUNTER — Ambulatory Visit: Payer: Self-pay | Attending: Nurse Practitioner | Admitting: Nurse Practitioner

## 2022-12-24 DIAGNOSIS — R030 Elevated blood-pressure reading, without diagnosis of hypertension: Secondary | ICD-10-CM

## 2022-12-24 DIAGNOSIS — E119 Type 2 diabetes mellitus without complications: Secondary | ICD-10-CM

## 2022-12-24 NOTE — Progress Notes (Signed)
Virtual Visit Note  I discussed the limitations, risks, security and privacy concerns of performing an evaluation and management service by video and the availability of in person appointments. I also discussed with the patient that there may be a patient responsible charge related to this service. The patient expressed understanding and agreed to proceed.    I connected with Kevin [name] on 12/24/22  at   9:50 AM EDT  EDT by VIDEO and verified that I am speaking with the correct person using two identifiers.   Location of Patient: Private Residence   Location of Provider: [facility]    Persons participating in VIRTUAL visit: [name] FNP-BC Isaia Gehres    History of Present Illness: VIRTUAL visit for: HTN  HTN Diastolic Blood pressure today continues elevated (118/96) Systolic has improved significantly. Microalbumin was also elevated. He is taking valsartan 80 mg daily and amlodipine 10 mg daily. He does report stopping valsartan for a short time due to headaches and resuming his enalapril however he has since restarted valsartan and headaches are minimal at this time. Does not endorse chest pain or shortness of breath. He is not smoking at this time.  BP Readings from Last 3 Encounters:  11/19/22 (!) 163/96  11/13/22 (!) 179/114  06/01/22 (!) 203/93    Wt Readings from Last 3 Encounters:  11/19/22 194 lb 6.4 oz (88.2 kg)  06/01/22 186 lb 9.6 oz (84.6 kg)  02/26/22 192 lb (87.1 kg)    Past Medical History:  Diagnosis Date   COVID 04/20/2020   Diabetes mellitus without complication    GERD (gastroesophageal reflux disease)    Hypertension    MRSA (methicillin resistant Staphylococcus aureus)     Past Surgical History:  Procedure Laterality Date   BACK SURGERY     LAPAROSCOPIC GASTRIC BANDING     TRIGGER FINGER RELEASE Right 10/05/2020   Procedure: RELEASE TRIGGER FINGER RIGHT LONG FINGER;  Surgeon: Tarry Kos, MD;  Location: [facility];  Service: Orthopedics;  Laterality: Right;    Family History  Problem Relation Age of Onset   Heart disease Mother    Hypertension Mother    Diabetes Sister    Hypertension Sister    Stroke Brother    Diabetes Brother    Heart disease Brother    Heart attack Brother    Hypertension Brother    Hypertension Father     Social History   Socioeconomic History   Marital status: Divorced    Spouse name: Not on file   Number of children: Not on file   Years of education: Not on file   Highest education level: Not on file  Occupational History   Not on file  Tobacco Use   Smoking status: Former    Years: 15    Types: Cigarettes    Quit date: 2000    Years since quitting: 24.3   Smokeless tobacco: Never  Vaping Use   Vaping Use: Never used  Substance and Sexual Activity   Alcohol use: No   Drug use: No   Sexual activity: Yes  Other Topics Concern   Not on file  Social History Narrative   Not on file   Social Determinants of Health   Financial Resource Strain: Not on file  Food Insecurity: Not on file  Transportation Needs: Not on file  Physical Activity: Not on file  Stress: Not on file  Social Connections: Not on file     Observations/Objective:  Awake, alert and oriented x 3   Review of Systems  Constitutional:  Negative for fever, malaise/fatigue and weight loss.  HENT: Negative.  Negative for nosebleeds.   Eyes: Negative.  Negative for blurred vision, double vision and photophobia.  Respiratory: Negative.  Negative for cough and shortness of breath.   Cardiovascular: Negative.  Negative for chest pain, palpitations and leg swelling.  Gastrointestinal: Negative.  Negative for heartburn, nausea and vomiting.  Musculoskeletal: Negative.  Negative for myalgias.  Neurological:  Positive for headaches. Negative for dizziness, focal weakness and seizures.  Psychiatric/Behavioral: Negative.  Negative for suicidal ideas.     Assessment and  Plan: Diagnoses and all orders for this visit:  Diastolic blood pressure 90 mm Hg or higher -     Aldosterone + renin activity w/ ratio; Future -     Thyroid Panel With TSH; Future May need referral to cardiology if DBP continues persistently elevated  Diabetes mellitus without complication -     Hemoglobin A1c; Future     Follow Up Instructions Return in about 3 months (around 03/25/2023).     I discussed the assessment and treatment plan with the patient. The patient was provided an opportunity to ask questions and all were answered. The patient agreed with the plan and demonstrated an understanding of the instructions.   The patient was advised to call back or seek an in-person evaluation if the symptoms worsen or if the condition fails to improve as anticipated.  I provided 11 minutes of face-to-face time during this encounter including median intraservice time, reviewing previous notes, labs, imaging, medications and explaining diagnosis and management.  Claiborne Rigg, FNP-BC

## 2023-01-02 ENCOUNTER — Other Ambulatory Visit: Payer: Self-pay

## 2023-01-04 ENCOUNTER — Other Ambulatory Visit: Payer: Self-pay

## 2023-01-15 ENCOUNTER — Other Ambulatory Visit: Payer: Self-pay

## 2023-01-15 ENCOUNTER — Other Ambulatory Visit: Payer: Self-pay | Admitting: Family Medicine

## 2023-01-15 DIAGNOSIS — Z8719 Personal history of other diseases of the digestive system: Secondary | ICD-10-CM

## 2023-01-15 MED ORDER — OMEPRAZOLE 20 MG PO CPDR
20.0000 mg | DELAYED_RELEASE_CAPSULE | Freq: Two times a day (BID) | ORAL | 0 refills | Status: DC
  Filled 2023-01-15: qty 180, 90d supply, fill #0

## 2023-01-15 NOTE — Telephone Encounter (Signed)
Requested Prescriptions  Pending Prescriptions Disp Refills   omeprazole (PRILOSEC) 20 MG capsule 180 capsule 0    Sig: Take 1 capsule (20 mg total) by mouth 2 (two) times daily before a meal.     Gastroenterology: Proton Pump Inhibitors Passed - 01/15/2023 12:05 PM      Passed - Valid encounter within last 12 months    Recent Outpatient Visits           3 weeks ago Diastolic blood pressure 90 mm Hg or higher   [place] [facility], Shea Stakes, NP   1 month ago Primary hypertension   [facility] [place], Shea Stakes, NP   7 months ago PTSD (post-traumatic stress disorder)   [facility] [place], Shea Stakes, NP   10 months ago Essential hypertension   [facility] [facility] [place], Shea Stakes, NP   1 year ago Essential hypertension   [facility] Claiborne Rigg, NP       Future Appointments             In 1 month Claiborne Rigg, NP [facility]

## 2023-01-16 ENCOUNTER — Other Ambulatory Visit: Payer: Self-pay

## 2023-02-08 ENCOUNTER — Other Ambulatory Visit: Payer: Self-pay

## 2023-02-11 ENCOUNTER — Other Ambulatory Visit: Payer: Self-pay

## 2023-03-01 ENCOUNTER — Other Ambulatory Visit: Payer: Self-pay

## 2023-03-04 ENCOUNTER — Other Ambulatory Visit: Payer: Self-pay

## 2023-03-06 ENCOUNTER — Ambulatory Visit: Payer: Self-pay | Admitting: Nurse Practitioner

## 2023-03-18 ENCOUNTER — Other Ambulatory Visit: Payer: Self-pay | Admitting: Nurse Practitioner

## 2023-03-18 ENCOUNTER — Other Ambulatory Visit: Payer: Self-pay

## 2023-03-18 DIAGNOSIS — E1169 Type 2 diabetes mellitus with other specified complication: Secondary | ICD-10-CM

## 2023-03-18 MED ORDER — ATORVASTATIN CALCIUM 40 MG PO TABS
40.0000 mg | ORAL_TABLET | Freq: Every day | ORAL | 1 refills | Status: DC
  Filled 2023-03-18: qty 90, 90d supply, fill #0
  Filled 2023-06-12: qty 90, 90d supply, fill #1

## 2023-03-19 ENCOUNTER — Other Ambulatory Visit: Payer: Self-pay

## 2023-04-05 ENCOUNTER — Other Ambulatory Visit: Payer: Self-pay | Admitting: Nurse Practitioner

## 2023-04-05 ENCOUNTER — Other Ambulatory Visit: Payer: Self-pay

## 2023-04-05 DIAGNOSIS — E119 Type 2 diabetes mellitus without complications: Secondary | ICD-10-CM

## 2023-04-05 MED ORDER — JANUMET 50-1000 MG PO TABS
1.0000 | ORAL_TABLET | Freq: Two times a day (BID) | ORAL | 0 refills | Status: DC
  Filled 2023-04-05: qty 60, 30d supply, fill #0

## 2023-04-05 NOTE — Telephone Encounter (Signed)
Patient has up coming appointment- needs labs also\ Requested Prescriptions  Pending Prescriptions Disp Refills   sitaGLIPtin-metformin (JANUMET) 50-1000 MG tablet 60 tablet 0    Sig: Take 1 tablet by mouth 2 (two) times daily with a meal.     Endocrinology:  Diabetes - Biguanide + DPP-4 Inhibitor Combos Failed - 04/05/2023  2:47 PM      Failed - B12 Level in normal range and within 720 days    No results found for: "VITAMINB12"       Failed - CBC within normal limits and completed in the last 12 months    WBC  Date Value Ref Range Status  02/26/2022 5.9 3.4 - 10.8 x10E3/uL Final  05/03/2017 6.4 4.0 - 10.5 K/uL Final   RBC  Date Value Ref Range Status  02/26/2022 4.64 4.14 - 5.80 x10E6/uL Final  05/03/2017 4.92 4.22 - 5.81 MIL/uL Final   Hemoglobin  Date Value Ref Range Status  02/26/2022 13.2 13.0 - 17.7 g/dL Final   Hematocrit  Date Value Ref Range Status  02/26/2022 39.9 37.5 - 51.0 % Final   MCHC  Date Value Ref Range Status  02/26/2022 33.1 31.5 - 35.7 g/dL Final  16/06/9603 54.0 30.0 - 36.0 g/dL Final   [facility]  Date Value Ref Range Status  02/26/2022 28.4 26.6 - 33.0 pg Final  05/03/2017 30.5 26.0 - 34.0 pg Final   MCV  Date Value Ref Range Status  02/26/2022 86 79 - 97 fL Final   No results found for: "PLTCOUNTKUC", "LABPLAT", "POCPLA" RDW  Date Value Ref Range Status  02/26/2022 13.9 11.6 - 15.4 % Final         Passed - HBA1C is between 0 and 7.9 and within 180 days    HbA1c, POC (controlled diabetic range)  Date Value Ref Range Status  11/19/2022 6.4 0.0 - 7.0 % Final         Passed - Cr in normal range and within 360 days    Creatinine, Ser  Date Value Ref Range Status  11/19/2022 0.97 0.76 - 1.27 mg/dL Final         Passed - eGFR in normal range and within 360 days    GFR calc Af Amer  Date Value Ref Range Status  05/27/2020 94 >59 mL/min/1.73 Final    Comment:    **[facility] currently reports eGFR in compliance with the current**    recommendations of the [employer]. [facility] will   update reporting as new guidelines are published from the NKF-ASN   Task force.    GFR, Estimated  Date Value Ref Range Status  10/03/2020 >60 >60 mL/min Final    Comment:    (NOTE) Calculated using the CKD-EPI Creatinine Equation (2021)    eGFR  Date Value Ref Range Status  11/19/2022 91 >59 mL/min/1.73 Final         Passed - Valid encounter within last 6 months    Recent Outpatient Visits           3 months ago Diastolic blood pressure 90 mm Hg or higher   [place] [facility] [place], Shea Stakes, NP   4 months ago Primary hypertension   [place] [facility] Claiborne Rigg, NP   10 months ago PTSD (post-traumatic stress disorder)   [place] [facility] [place], Shea Stakes, NP   1 year ago Essential hypertension   [facility]  Claiborne Rigg, NP   1 year ago Essential hypertension   [place] [facility] Claiborne Rigg, NP       Future Appointments             In 1 week Claiborne Rigg, NP [facility]

## 2023-04-13 ENCOUNTER — Other Ambulatory Visit: Payer: Self-pay | Admitting: Nurse Practitioner

## 2023-04-13 DIAGNOSIS — I10 Essential (primary) hypertension: Secondary | ICD-10-CM

## 2023-04-15 ENCOUNTER — Other Ambulatory Visit: Payer: Self-pay

## 2023-04-15 MED ORDER — AMLODIPINE BESYLATE 10 MG PO TABS
10.0000 mg | ORAL_TABLET | Freq: Every day | ORAL | 0 refills | Status: DC
  Filled 2023-04-15: qty 90, 90d supply, fill #0

## 2023-04-15 NOTE — Telephone Encounter (Signed)
Requested Prescriptions  Pending Prescriptions Disp Refills   amLODipine (NORVASC) 10 MG tablet 90 tablet 0    Sig: Take 1 tablet (10 mg total) by mouth daily.     Cardiovascular: Calcium Channel Blockers 2 Failed - 04/13/2023  9:09 AM      Failed - Last BP in normal range    BP Readings from Last 1 Encounters:  11/19/22 (!) 163/96         Passed - Last Heart Rate in normal range    Pulse Readings from Last 1 Encounters:  11/19/22 (!) 103         Passed - Valid encounter within last 6 months    Recent Outpatient Visits           3 months ago Diastolic blood pressure 90 mm Hg or higher   [facility] [place], Shea Stakes, NP   4 months ago Primary hypertension   [place] [facility], Shea Stakes, NP   10 months ago PTSD (post-traumatic stress disorder)   [place] [facility] [place], Shea Stakes, NP   1 year ago Essential hypertension   [place] [facility] [place], Shea Stakes, NP   1 year ago Essential hypertension   [place] [facility] [place], Shea Stakes, NP       Future Appointments             In 2 days Claiborne Rigg, NP [facility]

## 2023-04-17 ENCOUNTER — Encounter: Payer: Self-pay | Admitting: Nurse Practitioner

## 2023-04-17 ENCOUNTER — Other Ambulatory Visit: Payer: Self-pay

## 2023-04-17 ENCOUNTER — Ambulatory Visit: Payer: Self-pay | Attending: Nurse Practitioner | Admitting: Nurse Practitioner

## 2023-04-17 VITALS — BP 150/90 | HR 97 | Ht 67.0 in | Wt 183.0 lb

## 2023-04-17 DIAGNOSIS — R030 Elevated blood-pressure reading, without diagnosis of hypertension: Secondary | ICD-10-CM

## 2023-04-17 DIAGNOSIS — I10 Essential (primary) hypertension: Secondary | ICD-10-CM

## 2023-04-17 DIAGNOSIS — Z1211 Encounter for screening for malignant neoplasm of colon: Secondary | ICD-10-CM

## 2023-04-17 DIAGNOSIS — Z7984 Long term (current) use of oral hypoglycemic drugs: Secondary | ICD-10-CM

## 2023-04-17 DIAGNOSIS — Z8719 Personal history of other diseases of the digestive system: Secondary | ICD-10-CM

## 2023-04-17 DIAGNOSIS — E119 Type 2 diabetes mellitus without complications: Secondary | ICD-10-CM

## 2023-04-17 MED ORDER — JANUMET 50-1000 MG PO TABS
1.0000 | ORAL_TABLET | Freq: Two times a day (BID) | ORAL | 1 refills | Status: DC
  Filled 2023-04-17 – 2023-05-05 (×2): qty 180, 90d supply, fill #0

## 2023-04-17 MED ORDER — GLIMEPIRIDE 4 MG PO TABS
4.0000 mg | ORAL_TABLET | Freq: Every day | ORAL | 1 refills | Status: DC
  Filled 2023-04-17 – 2023-05-28 (×2): qty 90, 90d supply, fill #0

## 2023-04-17 MED ORDER — OMEPRAZOLE 20 MG PO CPDR
20.0000 mg | DELAYED_RELEASE_CAPSULE | Freq: Two times a day (BID) | ORAL | 1 refills | Status: DC
Start: 2023-04-17 — End: 2023-07-22
  Filled 2023-04-17 – 2023-05-05 (×2): qty 180, 90d supply, fill #0

## 2023-04-17 NOTE — Progress Notes (Signed)
Assessment & Plan:  Kevin [name] was seen today for medical management of chronic issues.  Diagnoses and all orders for this visit:  Primary hypertension Continue all antihypertensives as prescribed.  Reminded to bring in blood pressure log for follow  up appointment.  RECOMMENDATIONS: DASH/Mediterranean Diets are healthier choices for HTN.   -     Aldosterone + renin activity w/ ratio  Colon cancer screening -     Fecal occult blood, imunochemical([facility]); Future  Diastolic blood pressure 90 mm Hg or higher -     Aldosterone + renin activity w/ ratio -     Thyroid Panel With TSH  Diabetes mellitus without complication (HCC) -     Hemoglobin A1c -     sitaGLIPtin-metformin (JANUMET) 50-1000 MG tablet; Take 1 tablet by mouth 2 (two) times daily with a meal. -     glimepiride (AMARYL) 4 MG tablet; Take 1 tablet (4 mg total) by mouth daily with breakfast. Continue blood sugar control as discussed in office today, low carbohydrate diet, and regular physical exercise as tolerated, 150 minutes per week (30 min each day, 5 days per week, or 50 min 3 days per week). Keep blood sugar logs with fasting goal of 90-130 mg/dl, post prandial (after you eat) less than 180.  For Hypoglycemia: BS <60 and Hyperglycemia BS >400; contact the clinic ASAP. Annual eye exams and foot exams are recommended.   History of gastritis -     omeprazole (PRILOSEC) 20 MG capsule; Take 1 capsule (20 mg total) by mouth 2 (two) times daily before a meal.    Patient has been counseled on age-appropriate routine health concerns for screening and prevention. These are reviewed and up-to-date. Referrals have been placed accordingly. Immunizations are up-to-date or declined.    Subjective:   Chief Complaint  Patient presents with   Medical Management of Chronic Issues   HPI Kevin [name] 57 y.o. male presents to office today for follow up to HTN  He has a past medical history of COVID (04/20/2020), DM2, GERD,  Hypertension, and MRSA    HTN Blood pressure is elevated today.  Unfortunately he has not been checking his blood pressure at home recently and does not have a log today.  He is taking valsartan 80 mg daily and amlodipine 10 mg daily as prescribed. BP Readings from Last 3 Encounters:  04/17/23 (!) 150/90  11/19/22 (!) 163/96  11/13/22 (!) 179/114    Endorses chronic intermittent pain and discoloration in left toes. He was evaluated by vascular several years ago (2020) Due to COVID the arteriogram that was supposed to be performed was never able to be completed. He states his symptoms are worse with cold temperatures. Denies any discoloration of pain in his hands. Declines referral back to vascular today.    Lumbar Back Pain He has a history of chronic back pain with numerous back surgeries. States a few months ago he was moving a Child psychotherapist and re injured his back. Pain is still present today and aggravated by movement and activity however he declines any medications or orthopedic referral for now.    Review of Systems  Constitutional:  Negative for fever, malaise/fatigue and weight loss.  HENT: Negative.  Negative for nosebleeds.   Eyes: Negative.  Negative for blurred vision, double vision and photophobia.  Respiratory: Negative.  Negative for cough and shortness of breath.   Cardiovascular: Negative.  Negative for chest pain, palpitations and leg swelling.  Gastrointestinal: Negative.  Negative for heartburn, nausea and  vomiting.  Musculoskeletal:  Positive for back pain and myalgias.  Neurological: Negative.  Negative for dizziness, focal weakness, seizures and headaches.  Psychiatric/Behavioral: Negative.  Negative for suicidal ideas.     Past Medical History:  Diagnosis Date   COVID 04/20/2020   Diabetes mellitus without complication (HCC)    GERD (gastroesophageal reflux disease)    Hypertension    MRSA (methicillin resistant Staphylococcus aureus)     Past Surgical History:   Procedure Laterality Date   BACK SURGERY     LAPAROSCOPIC GASTRIC BANDING     TRIGGER FINGER RELEASE Right 10/05/2020   Procedure: RELEASE TRIGGER FINGER RIGHT LONG FINGER;  Surgeon: Tarry Kos, MD;  Location: [facility];  Service: Orthopedics;  Laterality: Right;    Family History  Problem Relation Age of Onset   Heart disease Mother    Hypertension Mother    Diabetes Sister    Hypertension Sister    Stroke Brother    Diabetes Brother    Heart disease Brother    Heart attack Brother    Hypertension Brother    Hypertension Father     Social History Reviewed with no changes to be made today.   Outpatient Medications Prior to Visit  Medication Sig Dispense Refill   amLODipine (NORVASC) 10 MG tablet Take 1 tablet (10 mg total) by mouth daily. 90 tablet 0   atorvastatin (LIPITOR) 40 MG tablet Take 1 tablet (40 mg total) by mouth daily. 90 tablet 1   Blood Glucose Monitoring Suppl (TRUE METRIX METER) w/Device KIT Use as instructed 1 kit 0   glucose blood (TRUE METRIX BLOOD GLUCOSE TEST) test strip Use as instructed. Check blood glucose level by fingerstick twice per day. 200 each 12   TRUEplus Lancets 28G MISC Use as instructed. Check blood glucose level by fingerstick twice per day. 200 each 6   valsartan (DIOVAN) 80 MG tablet Take 1 tablet (80 mg total) by mouth daily. 90 tablet 3   glimepiride (AMARYL) 4 MG tablet Take 1 tablet (4 mg total) by mouth daily with breakfast. 90 tablet 1   omeprazole (PRILOSEC) 20 MG capsule Take 1 capsule (20 mg total) by mouth 2 (two) times daily before a meal. 180 capsule 0   sitaGLIPtin-metformin (JANUMET) 50-1000 MG tablet Take 1 tablet by mouth 2 (two) times daily with a meal. 60 tablet 0   hydrOXYzine (ATARAX) 25 MG tablet Take 1 tablet (25 mg total) by mouth 3 (three) times daily as needed. (Patient not taking: Reported on 11/19/2022) 90 tablet 1   No facility-administered medications prior to visit.    Allergies  Allergen  Reactions   Tramadol     seizure       Objective:    BP (!) 150/90   Pulse 97   Ht 5\' 7"  (1.702 m)   Wt 183 lb (83 kg)   SpO2 97%   BMI 28.66 kg/m  Wt Readings from Last 3 Encounters:  04/17/23 183 lb (83 kg)  11/19/22 194 lb 6.4 oz (88.2 kg)  06/01/22 186 lb 9.6 oz (84.6 kg)    Physical Exam Vitals and nursing note reviewed.  Constitutional:      Appearance: He is well-developed.  HENT:     Head: Normocephalic and atraumatic.  Cardiovascular:     Rate and Rhythm: Normal rate and regular rhythm.     Heart sounds: Normal heart sounds. No murmur heard.    No friction rub. No gallop.  Pulmonary:  Effort: Pulmonary effort is normal. No tachypnea or respiratory distress.     Breath sounds: Normal breath sounds. No decreased breath sounds, wheezing, rhonchi or rales.  Chest:     Chest wall: No tenderness.  Abdominal:     General: Bowel sounds are normal.     Palpations: Abdomen is soft.  Musculoskeletal:        General: Normal range of motion.     Cervical back: Normal range of motion.  Skin:    General: Skin is warm and dry.  Neurological:     Mental Status: He is alert and oriented to person, place, and time.     Coordination: Coordination normal.  Psychiatric:        Behavior: Behavior normal. Behavior is cooperative.        Thought Content: Thought content normal.        Judgment: Judgment normal.          Patient has been counseled extensively about nutrition and exercise as well as the importance of adherence with medications and regular follow-up. The patient was given clear instructions to go to ER or return to medical center if symptoms don't improve, worsen or new problems develop. The patient verbalized understanding.   Follow-up: Return in about 3 months (around 07/18/2023) for HTN.   Claiborne Rigg, FNP-BC [facility] [place], [place] [phone]   04/17/2023, 12:34 PM

## 2023-04-23 ENCOUNTER — Other Ambulatory Visit: Payer: Self-pay

## 2023-05-06 ENCOUNTER — Other Ambulatory Visit: Payer: Self-pay

## 2023-05-07 ENCOUNTER — Other Ambulatory Visit: Payer: Self-pay

## 2023-05-20 ENCOUNTER — Encounter: Payer: Self-pay | Admitting: Nurse Practitioner

## 2023-05-28 ENCOUNTER — Other Ambulatory Visit: Payer: Self-pay

## 2023-06-12 ENCOUNTER — Other Ambulatory Visit: Payer: Self-pay

## 2023-06-24 ENCOUNTER — Other Ambulatory Visit: Payer: Self-pay

## 2023-07-03 ENCOUNTER — Other Ambulatory Visit: Payer: Self-pay

## 2023-07-12 ENCOUNTER — Other Ambulatory Visit: Payer: Self-pay

## 2023-07-14 ENCOUNTER — Other Ambulatory Visit: Payer: Self-pay | Admitting: Nurse Practitioner

## 2023-07-14 DIAGNOSIS — I10 Essential (primary) hypertension: Secondary | ICD-10-CM

## 2023-07-15 ENCOUNTER — Other Ambulatory Visit: Payer: Self-pay

## 2023-07-15 MED ORDER — AMLODIPINE BESYLATE 10 MG PO TABS
10.0000 mg | ORAL_TABLET | Freq: Every day | ORAL | 0 refills | Status: DC
Start: 2023-07-15 — End: 2023-07-22
  Filled 2023-07-15: qty 90, 90d supply, fill #0

## 2023-07-15 NOTE — Telephone Encounter (Signed)
Requested Prescriptions  Pending Prescriptions Disp Refills   amLODipine (NORVASC) 10 MG tablet 90 tablet 0    Sig: Take 1 tablet (10 mg total) by mouth daily.     Cardiovascular: Calcium Channel Blockers 2 Failed - 07/14/2023  7:05 AM      Failed - Last BP in normal range    BP Readings from Last 1 Encounters:  04/17/23 (!) 150/90         Passed - Last Heart Rate in normal range    Pulse Readings from Last 1 Encounters:  04/17/23 97         Passed - Valid encounter within last 6 months    Recent Outpatient Visits           2 months ago Primary hypertension   [place] [facility] [place] [place], [place] W, NP   6 months ago Diastolic blood pressure 90 mm Hg or higher   [place] [facility] [place] [facility] [place] Claiborne Rigg, NP   7 months ago Primary hypertension   [place] [facility] [place] [facility] [place] Claiborne Rigg, NP   1 year ago PTSD (post-traumatic stress disorder)   [place] [facility] [place] Claiborne Rigg, NP   1 year ago Essential hypertension   [place] [facility] [place] [facility] [place]. [facility] Claiborne Rigg, NP       Future Appointments             In 1 week Claiborne Rigg, NP [facility] [place] [facility]. [place]

## 2023-07-16 ENCOUNTER — Other Ambulatory Visit: Payer: Self-pay

## 2023-07-22 ENCOUNTER — Other Ambulatory Visit: Payer: Self-pay

## 2023-07-22 ENCOUNTER — Ambulatory Visit: Payer: Self-pay | Attending: Nurse Practitioner | Admitting: Nurse Practitioner

## 2023-07-22 ENCOUNTER — Encounter: Payer: Self-pay | Admitting: Nurse Practitioner

## 2023-07-22 VITALS — BP 129/72 | HR 85 | Ht 67.0 in | Wt 194.0 lb

## 2023-07-22 DIAGNOSIS — Z7984 Long term (current) use of oral hypoglycemic drugs: Secondary | ICD-10-CM

## 2023-07-22 DIAGNOSIS — I10 Essential (primary) hypertension: Secondary | ICD-10-CM

## 2023-07-22 DIAGNOSIS — D649 Anemia, unspecified: Secondary | ICD-10-CM

## 2023-07-22 DIAGNOSIS — E119 Type 2 diabetes mellitus without complications: Secondary | ICD-10-CM

## 2023-07-22 DIAGNOSIS — E785 Hyperlipidemia, unspecified: Secondary | ICD-10-CM

## 2023-07-22 DIAGNOSIS — Z8719 Personal history of other diseases of the digestive system: Secondary | ICD-10-CM

## 2023-07-22 DIAGNOSIS — E1169 Type 2 diabetes mellitus with other specified complication: Secondary | ICD-10-CM

## 2023-07-22 MED ORDER — VALSARTAN 80 MG PO TABS
80.0000 mg | ORAL_TABLET | Freq: Every day | ORAL | 1 refills | Status: DC
  Filled 2023-07-22 – 2023-08-26 (×2): qty 90, 90d supply, fill #0
  Filled 2023-11-24 – 2023-11-25 (×2): qty 90, 90d supply, fill #1

## 2023-07-22 MED ORDER — GLIMEPIRIDE 4 MG PO TABS
4.0000 mg | ORAL_TABLET | Freq: Every day | ORAL | 1 refills | Status: DC
  Filled 2023-07-22 – 2023-08-26 (×2): qty 90, 90d supply, fill #0
  Filled 2023-11-24: qty 90, 90d supply, fill #1

## 2023-07-22 MED ORDER — JANUMET 50-1000 MG PO TABS
1.0000 | ORAL_TABLET | Freq: Two times a day (BID) | ORAL | 1 refills | Status: DC
  Filled 2023-07-22: qty 180, 90d supply, fill #0

## 2023-07-22 MED ORDER — ATORVASTATIN CALCIUM 40 MG PO TABS
40.0000 mg | ORAL_TABLET | Freq: Every day | ORAL | 1 refills | Status: DC
Start: 2023-07-22 — End: 2024-03-18
  Filled 2023-07-22 – 2023-09-10 (×2): qty 90, 90d supply, fill #0
  Filled 2023-12-09 – 2023-12-10 (×2): qty 90, 90d supply, fill #1

## 2023-07-22 MED ORDER — AMLODIPINE BESYLATE 10 MG PO TABS
10.0000 mg | ORAL_TABLET | Freq: Every day | ORAL | 1 refills | Status: DC
Start: 2023-07-22 — End: 2024-04-14
  Filled 2023-07-22 – 2023-10-14 (×3): qty 90, 90d supply, fill #0
  Filled 2024-01-08: qty 90, 90d supply, fill #1

## 2023-07-22 MED ORDER — INFLUENZA VIRUS VACC SPLIT PF (FLUZONE) 0.5 ML IM SUSY
0.5000 mL | PREFILLED_SYRINGE | Freq: Once | INTRAMUSCULAR | 0 refills | Status: AC
  Filled 2023-07-22: qty 0.5, 1d supply, fill #0

## 2023-07-22 MED ORDER — OMEPRAZOLE 20 MG PO CPDR
20.0000 mg | DELAYED_RELEASE_CAPSULE | Freq: Two times a day (BID) | ORAL | 1 refills | Status: DC
  Filled 2023-07-22 – 2023-08-26 (×2): qty 180, 90d supply, fill #0
  Filled 2023-11-24: qty 180, 90d supply, fill #1

## 2023-07-22 NOTE — Progress Notes (Signed)
Assessment & Plan:  Kevin [name] was seen today for medical management of chronic issues.  Diagnoses and all orders for this visit:  Primary hypertension -     Basic metabolic panel -     amLODipine (NORVASC) 10 MG tablet; Take 1 tablet (10 mg total) by mouth daily. -     valsartan (DIOVAN) 80 MG tablet; Take 1 tablet (80 mg total) by mouth daily. Continue all antihypertensives as prescribed.  Reminded to bring in blood pressure log for follow  up appointment.  RECOMMENDATIONS: DASH/Mediterranean Diets are healthier choices for HTN.    Low hemoglobin -     CBC with Differential  Hyperlipidemia associated with type 2 diabetes mellitus (HCC) -     atorvastatin (LIPITOR) 40 MG tablet; Take 1 tablet (40 mg total) by mouth daily. Lab Results  Component Value Date   LDLCALC 81 02/26/2022    Diabetes mellitus without complication (HCC) -     glimepiride (AMARYL) 4 MG tablet; Take 1 tablet (4 mg total) by mouth daily with breakfast. -     sitaGLIPtin-metformin (JANUMET) 50-1000 MG tablet; Take 1 tablet by mouth 2 (two) times daily with a meal. A1c currently at goal.  History of gastritis -     omeprazole (PRILOSEC) 20 MG capsule; Take 1 capsule (20 mg total) by mouth 2 (two) times daily before a meal. INSTRUCTIONS: Avoid GERD Triggers: acidic, spicy or fried foods, caffeine, coffee, sodas,  alcohol and chocolate.      Patient has been counseled on age-appropriate routine health concerns for screening and prevention. These are reviewed and up-to-date. Referrals have been placed accordingly. Immunizations are up-to-date or declined.    Subjective:   Chief Complaint  Patient presents with   Medical Management of Chronic Issues    Kevin [name] 57 y.o. male presents to office today to follow-up for hypertension.  He has a past medical history of COVID (04/20/2020), DM2, GERD, chronic low back pain, hypertension, and MRSA   HTN Blood pressure is well-controlled today.  He is taking  valsartan 80 mg and amlodipine 10 mg daily as prescribed. BP Readings from Last 3 Encounters:  07/22/23 129/72  04/17/23 (!) 150/90  11/19/22 (!) 163/96      Lumbar Back Pain He has a history of chronic back pain with numerous back surgeries. Earlier this year he was moving a Child psychotherapist and re injured his back. Pain is still present today and aggravated by movement and activity however he declines any medications or orthopedic referral for now.  States his life is being impacted by his chronic back pain but he will deal with it for now on his own   Review of Systems  Constitutional:  Negative for fever, malaise/fatigue and weight loss.  HENT: Negative.  Negative for nosebleeds.   Eyes: Negative.  Negative for blurred vision, double vision and photophobia.  Respiratory: Negative.  Negative for cough and shortness of breath.   Cardiovascular: Negative.  Negative for chest pain, palpitations and leg swelling.  Gastrointestinal: Negative.  Negative for heartburn, nausea and vomiting.  Musculoskeletal:  Positive for back pain. Negative for myalgias.  Neurological: Negative.  Negative for dizziness, focal weakness, seizures and headaches.  Psychiatric/Behavioral: Negative.  Negative for suicidal ideas.     Past Medical History:  Diagnosis Date   COVID 04/20/2020   Diabetes mellitus without complication (HCC)    GERD (gastroesophageal reflux disease)    Hypertension    MRSA (methicillin resistant Staphylococcus aureus)     Past Surgical  History:  Procedure Laterality Date   BACK SURGERY     LAPAROSCOPIC GASTRIC BANDING     TRIGGER FINGER RELEASE Right 10/05/2020   Procedure: RELEASE TRIGGER FINGER RIGHT LONG FINGER;  Surgeon: Tarry Kos, MD;  Location: [facility];  Service: Orthopedics;  Laterality: Right;    Family History  Problem Relation Age of Onset   Heart disease Mother    Hypertension Mother    Diabetes Sister    Hypertension Sister    Stroke Brother     Diabetes Brother    Heart disease Brother    Heart attack Brother    Hypertension Brother    Hypertension Father     Social History Reviewed with no changes to be made today.   Outpatient Medications Prior to Visit  Medication Sig Dispense Refill   Blood Glucose Monitoring Suppl (TRUE METRIX METER) w/Device KIT Use as instructed 1 kit 0   glucose blood (TRUE METRIX BLOOD GLUCOSE TEST) test strip Use as instructed. Check blood glucose level by fingerstick twice per day. 200 each 12   TRUEplus Lancets 28G MISC Use as instructed. Check blood glucose level by fingerstick twice per day. 200 each 6   amLODipine (NORVASC) 10 MG tablet Take 1 tablet (10 mg total) by mouth daily. 90 tablet 0   atorvastatin (LIPITOR) 40 MG tablet Take 1 tablet (40 mg total) by mouth daily. 90 tablet 1   glimepiride (AMARYL) 4 MG tablet Take 1 tablet (4 mg total) by mouth daily with breakfast. 90 tablet 1   omeprazole (PRILOSEC) 20 MG capsule Take 1 capsule (20 mg total) by mouth 2 (two) times daily before a meal. 180 capsule 1   sitaGLIPtin-metformin (JANUMET) 50-1000 MG tablet Take 1 tablet by mouth 2 (two) times daily with a meal. 180 tablet 1   valsartan (DIOVAN) 80 MG tablet Take 1 tablet (80 mg total) by mouth daily. 90 tablet 3   No facility-administered medications prior to visit.    Allergies  Allergen Reactions   Tramadol     seizure       Objective:    BP 129/72 (BP Location: Left Arm, Patient Position: Sitting, Cuff Size: Normal)   Pulse 85   Ht 5\' 7"  (1.702 m)   Wt 194 lb (88 kg)   SpO2 98%   BMI 30.38 kg/m  Wt Readings from Last 3 Encounters:  07/22/23 194 lb (88 kg)  04/17/23 183 lb (83 kg)  11/19/22 194 lb 6.4 oz (88.2 kg)    Physical Exam Vitals and nursing note reviewed.  Constitutional:      Appearance: He is well-developed.  HENT:     Head: Normocephalic and atraumatic.  Cardiovascular:     Rate and Rhythm: Normal rate and regular rhythm.     Heart sounds: Normal heart  sounds. No murmur heard.    No friction rub. No gallop.  Pulmonary:     Effort: Pulmonary effort is normal. No tachypnea or respiratory distress.     Breath sounds: Normal breath sounds. No decreased breath sounds, wheezing, rhonchi or rales.  Chest:     Chest wall: No tenderness.  Abdominal:     General: Bowel sounds are normal.     Palpations: Abdomen is soft.  Musculoskeletal:        General: Normal range of motion.     Cervical back: Normal range of motion.  Skin:    General: Skin is warm and dry.  Neurological:     Mental  Status: He is alert and oriented to person, place, and time.     Coordination: Coordination normal.  Psychiatric:        Behavior: Behavior normal. Behavior is cooperative.        Thought Content: Thought content normal.        Judgment: Judgment normal.          Patient has been counseled extensively about nutrition and exercise as well as the importance of adherence with medications and regular follow-up. The patient was given clear instructions to go to ER or return to medical center if symptoms don't improve, worsen or new problems develop. The patient verbalized understanding.   Follow-up: Return in about 3 months (around 10/22/2023).   Claiborne Rigg, FNP-BC [facility] [place], [place] [phone]   07/22/2023, 8:56 AM

## 2023-07-23 LAB — CBC WITH DIFFERENTIAL/PLATELET
Basophils Absolute: 0.1 10*3/uL (ref 0.0–0.2)
Basos: 1 %
EOS (ABSOLUTE): 0.2 10*3/uL (ref 0.0–0.4)
Eos: 4 %
Hematocrit: 37.8 % (ref 37.5–51.0)
Hemoglobin: 11.9 g/dL — ABNORMAL LOW (ref 13.0–17.7)
Immature Grans (Abs): 0 10*3/uL (ref 0.0–0.1)
Immature Granulocytes: 0 %
Lymphocytes Absolute: 1.4 10*3/uL (ref 0.7–3.1)
Lymphs: 21 %
MCH: 26.5 pg — ABNORMAL LOW (ref 26.6–33.0)
MCHC: 31.5 g/dL (ref 31.5–35.7)
MCV: 84 fL (ref 79–97)
Monocytes Absolute: 0.5 10*3/uL (ref 0.1–0.9)
Monocytes: 8 %
Neutrophils Absolute: 4.4 10*3/uL (ref 1.4–7.0)
Neutrophils: 66 %
Platelets: 281 10*3/uL (ref 150–450)
RBC: 4.49 x10E6/uL (ref 4.14–5.80)
RDW: 14.9 % (ref 11.6–15.4)
WBC: 6.6 10*3/uL (ref 3.4–10.8)

## 2023-07-23 LAB — BASIC METABOLIC PANEL
BUN/Creatinine Ratio: 6 — ABNORMAL LOW (ref 9–20)
BUN: 7 mg/dL (ref 6–24)
CO2: 22 mmol/L (ref 20–29)
Calcium: 9.6 mg/dL (ref 8.7–10.2)
Chloride: 101 mmol/L (ref 96–106)
Creatinine, Ser: 1.09 mg/dL (ref 0.76–1.27)
Glucose: 109 mg/dL — ABNORMAL HIGH (ref 70–99)
Potassium: 4.9 mmol/L (ref 3.5–5.2)
Sodium: 139 mmol/L (ref 134–144)
eGFR: 79 mL/min/{1.73_m2} (ref >59)

## 2023-08-26 ENCOUNTER — Other Ambulatory Visit: Payer: Self-pay

## 2023-09-10 ENCOUNTER — Other Ambulatory Visit: Payer: Self-pay

## 2023-10-02 ENCOUNTER — Other Ambulatory Visit: Payer: Self-pay | Admitting: Nurse Practitioner

## 2023-10-02 DIAGNOSIS — E119 Type 2 diabetes mellitus without complications: Secondary | ICD-10-CM

## 2023-10-10 ENCOUNTER — Other Ambulatory Visit: Payer: Self-pay

## 2023-10-11 ENCOUNTER — Other Ambulatory Visit: Payer: Self-pay

## 2023-10-14 ENCOUNTER — Other Ambulatory Visit: Payer: Self-pay

## 2023-10-15 ENCOUNTER — Other Ambulatory Visit: Payer: Self-pay

## 2023-10-19 ENCOUNTER — Other Ambulatory Visit: Payer: Self-pay | Admitting: Nurse Practitioner

## 2023-10-19 DIAGNOSIS — E119 Type 2 diabetes mellitus without complications: Secondary | ICD-10-CM

## 2023-10-21 ENCOUNTER — Other Ambulatory Visit: Payer: Self-pay

## 2023-10-21 MED ORDER — JANUMET 50-1000 MG PO TABS
1.0000 | ORAL_TABLET | Freq: Two times a day (BID) | ORAL | 0 refills | Status: DC
  Filled 2023-10-21: qty 180, 90d supply, fill #0

## 2023-11-15 ENCOUNTER — Telehealth: Payer: Self-pay | Admitting: Nurse Practitioner

## 2023-11-15 NOTE — Telephone Encounter (Signed)
 Contacted pt confirm appt! 3/11

## 2023-11-19 ENCOUNTER — Other Ambulatory Visit: Payer: Self-pay

## 2023-11-19 ENCOUNTER — Encounter: Payer: Self-pay | Admitting: Nurse Practitioner

## 2023-11-19 ENCOUNTER — Ambulatory Visit: Payer: Self-pay | Attending: Nurse Practitioner | Admitting: Nurse Practitioner

## 2023-11-19 VITALS — BP 172/81 | HR 110 | Resp 19 | Ht 67.0 in | Wt 196.6 lb

## 2023-11-19 DIAGNOSIS — E119 Type 2 diabetes mellitus without complications: Secondary | ICD-10-CM

## 2023-11-19 DIAGNOSIS — R350 Frequency of micturition: Secondary | ICD-10-CM

## 2023-11-19 DIAGNOSIS — Z7984 Long term (current) use of oral hypoglycemic drugs: Secondary | ICD-10-CM

## 2023-11-19 DIAGNOSIS — I10 Essential (primary) hypertension: Secondary | ICD-10-CM

## 2023-11-19 DIAGNOSIS — N529 Male erectile dysfunction, unspecified: Secondary | ICD-10-CM

## 2023-11-19 DIAGNOSIS — D649 Anemia, unspecified: Secondary | ICD-10-CM

## 2023-11-19 LAB — POCT GLYCOSYLATED HEMOGLOBIN (HGB A1C): HbA1c, POC (controlled diabetic range): 6.1 % (ref 0.0–7.0)

## 2023-11-19 MED ORDER — TRUEPLUS LANCETS 28G MISC
6 refills | Status: AC
  Filled 2023-11-19: qty 100, 50d supply, fill #0

## 2023-11-19 MED ORDER — TRUE METRIX BLOOD GLUCOSE TEST VI STRP
ORAL_STRIP | 12 refills | Status: AC
  Filled 2023-11-19: qty 100, 50d supply, fill #0

## 2023-11-19 MED ORDER — TADALAFIL 10 MG PO TABS
10.0000 mg | ORAL_TABLET | ORAL | 1 refills | Status: DC | PRN
Start: 2023-11-19 — End: 2024-05-18
  Filled 2023-11-19: qty 15, 30d supply, fill #0
  Filled 2023-12-16 (×2): qty 15, 30d supply, fill #1
  Filled 2024-01-21: qty 15, 30d supply, fill #2
  Filled 2024-02-18: qty 15, 30d supply, fill #3
  Filled 2024-03-18: qty 15, 30d supply, fill #4
  Filled 2024-04-18: qty 15, 30d supply, fill #5

## 2023-11-19 NOTE — Progress Notes (Signed)
 Assessment & Plan:  Deontrey was seen today for medical management of chronic issues.  Diagnoses and all orders for this visit:  Diabetes mellitus treated with oral medication (HCC) -     POCT glycosylated hemoglobin (Hb A1C) -     Microalbumin / creatinine urine ratio -     glucose blood (TRUE METRIX BLOOD GLUCOSE TEST) test strip; Use as instructed. Check blood glucose level by fingerstick twice per day. -     TRUEplus Lancets 28G MISC; Use as instructed. Check blood glucose level by fingerstick twice per day. Continue blood sugar control as discussed in office today, low carbohydrate diet, and regular physical exercise as tolerated, 150 minutes per week (30 min each day, 5 days per week, or 50 min 3 days per week). Keep blood sugar logs with fasting goal of 90-130 mg/dl, post prandial (after you eat) less than 180.  For Hypoglycemia: BS <60 and Hyperglycemia BS >400; contact the clinic ASAP. Annual eye exams and foot exams are recommended.   Low hemoglobin -     CBC with Differential  Primary hypertension Continue all antihypertensives as prescribed.  Reminded to bring in blood pressure log for follow  up appointment.  RECOMMENDATIONS: DASH/Mediterranean Diets are healthier choices for HTN.    Erectile dysfunction, unspecified erectile dysfunction type -     tadalafil (CIALIS) 10 MG tablet; Take 1 tablet (10 mg total) by mouth every other day as needed for erectile dysfunction. And urinary frequency  Increased urinary frequency -     tadalafil (CIALIS) 10 MG tablet; Take 1 tablet (10 mg total) by mouth every other day as needed for erectile dysfunction. And urinary frequency    Patient has been counseled on age-appropriate routine health concerns for screening and prevention. These are reviewed and up-to-date. Referrals have been placed accordingly. Immunizations are up-to-date or declined.    Subjective:   Chief Complaint  Patient presents with   Medical Management of Chronic  Issues    Jadier Lascala 58 y.o. male presents to office today for follow up to HTN and DM  He has a past medical history of COVID (04/20/2020), DM2, GERD, chronic low back pain, hypertension, and MRSA   Endorses worsening vision when driving at night.  HTN Currently taking valsartan 80 mg daily along with amlodipine 10 mg daily. Blood pressure is significantly elevated today. He does drink caffeine. Reports normal blood pressure readings at home.  BP Readings from Last 3 Encounters:  11/19/23 (!) 172/81  07/22/23 129/72  04/17/23 (!) 150/90    DM 2 Diabetes is well controlled. Currently taking glimepiride 4mg  daily and janumet max dose twice daily.  Lab Results  Component Value Date   HGBA1C 6.1 11/19/2023    Lab Results  Component Value Date   HGBA1C 6.4 (H) 04/17/2023   LDL not quite at goal.  Lab Results  Component Value Date   LDLCALC 81 02/26/2022    Currently using a cane due to his chronic back pain. Declines analgesics or referrals today.   Requesting refill of tadalafil for erectile dysfunction and symptoms of BPH with LUTS (urinary frequency) He has been receiving this medication through an outside source.   Review of Systems  Constitutional:  Negative for fever, malaise/fatigue and weight loss.  HENT: Negative.  Negative for nosebleeds.   Eyes: Negative.  Negative for blurred vision, double vision and photophobia.  Respiratory: Negative.  Negative for cough and shortness of breath.   Cardiovascular: Negative.  Negative for chest pain,  palpitations and leg swelling.  Gastrointestinal: Negative.  Negative for heartburn, nausea and vomiting.  Genitourinary:  Positive for frequency.       ED  Musculoskeletal: Negative.  Negative for myalgias.  Neurological: Negative.  Negative for dizziness, focal weakness, seizures and headaches.  Psychiatric/Behavioral: Negative.  Negative for suicidal ideas.     Past Medical History:  Diagnosis Date   COVID 04/20/2020    Diabetes mellitus without complication (HCC)    GERD (gastroesophageal reflux disease)    Hypertension    MRSA (methicillin resistant Staphylococcus aureus)     Past Surgical History:  Procedure Laterality Date   BACK SURGERY     LAPAROSCOPIC GASTRIC BANDING     TRIGGER FINGER RELEASE Right 10/05/2020   Procedure: RELEASE TRIGGER FINGER RIGHT LONG FINGER;  Surgeon: Tarry Kos, MD;  Location: [facility];  Service: Orthopedics;  Laterality: Right;    Family History  Problem Relation Age of Onset   Heart disease Mother    Hypertension Mother    Diabetes Sister    Hypertension Sister    Stroke Brother    Diabetes Brother    Heart disease Brother    Heart attack Brother    Hypertension Brother    Hypertension Father     Social History Reviewed with no changes to be made today.   Outpatient Medications Prior to Visit  Medication Sig Dispense Refill   amLODipine (NORVASC) 10 MG tablet Take 1 tablet (10 mg total) by mouth daily. 90 tablet 1   atorvastatin (LIPITOR) 40 MG tablet Take 1 tablet (40 mg total) by mouth daily. 90 tablet 1   Blood Glucose Monitoring Suppl (TRUE METRIX METER) w/Device KIT Use as instructed 1 kit 0   glimepiride (AMARYL) 4 MG tablet Take 1 tablet (4 mg total) by mouth daily with breakfast. 90 tablet 1   JANUMET 50-1000 MG tablet Take 1 tablet by mouth 2 (two) times daily with a meal. 180 tablet 0   omeprazole (PRILOSEC) 20 MG capsule Take 1 capsule (20 mg total) by mouth 2 (two) times daily before a meal. 180 capsule 1   valsartan (DIOVAN) 80 MG tablet Take 1 tablet (80 mg total) by mouth daily. 90 tablet 1   glucose blood (TRUE METRIX BLOOD GLUCOSE TEST) test strip Use as instructed. Check blood glucose level by fingerstick twice per day. 200 each 12   TRUEplus Lancets 28G MISC Use as instructed. Check blood glucose level by fingerstick twice per day. 200 each 6   No facility-administered medications prior to visit.    Allergies   Allergen Reactions   Tramadol     seizure       Objective:    BP (!) 172/81 (BP Location: Left Arm, Patient Position: Sitting, Cuff Size: Normal)   Pulse (!) 110   Resp 19   Ht 5\' 7"  (1.702 m)   Wt 196 lb 9.6 oz (89.2 kg)   SpO2 100%   BMI 30.79 kg/m  Wt Readings from Last 3 Encounters:  11/19/23 196 lb 9.6 oz (89.2 kg)  07/22/23 194 lb (88 kg)  04/17/23 183 lb (83 kg)    Physical Exam Vitals and nursing note reviewed.  Constitutional:      Appearance: He is well-developed.  HENT:     Head: Normocephalic and atraumatic.  Cardiovascular:     Rate and Rhythm: Normal rate and regular rhythm.     Heart sounds: Normal heart sounds. No murmur heard.    No friction  rub. No gallop.  Pulmonary:     Effort: Pulmonary effort is normal. No tachypnea or respiratory distress.     Breath sounds: Normal breath sounds. No decreased breath sounds, wheezing, rhonchi or rales.  Chest:     Chest wall: No tenderness.  Abdominal:     General: Bowel sounds are normal.     Palpations: Abdomen is soft.  Musculoskeletal:        General: Normal range of motion.     Cervical back: Normal range of motion.  Skin:    General: Skin is warm and dry.  Neurological:     Mental Status: He is alert and oriented to person, place, and time.     Coordination: Coordination normal.  Psychiatric:        Behavior: Behavior normal. Behavior is cooperative.        Thought Content: Thought content normal.        Judgment: Judgment normal.          Patient has been counseled extensively about nutrition and exercise as well as the importance of adherence with medications and regular follow-up. The patient was given clear instructions to go to ER or return to medical center if symptoms don't improve, worsen or new problems develop. The patient verbalized understanding.   Follow-up: Return in about 6 months (around 05/21/2024).   Claiborne Rigg, FNP-BC [facility]  [place], [place] [phone]   11/19/2023, 11:57 AM

## 2023-11-22 LAB — CBC WITH DIFFERENTIAL/PLATELET
Basophils Absolute: 0.1 10*3/uL (ref 0.0–0.2)
Basos: 1 %
EOS (ABSOLUTE): 0.1 10*3/uL (ref 0.0–0.4)
Eos: 2 %
Hematocrit: 38 % (ref 37.5–51.0)
Hemoglobin: 12.4 g/dL — ABNORMAL LOW (ref 13.0–17.7)
Immature Grans (Abs): 0 10*3/uL (ref 0.0–0.1)
Immature Granulocytes: 0 %
Lymphocytes Absolute: 1.2 10*3/uL (ref 0.7–3.1)
Lymphs: 16 %
MCH: 27 pg (ref 26.6–33.0)
MCHC: 32.6 g/dL (ref 31.5–35.7)
MCV: 83 fL (ref 79–97)
Monocytes Absolute: 0.5 10*3/uL (ref 0.1–0.9)
Monocytes: 7 %
Neutrophils Absolute: 5.3 10*3/uL (ref 1.4–7.0)
Neutrophils: 74 %
Platelets: 301 10*3/uL (ref 150–450)
RBC: 4.6 x10E6/uL (ref 4.14–5.80)
RDW: 14.4 % (ref 11.6–15.4)
WBC: 7.2 10*3/uL (ref 3.4–10.8)

## 2023-11-22 LAB — MICROALBUMIN / CREATININE URINE RATIO
Creatinine, Urine: 107 mg/dL
Microalb/Creat Ratio: 26 mg/g{creat} (ref 0–29)
Microalbumin, Urine: 27.3 ug/mL

## 2023-11-24 ENCOUNTER — Encounter: Payer: Self-pay | Admitting: Nurse Practitioner

## 2023-11-25 ENCOUNTER — Other Ambulatory Visit: Payer: Self-pay

## 2023-12-10 ENCOUNTER — Other Ambulatory Visit: Payer: Self-pay

## 2023-12-11 ENCOUNTER — Other Ambulatory Visit: Payer: Self-pay

## 2023-12-12 ENCOUNTER — Other Ambulatory Visit: Payer: Self-pay

## 2023-12-13 ENCOUNTER — Other Ambulatory Visit: Payer: Self-pay

## 2023-12-16 ENCOUNTER — Other Ambulatory Visit: Payer: Self-pay

## 2023-12-20 ENCOUNTER — Other Ambulatory Visit: Payer: Self-pay

## 2023-12-20 ENCOUNTER — Telehealth (HOSPITAL_COMMUNITY): Payer: Self-pay

## 2023-12-20 NOTE — Telephone Encounter (Signed)
 Submitted application for JANUMET to [facility] for patient assistance.   Phone: [phone]

## 2023-12-27 ENCOUNTER — Other Ambulatory Visit: Payer: Self-pay

## 2023-12-27 ENCOUNTER — Telehealth: Payer: Self-pay

## 2023-12-27 NOTE — Telephone Encounter (Signed)
 Received notification from [facility] regarding approval for JANUMET . Patient assistance approved from 12/22/2023 to 12/22/2023.  Medication will ship to [address] [place], [place]  [address]  Pt ID: RX# [id number]  Company phone: [phone]  1ST SHIPMENT UPS TRACKING # [phone] DELIVERED 4/17

## 2024-01-08 ENCOUNTER — Other Ambulatory Visit: Payer: Self-pay

## 2024-01-10 ENCOUNTER — Other Ambulatory Visit: Payer: Self-pay

## 2024-01-13 ENCOUNTER — Other Ambulatory Visit: Payer: Self-pay

## 2024-01-22 ENCOUNTER — Other Ambulatory Visit: Payer: Self-pay

## 2024-01-23 ENCOUNTER — Other Ambulatory Visit: Payer: Self-pay

## 2024-01-31 ENCOUNTER — Telehealth: Payer: Self-pay | Admitting: Orthopaedic Surgery

## 2024-01-31 ENCOUNTER — Encounter (HOSPITAL_COMMUNITY): Payer: Self-pay

## 2024-01-31 ENCOUNTER — Other Ambulatory Visit: Payer: Self-pay

## 2024-01-31 ENCOUNTER — Emergency Department (HOSPITAL_COMMUNITY)
Admission: EM | Admit: 2024-01-31 | Discharge: 2024-01-31 | Disposition: A | Discharge status: Home / Self Care | Attending: Emergency Medicine | Admitting: Emergency Medicine

## 2024-01-31 ENCOUNTER — Emergency Department (HOSPITAL_COMMUNITY)

## 2024-01-31 DIAGNOSIS — X501XXA Overexertion from prolonged static or awkward postures, initial encounter: Secondary | ICD-10-CM | POA: Diagnosis not present

## 2024-01-31 DIAGNOSIS — Y990 Civilian activity done for income or pay: Secondary | ICD-10-CM | POA: Diagnosis not present

## 2024-01-31 DIAGNOSIS — M898X1 Other specified disorders of bone, shoulder: Secondary | ICD-10-CM

## 2024-01-31 DIAGNOSIS — M25512 Pain in left shoulder: Secondary | ICD-10-CM | POA: Diagnosis present

## 2024-01-31 DIAGNOSIS — Z794 Long term (current) use of insulin: Secondary | ICD-10-CM | POA: Diagnosis not present

## 2024-01-31 NOTE — ED Triage Notes (Signed)
 Patient has had left collar bone pain for 2 months. Denies falls. Was working under his car and reached the wrong way and felt a pop. Able to move his arm without difficulty. Pain has not improved.

## 2024-01-31 NOTE — ED Provider Notes (Signed)
 [facility] AT [facility] Provider Note   CSN: [id number] Arrival date & time: 01/31/24  4098     History  Chief Complaint  Patient presents with   Collar Bone Pain    Kevin [name] is a 58 y.o. male who presents with a 59-month history of left clavicle pain.  He states that this began he was working on his car and reached behind him when he felt a popping sensation and immediate discomfort.  He has been self splinting and limiting use of the left upper extremity as a result over the last 2 months.  He presents today because he states that his wife noticed that he had a noticeable protrusion at the medial clavicle on the left.  He denies any distal numbness or paresthesia, does have a noted extensive surgical history with bilateral upper extremities.  Of note he has an orthopedic appointment in less than 1 week, however when he discussed with him that he did not believe it was his neck but it was his collarbone they recommended he be seen in the emergency room.  HPI     Home Medications Prior to Admission medications   Medication Sig Start Date End Date Taking? Authorizing Provider  amLODipine  (NORVASC ) 10 MG tablet Take 1 tablet (10 mg total) by mouth daily. 07/22/23   Fleming, Zelda W, NP  atorvastatin  (LIPITOR) 40 MG tablet Take 1 tablet (40 mg total) by mouth daily. 07/22/23   Fleming, Zelda W, NP  Blood Glucose Monitoring Suppl (TRUE METRIX METER) w/Device KIT Use as instructed 10/23/17   Fleming, Zelda W, NP  glimepiride  (AMARYL ) 4 MG tablet Take 1 tablet (4 mg total) by mouth daily with breakfast. 07/22/23   Fleming, Zelda W, NP  glucose blood (TRUE METRIX BLOOD GLUCOSE TEST) test strip Use as instructed. Check blood glucose level by fingerstick twice per day. 11/19/23   Fleming, Zelda W, NP  JANUMET  50-1000 MG tablet Take 1 tablet by mouth 2 (two) times daily with a meal. 10/21/23   Fleming, Zelda W, NP  omeprazole  (PRILOSEC) 20 MG capsule Take 1  capsule (20 mg total) by mouth 2 (two) times daily before a meal. 07/22/23   Collins Dean, NP  tadalafil  (CIALIS ) 10 MG tablet Take 1 tablet (10 mg total) by mouth every other day as needed for erectile dysfunction. And urinary frequency 11/19/23   Fleming, Zelda W, NP  TRUEplus Lancets 28G MISC Use as instructed. Check blood glucose level by fingerstick twice per day. 11/19/23   Fleming, Zelda W, NP  valsartan  (DIOVAN ) 80 MG tablet Take 1 tablet (80 mg total) by mouth daily. 07/22/23   Fleming, Zelda W, NP      Allergies    Tramadol     Review of Systems   Review of Systems  Musculoskeletal:  Positive for arthralgias.  All other systems reviewed and are negative.   Physical Exam Updated Vital Signs BP (!) 160/130   Pulse (!) 111   Temp 97.7 F (36.5 C) (Oral)   Resp 19   Ht 5\' 7"  (1.702 m)   Wt 83.9 kg   SpO2 100%   BMI 28.98 kg/m  Physical Exam Vitals and nursing note reviewed.  Constitutional:      General: He is not in acute distress.    Appearance: Normal appearance.  HENT:     Head: Normocephalic and atraumatic.     Mouth/Throat:     Mouth: Mucous membranes are moist.  Pharynx: Oropharynx is clear.  Eyes:     Extraocular Movements: Extraocular movements intact.     Conjunctiva/sclera: Conjunctivae normal.     Pupils: Pupils are equal, round, and reactive to light.  Cardiovascular:     Rate and Rhythm: Normal rate and regular rhythm.     Pulses: Normal pulses.     Heart sounds: Normal heart sounds. No murmur heard.    No friction rub. No gallop.  Pulmonary:     Effort: Pulmonary effort is normal.     Breath sounds: Normal breath sounds.  Chest:     Chest wall: Deformity and tenderness present. No mass, lacerations, crepitus or edema.     Comments: Notable protrusion at the medial left clavicle.  Tenderness to palpation of the areas noted. Abdominal:     General: Abdomen is flat. Bowel sounds are normal.     Palpations: Abdomen is soft.  Musculoskeletal:         General: Normal range of motion.     Right shoulder: Normal.     Left shoulder: Normal.     Right upper arm: Normal.     Left upper arm: Normal.     Cervical back: Normal range of motion and neck supple.     Right lower leg: No edema.     Left lower leg: No edema.     Comments: Increased pain at the left medial clavicle with internal and external rotation.  Skin:    General: Skin is warm and dry.     Capillary Refill: Capillary refill takes less than 2 seconds.  Neurological:     General: No focal deficit present.     Mental Status: He is alert and oriented to person, place, and time. Mental status is at baseline.  Psychiatric:        Mood and Affect: Mood normal.     ED Results / Procedures / Treatments   Labs (all labs ordered are listed, but only abnormal results are displayed) Labs Reviewed - No data to display  EKG None  Radiology DG Clavicle Left Result Date: 01/31/2024 CLINICAL DATA:  Left collarbone pain for 2 months. Was working under car yesterday, reached the wrong way and felt a pop. EXAM: LEFT CLAVICLE - 2+ VIEWS COMPARISON:  None Available. FINDINGS: Normal bone mineralization. Normal alignment of the sternoclavicular and acromioclavicular joints. There may be degenerative hypertrophy and sclerosis slightly greater within the left greater than right sternoclavicular joints. No significant left acromioclavicular osteoarthritis. No acute fracture is seen. No dislocation. IMPRESSION: Possible mild degenerative hypertrophy and sclerosis slightly greater within the left greater than right sternoclavicular joints. No acute fracture. Electronically Signed   By: Bertina Broccoli M.D.   On: 01/31/2024 11:51    Procedures Procedures    Medications Ordered in ED Medications - No data to display  ED Course/ Medical Decision Making/ A&P                                 Medical Decision Making Amount and/or Complexity of Data Reviewed Radiology: ordered.   Medical  Decision Making:   Kevin [name] is a 58 y.o. male who presented to the ED today with left clavicle pain detailed above.     Complete initial physical exam performed, notably the patient  was alert and oriented in no apparent distress.  There is notable protrusion of the medial clavicle, and the pain is increased with internal extra  rotation of the left shoulder.  No distal motor or sensory deficit noted.     Reviewed and confirmed nursing documentation for past medical history, family history, social history.    Initial Assessment:   With the patient's presentation of left clavicle pain, most likely diagnosis is medial dislocation of the clavicle. Other diagnoses were considered including (but not limited to) clavicular fracture, metastasis of the medial clavicle. These are considered less likely due to history of present illness and physical exam findings.     Initial Plan:  Obtain imaging of the left clavicle to assess for acute bony pathology. Objective evaluation as below reviewed   Initial Study Results:    Radiology:  All images reviewed independently. Agree with radiology report at this time.   DG Clavicle Left Result Date: 01/31/2024 CLINICAL DATA:  Left collarbone pain for 2 months. Was working under car yesterday, reached the wrong way and felt a pop. EXAM: LEFT CLAVICLE - 2+ VIEWS COMPARISON:  None Available. FINDINGS: Normal bone mineralization. Normal alignment of the sternoclavicular and acromioclavicular joints. There may be degenerative hypertrophy and sclerosis slightly greater within the left greater than right sternoclavicular joints. No significant left acromioclavicular osteoarthritis. No acute fracture is seen. No dislocation. IMPRESSION: Possible mild degenerative hypertrophy and sclerosis slightly greater within the left greater than right sternoclavicular joints. No acute fracture. Electronically Signed   By: Bertina Broccoli M.D.   On: 01/31/2024 11:51    Reassessment and  Plan:   Physical exam along with imaging findings do not show any acute clavicular fracture or dislocation.  As such, patient will be referred to outpatient orthopedics for continued follow-up.  He does have an appointment for this coming up and will follow-up as scheduled.          Final Clinical Impression(s) / ED Diagnoses Final diagnoses:  None    Rx / DC Orders ED Discharge Orders     None         Juanetta Nordmann, PA 01/31/24 1202    Deatra Face, MD 02/01/24 1308

## 2024-02-05 ENCOUNTER — Ambulatory Visit: Admitting: Orthopaedic Surgery

## 2024-02-05 ENCOUNTER — Other Ambulatory Visit: Payer: Self-pay

## 2024-02-05 ENCOUNTER — Other Ambulatory Visit (INDEPENDENT_AMBULATORY_CARE_PROVIDER_SITE_OTHER): Payer: Self-pay

## 2024-02-05 ENCOUNTER — Encounter: Payer: Self-pay | Admitting: Orthopaedic Surgery

## 2024-02-05 DIAGNOSIS — G8929 Other chronic pain: Secondary | ICD-10-CM

## 2024-02-05 DIAGNOSIS — M542 Cervicalgia: Secondary | ICD-10-CM | POA: Diagnosis not present

## 2024-02-05 DIAGNOSIS — M25512 Pain in left shoulder: Secondary | ICD-10-CM

## 2024-02-05 MED ORDER — METHYLPREDNISOLONE ACETATE 40 MG/ML IJ SUSP
40.0000 mg | INTRAMUSCULAR | Status: AC | PRN
  Administered 2024-02-05: 40 mg via INTRA_ARTICULAR

## 2024-02-05 MED ORDER — LIDOCAINE HCL 1 % IJ SOLN
3.0000 mL | INTRAMUSCULAR | Status: AC | PRN
  Administered 2024-02-05: 3 mL

## 2024-02-05 MED ORDER — BUPIVACAINE HCL 0.5 % IJ SOLN
3.0000 mL | INTRAMUSCULAR | Status: AC | PRN
  Administered 2024-02-05: 3 mL via INTRA_ARTICULAR

## 2024-02-05 NOTE — Progress Notes (Signed)
 Office Visit Note   Patient: Kevin [name]           Date of Birth: 1965/11/14           MRN: [id number] Visit Date: 02/05/2024              Requested by: Collins Dean, NP [address] [place] [address] [place],  [place] [address] PCP: Collins Dean, NP   Assessment & Plan: Visit Diagnoses:  1. Neck pain   2. Chronic left shoulder pain     Plan: History of Present Illness Kevin [name] is a 58 year old male who presents with left shoulder pain following an incident.  He experiences significant shoulder pain for two months after reaching back while working under his car, feeling as if 'everything fell apart' in his neck and shoulder area. The pain is persistent around the collarbone and does not radiate down the arm. Certain movements, such as filling water bowls, exacerbate the pain and cause the shoulder to 'lock up.' The pain is described as severe and burning, causing sweating. There is no numbness or radiating pain, and he maintains full range of motion, albeit with pain.  He has a history of neck and back issues, including multiple back surgeries and a recommendation for a triple fusion of the neck, which he declined. He is concerned about potential loss of motion due to growths following injuries, which have previously limited motion in his fingers.  He has not sought healthcare for twenty years and is apprehensive about medical interventions due to past experiences where surgeries and physical therapy worsened his condition. He is particularly fearful of injections and physical therapy leading to further loss of motion.  Physical Exam MUSCULOSKELETAL: Rotator cuff function normal to manual muscle testing. Left sternoclavicular joint enlarged and tender.  Dovray joint not mobile.  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 Significant pain, swelling, and limited function in left sternoclavicular joint. Anxiety about  treatment due to past experiences. - Administer cortisone injection in sternoclavicular joint under ultrasound guidance - Recommend physical therapy to improve shoulder function and range of motion, contingent on willingness.  Follow-Up Instructions: No follow-ups on file.   Orders:  Orders Placed This Encounter  Procedures   Large Joint Inj   XR Cervical Spine 2 or 3 views   XR Shoulder Left   US  Guided Needle Placement - No Linked Charges   Ambulatory referral to Physical Therapy   No orders of the defined types were placed in this encounter.     Procedures: Large Joint Inj on 02/05/2024 9:33 AM Indications: pain Details: 22 G needle, ultrasound-guided  Arthrogram: No  Medications: 3 mL lidocaine  1 %; 3 mL bupivacaine  0.5 %; 40 mg methylPREDNISolone  acetate 40 MG/ML Outcome: tolerated well, no immediate complications Patient was prepped and draped in the usual sterile fashion.       Clinical Data: No additional findings.   Subjective: Chief Complaint  Patient presents with   Neck - Pain   Left Shoulder - Pain    HPI  Review of Systems  Constitutional: Negative.   HENT: Negative.    Eyes: Negative.   Respiratory: Negative.    Cardiovascular: Negative.   Gastrointestinal: Negative.   Endocrine: Negative.   Genitourinary: Negative.   Skin: Negative.   Allergic/Immunologic: Negative.   Neurological: Negative.   Hematological: Negative.   Psychiatric/Behavioral: Negative.    All other systems reviewed and are negative.  Objective: Vital Signs: There were no vitals taken for this visit.  Physical Exam Vitals and nursing note reviewed.  Constitutional:      Appearance: He is well-developed.  HENT:     Head: Normocephalic and atraumatic.  Eyes:     Pupils: Pupils are equal, round, and reactive to light.  Pulmonary:     Effort: Pulmonary effort is normal.  Abdominal:     Palpations: Abdomen is soft.  Musculoskeletal:        General: Normal  range of motion.     Cervical back: Neck supple.  Skin:    General: Skin is warm.  Neurological:     Mental Status: He is alert and oriented to person, place, and time.  Psychiatric:        Behavior: Behavior normal.        Thought Content: Thought content normal.        Judgment: Judgment normal.     Ortho Exam  Specialty Comments:  No specialty comments available.  Imaging: XR Cervical Spine 2 or 3 views Result Date: 02/05/2024 AP and lateral views of the cervical spine.  There is superior elevation of the medial clavicle.  No acute abnormalities.  XR Shoulder Left Result Date: 02/05/2024 X-rays of the left shoulder show no acute or structural abnormalities     PMFS History: Patient Active Problem List   Diagnosis Date Noted   PVD (peripheral vascular disease) (HCC) 02/26/2022   Arthritis of finger 12/01/2020   Primary osteoarthritis of right knee 12/01/2020   Trigger finger, right middle finger 10/05/2020   Toe pain, chronic, left 10/07/2018   Hyperlipidemia associated with type 2 diabetes mellitus (HCC) 09/19/2018   Hyperlipidemia 03/18/2018   Dyspnea on exertion 03/18/2018   Chest pain 03/18/2018   Diabetes mellitus without complication (HCC) 11/11/2017   Essential hypertension 11/11/2017   Past Medical History:  Diagnosis Date   COVID 04/20/2020   Diabetes mellitus without complication (HCC)    GERD (gastroesophageal reflux disease)    Hypertension    MRSA (methicillin resistant Staphylococcus aureus)     Family History  Problem Relation Age of Onset   Heart disease Mother    Hypertension Mother    Diabetes Sister    Hypertension Sister    Stroke Brother    Diabetes Brother    Heart disease Brother    Heart attack Brother    Hypertension Brother    Hypertension Father     Past Surgical History:  Procedure Laterality Date   BACK SURGERY     LAPAROSCOPIC GASTRIC BANDING     TRIGGER FINGER RELEASE Right 10/05/2020   Procedure: RELEASE TRIGGER  FINGER RIGHT LONG FINGER;  Surgeon: Wes Hamman, MD;  Location: [facility];  Service: Orthopedics;  Laterality: Right;   Social History   Occupational History   Not on file  Tobacco Use   Smoking status: Former    Current packs/day: 0.00    Types: Cigarettes    Start date: 3    Quit date: 2000    Years since quitting: 25.4   Smokeless tobacco: Never  Vaping Use   Vaping status: Never Used  Substance and Sexual Activity   Alcohol use: No   Drug use: No   Sexual activity: Yes

## 2024-02-18 ENCOUNTER — Other Ambulatory Visit: Payer: Self-pay

## 2024-02-18 ENCOUNTER — Other Ambulatory Visit: Payer: Self-pay | Admitting: Nurse Practitioner

## 2024-02-18 DIAGNOSIS — Z8719 Personal history of other diseases of the digestive system: Secondary | ICD-10-CM

## 2024-02-18 DIAGNOSIS — E119 Type 2 diabetes mellitus without complications: Secondary | ICD-10-CM

## 2024-02-18 DIAGNOSIS — I10 Essential (primary) hypertension: Secondary | ICD-10-CM

## 2024-02-18 MED ORDER — VALSARTAN 80 MG PO TABS
80.0000 mg | ORAL_TABLET | Freq: Every day | ORAL | 1 refills | Status: DC
  Filled 2024-02-18: qty 90, 90d supply, fill #0
  Filled 2024-05-18: qty 90, 90d supply, fill #1

## 2024-02-18 MED ORDER — GLIMEPIRIDE 4 MG PO TABS
4.0000 mg | ORAL_TABLET | Freq: Every day | ORAL | 1 refills | Status: DC
  Filled 2024-02-18: qty 90, 90d supply, fill #0
  Filled 2024-05-18: qty 90, 90d supply, fill #1

## 2024-02-18 MED ORDER — OMEPRAZOLE 20 MG PO CPDR
20.0000 mg | DELAYED_RELEASE_CAPSULE | Freq: Two times a day (BID) | ORAL | 1 refills | Status: AC
  Filled 2024-02-18: qty 180, 90d supply, fill #0
  Filled 2024-05-23: qty 180, 90d supply, fill #1

## 2024-02-19 ENCOUNTER — Other Ambulatory Visit: Payer: Self-pay

## 2024-03-18 ENCOUNTER — Other Ambulatory Visit: Payer: Self-pay

## 2024-03-18 ENCOUNTER — Other Ambulatory Visit: Payer: Self-pay | Admitting: Nurse Practitioner

## 2024-03-18 DIAGNOSIS — E1169 Type 2 diabetes mellitus with other specified complication: Secondary | ICD-10-CM

## 2024-03-18 MED ORDER — ATORVASTATIN CALCIUM 40 MG PO TABS
40.0000 mg | ORAL_TABLET | Freq: Every day | ORAL | 0 refills | Status: DC
  Filled 2024-03-18: qty 90, 90d supply, fill #0

## 2024-03-19 ENCOUNTER — Other Ambulatory Visit: Payer: Self-pay

## 2024-04-14 ENCOUNTER — Other Ambulatory Visit: Payer: Self-pay | Admitting: Nurse Practitioner

## 2024-04-14 DIAGNOSIS — I10 Essential (primary) hypertension: Secondary | ICD-10-CM

## 2024-04-15 ENCOUNTER — Other Ambulatory Visit: Payer: Self-pay

## 2024-04-15 MED ORDER — AMLODIPINE BESYLATE 10 MG PO TABS
10.0000 mg | ORAL_TABLET | Freq: Every day | ORAL | 0 refills | Status: DC
  Filled 2024-04-15: qty 90, 90d supply, fill #0

## 2024-04-17 ENCOUNTER — Other Ambulatory Visit: Payer: Self-pay

## 2024-04-20 ENCOUNTER — Other Ambulatory Visit: Payer: Self-pay

## 2024-05-18 ENCOUNTER — Other Ambulatory Visit: Payer: Self-pay

## 2024-05-18 ENCOUNTER — Other Ambulatory Visit: Payer: Self-pay | Admitting: Nurse Practitioner

## 2024-05-18 DIAGNOSIS — R350 Frequency of micturition: Secondary | ICD-10-CM

## 2024-05-18 DIAGNOSIS — N529 Male erectile dysfunction, unspecified: Secondary | ICD-10-CM

## 2024-05-18 MED ORDER — TADALAFIL 10 MG PO TABS
10.0000 mg | ORAL_TABLET | ORAL | 0 refills | Status: DC | PRN
  Filled 2024-05-18: qty 10, 30d supply, fill #0

## 2024-05-22 ENCOUNTER — Ambulatory Visit: Payer: Self-pay | Attending: Nurse Practitioner | Admitting: Nurse Practitioner

## 2024-05-22 ENCOUNTER — Other Ambulatory Visit: Payer: Self-pay

## 2024-05-22 ENCOUNTER — Encounter: Payer: Self-pay | Admitting: Nurse Practitioner

## 2024-05-22 VITALS — BP 162/82 | HR 97 | Resp 18 | Ht 67.0 in | Wt 196.0 lb

## 2024-05-22 DIAGNOSIS — Z7984 Long term (current) use of oral hypoglycemic drugs: Secondary | ICD-10-CM

## 2024-05-22 DIAGNOSIS — I10 Essential (primary) hypertension: Secondary | ICD-10-CM

## 2024-05-22 DIAGNOSIS — Z79899 Other long term (current) drug therapy: Secondary | ICD-10-CM

## 2024-05-22 DIAGNOSIS — E1169 Type 2 diabetes mellitus with other specified complication: Secondary | ICD-10-CM

## 2024-05-22 DIAGNOSIS — D649 Anemia, unspecified: Secondary | ICD-10-CM | POA: Diagnosis not present

## 2024-05-22 DIAGNOSIS — E119 Type 2 diabetes mellitus without complications: Secondary | ICD-10-CM

## 2024-05-22 DIAGNOSIS — N529 Male erectile dysfunction, unspecified: Secondary | ICD-10-CM

## 2024-05-22 DIAGNOSIS — E785 Hyperlipidemia, unspecified: Secondary | ICD-10-CM

## 2024-05-22 DIAGNOSIS — Z23 Encounter for immunization: Secondary | ICD-10-CM | POA: Diagnosis not present

## 2024-05-22 DIAGNOSIS — H9012 Conductive hearing loss, unilateral, left ear, with unrestricted hearing on the contralateral side: Secondary | ICD-10-CM

## 2024-05-22 DIAGNOSIS — Z1211 Encounter for screening for malignant neoplasm of colon: Secondary | ICD-10-CM

## 2024-05-22 DIAGNOSIS — M542 Cervicalgia: Secondary | ICD-10-CM | POA: Diagnosis not present

## 2024-05-22 MED ORDER — TADALAFIL 10 MG PO TABS
10.0000 mg | ORAL_TABLET | ORAL | 1 refills | Status: DC | PRN
  Filled 2024-05-22 – 2024-06-04 (×2): qty 10, 20d supply, fill #0
  Filled 2024-06-22: qty 10, 20d supply, fill #1

## 2024-05-22 MED ORDER — ATORVASTATIN CALCIUM 40 MG PO TABS
40.0000 mg | ORAL_TABLET | Freq: Every day | ORAL | 2 refills | Status: AC
  Filled 2024-05-22 – 2024-06-12 (×2): qty 90, 90d supply, fill #0
  Filled 2024-09-12: qty 90, 90d supply, fill #1

## 2024-05-22 MED ORDER — GLIMEPIRIDE 4 MG PO TABS
4.0000 mg | ORAL_TABLET | Freq: Every day | ORAL | 2 refills | Status: AC
  Filled 2024-05-22 – 2024-08-17 (×2): qty 90, 90d supply, fill #0

## 2024-05-22 MED ORDER — COVID-19 MRNA VACC (MODERNA) 50 MCG/0.5ML IM SUSY: 0.5000 mL | PREFILLED_SYRINGE | Freq: Once | INTRAMUSCULAR | 0 refills | Status: AC

## 2024-05-22 MED ORDER — JANUMET 50-1000 MG PO TABS
1.0000 | ORAL_TABLET | Freq: Two times a day (BID) | ORAL | 1 refills | Status: DC
  Filled 2024-05-22 – 2024-09-19 (×3): qty 180, 90d supply, fill #0

## 2024-05-22 MED ORDER — AMLODIPINE BESYLATE 10 MG PO TABS
10.0000 mg | ORAL_TABLET | Freq: Every day | ORAL | 2 refills | Status: AC
  Filled 2024-05-22 – 2024-07-11 (×2): qty 90, 90d supply, fill #0
  Filled 2024-10-10: qty 90, 90d supply, fill #1

## 2024-05-22 MED ORDER — VALSARTAN 160 MG PO TABS
160.0000 mg | ORAL_TABLET | Freq: Every day | ORAL | 2 refills | Status: AC
  Filled 2024-05-22: qty 90, 90d supply, fill #0
  Filled 2024-08-19: qty 90, 90d supply, fill #1

## 2024-05-22 NOTE — Progress Notes (Signed)
 Assessment & Plan:  Kevin [name] was seen today for hypertension.  Diagnoses and all orders for this visit:  Primary hypertension -     CMP14+EGFR -     amLODipine  (NORVASC ) 10 MG tablet; Take 1 tablet (10 mg total) by mouth daily. -     valsartan  (DIOVAN ) 160 MG tablet; Take 1 tablet (160 mg total) by mouth daily. Blood pressure remains elevated despite current valsartan  and amlodipine  regimen. - Increase valsartan  dosage from 80 mg to 160. - Continue amlodipine . - Monitor blood pressure at home and report via MyChart.   Hyperlipidemia associated with type 2 diabetes mellitus (HCC) -     atorvastatin  (LIPITOR) 40 MG tablet; Take 1 tablet (40 mg total) by mouth daily.  Diabetes mellitus without complication (HCC) -     CMP14+EGFR -     Hemoglobin A1c -     glimepiride  (AMARYL ) 4 MG tablet; Take 1 tablet (4 mg total) by mouth daily with breakfast. -     JANUMET  50-1000 MG tablet; Take 1 tablet by mouth 2 (two) times daily with a meal.  Need for influenza vaccination -     Flu vaccine trivalent PF, 6mos and older(Flulaval ,Afluria,Fluarix,Fluzone)  Colon cancer screening -     Cologuard Colorectal cancer screening Prefers non-invasive testing due to aversion to colonoscopies. Last colonoscopy 20 years ago. - Order Cologuard kit for colorectal cancer screening.   Low hemoglobin -     CBC with Differential/Platelet  Cervical pain (neck) -     Ambulatory referral to Orthopedic Surgery Chronic neck pain with previous injuries and surgeries. Seeks new orthopedic surgeon. - Refer to a new orthopedic surgeon for evaluation and management of neck pain.   Erectile dysfunction, unspecified erectile dysfunction type -     tadalafil  (CIALIS ) 10 MG tablet; Take 1 tablet (10 mg total) by mouth every other day as needed for erectile dysfunction. And urinary frequency  Conductive hearing loss of left ear with unrestricted hearing of right ear -     Ambulatory referral to ENT Left ear  hearing loss with cerumen impaction Long-standing hearing loss likely due to cerumen impaction. Previous irrigation attempts worsened condition. - Refer to ENT for evaluation and management of cerumen impaction.  Encounter for administration of COVID-19 vaccine -     COVID-19 mRNA vaccine (SPIKEVAX) syringe; Inject 0.5 mLs into the muscle once for 1 dose.n.   Patient has been counseled on age-appropriate routine health concerns for screening and prevention. These are reviewed and up-to-date. Referrals have been placed accordingly. Immunizations are up-to-date or declined.    Subjective:   Chief Complaint  Patient presents with   Hypertension   History of Present Illness Kevin [name] is a 58 year old male who presents for medication management and referral to an orthopedic surgeon.  He has hypertension managed with valsartan  and amlodipine . His home blood pressure readings are improved compared to clinic readings but remain above the normal range. He has previously used Toprol, Norvasc , and Accupril  for hypertension.  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 He has undergone seven or eight back surgeries, including fusions and screw placements. He has a neck injury that he  reports has progressively worsened, and he was told by a surgeon years ago that he 'needed' a triple fusion, but he chose not to proceed with surgery.  He describes a recent clavicle injury sustained while working under his car, resulting in significant pain and limited arm movement. He has a history of 'nubbins' growing on his clavicle and fingers, which he attributes to long-term wear and tear. He has previously  received a steroid injection for pain relief and is seeking another.    He has a history of hearing issues in his left ear, attributed to a blockage that occurred a year and a half ago. He has experienced progressive hearing loss in this ear and has not had it evaluated since an unsuccessful irrigation attempt years ago. No current use of home ear cleaning methods due to fear of worsening the condition.    Review of Systems  Constitutional:  Negative for fever, malaise/fatigue and weight loss.  HENT: Negative.  Negative for nosebleeds.   Eyes: Negative.  Negative for blurred vision, double vision and photophobia.  Respiratory: Negative.  Negative for cough and shortness of breath.   Cardiovascular: Negative.  Negative for chest pain, palpitations and leg swelling.  Gastrointestinal: Negative.  Negative for heartburn, nausea and vomiting.  Musculoskeletal:  Positive for back pain, joint pain, myalgias and neck pain.  Neurological:  Positive for weakness. Negative for dizziness, focal weakness, seizures and headaches.  Psychiatric/Behavioral:  Positive for depression. Negative for suicidal ideas.     Past Medical History:  Diagnosis Date   COVID 04/20/2020   Diabetes mellitus without complication (HCC)    GERD (gastroesophageal reflux disease)    Hypertension    MRSA (methicillin resistant Staphylococcus aureus)     Past Surgical History:  Procedure Laterality Date   BACK SURGERY     LAPAROSCOPIC GASTRIC BANDING     TRIGGER FINGER RELEASE Right 10/05/2020   Procedure: RELEASE TRIGGER FINGER RIGHT LONG FINGER;  Surgeon: Jerri Kay HERO, MD;  Location: [facility];  Service: Orthopedics;  Laterality: Right;    Family History  Problem Relation Age of Onset   Heart disease Mother    Hypertension Mother    Diabetes Sister    Hypertension Sister    Stroke Brother    Diabetes Brother    Heart disease Brother    Heart attack Brother    Hypertension Brother     Hypertension Father     Social History Reviewed with no changes to be made today.   Outpatient Medications Prior to Visit  Medication Sig Dispense Refill   Blood Glucose Monitoring Suppl (TRUE METRIX METER) w/Device KIT Use as instructed 1 kit 0   glucose blood (TRUE METRIX BLOOD GLUCOSE TEST) test strip Use as instructed. Check blood glucose level by fingerstick twice per day. 200 each 12   omeprazole  (PRILOSEC) 20 MG capsule Take 1 capsule (20 mg total) by mouth 2 (two) times daily before a meal. 180 capsule 1   TRUEplus Lancets 28G MISC Use as instructed. Check blood glucose level by fingerstick twice per day. 200 each 6   amLODipine  (NORVASC ) 10 MG tablet Take 1 tablet (10 mg total) by mouth daily. 90 tablet 0   atorvastatin  (LIPITOR) 40 MG tablet Take 1 tablet (40 mg total) by mouth daily. 90 tablet 0   glimepiride  (AMARYL ) 4 MG tablet Take 1 tablet (4 mg total) by mouth daily with breakfast. 90 tablet 1   JANUMET  50-1000 MG tablet Take 1 tablet by mouth  2 (two) times daily with a meal. 180 tablet 0   tadalafil  (CIALIS ) 10 MG tablet Take 1 tablet (10 mg total) by mouth every other day as needed for erectile dysfunction. And urinary frequency 10 tablet 0   valsartan  (DIOVAN ) 80 MG tablet Take 1 tablet (80 mg total) by mouth daily. 90 tablet 1   No facility-administered medications prior to visit.    Allergies  Allergen Reactions   Tramadol      seizure       Objective:    BP (!) 162/82 (BP Location: Left Arm, Patient Position: Sitting, Cuff Size: Normal)   Pulse 97   Resp 18   Ht 5' 7 (1.702 m)   Wt 196 lb (88.9 kg)   SpO2 97%   BMI 30.70 kg/m  Wt Readings from Last 3 Encounters:  05/22/24 196 lb (88.9 kg)  01/31/24 185 lb (83.9 kg)  11/19/23 196 lb 9.6 oz (89.2 kg)    Physical Exam Vitals and nursing note reviewed.  Constitutional:      Appearance: He is well-developed.  HENT:     Head: Normocephalic and atraumatic.  Cardiovascular:     Rate and Rhythm: Normal  rate and regular rhythm.     Heart sounds: Normal heart sounds. No murmur heard.    No friction rub. No gallop.  Pulmonary:     Effort: Pulmonary effort is normal. No tachypnea or respiratory distress.     Breath sounds: Normal breath sounds. No decreased breath sounds, wheezing, rhonchi or rales.  Chest:     Chest wall: No tenderness.  Abdominal:     General: Bowel sounds are normal.     Palpations: Abdomen is soft.  Musculoskeletal:        General: Normal range of motion.     Cervical back: Normal range of motion.  Skin:    General: Skin is warm and dry.  Neurological:     Mental Status: He is alert and oriented to person, place, and time.     Coordination: Coordination normal.  Psychiatric:        Behavior: Behavior normal. Behavior is cooperative.        Thought Content: Thought content normal.        Judgment: Judgment normal.          Patient has been counseled extensively about nutrition and exercise as well as the importance of adherence with medications and regular follow-up. The patient was given clear instructions to go to ER or return to medical center if symptoms don't improve, worsen or new problems develop. The patient verbalized understanding.   Follow-up: Return in about 6 months (around 11/19/2024).   Kevin LELON Servant, FNP-BC [facility] [place], [place] [phone]   05/22/2024, 12:29 PM

## 2024-05-23 LAB — CBC WITH DIFFERENTIAL/PLATELET
Basophils Absolute: 0.1 x10E3/uL (ref 0.0–0.2)
Basos: 1 %
EOS (ABSOLUTE): 0.2 x10E3/uL (ref 0.0–0.4)
Eos: 3 %
Hematocrit: 35.5 % — ABNORMAL LOW (ref 37.5–51.0)
Hemoglobin: 10.8 g/dL — ABNORMAL LOW (ref 13.0–17.7)
Immature Grans (Abs): 0 x10E3/uL (ref 0.0–0.1)
Immature Granulocytes: 0 %
Lymphocytes Absolute: 1.1 x10E3/uL (ref 0.7–3.1)
Lymphs: 20 %
MCH: 24.9 pg — ABNORMAL LOW (ref 26.6–33.0)
MCHC: 30.4 g/dL — ABNORMAL LOW (ref 31.5–35.7)
MCV: 82 fL (ref 79–97)
Monocytes Absolute: 0.5 x10E3/uL (ref 0.1–0.9)
Monocytes: 8 %
Neutrophils Absolute: 3.8 x10E3/uL (ref 1.4–7.0)
Neutrophils: 68 %
Platelets: 302 x10E3/uL (ref 150–450)
RBC: 4.33 x10E6/uL (ref 4.14–5.80)
RDW: 14.2 % (ref 11.6–15.4)
WBC: 5.6 x10E3/uL (ref 3.4–10.8)

## 2024-05-23 LAB — CMP14+EGFR
ALT: 14 IU/L (ref 0–44)
AST: 19 IU/L (ref 0–40)
Albumin: 4.8 g/dL (ref 3.8–4.9)
Alkaline Phosphatase: 78 IU/L (ref 44–121)
BUN/Creatinine Ratio: 12 (ref 9–20)
BUN: 12 mg/dL (ref 6–24)
Bilirubin Total: 0.5 mg/dL (ref 0.0–1.2)
CO2: 19 mmol/L — ABNORMAL LOW (ref 20–29)
Calcium: 9.8 mg/dL (ref 8.7–10.2)
Chloride: 101 mmol/L (ref 96–106)
Creatinine, Ser: 1.03 mg/dL (ref 0.76–1.27)
Globulin, Total: 2.9 g/dL (ref 1.5–4.5)
Glucose: 147 mg/dL — ABNORMAL HIGH (ref 70–99)
Potassium: 4.8 mmol/L (ref 3.5–5.2)
Sodium: 139 mmol/L (ref 134–144)
Total Protein: 7.7 g/dL (ref 6.0–8.5)
eGFR: 84 mL/min/1.73 (ref >59)

## 2024-05-23 LAB — HEMOGLOBIN A1C
Est. average glucose Bld gHb Est-mCnc: 134 mg/dL
Hgb A1c MFr Bld: 6.3 % — ABNORMAL HIGH (ref 4.8–5.6)

## 2024-05-25 ENCOUNTER — Encounter (INDEPENDENT_AMBULATORY_CARE_PROVIDER_SITE_OTHER): Payer: Self-pay

## 2024-05-26 ENCOUNTER — Other Ambulatory Visit: Payer: Self-pay

## 2024-05-27 ENCOUNTER — Ambulatory Visit: Payer: Self-pay | Admitting: Nurse Practitioner

## 2024-05-27 DIAGNOSIS — D649 Anemia, unspecified: Secondary | ICD-10-CM

## 2024-06-01 ENCOUNTER — Other Ambulatory Visit: Payer: Self-pay | Admitting: Physician Assistant

## 2024-06-01 DIAGNOSIS — M542 Cervicalgia: Secondary | ICD-10-CM

## 2024-06-04 ENCOUNTER — Other Ambulatory Visit: Payer: Self-pay

## 2024-06-12 ENCOUNTER — Other Ambulatory Visit: Payer: Self-pay

## 2024-06-15 ENCOUNTER — Other Ambulatory Visit: Payer: Self-pay | Admitting: Physician Assistant

## 2024-06-15 ENCOUNTER — Telehealth: Payer: Self-pay

## 2024-06-15 NOTE — Discharge Instructions (Signed)

## 2024-06-16 ENCOUNTER — Ambulatory Visit
Admission: RE | Admit: 2024-06-16 | Discharge: 2024-06-16 | Disposition: A | Discharge status: Home / Self Care | Source: Ambulatory Visit | Attending: Physician Assistant | Admitting: Physician Assistant

## 2024-06-16 ENCOUNTER — Other Ambulatory Visit (HOSPITAL_COMMUNITY): Payer: Self-pay

## 2024-06-16 ENCOUNTER — Other Ambulatory Visit: Payer: Self-pay

## 2024-06-16 ENCOUNTER — Ambulatory Visit
Admission: RE | Admit: 2024-06-16 | Discharge: 2024-06-16 | Disposition: A | Source: Ambulatory Visit | Attending: Physician Assistant | Admitting: Physician Assistant

## 2024-06-16 DIAGNOSIS — M542 Cervicalgia: Secondary | ICD-10-CM

## 2024-06-16 MED ORDER — DIAZEPAM 5 MG PO TABS
10.0000 mg | ORAL_TABLET | Freq: Once | ORAL | Status: AC
  Administered 2024-06-16: 10 mg via ORAL

## 2024-06-16 MED ORDER — ONDANSETRON HCL 4 MG/2ML IJ SOLN: 4.0000 mg | Freq: Once | INTRAMUSCULAR | Status: DC | PRN

## 2024-06-16 MED ORDER — IOPAMIDOL (ISOVUE-M 300) INJECTION 61%
10.0000 mL | Freq: Once | INTRAMUSCULAR | Status: AC
  Administered 2024-06-16: 10 mL via INTRATHECAL

## 2024-06-16 MED ORDER — MEPERIDINE HCL 50 MG/ML IJ SOLN: 50.0000 mg | Freq: Once | INTRAMUSCULAR | Status: DC | PRN

## 2024-07-05 ENCOUNTER — Emergency Department (HOSPITAL_COMMUNITY)
Admission: EM | Admit: 2024-07-05 | Discharge: 2024-07-05 | Disposition: A | Discharge status: Home / Self Care | Attending: Emergency Medicine | Admitting: Emergency Medicine

## 2024-07-05 ENCOUNTER — Other Ambulatory Visit: Payer: Self-pay

## 2024-07-05 ENCOUNTER — Encounter (HOSPITAL_COMMUNITY): Payer: Self-pay | Admitting: Emergency Medicine

## 2024-07-05 ENCOUNTER — Emergency Department (HOSPITAL_COMMUNITY)

## 2024-07-05 DIAGNOSIS — Z7984 Long term (current) use of oral hypoglycemic drugs: Secondary | ICD-10-CM | POA: Insufficient documentation

## 2024-07-05 DIAGNOSIS — I10 Essential (primary) hypertension: Secondary | ICD-10-CM | POA: Insufficient documentation

## 2024-07-05 DIAGNOSIS — R0602 Shortness of breath: Secondary | ICD-10-CM | POA: Diagnosis present

## 2024-07-05 DIAGNOSIS — Z79899 Other long term (current) drug therapy: Secondary | ICD-10-CM | POA: Insufficient documentation

## 2024-07-05 DIAGNOSIS — R0609 Other forms of dyspnea: Secondary | ICD-10-CM | POA: Insufficient documentation

## 2024-07-05 DIAGNOSIS — E119 Type 2 diabetes mellitus without complications: Secondary | ICD-10-CM | POA: Insufficient documentation

## 2024-07-05 LAB — I-STAT CHEM 8, ED
BUN: 7 mg/dL (ref 6–20)
Calcium, Ion: 1.13 mmol/L — ABNORMAL LOW (ref 1.15–1.40)
Chloride: 104 mmol/L (ref 98–111)
Creatinine, Ser: 1.1 mg/dL (ref 0.61–1.24)
Glucose, Bld: 153 mg/dL — ABNORMAL HIGH (ref 70–99)
HCT: 32 % — ABNORMAL LOW (ref 39.0–52.0)
Hemoglobin: 10.9 g/dL — ABNORMAL LOW (ref 13.0–17.0)
Potassium: 4.3 mmol/L (ref 3.5–5.1)
Sodium: 139 mmol/L (ref 135–145)
TCO2: 21 mmol/L — ABNORMAL LOW (ref 22–32)

## 2024-07-05 LAB — COMPREHENSIVE METABOLIC PANEL WITH GFR
ALT: 18 U/L (ref 0–44)
AST: 23 U/L (ref 15–41)
Albumin: 4.8 g/dL (ref 3.5–5.0)
Alkaline Phosphatase: 67 U/L (ref 38–126)
Anion gap: 9 (ref 5–15)
BUN: 8 mg/dL (ref 6–20)
CO2: 21 mmol/L — ABNORMAL LOW (ref 22–32)
Calcium: 9.7 mg/dL (ref 8.9–10.3)
Chloride: 106 mmol/L (ref 98–111)
Creatinine, Ser: 1.15 mg/dL (ref 0.61–1.24)
GFR, Estimated: >60 mL/min (ref >60)
Glucose, Bld: 158 mg/dL — ABNORMAL HIGH (ref 70–99)
Potassium: 4.2 mmol/L (ref 3.5–5.1)
Sodium: 135 mmol/L (ref 135–145)
Total Bilirubin: 0.4 mg/dL (ref 0.0–1.2)
Total Protein: 7.9 g/dL (ref 6.5–8.1)

## 2024-07-05 LAB — URINALYSIS, W/ REFLEX TO CULTURE (INFECTION SUSPECTED)
Bacteria, UA: NONE SEEN
Bilirubin Urine: NEGATIVE
Glucose, UA: 50 mg/dL — AB
Hgb urine dipstick: NEGATIVE
Ketones, ur: NEGATIVE mg/dL
Leukocytes,Ua: NEGATIVE
Nitrite: NEGATIVE
Protein, ur: NEGATIVE mg/dL
Specific Gravity, Urine: 1.013 (ref 1.005–1.030)
pH: 7 (ref 5.0–8.0)

## 2024-07-05 LAB — URINE DRUG SCREEN
Amphetamines: NEGATIVE
Barbiturates: NEGATIVE
Benzodiazepines: NEGATIVE
Cocaine: NEGATIVE
Fentanyl: NEGATIVE
Methadone Scn, Ur: NEGATIVE
Opiates: NEGATIVE
Tetrahydrocannabinol: POSITIVE — AB

## 2024-07-05 LAB — CBC WITH DIFFERENTIAL/PLATELET
Abs Immature Granulocytes: 0.02 K/uL (ref 0.00–0.07)
Basophils Absolute: 0.1 K/uL (ref 0.0–0.1)
Basophils Relative: 1 %
Eosinophils Absolute: 0.1 K/uL (ref 0.0–0.5)
Eosinophils Relative: 2 %
HCT: 33.9 % — ABNORMAL LOW (ref 39.0–52.0)
Hemoglobin: 9.9 g/dL — ABNORMAL LOW (ref 13.0–17.0)
Immature Granulocytes: 0 %
Lymphocytes Relative: 17 %
Lymphs Abs: 1.1 K/uL (ref 0.7–4.0)
MCH: 23.2 pg — ABNORMAL LOW (ref 26.0–34.0)
MCHC: 29.2 g/dL — ABNORMAL LOW (ref 30.0–36.0)
MCV: 79.4 fL — ABNORMAL LOW (ref 80.0–100.0)
Monocytes Absolute: 0.4 K/uL (ref 0.1–1.0)
Monocytes Relative: 7 %
Neutro Abs: 4.7 K/uL (ref 1.7–7.7)
Neutrophils Relative %: 73 %
Platelets: 274 K/uL (ref 150–400)
RBC: 4.27 MIL/uL (ref 4.22–5.81)
RDW: 15.8 % — ABNORMAL HIGH (ref 11.5–15.5)
WBC: 6.5 K/uL (ref 4.0–10.5)
nRBC: 0 % (ref 0.0–0.2)

## 2024-07-05 LAB — TROPONIN T, HIGH SENSITIVITY: Troponin T High Sensitivity: <15 ng/L (ref 0–19)

## 2024-07-05 LAB — PRO BRAIN NATRIURETIC PEPTIDE: Pro Brain Natriuretic Peptide: 85.5 pg/mL (ref <300.0)

## 2024-07-05 LAB — BLOOD GAS, VENOUS
Acid-base deficit: 1.4 mmol/L (ref 0.0–2.0)
Bicarbonate: 22.8 mmol/L (ref 20.0–28.0)
O2 Saturation: 84.4 %
Patient temperature: 37
pCO2, Ven: 36 mmHg — ABNORMAL LOW (ref 44–60)
pH, Ven: 7.41 (ref 7.25–7.43)
pO2, Ven: 49 mmHg — ABNORMAL HIGH (ref 32–45)

## 2024-07-05 LAB — MAGNESIUM: Magnesium: 1.7 mg/dL (ref 1.7–2.4)

## 2024-07-05 LAB — TSH: TSH: 1.8 u[IU]/mL (ref 0.350–4.500)

## 2024-07-05 MED ORDER — SODIUM CHLORIDE 0.9 % IV BOLUS
1000.0000 mL | Freq: Once | INTRAVENOUS | Status: AC
  Administered 2024-07-05: 1000 mL via INTRAVENOUS

## 2024-07-05 MED ORDER — IOHEXOL 350 MG/ML SOLN
80.0000 mL | Freq: Once | INTRAVENOUS | Status: AC | PRN
  Administered 2024-07-05: 80 mL via INTRAVENOUS

## 2024-07-05 NOTE — Discharge Instructions (Addendum)
 We evaluated you for your shortness of breath.  Your testing in the emergency department was reassuring.  Your heart testing including your CT scan and cardiac enzyme testing was reassuring.  We feel that it is safe to go home at this time but we would like you to follow-up closely with her cardiologist.  We have placed a referral to help expedite your follow-up appointment.  Please call them if you do not hear in the next 48 to 72 hours.  If you develop any new or worsening symptoms such as symptoms at rest, severe worsening pain, uncontrolled vomiting, fainting, or any other concerning symptoms, please return to the emergency department.

## 2024-07-05 NOTE — ED Provider Notes (Signed)
 [place] EMERGENCY DEPARTMENT AT [facility] Provider Note  CSN: [id number] Arrival date & time: 07/05/24 9166  Chief Complaint(s) Shortness of Breath  HPI Kevin [name] is a 58 y.o. male history of diabetes, GERD, hypertension, hyperlipidemia presenting with shortness of breath.  Reports a shortness of breath for 3 years, also reports some chest pain with exertion.  Denies any loss of consciousness.  Reports occasional palpitations.  Denies any leg swelling, recent travel, surgery, cough, fevers or chills.  He is worried that this could be due to his anemia.  No black or bloody stools.  No headaches.   Past Medical History Past Medical History:  Diagnosis Date   COVID 04/20/2020   Diabetes mellitus without complication (HCC)    GERD (gastroesophageal reflux disease)    Hypertension    MRSA (methicillin resistant Staphylococcus aureus)    Patient Active Problem List   Diagnosis Date Noted   PVD (peripheral vascular disease) 02/26/2022   Arthritis of finger 12/01/2020   Primary osteoarthritis of right knee 12/01/2020   Trigger finger, right middle finger 10/05/2020   Toe pain, chronic, left 10/07/2018   Hyperlipidemia associated with type 2 diabetes mellitus (HCC) 09/19/2018   Hyperlipidemia 03/18/2018   Dyspnea on exertion 03/18/2018   Chest pain 03/18/2018   Diabetes mellitus without complication (HCC) 11/11/2017   Essential hypertension 11/11/2017   Home Medication(s) Prior to Admission medications   Medication Sig Start Date End Date Taking? Authorizing Provider  amLODipine  (NORVASC ) 10 MG tablet Take 1 tablet (10 mg total) by mouth daily. 05/22/24   Fleming, Zelda W, NP  atorvastatin  (LIPITOR) 40 MG tablet Take 1 tablet (40 mg total) by mouth daily. 05/22/24   Fleming, Zelda W, NP  Blood Glucose Monitoring Suppl (TRUE METRIX METER) w/Device KIT Use as instructed 10/23/17   Fleming, Zelda W, NP  glimepiride  (AMARYL ) 4 MG tablet Take 1 tablet (4 mg total) by mouth  daily with breakfast. 05/22/24   Fleming, Zelda W, NP  glucose blood (TRUE METRIX BLOOD GLUCOSE TEST) test strip Use as instructed. Check blood glucose level by fingerstick twice per day. 11/19/23   Fleming, Zelda W, NP  JANUMET  50-1000 MG tablet Take 1 tablet by mouth 2 (two) times daily with a meal. 05/22/24   Theotis Haze ORN, NP  omeprazole  (PRILOSEC) 20 MG capsule Take 1 capsule (20 mg total) by mouth 2 (two) times daily before a meal. 02/18/24   Theotis Haze ORN, NP  tadalafil  (CIALIS ) 10 MG tablet Take 1 tablet (10 mg total) by mouth every other day as needed for erectile dysfunction. And urinary frequency 05/22/24   Theotis Haze ORN, NP  TRUEplus Lancets 28G MISC Use as instructed. Check blood glucose level by fingerstick twice per day. 11/19/23   Fleming, Zelda W, NP  valsartan  (DIOVAN ) 160 MG tablet Take 1 tablet (160 mg total) by mouth daily. 05/22/24   Fleming, Zelda W, NP  Past Surgical History Past Surgical History:  Procedure Laterality Date   BACK SURGERY     LAPAROSCOPIC GASTRIC BANDING     TRIGGER FINGER RELEASE Right 10/05/2020   Procedure: RELEASE TRIGGER FINGER RIGHT LONG FINGER;  Surgeon: Jerri Kay HERO, MD;  Location: [facility];  Service: Orthopedics;  Laterality: Right;   Family History Family History  Problem Relation Age of Onset   Heart disease Mother    Hypertension Mother    Diabetes Sister    Hypertension Sister    Stroke Brother    Diabetes Brother    Heart disease Brother    Heart attack Brother    Hypertension Brother    Hypertension Father     Social History Social History   Tobacco Use   Smoking status: Former    Current packs/day: 0.00    Types: Cigarettes    Start date: 1985    Quit date: 2000    Years since quitting: 25.8   Smokeless tobacco: Never  Vaping Use   Vaping status: Never Used  Substance  Use Topics   Alcohol use: No   Drug use: No   Allergies Tramadol   Review of Systems Review of Systems  All other systems reviewed and are negative.   Physical Exam Vital Signs  I have reviewed the triage vital signs BP (!) 157/92 (BP Location: Right Arm)   Pulse 91   Temp 98 F (36.7 C) (Oral)   Resp 15   SpO2 99%  Physical Exam Vitals and nursing note reviewed.  Constitutional:      General: He is not in acute distress.    Appearance: Normal appearance.  HENT:     Mouth/Throat:     Mouth: Mucous membranes are moist.  Eyes:     Conjunctiva/sclera: Conjunctivae normal.  Cardiovascular:     Rate and Rhythm: Regular rhythm. Tachycardia present.  Pulmonary:     Effort: Pulmonary effort is normal. No tachypnea or respiratory distress.     Breath sounds: Normal breath sounds.  Abdominal:     General: Abdomen is flat.     Palpations: Abdomen is soft.     Tenderness: There is no abdominal tenderness.  Musculoskeletal:     Right lower leg: No edema.     Left lower leg: No edema.  Skin:    General: Skin is warm and dry.     Capillary Refill: Capillary refill takes less than 2 seconds.  Neurological:     Mental Status: He is alert and oriented to person, place, and time. Mental status is at baseline.  Psychiatric:        Mood and Affect: Mood normal.        Behavior: Behavior normal.     ED Results and Treatments Labs (all labs ordered are listed, but only abnormal results are displayed) Labs Reviewed  COMPREHENSIVE METABOLIC PANEL WITH GFR - Abnormal; Notable for the following components:      Result Value   CO2 21 (*)    Glucose, Bld 158 (*)    All other components within normal limits  CBC WITH DIFFERENTIAL/PLATELET - Abnormal; Notable for the following components:   Hemoglobin 9.9 (*)    HCT 33.9 (*)    MCV 79.4 (*)    MCH 23.2 (*)    MCHC 29.2 (*)    RDW 15.8 (*)    All other components within normal limits  URINALYSIS, W/ REFLEX TO CULTURE (INFECTION  SUSPECTED) - Abnormal; Notable for the following  components:   Glucose, UA 50 (*)    All other components within normal limits  URINE DRUG SCREEN - Abnormal; Notable for the following components:   Tetrahydrocannabinol POSITIVE (*)    All other components within normal limits  BLOOD GAS, VENOUS - Abnormal; Notable for the following components:   pCO2, Ven 36 (*)    pO2, Ven 49 (*)    All other components within normal limits  I-STAT CHEM 8, ED - Abnormal; Notable for the following components:   Glucose, Bld 153 (*)    Calcium , Ion 1.13 (*)    TCO2 21 (*)    Hemoglobin 10.9 (*)    HCT 32.0 (*)    All other components within normal limits  PRO BRAIN NATRIURETIC PEPTIDE  MAGNESIUM  TSH  TROPONIN T, HIGH SENSITIVITY  TROPONIN T, HIGH SENSITIVITY                                                                                                                          Radiology No results found.  Pertinent labs & imaging results that were available during my care of the patient were reviewed by me and considered in my medical decision making (see MDM for details).  Medications Ordered in ED Medications  sodium chloride  0.9 % bolus 1,000 mL (0 mLs Intravenous Stopped 07/05/24 1311)  iohexol (OMNIPAQUE) 350 MG/ML injection 80 mL (80 mLs Intravenous Contrast Given 07/05/24 1131)                                                                                                                                     Procedures Procedures  (including critical care time)  Medical Decision Making / ED Course   MDM:  58 year old presenting to the emergency department shortness of breath.  Patient is overall well-appearing, vitals are notable for tachycardia.  Physical exam and otherwise unremarkable.  Lungs seem clear on exam, no leg swelling, no JVD.  Given tachycardia, exertional dyspnea, shortness of breath concern for possible PE, will check PE study.  Differential includes CHF,  although patient clinically appears euvolemic, will check BNP.  He does have chest pain, seems to be more chronic but will check troponin.  Consider also pneumonia, pneumothorax will evaluate further on imaging.  Will check labs to evaluate for worsening anemia or other metabolic process.  Will reassess.  Clinical Course as of 07/06/24 1844  [place]  Jul 05, 2024  1300 Workup notable for mild anemia. Otherwise no sign of PE, CHF, acs, significant metabolic abnormality. Low concern for dangerous process. Patient is stable for discharge. HR improved with fluids. Will discharge patient to home. All questions answered. Patient comfortable with plan of discharge. Return precautions discussed with patient and specified on the after visit summary.  [WS]    Clinical Course User Index [WS] Francesca Elsie CROME, MD     Additional history obtained:  -External records from outside source obtained and reviewed including: Chart review including previous notes, labs, imaging, consultation notes including prior notes    Lab Tests: -I ordered, reviewed, and interpreted labs.   The pertinent results include:   Labs Reviewed  COMPREHENSIVE METABOLIC PANEL WITH GFR - Abnormal; Notable for the following components:      Result Value   CO2 21 (*)    Glucose, Bld 158 (*)    All other components within normal limits  CBC WITH DIFFERENTIAL/PLATELET - Abnormal; Notable for the following components:   Hemoglobin 9.9 (*)    HCT 33.9 (*)    MCV 79.4 (*)    MCH 23.2 (*)    MCHC 29.2 (*)    RDW 15.8 (*)    All other components within normal limits  URINALYSIS, W/ REFLEX TO CULTURE (INFECTION SUSPECTED) - Abnormal; Notable for the following components:   Glucose, UA 50 (*)    All other components within normal limits  URINE DRUG SCREEN - Abnormal; Notable for the following components:   Tetrahydrocannabinol POSITIVE (*)    All other components within normal limits  BLOOD GAS, VENOUS - Abnormal; Notable for the  following components:   pCO2, Ven 36 (*)    pO2, Ven 49 (*)    All other components within normal limits  I-STAT CHEM 8, ED - Abnormal; Notable for the following components:   Glucose, Bld 153 (*)    Calcium , Ion 1.13 (*)    TCO2 21 (*)    Hemoglobin 10.9 (*)    HCT 32.0 (*)    All other components within normal limits  PRO BRAIN NATRIURETIC PEPTIDE  MAGNESIUM  TSH  TROPONIN T, HIGH SENSITIVITY  TROPONIN T, HIGH SENSITIVITY    Notable for mild anemia  EKG   EKG Interpretation Date/Time:  Sunday July 05 2024 08:49:04 EDT Ventricular Rate:  136 PR Interval:  120 QRS Duration:  95 QT Interval:  281 QTC Calculation: 418 R Axis:   15  Text Interpretation: Sinus tachycardia Multiform ventricular premature complexes Probable left atrial enlargement Low voltage, precordial leads Borderline ST depression, diffuse leads Confirmed by Francesca Elsie ([id number]) on 07/05/2024 9:51:17 AM         Imaging Studies ordered: I ordered imaging studies including CTA chest On my interpretation imaging demonstrates no pe I independently visualized and interpreted imaging. I agree with the radiologist interpretation   Medicines ordered and prescription drug management: Meds ordered this encounter  Medications   sodium chloride  0.9 % bolus 1,000 mL   iohexol (OMNIPAQUE) 350 MG/ML injection 80 mL    -I have reviewed the patients home medicines and have made adjustments as needed    Social Determinants of Health:  Diagnosis or treatment significantly limited by social determinants of health: THC use   Reevaluation: After the interventions noted above, I reevaluated the patient and found that their symptoms have improved  Co morbidities that complicate the patient evaluation  Past Medical History:  Diagnosis Date   COVID 04/20/2020  Diabetes mellitus without complication (HCC)    GERD (gastroesophageal reflux disease)    Hypertension    MRSA (methicillin resistant  Staphylococcus aureus)       Dispostion: Disposition decision including need for hospitalization was considered, and patient discharged from emergency department.    Final Clinical Impression(s) / ED Diagnoses Final diagnoses:  Dyspnea on exertion     This chart was dictated using voice recognition software.  Despite best efforts to proofread,  errors can occur which can change the documentation meaning.    Francesca Elsie CROME, MD 07/06/24 [phone]

## 2024-07-05 NOTE — ED Triage Notes (Signed)
 Pt reports chest pain and SHOB for a very long time.

## 2024-07-11 ENCOUNTER — Other Ambulatory Visit: Payer: Self-pay | Admitting: Nurse Practitioner

## 2024-07-11 ENCOUNTER — Other Ambulatory Visit: Payer: Self-pay

## 2024-07-11 DIAGNOSIS — N529 Male erectile dysfunction, unspecified: Secondary | ICD-10-CM

## 2024-07-12 ENCOUNTER — Other Ambulatory Visit: Payer: Self-pay

## 2024-07-13 ENCOUNTER — Other Ambulatory Visit: Payer: Self-pay

## 2024-07-13 ENCOUNTER — Encounter: Payer: Self-pay | Admitting: Radiology

## 2024-07-13 MED ORDER — TADALAFIL 10 MG PO TABS
10.0000 mg | ORAL_TABLET | ORAL | 2 refills | Status: DC | PRN
  Filled 2024-07-13: qty 15, 30d supply, fill #0

## 2024-07-13 NOTE — Telephone Encounter (Signed)
 Requested Prescriptions  Pending Prescriptions Disp Refills   tadalafil  (CIALIS ) 10 MG tablet 15 tablet 2    Sig: Take 1 tablet (10 mg total) by mouth every other day as needed for erectile dysfunction. And urinary frequency     Urology: Erectile Dysfunction Agents Failed - 07/13/2024  5:58 PM      Failed - Last BP in normal range    BP Readings from Last 1 Encounters:  07/05/24 (!) 157/92         Passed - AST in normal range and within 360 days    AST  Date Value Ref Range Status  07/05/2024 23 15 - 41 U/L Final         Passed - ALT in normal range and within 360 days    ALT  Date Value Ref Range Status  07/05/2024 18 0 - 44 U/L Final         Passed - Valid encounter within last 12 months    Recent Outpatient Visits           1 month ago Primary hypertension   [facility] [place]. [facility] Theotis Haze ORN, NP   7 months ago Diabetes mellitus treated with oral medication [facility])   [facility] [place]. [facility] Theotis Haze ORN, NP   11 months ago Primary hypertension   [facility] [place] - [facility] Dept Of [place]. [facility] Theotis Haze ORN, NP   1 year ago Primary hypertension   [facility] [place] [facility] Of [place]. [facility] [place], [place] W, NP   1 year ago Diastolic blood pressure 90 mm Hg or higher   [facility] Of [place]. [facility] Theotis Haze ORN, NP       Future Appointments             In 4 months Theotis Haze ORN, NP [facility] [place] - [facility] Dept Of [place]. [facility], [place]

## 2024-07-14 ENCOUNTER — Other Ambulatory Visit: Payer: Self-pay

## 2024-07-14 ENCOUNTER — Other Ambulatory Visit (INDEPENDENT_AMBULATORY_CARE_PROVIDER_SITE_OTHER): Payer: Self-pay | Admitting: Physician Assistant

## 2024-07-14 DIAGNOSIS — H9319 Tinnitus, unspecified ear: Secondary | ICD-10-CM

## 2024-07-15 ENCOUNTER — Ambulatory Visit: Admitting: Cardiovascular Disease

## 2024-07-16 ENCOUNTER — Ambulatory Visit (INDEPENDENT_AMBULATORY_CARE_PROVIDER_SITE_OTHER): Admitting: Audiology

## 2024-07-16 ENCOUNTER — Ambulatory Visit (INDEPENDENT_AMBULATORY_CARE_PROVIDER_SITE_OTHER): Admitting: Physician Assistant

## 2024-07-16 ENCOUNTER — Encounter (INDEPENDENT_AMBULATORY_CARE_PROVIDER_SITE_OTHER): Payer: Self-pay | Admitting: Physician Assistant

## 2024-07-16 VITALS — BP 186/103 | HR 94 | Temp 98.4°F | Ht 67.0 in | Wt 185.0 lb

## 2024-07-16 DIAGNOSIS — H903 Sensorineural hearing loss, bilateral: Secondary | ICD-10-CM

## 2024-07-16 DIAGNOSIS — H6122 Impacted cerumen, left ear: Secondary | ICD-10-CM | POA: Diagnosis not present

## 2024-07-16 NOTE — Progress Notes (Signed)
  [address] [place], [place] [address] [phone]  Audiological Evaluation    Name: Kevin [name]     DOB:   10/18/1965      MRN:   [id number]                                                                                     Service Date: 07/16/2024     Accompanied by: unaccompanied   Patient comes today after [name], PA-C sent a referral for a hearing evaluation due to concerns with ear fullness.   Symptoms Yes Details  Hearing loss  []    Tinnitus  []    Ear pain/ infections/pressure  [x]  Left clogged sensation   Balance problems  []    Noise exposure history  []    Previous ear surgeries  []    Family history of hearing loss  []    Amplification  []    Other  []      Otoscopy: Right ear: Clear external ear canal and notable landmarks visualized on the tympanic membrane. Left ear:  Non-occluding cerumen, able to visualize some tympanic membrane landmarks.  Tympanometry: Right ear: Normal external ear canal volume with normal middle ear pressure and tympanic membrane compliance (Type A). Findings are suggestive of normal middle ear function. Left ear: Normal external ear canal volume with normal middle ear pressure and tympanic membrane compliance (Type A). Findings are suggestive of normal middle ear function.   Hearing Evaluation The hearing test results were completed under headphones and results are deemed to be of good reliability. Test technique:  conventional    Pure tone Audiometry: Both ears: normal hearing from [phone], then mild presumably sensorineural hearing loss at 8000 Hz.   Speech Audiometry: Right ear- Speech Reception Threshold (SRT) was obtained at 10 dBHL. Left ear-Speech Reception Threshold (SRT) was obtained at 10 dBHL.   Word Recognition Score Tested using NU-6 (recorded) Right ear: 100% was obtained at a presentation level of 50 dBHL with contralateral masking which is deemed as  excellent. Left ear: 100% was obtained at  a presentation level of 50 dBHL with contralateral masking which is deemed as  excellent.   Impression: There is not a significant difference in pure-tone thresholds between ears. There is not a significant difference in the word recognition score in between ears.    Recommendations: Follow up with ENT as scheduled for today. Return for a hearing evaluation if concerns with hearing changes arise or per MD recommendation.   [name] MARIE LEROUX-MARTINEZ, AUD

## 2024-07-16 NOTE — Progress Notes (Addendum)
 Dear Dr. Theotis, Here is my assessment for our mutual patient, Kevin [name]. Thank you for allowing me the opportunity to care for your patient. Please do not hesitate to contact me should you have any other questions. Sincerely, Chyrl Cohen PA-C  Otolaryngology Clinic Note Referring provider: Dr. Theotis HPI:  Raudel Bazen is a 58 y.o. male kindly referred by Dr. Theotis   Discussed the use of AI scribe software for clinical note transcription with the patient, who gave verbal consent to proceed.  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  He has a lifelong history of needing ear irrigation every five to ten years due to earwax buildup. Approximately seven to eight years ago, he experienced a complete blockage in his left ear, which was partially relieved by a procedure at [facility], but he continued to feel blocked afterward.  Two years ago, he had another complete blockage and sought care at [facility] urgent care, where irrigation with cold water was attempted but was ineffective and caused discomfort. He was discharged with ear drops, which provided some relief over a few days, but he remains with a sensation of blockage similar to eight years ago.  He avoids water in his ears due to fear of blockage, recalling an incident in the [place] where water exposure led to a blocked ear and a misdiagnosed infection. His primary care doctor later confirmed it was wax-related and resolved it in the office.  The left ear is primarily affected. He avoids inserting anything into his ears, including Q-tips and drops, due to past advice and personal experience.  No history of ear infections or other ear problems during childhood. Currently, no pain, drainage, or feeling of congestion in the ears. He reports a past nasal injury and wears dentures on the top and bottom, though he only uses the top set.            Independent Review of Additional Tests or Records:  Audiological evaluation 07/16/2024     PMH/Meds/All/SocHx/FamHx/ROS:   Past Medical History:  Diagnosis Date   COVID 04/20/2020   Diabetes mellitus without complication (HCC)    GERD (gastroesophageal reflux disease)    Hypertension    MRSA (methicillin resistant Staphylococcus aureus)      Past Surgical History:  Procedure Laterality Date   BACK SURGERY     LAPAROSCOPIC GASTRIC BANDING     TRIGGER FINGER RELEASE Right 10/05/2020   Procedure: RELEASE TRIGGER FINGER RIGHT LONG FINGER;  Surgeon: Jerri Kay HERO, MD;  Location: [facility];  Service: Orthopedics;  Laterality: Right;    Family History  Problem Relation Age of Onset   Heart disease Mother    Hypertension Mother    Diabetes Sister    Hypertension Sister    Stroke Brother    Diabetes Brother    Heart disease Brother    Heart attack Brother    Hypertension Brother    Hypertension Father      Social Connections: Socially Isolated (07/22/2023)   Social Connection and Isolation Panel    Frequency of Communication with Friends and Family: Twice a week    Frequency of Social Gatherings with Friends and Family: More than three times a week    Attends Religious Services: Never    Database Administrator or Organizations: No    Attends Banker Meetings: Never    Marital Status: Divorced  Current Outpatient Medications:    amLODipine  (NORVASC ) 10 MG tablet, Take 1 tablet (10 mg total) by mouth daily., Disp: 90 tablet, Rfl: 2   atorvastatin  (LIPITOR) 40 MG tablet, Take 1 tablet (40 mg total) by mouth daily., Disp: 90 tablet, Rfl: 2   Blood Glucose Monitoring Suppl (TRUE METRIX METER) w/Device KIT, Use as instructed, Disp: 1 kit, Rfl: 0   glimepiride  (AMARYL ) 4 MG tablet, Take 1 tablet (4 mg total) by mouth daily with breakfast., Disp: 90 tablet, Rfl: 2   glucose blood (TRUE METRIX BLOOD GLUCOSE TEST) test strip, Use as instructed.  Check blood glucose level by fingerstick twice per day., Disp: 200 each, Rfl: 12   JANUMET  50-1000 MG tablet, Take 1 tablet by mouth 2 (two) times daily with a meal., Disp: 180 tablet, Rfl: 1   omeprazole  (PRILOSEC) 20 MG capsule, Take 1 capsule (20 mg total) by mouth 2 (two) times daily before a meal., Disp: 180 capsule, Rfl: 1   tadalafil  (CIALIS ) 10 MG tablet, Take 1 tablet (10 mg total) by mouth every other day as needed for erectile dysfunction. And urinary frequency, Disp: 15 tablet, Rfl: 2   TRUEplus Lancets 28G MISC, Use as instructed. Check blood glucose level by fingerstick twice per day., Disp: 200 each, Rfl: 6   valsartan  (DIOVAN ) 160 MG tablet, Take 1 tablet (160 mg total) by mouth daily., Disp: 90 tablet, Rfl: 2   Physical Exam:   BP (!) 186/103   Pulse 94   Temp 98.4 F (36.9 C)   Ht 5' 7 (1.702 m)   Wt 185 lb (83.9 kg)   SpO2 97%   BMI 28.98 kg/m   Pertinent Findings  CN II-XII grossly intact Left EAC with cerumen impaction, right EAC clear with TM intact with well-pneumatized middle ear space Anterior rhinoscopy: Septum left deviation bilateral inferior turbinates with no hypertrophy No lesions of oral cavity/oropharynx; upper dentures No obviously palpable neck masses/lymphadenopathy/thyromegaly No respiratory distress or stridor  Seprately Identifiable Procedures:  Procedure: bilateral ear microscopy and cerumen removal using microscope (CPT 30789) - Mod 25 Pre-procedure diagnosis: unilateral cerumen impaction left external auditory canal Post-procedure diagnosis: same Indication: bilateral cerumen impaction; given patient's otologic complaints and history as well as for improved and comprehensive examination of external ear and tympanic membrane, bilateral otologic examination using microscope was performed and impacted cerumen removed  Procedure: Patient was placed semi-recumbent. Both ear canals were examined using the microscope with findings above. Cerumen  removed from the left external auditory canal using suction and currette with improvement in EAC examination and patency. Left: EAC was patent. TM was intact . Middle ear was aerated. Drainage: none Right: EAC was patent. TM was intact . Middle ear was aerated . Drainage: none Patient tolerated the procedure well.   Impression & Plans:  Eamon Tantillo is a 58 y.o. male with the following   Assessment and Plan    Left cerumen impaction   - Scheduled follow-up in six months for ear cleaning. - Advised against ear drops or Q-tips. - Instructed to contact ENT if wax buildup occurs before follow-up.  - Continue blood pressure management with primary care provider          - f/u 6 months   Thank you for allowing me the opportunity to care for your patient. Please do not hesitate to contact me should you have any other questions.  Sincerely, Chyrl Cohen PA-C [facility] Phone: [phone] Fax: [phone]  07/16/2024, 2:42 PM

## 2024-07-16 NOTE — Progress Notes (Signed)
 Patient having BP managed. Patient took BP medication around 6AM-7AM this morning. Patient has white coat syndrome, and a general anxiety of being in the doctors office.

## 2024-07-22 ENCOUNTER — Encounter: Payer: Self-pay | Admitting: Audiology

## 2024-07-23 NOTE — Progress Notes (Signed)
 REFERRING PHYSICIAN:  Referral, Self  PROVIDER:  ERIC DARALYN BLUSH, MD  MRN: [id number] DOB: 1965/11/21 DATE OF ENCOUNTER: 07/23/2024  Subjective     Chief Complaint: New Weight Loss (New Bariatric, pt had lap band placed 04/2005 in [place] , and followed up in 2013 in [place]. Pt is having issues and feels like the band is the cause.)    Reason for consult: Kevin [name] is a 58 y.o. male who is seen today as an office consultation at the request of Dr. Referral for evaluation of New Weight Loss (New Bariatric, pt had lap band placed 04/2005 in [place] , and followed up in 2013 in [place]. Pt is having issues and feels like the band is the cause.) .    History of Present Illness Kevin [name] is a 58 year old male with a history of lap band placement who presents with complications related to the lap band.  He had a lap band placed in 2006 in [place] , initially weighing 234 pounds. Three months post-surgery, he developed gastritis after crushing and ingesting Advil , which has been a persistent issue. He uses omeprazole  daily to manage the gastritis.  In 2012, he experienced a lap band slip while in [place], which was managed by removing all the fluid from the band. The slip resolved over the next month or two. A year later, fluid was reintroduced to the band in [facility], and it was adjusted to about 40-50% closed. He has not seen a doctor for the lap band since then.  He reports symptoms of iron deficiency anemia, including shortness of breath, which worsened significantly in May-June 2025. He was hospitalized for shortness of breath a few weeks ago, where a heart workup was performed. He started taking iron supplements, B12, vitamin C, folic acid, and a men's multivitamin five weeks ago, which improved his symptoms, including vision issues.  He experiences frequent regurgitation, especially if he eats too quickly or takes one bite too many. He can regurgitate food at  will and often feels heartburn or reflux, which is controlled with omeprazole . He also reports a nighttime cough if he eats late.  He had a colonoscopy in 2010 due to anemia, which showed no significant findings. He does not currently have a local GI doctor. He takes medication for blood pressure and diabetes. No unintentional weight loss, significant changes in stool consistency or color, or pain with swallowing.      Review of Systems: A complete review of systems was obtained from the patient.  I have reviewed this information and discussed as appropriate with the patient.  See HPI as well for other ROS.  ROS    Medical History: Past Medical History:  Diagnosis Date  . Anemia   . Diabetes mellitus without complication (CMS/HHS-HCC)   . GERD (gastroesophageal reflux disease)   . Hypertension   . MRSA (methicillin resistant Staphylococcus aureus)     Patient Active Problem List  Diagnosis  . Diabetes mellitus without complication (CMS/HHS-HCC)  . Essential hypertension  . Hyperlipidemia associated with type 2 diabetes mellitus (CMS/HHS-HCC)    Past Surgical History:  Procedure Laterality Date  . Trigger finger release (Right)  10/05/2020  . SABRALaparoscopic gastric banding    . Back surgery       Allergies  Allergen Reactions  . Tramadol  Other (See Comments)    seizure  tramadol     Current Outpatient Medications on File Prior to Visit  Medication Sig Dispense Refill  . glimepiride  (AMARYL ) 4  MG tablet Take 4 mg by mouth once daily    . LIPITOR 40 mg tablet Take 40 mg by mouth once daily    . NORVASC  10 mg tablet Take 10 mg by mouth once daily    . omeprazole  (PRILOSEC) 20 MG DR capsule Take 20 mg by mouth 2 (two) times daily before meals    . SITagliptin -metFORMIN  (JANUMET ) 50-1,000 mg tablet Take 1 tablet by mouth 2 (two) times daily    . tadalafiL  (CIALIS ) 10 MG tablet Take 10 mg by mouth once daily    . valsartan  (DIOVAN ) 160 MG tablet Take 160 mg by mouth once  daily     No current facility-administered medications on file prior to visit.    Family History  Problem Relation Age of Onset  . Heart disease Mother   . High blood pressure (Hypertension) Mother   . High blood pressure (Hypertension) Father   . Diabetes Sister   . High blood pressure (Hypertension) Sister   . Heart disease Brother   . Myocardial Infarction (Heart attack) Brother   . Stroke Brother   . Diabetes Brother   . High blood pressure (Hypertension) Brother      Social History   Tobacco Use  Smoking Status Former  . Types: Cigarettes  Smokeless Tobacco Never     Social History   Socioeconomic History  . Marital status: Divorced  Tobacco Use  . Smoking status: Former    Types: Cigarettes  . Smokeless tobacco: Never  Substance and Sexual Activity  . Alcohol use: Never  . Drug use: Never  . Sexual activity: Defer   Social Drivers of Health   Financial Resource Strain: Low Risk  (07/22/2023)   Received from [facility]   Overall Financial Resource Strain (CARDIA)   . Difficulty of Paying Living Expenses: Not hard at all  Food Insecurity: No Food Insecurity (07/22/2023)   Received from [facility]   Hunger Vital Sign   . Within the past 12 months, you worried that your food would run out before you got the money to buy more.: Never true   . Within the past 12 months, the food you bought just didn't last and you didn't have money to get more.: Never true  Transportation Needs: No Transportation Needs (07/22/2023)   Received from [facility] - Transportation   . Lack of Transportation (Medical): No   . Lack of Transportation (Non-Medical): No  Physical Activity: Inactive (07/22/2023)   Received from [facility]   Exercise Vital Sign   . On average, how many days per week do you engage in moderate to strenuous exercise (like a brisk walk)?: 0 days   . On average, how many minutes do you engage in exercise at this level?: 0 min  Stress: No  Stress Concern Present (07/22/2023)   Received from [facility] - Occupational Stress Questionnaire   . Feeling of Stress : Not at all  Social Connections: Socially Isolated (07/22/2023)   Received from [facility]   Social Connection and Isolation Panel   . In a typical week, how many times do you talk on the phone with family, friends, or neighbors?: Twice a week   . How often do you get together with friends or relatives?: More than three times a week   . How often do you attend church or religious services?: Never   . Do you belong to any clubs or organizations such  as church groups, unions, fraternal or athletic groups, or school groups?: No   . How often do you attend meetings of the clubs or organizations you belong to?: Never   . Are you married, widowed, divorced, separated, never married, or living with a partner?: Divorced    Objective:    Vitals:   07/23/24 0945  BP: (!) 168/96  Pulse: (!) 112  Temp: 36.9 C (98.5 F)  TempSrc: Temporal  SpO2: 97%  Weight: 89.4 kg (197 lb)  Height: 170.2 cm (5' 7)  PainSc: 0-No pain    Body mass index is 30.85 kg/m.  PE Chaperone note: No sensitive exam performed  Constitutional: NAD; conversant; no deformities Eyes: Moist conjunctiva; no lid lag; anicteric; PERRL Neck: Trachea midline; no thyromegaly Lungs: Normal respiratory effort; no tactile fremitus CV: RRR; no palpable thrills; no pitting edema GI: Abd soft, nontender, nondistended, all trocar scars, palpable Lap-Band port in left mid abdomen; no palpable hepatosplenomegaly MSK: Normal gait; no clubbing/cyanosis Psychiatric: Appropriate affect; alert and oriented x3 Lymphatic: No palpable cervical or axillary lymphadenopathy Skin: No rash, lesions or jaundice    Labs, Imaging and Diagnostic Testing: PCP note May 22, 2024 Comprehensive metabolic panel, CBC July 05, 2024   Hemoglobin A1c 6.3  CTA chest  07/05/24 PULMONARY ARTERIES: Pulmonary arteries are adequately opacified for evaluation. No pulmonary embolism. Main pulmonary artery is normal in caliber.   MEDIASTINUM: The heart demonstrates coronary artery calcifications and no acute abnormality. The pericardium demonstrates no acute abnormality. There is no acute abnormality of the thoracic aorta, which demonstrates atherosclerotic calcifications. Patulous/dilated esophagus likely reflects chronic dysmotility in the setting of gastric banding. There are 2 dorsal column stimulators identified terminating in the mid to lower THORACIC canal.   LYMPH NODES: No mediastinal, hilar or axillary lymphadenopathy.   LUNGS AND PLEURA: There are a few scattered lung nodules identified in both lungs. The index nodule within the subpleural aspect of the posterior left lower lobe measures 5 mm, image 101/13. This is unchanged from the previous exam, compatible with a benign nodule. Subpleural nodule overlying the right middle lobe is stable measuring 3 mm, image 79/13. No follow-up imaging recommended. No focal consolidation or pulmonary edema. No pleural effusion or pneumothorax.   UPPER ABDOMEN: A gastric band is in place. No acute abnormality within the imaged portions of the upper abdomen.   SOFT TISSUES AND BONES: No acute bone or soft tissue abnormality.   IMPRESSION: 1. No pulmonary embolism. 2. Aortic atherosclerosis and coronary artery calcifications 3. 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  I personally reviewed the CT of his chest.  The band has the appropriate orientation but there is dilation of the esophagus and pouch above the band so I think the band is too tight.  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 Recent aspiration of 3.7 cc of fluid from the band  due to excessive tightness causing dilation of the stomach and esophagus. No evidence of band slippage on recent CT; however there appears to be dilation of the pouch above the gastric band as well as esophageal dilation.  He expresses desire to remove the band due to ongoing complications and side effects. Discussed potential for weight gain post-removal and possible improvement in reflux symptoms. Risks of esophageal dysmotility and band erosion discussed. He is ready to proceed with removal after insurance coverage is confirmed. - Because he has evidence of a dilated esophagus and proximal gastric pouch and frequent heartburn and regurgitation I recommended removing all the fluid from his Lap-Band today  -after obtaining informed consent the abdomen was prepped and I accessed the Lap-Band with the first attempt and we aspirated and removed all fluid from the Lap-Band approximately 3.7 cc.  He tolerated the procedure well we aspirated aspirated fluid from lap band to relieve mechanical restriction. - Discussed insurance coverage for lap band removal with insurance navigator. - Will consider lap band removal after insurance coverage is confirmed. - Continue using eating techniques to manage mechanical restriction.  Iron deficiency anemia in the setting of bariatric surgery Iron deficiency anemia likely  secondary to malabsorption post-bariatric surgery. Symptoms include shortness of breath and visual disturbances, which have improved with iron supplementation. He has been taking iron supplements, B12, C, folic acid, and a men's multivitamin for the past five weeks with noted improvement in symptoms. - Continue iron supplementation, B12, C, folic acid, and multivitamin. - Ordered vitamin level checks to assess nutritional status. - Patient was encouraged to proceed with gastroenterology establishment to consider upper and lower endoscopies to evaluate his anemia. - It is potential he could have some Cameron lesions in the gastric pouch above the band there are other potential sources for his anemia and therefore I stressed him the importance of establishing care with gastroenterology  Gastroesophageal reflux disease (GERD) following bariatric surgery GERD symptoms controlled with omeprazole . Symptoms include heartburn and occasional nighttime cough, exacerbated by late meals. No evidence of band slippage contributing to reflux. - Continue omeprazole  for GERD management.   This patient encounter took 45 minutes today to perform the following: take history, perform exam, review outside records, interpret imaging, counsel the patient on their diagnosis and document encounter, findings & plan in the EHR  No follow-ups on file.  This note has been created using automated tools and reviewed for accuracy by ERIC MCADAMS WILSON.  ERIC DARALYN BLUSH, MD  General, Minimally Invasive, & Bariatric Surgery

## 2024-07-26 ENCOUNTER — Emergency Department (HOSPITAL_COMMUNITY)

## 2024-07-26 ENCOUNTER — Other Ambulatory Visit: Payer: Self-pay

## 2024-07-26 ENCOUNTER — Emergency Department (HOSPITAL_COMMUNITY)
Admission: EM | Admit: 2024-07-26 | Discharge: 2024-07-26 | Disposition: A | Discharge status: Home / Self Care | Attending: Emergency Medicine | Admitting: Emergency Medicine

## 2024-07-26 DIAGNOSIS — K921 Melena: Secondary | ICD-10-CM | POA: Diagnosis present

## 2024-07-26 DIAGNOSIS — K922 Gastrointestinal hemorrhage, unspecified: Secondary | ICD-10-CM | POA: Diagnosis not present

## 2024-07-26 LAB — COMPREHENSIVE METABOLIC PANEL WITH GFR
ALT: 21 U/L (ref 0–44)
AST: 25 U/L (ref 15–41)
Albumin: 4.7 g/dL (ref 3.5–5.0)
Alkaline Phosphatase: 73 U/L (ref 38–126)
Anion gap: 14 (ref 5–15)
BUN: 11 mg/dL (ref 6–20)
CO2: 22 mmol/L (ref 22–32)
Calcium: 9.3 mg/dL (ref 8.9–10.3)
Chloride: 102 mmol/L (ref 98–111)
Creatinine, Ser: 1.13 mg/dL (ref 0.61–1.24)
GFR, Estimated: >60 mL/min (ref >60)
Glucose, Bld: 152 mg/dL — ABNORMAL HIGH (ref 70–99)
Potassium: 4.1 mmol/L (ref 3.5–5.1)
Sodium: 138 mmol/L (ref 135–145)
Total Bilirubin: 0.4 mg/dL (ref 0.0–1.2)
Total Protein: 7.7 g/dL (ref 6.5–8.1)

## 2024-07-26 LAB — CBC
HCT: 32.1 % — ABNORMAL LOW (ref 39.0–52.0)
Hemoglobin: 10.1 g/dL — ABNORMAL LOW (ref 13.0–17.0)
MCH: 24.8 pg — ABNORMAL LOW (ref 26.0–34.0)
MCHC: 31.5 g/dL (ref 30.0–36.0)
MCV: 78.9 fL — ABNORMAL LOW (ref 80.0–100.0)
Platelets: 297 K/uL (ref 150–400)
RBC: 4.07 MIL/uL — ABNORMAL LOW (ref 4.22–5.81)
RDW: 18.4 % — ABNORMAL HIGH (ref 11.5–15.5)
WBC: 7 K/uL (ref 4.0–10.5)
nRBC: 0 % (ref 0.0–0.2)

## 2024-07-26 LAB — TYPE AND SCREEN
ABO/RH(D): A POS
Antibody Screen: NEGATIVE

## 2024-07-26 LAB — CBG MONITORING, ED: Glucose-Capillary: 149 mg/dL — ABNORMAL HIGH (ref 70–99)

## 2024-07-26 MED ORDER — ONDANSETRON HCL 4 MG/2ML IJ SOLN
4.0000 mg | Freq: Once | INTRAMUSCULAR | Status: AC
  Administered 2024-07-26: 4 mg via INTRAVENOUS
  Filled 2024-07-26: qty 2

## 2024-07-26 MED ORDER — IOHEXOL 300 MG/ML  SOLN
100.0000 mL | Freq: Once | INTRAMUSCULAR | Status: AC | PRN
  Administered 2024-07-26: 100 mL via INTRAVENOUS

## 2024-07-26 MED ORDER — ACETAMINOPHEN 500 MG PO TABS
1000.0000 mg | ORAL_TABLET | Freq: Once | ORAL | Status: AC
  Administered 2024-07-26: 1000 mg via ORAL
  Filled 2024-07-26: qty 2

## 2024-07-26 MED ORDER — PANTOPRAZOLE SODIUM 40 MG IV SOLR
40.0000 mg | Freq: Once | INTRAVENOUS | Status: AC
  Administered 2024-07-26: 40 mg via INTRAVENOUS
  Filled 2024-07-26: qty 10

## 2024-07-26 MED ORDER — DIAZEPAM 5 MG/ML IJ SOLN
2.5000 mg | Freq: Once | INTRAMUSCULAR | Status: AC
  Administered 2024-07-26: 2.5 mg via INTRAVENOUS
  Filled 2024-07-26: qty 2

## 2024-07-26 NOTE — ED Triage Notes (Signed)
 Patient to ED for increased weakness and rectal bleeding. Patient states he has had symptoms for years but was recently seen by doctors who told him to get tested fro rectal cancer. He has lap band.

## 2024-07-26 NOTE — ED Provider Notes (Signed)
 [place] EMERGENCY DEPARTMENT AT [facility] Provider Note   CSN: [id number] Arrival date & time: 07/26/24  1559     Patient presents with: Rectal Bleeding and Weakness   Kevin [name] is a 58 y.o. male.  With a history of lap gastric band placement who presents to the ED for GI bleeding.  Patient reports intermittent bloody stools ongoing since earlier this spring.  Blood-streaked stools today.  No frank blood.  Some nausea and epigastric abdominal pain.  No vomiting.  No fevers chills.    Rectal Bleeding Weakness Associated symptoms: hematochezia        Prior to Admission medications   Medication Sig Start Date End Date Taking? Authorizing Provider  amLODipine  (NORVASC ) 10 MG tablet Take 1 tablet (10 mg total) by mouth daily. 05/22/24   Theotis Haze ORN, NP  atorvastatin  (LIPITOR) 40 MG tablet Take 1 tablet (40 mg total) by mouth daily. 05/22/24   Fleming, Zelda W, NP  Blood Glucose Monitoring Suppl (TRUE METRIX METER) w/Device KIT Use as instructed 10/23/17   Fleming, Zelda W, NP  glimepiride  (AMARYL ) 4 MG tablet Take 1 tablet (4 mg total) by mouth daily with breakfast. 05/22/24   Theotis Haze ORN, NP  glucose blood (TRUE METRIX BLOOD GLUCOSE TEST) test strip Use as instructed. Check blood glucose level by fingerstick twice per day. 11/19/23   Fleming, Zelda W, NP  JANUMET  50-1000 MG tablet Take 1 tablet by mouth 2 (two) times daily with a meal. 05/22/24   Theotis Haze ORN, NP  omeprazole  (PRILOSEC) 20 MG capsule Take 1 capsule (20 mg total) by mouth 2 (two) times daily before a meal. 02/18/24   Fleming, Zelda W, NP  tadalafil  (CIALIS ) 10 MG tablet Take 1 tablet (10 mg total) by mouth every other day as needed for erectile dysfunction. And urinary frequency 07/13/24   Theotis Haze ORN, NP  TRUEplus Lancets 28G MISC Use as instructed. Check blood glucose level by fingerstick twice per day. 11/19/23   Fleming, Zelda W, NP  valsartan  (DIOVAN ) 160 MG tablet Take 1 tablet (160 mg  total) by mouth daily. 05/22/24   Theotis Haze ORN, NP    Allergies: Tramadol     Review of Systems  Gastrointestinal:  Positive for hematochezia.  Neurological:  Positive for weakness.    Updated Vital Signs BP (!) 134/90   Pulse 86   Temp 98 F (36.7 C) (Oral)   Resp 17   Ht 5' 7 (1.702 m)   Wt 88 kg   SpO2 98%   BMI 30.38 kg/m   Physical Exam Vitals and nursing note reviewed.  HENT:     Head: Normocephalic and atraumatic.  Eyes:     Pupils: Pupils are equal, round, and reactive to light.  Cardiovascular:     Rate and Rhythm: Normal rate and regular rhythm.  Pulmonary:     Effort: Pulmonary effort is normal.     Breath sounds: Normal breath sounds.  Abdominal:     Palpations: Abdomen is soft.     Tenderness: There is abdominal tenderness.     Comments: Left lower quadrant tenderness  Skin:    General: Skin is warm and dry.  Neurological:     Mental Status: He is alert.  Psychiatric:        Mood and Affect: Mood normal.     (all labs ordered are listed, but only abnormal results are displayed) Labs Reviewed  COMPREHENSIVE METABOLIC PANEL WITH GFR - Abnormal; Notable for the following  components:      Result Value   Glucose, Bld 152 (*)    All other components within normal limits  CBC - Abnormal; Notable for the following components:   RBC 4.07 (*)    Hemoglobin 10.1 (*)    HCT 32.1 (*)    MCV 78.9 (*)    MCH 24.8 (*)    RDW 18.4 (*)    All other components within normal limits  CBG MONITORING, ED - Abnormal; Notable for the following components:   Glucose-Capillary 149 (*)    All other components within normal limits  POC OCCULT BLOOD, ED  TYPE AND SCREEN  ABO/RH    EKG: EKG Interpretation Date/Time:  Sunday July 26 2024 16:12:52 EST Ventricular Rate:  123 PR Interval:  134 QRS Duration:  82 QT Interval:  315 QTC Calculation: 451 R Axis:   11  Text Interpretation: Sinus tachycardia Multiform ventricular premature complexes Consider  anterior infarct Confirmed by Pamella Sharper [phone]) on 07/26/2024 8:26:48 PM  Radiology: CT ABDOMEN PELVIS W CONTRAST Result Date: 07/26/2024 EXAM: CT ABDOMEN AND PELVIS WITH CONTRAST 07/26/2024 07:03:01 PM TECHNIQUE: CT of the abdomen and pelvis was performed with the administration of 100 mL iohexol (OMNIPAQUE) 300 MG/ML solution. Multiplanar reformatted images are provided for review. Automated exposure control, iterative reconstruction, and/or weight-based adjustment of the mA/kV was utilized to reduce the radiation dose to as low as reasonably achievable. COMPARISON: 06/30/2019 CLINICAL HISTORY: abd pain \\T \ bloody stools, s/p lap band FINDINGS: LOWER CHEST: Scattered coronary calcifications. 6 mm pleural based nodule, lateral left lower lobe, previously 4 mm. 5 mm subpleural nodule, posterior right lower lobe (5:26) more conspicuous than on prior exam. According to the Fleischner Society pulmonary nodule recommendations, the finding is consistent with a solitary solid nodule 6-8 mm and the recommendation is: for low-risk patients, CT at 6-12 months, then consider CT at 18-24 months; for high-risk patients, CT at 6-12 months, then CT at 18-24 months. LIVER: The liver is unremarkable. GALLBLADDER AND BILE DUCTS: Gallbladder is unremarkable. No biliary ductal dilatation. SPLEEN: Scattered punctate calcified granulomas in the spleen, normal in size. PANCREAS: No acute abnormality. ADRENAL GLANDS: No acute abnormality. KIDNEYS, URETERS AND BLADDER: No stones in the kidneys or ureters. No hydronephrosis. No perinephric or periureteral stranding. Urinary bladder is unremarkable. Mild prostate enlargement. GI AND BOWEL: Stable gastric lap band apparatus. Stomach demonstrates no acute abnormality. There is no bowel obstruction. PERITONEUM AND RETROPERITONEUM: No ascites. No free air. VASCULATURE: Aorta is normal in caliber. Moderate scattered calcified aortoiliac plaque without aneurysm. LYMPH NODES: Prominent  central mesenteric lymph nodes, none greater than 1 cm short axis diameter. REPRODUCTIVE ORGANS: No acute abnormality. BONES AND SOFT TISSUES: Implantable stimulator device in the posterior right flank with catheters extending into the epidural space of the mid thoracic spine as before. Stable instrumented fusion L5-S1. Mild degenerative disc disease L4-L5 with vacuum phenomenon progressive since prior study. No acute osseous abnormality. IMPRESSION: 1. Stable gastric lap band apparatus. 2. No acute findings in the abdomen or pelvis. 3. Progressive adjacent level degenerative disc disease L4 L5. Electronically signed by: Dayne Hassell MD 07/26/2024 07:18 PM EST RP Workstation: [id number]     Procedures   Medications Ordered in the ED  ondansetron  (ZOFRAN ) injection 4 mg (4 mg Intravenous Given 07/26/24 1655)  pantoprazole (PROTONIX) injection 40 mg (40 mg Intravenous Given 07/26/24 1655)  diazepam  (VALIUM ) injection 2.5 mg (2.5 mg Intravenous Given 07/26/24 1657)  iohexol (OMNIPAQUE) 300 MG/ML solution 100 mL (100 mLs Intravenous  Contrast Given 07/26/24 1903)  acetaminophen  (TYLENOL ) tablet 1,000 mg (1,000 mg Oral Given 07/26/24 1935)    Clinical Course as of 07/26/24 2131  Sun Jul 26, 2024  2130 Laboratory workup consistent for known history of iron deficiency anemia.  CT abdomen pelvis shows stable Lap-Band no other acute findings.  No other laboratory abnormalities.  Patient will follow-up with GI as outpatient. [MP]    Clinical Course User Index [MP] Pamella Ozell LABOR, DO                                 Medical Decision Making 58 year old male with history as above presented to ED for abdominal pain bloody stools.  Bloody stools been going on for at least 6 months intermittently.  Increased abdominal pain nausea today.  He did show me blood-streaked stools from earlier today with a picture on his phone.  Has local surgeon to follow-up with regarding history of Lap-Band but no established  GI care.  Hemodynamically stable exam notable for left lower quadrant tenderness.  Differential diagnosis includes: Acute intra-abdominal infectious/inflammatory process such as appendicitis, diverticulitis, pancreatitis and cholecystitis Gastric ulcer Marginal ulcer Adhesion Viral gastroenteritis  Will obtain laboratory workup including CBC with differential, metabolic panel, lipase, type and screen and urinalysis along with CT abdomen pelvis  Amount and/or Complexity of Data Reviewed Labs: ordered. Radiology: ordered.  Risk OTC drugs. Prescription drug management.        Final diagnoses:  Gastrointestinal hemorrhage, unspecified gastrointestinal hemorrhage type    ED Discharge Orders     None          Pamella Ozell LABOR, DO 07/26/24 2131

## 2024-07-26 NOTE — Discharge Instructions (Signed)
 You were seen in the emergency room given concern for GI bleeding Your blood work showed iron deficiency anemia which you have a history of There was no findings on the CAT scan Your abdominal pain and bleeding may be due to iron supplementation Please discuss this with your primary doctor Please follow-up with a GI specialist at [facility] gastroenterology for further evaluation as you may need a colonoscopy if the bleeding persists Return to the emergency room for severe pain or any other concerns

## 2024-07-27 ENCOUNTER — Encounter: Payer: Self-pay | Admitting: Gastroenterology

## 2024-07-27 ENCOUNTER — Encounter: Payer: Self-pay | Admitting: Nurse Practitioner

## 2024-07-28 ENCOUNTER — Encounter: Payer: Self-pay | Admitting: Nurse Practitioner

## 2024-07-28 ENCOUNTER — Ambulatory Visit: Attending: Nurse Practitioner | Admitting: Nurse Practitioner

## 2024-07-28 VITALS — BP 157/81 | HR 112 | Resp 18 | Ht 67.0 in | Wt 191.8 lb

## 2024-07-28 DIAGNOSIS — Z09 Encounter for follow-up examination after completed treatment for conditions other than malignant neoplasm: Secondary | ICD-10-CM | POA: Diagnosis not present

## 2024-07-28 DIAGNOSIS — R1084 Generalized abdominal pain: Secondary | ICD-10-CM

## 2024-07-28 NOTE — Patient Instructions (Signed)
 [place] [facility] Located in: [place] [facility] [address] Address: [address], [place], [place] [address] Phone: [phone]

## 2024-07-28 NOTE — Progress Notes (Signed)
 Assessment & Plan:  Kevin [name] was seen today for hospitalization follow-up.  Diagnoses and all orders for this visit:  Hospital discharge follow-up I was able to get him an appt in December with GI  Generalized abdominal pain Instructed to be see urgently if pain persists.   Patient has been counseled on age-appropriate routine health concerns for screening and prevention. These are reviewed and up-to-date. Referrals have been placed accordingly. Immunizations are up-to-date or declined.    Subjective:   Chief Complaint  Patient presents with   Hospitalization Follow-up   History of Present Illness Kevin [name] is a 58 year old male with anemia and possible gastrointestinal bleeding who presents with worsening abdominal pain and difficulty breathing. He was recently evaluated in the ED on 07-26-2024 and noted for stable CBC and CT abd and pelvis with no acute GI findings. He was instructed to follow up with GI and has an appt scheduled for January however today he states he feels the appointment is too far away and he believes he has colon cancer and is going to die before his appointment with GI.  He has declined referrals for colonoscopies since becoming a patient of mine. Cologuard was sent several months ago however this has not been returned for resulting. Last FIT test was 2024 and negative.   He reports experiencing what he calls gastrointestinal bleeding for over a year, with symptoms worsening significantly in recent months. The patient reports persistent gastrointestinal bleeding, however his hemoglobin levels have remained stable at 10. Severe abdominal pain has become constant and more intense over the past few days, whereas it used to be intermittent.  He has a history of anemia, initially he attributed this to lap band surgery and iron deficiency but was not taking any OTC iron. Despite starting iron supplements a few weeks ago, his symptoms have not improved. Abdominal pain  and gastrointestinal symptoms, including changes in stool consistency and appearance, have persisted for over a year. He describes 'spaghetti curls' and 'pellets' in his stool, with recent development of narrower stools.  He has a history of polyps discovered during a colonoscopy in 2010, for which follow-up screening was advised in one year but not pursued. He is concerned that current symptoms may be related to these polyps or a more serious condition like colon cancer, especially given his family history of similar issues.  He experiences significant difficulty breathing and fatigue, stating that he cannot walk or move around the house without becoming winded. Symptoms are progressively worsening, with today being worse than yesterday. He also experiences headaches and high blood pressure.  He reports being unable to eat properly and has lost weight, although he is unsure of the exact amount and weight appears stable based on our records. He wants to find the cause of his bleeding and abdominal pain, as he feels he cannot continue living with his current symptoms. He believes the CT that was ordered was not read correctly.   Blood pressure is significantly elevated today.      Review of Systems  Constitutional: Negative.  Negative for fever, malaise/fatigue and weight loss.  Eyes: Negative.  Negative for blurred vision, double vision and photophobia.  Respiratory: Negative.  Negative for cough and shortness of breath.   Cardiovascular: Negative.  Negative for chest pain, palpitations and leg swelling.  Gastrointestinal:  Positive for abdominal pain and blood in stool. Negative for heartburn, nausea and vomiting.  Genitourinary: Negative.   Musculoskeletal:  Positive for back pain.  Neurological:  Positive for headaches. Negative for dizziness, focal weakness and seizures.  Psychiatric/Behavioral:  Negative for suicidal ideas. The patient is nervous/anxious.     Past Medical History:   Diagnosis Date   COVID 04/20/2020   Diabetes mellitus without complication (HCC)    GERD (gastroesophageal reflux disease)    Hypertension    MRSA (methicillin resistant Staphylococcus aureus)     Past Surgical History:  Procedure Laterality Date   BACK SURGERY     LAPAROSCOPIC GASTRIC BANDING     TRIGGER FINGER RELEASE Right 10/05/2020   Procedure: RELEASE TRIGGER FINGER RIGHT LONG FINGER;  Surgeon: Jerri Kay HERO, MD;  Location: [facility];  Service: Orthopedics;  Laterality: Right;    Family History  Problem Relation Age of Onset   Heart disease Mother    Hypertension Mother    Diabetes Sister    Hypertension Sister    Stroke Brother    Diabetes Brother    Heart disease Brother    Heart attack Brother    Hypertension Brother    Hypertension Father     Social History Reviewed with no changes to be made today.   Outpatient Medications Prior to Visit  Medication Sig Dispense Refill   amLODipine  (NORVASC ) 10 MG tablet Take 1 tablet (10 mg total) by mouth daily. 90 tablet 2   atorvastatin  (LIPITOR) 40 MG tablet Take 1 tablet (40 mg total) by mouth daily. 90 tablet 2   Blood Glucose Monitoring Suppl (TRUE METRIX METER) w/Device KIT Use as instructed 1 kit 0   glimepiride  (AMARYL ) 4 MG tablet Take 1 tablet (4 mg total) by mouth daily with breakfast. 90 tablet 2   glucose blood (TRUE METRIX BLOOD GLUCOSE TEST) test strip Use as instructed. Check blood glucose level by fingerstick twice per day. 200 each 12   JANUMET  50-1000 MG tablet Take 1 tablet by mouth 2 (two) times daily with a meal. 180 tablet 1   omeprazole  (PRILOSEC) 20 MG capsule Take 1 capsule (20 mg total) by mouth 2 (two) times daily before a meal. 180 capsule 1   tadalafil  (CIALIS ) 10 MG tablet Take 1 tablet (10 mg total) by mouth every other day as needed for erectile dysfunction. And urinary frequency 15 tablet 2   TRUEplus Lancets 28G MISC Use as instructed. Check blood glucose level by fingerstick  twice per day. 200 each 6   valsartan  (DIOVAN ) 160 MG tablet Take 1 tablet (160 mg total) by mouth daily. 90 tablet 2   No facility-administered medications prior to visit.    Allergies  Allergen Reactions   Tramadol      seizure         Objective:    BP (!) 157/81 (BP Location: Left Arm, Patient Position: Sitting, Cuff Size: Normal)   Pulse (!) 112   Resp 18   Ht 5' 7 (1.702 m)   Wt 191 lb 12.8 oz (87 kg)   SpO2 98%   BMI 30.04 kg/m  Wt Readings from Last 3 Encounters:  07/28/24 191 lb 12.8 oz (87 kg)  07/26/24 194 lb (88 kg)  07/16/24 185 lb (83.9 kg)    Physical Exam Vitals and nursing note reviewed.  Constitutional:      Appearance: He is well-developed.  HENT:     Head: Normocephalic and atraumatic.  Cardiovascular:     Rate and Rhythm: Normal rate and regular rhythm.     Heart sounds: Normal heart sounds. No murmur heard.    No friction rub. No  gallop.  Pulmonary:     Effort: Pulmonary effort is normal. No tachypnea or respiratory distress.     Breath sounds: Normal breath sounds. No decreased breath sounds, wheezing, rhonchi or rales.  Chest:     Chest wall: No tenderness.  Abdominal:     General: Bowel sounds are normal.  Musculoskeletal:        General: Normal range of motion.     Cervical back: Normal range of motion.  Skin:    General: Skin is warm and dry.  Neurological:     Mental Status: He is alert and oriented to person, place, and time.     Coordination: Coordination normal.  Psychiatric:        Behavior: Behavior normal. Behavior is cooperative.        Thought Content: Thought content normal.        Judgment: Judgment normal.          Patient has been counseled extensively about nutrition and exercise as well as the importance of adherence with medications and regular follow-up. The patient was given clear instructions to go to ER or return to medical center if symptoms don't improve, worsen or new problems develop. The patient  verbalized understanding.   Follow-up: Return if symptoms worsen or fail to improve.   Haze LELON Servant, FNP-BC [facility] [place], [place] [phone]   07/28/2024, 12:14 PM

## 2024-07-30 ENCOUNTER — Emergency Department (HOSPITAL_COMMUNITY)
Admission: EM | Admit: 2024-07-30 | Discharge: 2024-07-30 | Disposition: A | Discharge status: Home / Self Care | Attending: Emergency Medicine | Admitting: Emergency Medicine

## 2024-07-30 ENCOUNTER — Emergency Department (HOSPITAL_COMMUNITY)

## 2024-07-30 DIAGNOSIS — R634 Abnormal weight loss: Secondary | ICD-10-CM | POA: Insufficient documentation

## 2024-07-30 DIAGNOSIS — F488 Other specified nonpsychotic mental disorders: Secondary | ICD-10-CM | POA: Diagnosis not present

## 2024-07-30 DIAGNOSIS — R000 Tachycardia, unspecified: Secondary | ICD-10-CM | POA: Insufficient documentation

## 2024-07-30 DIAGNOSIS — K219 Gastro-esophageal reflux disease without esophagitis: Secondary | ICD-10-CM | POA: Diagnosis not present

## 2024-07-30 DIAGNOSIS — R112 Nausea with vomiting, unspecified: Secondary | ICD-10-CM | POA: Diagnosis not present

## 2024-07-30 DIAGNOSIS — Z7984 Long term (current) use of oral hypoglycemic drugs: Secondary | ICD-10-CM | POA: Diagnosis not present

## 2024-07-30 DIAGNOSIS — R0602 Shortness of breath: Secondary | ICD-10-CM | POA: Diagnosis not present

## 2024-07-30 DIAGNOSIS — R1013 Epigastric pain: Secondary | ICD-10-CM | POA: Insufficient documentation

## 2024-07-30 DIAGNOSIS — K921 Melena: Secondary | ICD-10-CM | POA: Diagnosis not present

## 2024-07-30 DIAGNOSIS — Z79899 Other long term (current) drug therapy: Secondary | ICD-10-CM | POA: Insufficient documentation

## 2024-07-30 DIAGNOSIS — I10 Essential (primary) hypertension: Secondary | ICD-10-CM | POA: Diagnosis not present

## 2024-07-30 DIAGNOSIS — R109 Unspecified abdominal pain: Secondary | ICD-10-CM | POA: Diagnosis present

## 2024-07-30 DIAGNOSIS — E119 Type 2 diabetes mellitus without complications: Secondary | ICD-10-CM | POA: Insufficient documentation

## 2024-07-30 LAB — POC OCCULT BLOOD, ED: Fecal Occult Bld: NEGATIVE

## 2024-07-30 LAB — CBC
HCT: 32.3 % — ABNORMAL LOW (ref 39.0–52.0)
Hemoglobin: 9.9 g/dL — ABNORMAL LOW (ref 13.0–17.0)
MCH: 24.4 pg — ABNORMAL LOW (ref 26.0–34.0)
MCHC: 30.7 g/dL (ref 30.0–36.0)
MCV: 79.8 fL — ABNORMAL LOW (ref 80.0–100.0)
Platelets: 270 K/uL (ref 150–400)
RBC: 4.05 MIL/uL — ABNORMAL LOW (ref 4.22–5.81)
RDW: 18.9 % — ABNORMAL HIGH (ref 11.5–15.5)
WBC: 6 K/uL (ref 4.0–10.5)
nRBC: 0 % (ref 0.0–0.2)

## 2024-07-30 LAB — URINALYSIS, ROUTINE W REFLEX MICROSCOPIC
Bilirubin Urine: NEGATIVE
Glucose, UA: NEGATIVE mg/dL
Hgb urine dipstick: NEGATIVE
Ketones, ur: NEGATIVE mg/dL
Leukocytes,Ua: NEGATIVE
Nitrite: NEGATIVE
Protein, ur: NEGATIVE mg/dL
Specific Gravity, Urine: 1.016 (ref 1.005–1.030)
pH: 7 (ref 5.0–8.0)

## 2024-07-30 LAB — COMPREHENSIVE METABOLIC PANEL WITH GFR
ALT: 20 U/L (ref 0–44)
AST: 25 U/L (ref 15–41)
Albumin: 4 g/dL (ref 3.5–5.0)
Alkaline Phosphatase: 52 U/L (ref 38–126)
Anion gap: 12 (ref 5–15)
BUN: 11 mg/dL (ref 6–20)
CO2: 21 mmol/L — ABNORMAL LOW (ref 22–32)
Calcium: 8.9 mg/dL (ref 8.9–10.3)
Chloride: 105 mmol/L (ref 98–111)
Creatinine, Ser: 1.02 mg/dL (ref 0.61–1.24)
GFR, Estimated: >60 mL/min (ref >60)
Glucose, Bld: 116 mg/dL — ABNORMAL HIGH (ref 70–99)
Potassium: 3.8 mmol/L (ref 3.5–5.1)
Sodium: 138 mmol/L (ref 135–145)
Total Bilirubin: 0.6 mg/dL (ref 0.0–1.2)
Total Protein: 7.1 g/dL (ref 6.5–8.1)

## 2024-07-30 LAB — TSH: TSH: 2.75 u[IU]/mL (ref 0.350–4.500)

## 2024-07-30 LAB — LIPASE, BLOOD: Lipase: 37 U/L (ref 11–51)

## 2024-07-30 LAB — TYPE AND SCREEN
ABO/RH(D): A POS
Antibody Screen: NEGATIVE

## 2024-07-30 LAB — TROPONIN I (HIGH SENSITIVITY): Troponin I (High Sensitivity): 6 ng/L (ref <18)

## 2024-07-30 MED ORDER — ONDANSETRON HCL 4 MG/2ML IJ SOLN
4.0000 mg | Freq: Once | INTRAMUSCULAR | Status: AC
  Administered 2024-07-30: 4 mg via INTRAVENOUS
  Filled 2024-07-30: qty 2

## 2024-07-30 MED ORDER — SODIUM CHLORIDE 0.9 % IV BOLUS
1000.0000 mL | Freq: Once | INTRAVENOUS | Status: AC
  Administered 2024-07-30: 1000 mL via INTRAVENOUS

## 2024-07-30 NOTE — ED Provider Notes (Signed)
 [place] EMERGENCY DEPARTMENT AT [facility] Provider Note   CSN: [id number] Arrival date & time: 07/30/24  9249     Patient presents with: Abdominal Pain, Diarrhea, Nausea, and Blood In Stools   Kevin [name] is a 58 y.o. male.   The history is provided by the patient and medical records. No language interpreter was used.  Abdominal Pain Associated symptoms: diarrhea   Diarrhea Associated symptoms: abdominal pain      58 year old male with significant history of diabetes, hyperlipidemia, hypertension, GERD, prior laparoscopic gastric banding presenting with complaint of abdominal pain.  Patient states for the past week he has had increased diffuse abdominal discomfort but pain more noticeable into the epigastric region.  He endorsed persistent nausea without vomiting.  He noticed black tarry stool.  Furthermore for the past several months he has had progressive worsening fatigue and shortness of breath with mental fogginess.  He also endorses approximately an 8 pound weight loss within the past week due to not having much of an appetite and afraid to eat.  He has noticed dark color stool ongoing for nearly a year.  He has had Lap-Band for many years but he recently follow-up with a bariatric surgeon, Dr. Tanda to have planned to have it removed.  He has not had a colonoscopy.  Reports his father died of colon cancer.  He denies NSAID use or alcohol use.  Prior to Admission medications   Medication Sig Start Date End Date Taking? Authorizing Provider  amLODipine  (NORVASC ) 10 MG tablet Take 1 tablet (10 mg total) by mouth daily. 05/22/24   Fleming, Zelda W, NP  atorvastatin  (LIPITOR) 40 MG tablet Take 1 tablet (40 mg total) by mouth daily. 05/22/24   Fleming, Zelda W, NP  Blood Glucose Monitoring Suppl (TRUE METRIX METER) w/Device KIT Use as instructed 10/23/17   Fleming, Zelda W, NP  glimepiride  (AMARYL ) 4 MG tablet Take 1 tablet (4 mg total) by mouth daily with breakfast.  05/22/24   Theotis Haze ORN, NP  glucose blood (TRUE METRIX BLOOD GLUCOSE TEST) test strip Use as instructed. Check blood glucose level by fingerstick twice per day. 11/19/23   Fleming, Zelda W, NP  JANUMET  50-1000 MG tablet Take 1 tablet by mouth 2 (two) times daily with a meal. 05/22/24   Theotis Haze ORN, NP  omeprazole  (PRILOSEC) 20 MG capsule Take 1 capsule (20 mg total) by mouth 2 (two) times daily before a meal. 02/18/24   Theotis Haze ORN, NP  tadalafil  (CIALIS ) 10 MG tablet Take 1 tablet (10 mg total) by mouth every other day as needed for erectile dysfunction. And urinary frequency 07/13/24   Theotis Haze ORN, NP  TRUEplus Lancets 28G MISC Use as instructed. Check blood glucose level by fingerstick twice per day. 11/19/23   Fleming, Zelda W, NP  valsartan  (DIOVAN ) 160 MG tablet Take 1 tablet (160 mg total) by mouth daily. 05/22/24   Theotis Haze ORN, NP    Allergies: Tramadol     Review of Systems  Gastrointestinal:  Positive for abdominal pain and diarrhea.  All other systems reviewed and are negative.   Updated Vital Signs BP (!) 203/112 (BP Location: Right Arm)   Pulse (!) 128   Temp (!) 97.5 F (36.4 C)   Resp (!) 22   SpO2 97%   Physical Exam Vitals and nursing note reviewed.  Constitutional:      General: He is not in acute distress.    Appearance: He is well-developed. He is obese.  Comments: Appears fatigued, having difficulty formulating thoughts  HENT:     Head: Atraumatic.  Eyes:     Conjunctiva/sclera: Conjunctivae normal.  Cardiovascular:     Rate and Rhythm: Tachycardia present.  Pulmonary:     Breath sounds: No wheezing, rhonchi or rales.  Abdominal:     Tenderness: There is abdominal tenderness (Abdomen diffusely tender without guarding or rebound tenderness).  Musculoskeletal:        General: Normal range of motion.     Cervical back: Neck supple.  Skin:    Findings: No rash.  Neurological:     Mental Status: He is alert.     (all labs ordered  are listed, but only abnormal results are displayed) Labs Reviewed  CBC - Abnormal; Notable for the following components:      Result Value   RBC 4.05 (*)    Hemoglobin 9.9 (*)    HCT 32.3 (*)    MCV 79.8 (*)    MCH 24.4 (*)    RDW 18.9 (*)    All other components within normal limits  COMPREHENSIVE METABOLIC PANEL WITH GFR - Abnormal; Notable for the following components:   CO2 21 (*)    Glucose, Bld 116 (*)    All other components within normal limits  LIPASE, BLOOD  COMPREHENSIVE METABOLIC PANEL WITH GFR  CBC  URINALYSIS, ROUTINE W REFLEX MICROSCOPIC  TSH  LIPASE, BLOOD  POC OCCULT BLOOD, ED  TYPE AND SCREEN  TROPONIN I (HIGH SENSITIVITY)  TROPONIN I (HIGH SENSITIVITY)    EKG: None  Date: 07/30/2024  Rate: 95  Rhythm: normal sinus rhythm  QRS Axis: normal  Intervals: normal  ST/T Wave abnormalities: normal  Conduction Disutrbances: none  Narrative Interpretation:   Old EKG Reviewed: No significant changes noted    Radiology: CT ABDOMEN PELVIS WO CONTRAST Result Date: 07/30/2024 CLINICAL DATA:  Upper abdominal pain.  Status post lap band. EXAM: CT ABDOMEN AND PELVIS WITHOUT CONTRAST TECHNIQUE: Multidetector CT imaging of the abdomen and pelvis was performed following the standard protocol without IV contrast. RADIATION DOSE REDUCTION: This exam was performed according to the departmental dose-optimization program which includes automated exposure control, adjustment of the mA and/or kV according to patient size and/or use of iterative reconstruction technique. COMPARISON:  CT abdomen/pelvis dated 07/26/2024. FINDINGS: Lower chest: Redemonstrated 6 mm left lower lobe subpleural nodule (series 4, image 23) and 5 mm right lower lobe subpleural nodule (series 4, image 17). As reported previously, According to the Fleischner Society pulmonary nodule recommendations, the finding is consistent with a solitary solid nodule 6-8 mm and the recommendation is: for low-risk patients,  CT at 6-12 months, then consider CT at 18-24 months; for high-risk patients, CT at 6-12 months, then CT at 18-24 months. Hepatobiliary: No focal liver abnormality is seen. No gallstones, gallbladder wall thickening, or biliary dilatation. Pancreas: Unremarkable. No pancreatic ductal dilatation or surrounding inflammatory changes. Spleen: Normal in size.  Scattered punctate calcified granulomas. Adrenals/Urinary Tract: Adrenal glands are unremarkable. No urolithiasis or hydronephrosis. Bladder is unremarkable. Stomach/Bowel: Stable gastric lap band apparatus. No obstruction or inflammatory changes. Vascular/Lymphatic: Aortic atherosclerosis. Redemonstrated prominent central mesenteric lymph nodes, not definitively enlarged by size criteria. Reproductive: Prostate is unremarkable. Other: No abdominopelvic ascites.  No intraperitoneal free air. Musculoskeletal: No acute osseous abnormality. No suspicious osseous lesion. Redemonstrated neurostimulator generator pack in the posterior right flank with leads extending into the epidural space of the midthoracic spine. Instrumented fusion at L5-S1. Degenerative disc changes at L4-L5. IMPRESSION: 1. No acute  localizing findings in the abdomen or pelvis. 2.  Aortic Atherosclerosis (ICD10-I70.0). Electronically Signed   By: Harrietta Sherry M.D.   On: 07/30/2024 10:47   DG Chest 2 View Result Date: 07/30/2024 CLINICAL DATA:  Shortness of breath EXAM: CHEST - 2 VIEW COMPARISON:  CTA chest dated 07/07/2024 FINDINGS: Mildly low lung volumes. No focal consolidations. No pleural effusion or pneumothorax. The heart size and mediastinal contours are within normal limits. No acute osseous abnormality. Partially imaged intrathecal leads terminate at the level of T6-7. Partially imaged gastric band with phi angle measuring 60 degrees. IMPRESSION: 1. Mildly low lung volumes. No focal consolidations. 2. Slightly increased phi angle of the gastric band, suggestive of malposition.  Electronically Signed   By: Limin  Xu M.D.   On: 07/30/2024 10:20     Procedures   Medications Ordered in the ED  sodium chloride  0.9 % bolus 1,000 mL (0 mLs Intravenous Stopped 07/30/24 1357)  ondansetron  (ZOFRAN ) injection 4 mg (4 mg Intravenous Given 07/30/24 1129)                                    Medical Decision Making Amount and/or Complexity of Data Reviewed Labs: ordered. Radiology: ordered. ECG/medicine tests: ordered.  Risk Prescription drug management.   BP (!) 203/112 (BP Location: Right Arm)   Pulse (!) 128   Temp (!) 97.5 F (36.4 C)   Resp (!) 22   SpO2 97%   71:65 AM  58 year old male with significant history of diabetes, hyperlipidemia, hypertension, GERD, prior laparoscopic gastric banding presenting with complaint of abdominal pain.  Patient states for the past week he has had increased diffuse abdominal discomfort but pain more noticeable into the epigastric region.  He endorsed persistent nausea without vomiting.  He noticed black tarry stool.  Furthermore for the past several months he has had progressive worsening fatigue and shortness of breath with mental fogginess.  He also endorses approximately an 8 pound weight loss within the past week due to not having much of an appetite and afraid to eat.  He has noticed dark color stool ongoing for nearly a year.  He has had Lap-Band for many years but he recently follow-up with a bariatric surgeon, Dr. Tanda to have planned to have it removed.  He has not had a colonoscopy.  Reports his father died of colon cancer.  He denies NSAID use or alcohol use.  Exam notable for tachycardia, diffuse abdominal tenderness, and Hemoccult negative stool.  -Labs ordered, independently viewed and interpreted by me.  Labs remarkable for hemoglobin of 9.9, similar to prior.  Fecal occult blood test is negative, the remainder of his labs are reassuring no metabolic derangement and thyroid  function is normal.  Urinalysis without  signs of UTI, troponin is normal -The patient was maintained on a cardiac monitor.  I personally viewed and interpreted the cardiac monitored which showed an underlying rhythm of: Normal sinus rhythm -Imaging independently viewed and interpreted by me and I agree with radiologist's interpretation.  Result remarkable for chest x-ray unremarkable, repeat abdominal pelvis CT scan without any acute finding -This patient presents to the ED for concern of fatigue, this involves an extensive number of treatment options, and is a complaint that carries with it a high risk of complications and morbidity.  The differential diagnosis includes symptomatic anemia, metabolic derangement, hypovolemia, depression, GI bleed, MI, malignancy -Co morbidities that complicate the patient evaluation includes  diabetes, hyperlipidemia, hypertension, GERD -Treatment includes IV fluid, Zofran  -Reevaluation of the patient after these medicines showed that the patient improved -PCP office notes or outside notes reviewed -Escalation to admission/observation considered: Voiced preference to be admitted for colonoscopy and endoscopy but at this time, I have not identified any acute emergent condition warranting emergent endoscopy or colonoscopy as they no significant drop in his hemoglobin and his fecal occult blood test is negative.  Encourage patient to follow-up outpatient with his GI specialist for further care.  I gave strict return precaution. -Prescription medication considered, patient comfortable with home medication -Social Determinant of Health considered which includes depression, physical inactivity, social isolation      Final diagnoses:  Epigastric pain  Psychogenic general fatigue    ED Discharge Orders     None          Nivia Colon, PA-C 07/30/24 1400    Tegeler, Lonni PARAS, MD 07/30/24 1538

## 2024-07-30 NOTE — Discharge Instructions (Signed)
 You have been evaluated for your abdominal pain and shortness of breath.  Your current hemoglobin is 9.9.  No evidence of rectal bleeding on exam.  Your chest x-ray along with your abdomen and pelvis CT scan did not show any new changes or any concerning finding.  It is important for you to have a colonoscopy and endoscopy for further assessment.  However if you develop worsening condition you may return for recheck of your labs.  Otherwise please reach out to your primary care provider for further care.

## 2024-07-30 NOTE — ED Triage Notes (Addendum)
 Pt. Stated, Jesus had stomach pain for the last week really bad. Ive had diarrhea, a lot of nausea. I do have a lap band. I have blood in my stool all the time.

## 2024-07-30 NOTE — ED Notes (Signed)
 Pt is leaving for lobby in no new onset distress to await wife for pick up. Paper work reviewed with pt. Pt states he's ready to go home.

## 2024-07-30 NOTE — ED Notes (Signed)
 Asked pt for a urine sample in traige, pt declined having to go at this time. Pt said he would try to go later.

## 2024-08-04 ENCOUNTER — Other Ambulatory Visit (INDEPENDENT_AMBULATORY_CARE_PROVIDER_SITE_OTHER)

## 2024-08-04 ENCOUNTER — Encounter: Payer: Self-pay | Admitting: Gastroenterology

## 2024-08-04 ENCOUNTER — Other Ambulatory Visit: Payer: Self-pay

## 2024-08-04 ENCOUNTER — Ambulatory Visit: Admitting: Gastroenterology

## 2024-08-04 VITALS — BP 152/84 | HR 98 | Ht 67.0 in | Wt 189.0 lb

## 2024-08-04 DIAGNOSIS — R1084 Generalized abdominal pain: Secondary | ICD-10-CM | POA: Diagnosis not present

## 2024-08-04 DIAGNOSIS — K5909 Other constipation: Secondary | ICD-10-CM

## 2024-08-04 DIAGNOSIS — D508 Other iron deficiency anemias: Secondary | ICD-10-CM

## 2024-08-04 LAB — IBC + FERRITIN
Ferritin: 9.6 ng/mL — ABNORMAL LOW (ref 22.0–322.0)
Iron: 62 ug/dL (ref 42–165)
Saturation Ratios: 13 % — ABNORMAL LOW (ref 20.0–50.0)
TIBC: 476 ug/dL — ABNORMAL HIGH (ref 250.0–450.0)
Transferrin: 340 mg/dL (ref 212.0–360.0)

## 2024-08-04 MED ORDER — NA SULFATE-K SULFATE-MG SULF 17.5-3.13-1.6 GM/177ML PO SOLN
1.0000 | Freq: Once | ORAL | 0 refills | Status: AC
  Filled 2024-08-04: qty 354, 1d supply, fill #0

## 2024-08-04 NOTE — Patient Instructions (Signed)
 Your provider has requested that you go to the basement level for lab work before leaving today. Press B on the elevator. The lab is located at the first door on the left as you exit the elevator.  Due to recent changes in healthcare laws, you may see the results of your imaging and laboratory studies on MyChart before your provider has had a chance to review them.  We understand that in some cases there may be results that are confusing or concerning to you. Not all laboratory results come back in the same time frame and the provider may be waiting for multiple results in order to interpret others.  Please give us  48 hours in order for your provider to thoroughly review all the results before contacting the office for clarification of your results.    You have been scheduled for an endoscopy and colonoscopy. Please follow the written instructions given to you at your visit today.  If you use inhalers (even only as needed), please bring them with you on the day of your procedure.  DO NOT TAKE 7 DAYS PRIOR TO TEST- Trulicity (dulaglutide) Ozempic, Wegovy (semaglutide) Mounjaro, Zepbound (tirzepatide) Bydureon Bcise (exanatide extended release)  DO NOT TAKE 1 DAY PRIOR TO YOUR TEST Rybelsus (semaglutide) Adlyxin (lixisenatide) Victoza (liraglutide) Byetta (exanatide) ___________________________________________________________________________   Thank you for entrusting me with your care and for choosing [facility], Dr. Victory Brand III  _______________________________________________________  If your blood pressure at your visit was 140/90 or greater, please contact your primary care physician to follow up on this.  _______________________________________________________  If you are age 58 or older, your body mass index should be between 23-30. Your Body mass index is 29.6 kg/m. If this is out of the aforementioned range listed, please consider follow up with your Primary Care  Provider.  If you are age 58 or younger, your body mass index should be between 19-25. Your Body mass index is 29.6 kg/m. If this is out of the aformentioned range listed, please consider follow up with your Primary Care Provider.   ________________________________________________________  The [facility] providers would like to encourage you to use MYCHART to communicate with providers for non-urgent requests or questions.  Due to long hold times on the telephone, sending your provider a message by [facility] may be a faster and more efficient way to get a response.  Please allow 48 business hours for a response.  Please remember that this is for non-urgent requests.  _______________________________________________________  [facility] is using a team-based approach to care.  Your team is made up of your doctor and two to three APPS. Our APPS (Nurse Practitioners and Physician Assistants) work with your physician to ensure care continuity for you. They are fully qualified to address your health concerns and develop a treatment plan. They communicate directly with your gastroenterologist to care for you. Seeing the Advanced Practice Practitioners on your physician's team can help you by facilitating care more promptly, often allowing for earlier appointments, access to diagnostic testing, procedures, and other specialty referrals.

## 2024-08-04 NOTE — Progress Notes (Signed)
 [facility] Consult Note:  History: Kevin [name] 08/04/2024  Referring provider: Theotis [name] ORN, NP  Reason for consult/chief complaint: GI Bleeding, Abdominal Pain, and Colonoscopy (Family history of colon cancer, father)   Subjective  Prior history:  History of lap band placed decades ago Prior GI evaluations outside this health system, no records currently available. November 2025 saw bariatric surgery (Dr. Tanda, note on file), outlined difficulties patient has had related to the Lap-Band as well as imaging findings plans to remove it. While no reported proximal gastric/esophageal obstructive findings on the CT abdomen report, Dr. Luigi office note indicates that he reviewed the images and felt that there was dilatation of the proximal stomach as well as the esophagus. History of IDA as well.  ED visit 07/30/2024 (records reviewed), for escalating abdominal pain, loss of appetite, dark stool (sent a photo to primary care in a portal message, which he also showed me today-it is a large amount of formed stool that does not clearly have blood on her and it) FOBT negative at ED visit hemoglobin stable from prior value Patient is requesting admission for an upper endoscopy and colonoscopy, though ED provider did not see cause for admission.  Discussed the use of AI scribe software for clinical note transcription with the patient, who gave verbal consent to proceed.  History of Present Illness Kevin [name] is a 58 year old male with a history of lap band surgery who presents with abdominal pain as well as anemia and concerns about possible colon cancer.  (It was challenging to obtain a clear and consistent history given this patient's significant anxiety and tangential/distracted thought pattern today)  Abdominal pain - Intermittent since at least sometime earlier this year, now constant, severe abdominal pain for the past two weeks - Pain is diffuse, described  as 'all over' the abdomen - Pain worsens after eating or drinking  Altered bowel habits - Change in bowel habits over the past year - Bowel movements occur every four days - Stools are sometimes small and skinny - Presence of a substance in stool since May or June, initially thought to be undigested food, now questioning if it is blood - History of irregular bowel habits since his Lap-Band decades ago  Symptoms of anemia - Shortness of breath - Difficulty talking and breathing simultaneously - Cognitive fog - Weakness and difficulty walking around the house - Self-initiated daily supplementation with B12, iron, folic acid, and vitamin C for the past two months - No recent iron level testing - Symptoms persist despite supplementation  History of gastrointestinal procedures and findings - Lap band surgery in 2006 - Colonoscopy in 2010 showed pre-polyp lesions (records not available today) - Did not follow up as recommended after colonoscopy   Kevin [name] has become increasingly anxious about the possibility that he may have colorectal cancer.   ROS:  Review of Systems  Constitutional:  Negative for appetite change and unexpected weight change.  HENT:  Negative for mouth sores and voice change.   Eyes:  Negative for pain and redness.  Respiratory:  Negative for cough and shortness of breath.   Cardiovascular:  Negative for chest pain and palpitations.  Genitourinary:  Negative for dysuria and hematuria.  Musculoskeletal:  Negative for arthralgias and myalgias.  Skin:  Negative for pallor and rash.  Neurological:  Negative for weakness and headaches.  Hematological:  Negative for adenopathy.  Psychiatric/Behavioral:         Anxiety, sleep disturbance     Past  Medical History: Past Medical History:  Diagnosis Date   COVID 04/20/2020   Diabetes mellitus without complication (HCC)    GERD (gastroesophageal reflux disease)    Hypertension    MRSA (methicillin resistant  Staphylococcus aureus)   From 07/23/2024 surgical office consult note (Dr. Tanda): He had a lap band placed in 2006 in [place] , initially weighing 234 pounds. Three months post-surgery, he developed gastritis after crushing and ingesting Advil , which has been a persistent issue. He uses omeprazole  daily to manage the gastritis.  In 2012, he experienced a lap band slip while in [place], which was managed by removing all the fluid from the band. The slip resolved over the next month or two. A year later, fluid was reintroduced to the band in [facility], and it was adjusted to about 40-50% closed. He has not seen a doctor for the lap band since then.  He reports symptoms of iron deficiency anemia, including shortness of breath, which worsened significantly in May-June 2025. He was hospitalized for shortness of breath a few weeks ago, where a heart workup was performed. He started taking iron supplements, B12, vitamin C, folic acid, and a men's multivitamin five weeks ago, which improved his symptoms, including vision issues.  He experiences frequent regurgitation, especially if he eats too quickly or takes one bite too many. He can regurgitate food at will and often feels heartburn or reflux, which is controlled with omeprazole . He also reports a nighttime cough if he eats late.  He had a colonoscopy in 2010 due to anemia, which showed no significant findings. He does not currently have a local GI doctor. He takes medication for blood pressure and diabetes. No unintentional weight loss, significant changes in stool consistency or color, or pain with swallowing.   Patient was referred to see us  for endoscopic evaluation of his iron deficiency anemia.  He is scheduled for 12//25 to remove the Lap-Band   Past Surgical History: Past Surgical History:  Procedure Laterality Date   BACK SURGERY     LAPAROSCOPIC GASTRIC BANDING     TRIGGER FINGER RELEASE Right 10/05/2020   Procedure: RELEASE  TRIGGER FINGER RIGHT LONG FINGER;  Surgeon: Kevin Kay HERO, MD;  Location: [facility];  Service: Orthopedics;  Laterality: Right;     Family History: Family History  Problem Relation Age of Onset   Heart disease Mother    Hypertension Mother    Diabetes Sister    Hypertension Sister    Stroke Brother    Diabetes Brother    Heart disease Brother    Heart attack Brother    Hypertension Brother    Hypertension Father     Social History: Social History   Socioeconomic History   Marital status: Divorced    Spouse name: Not on file   Number of children: Not on file   Years of education: Not on file   Highest education level: Not on file  Occupational History   Not on file  Tobacco Use   Smoking status: Former    Current packs/day: 0.00    Types: Cigarettes    Start date: 1985    Quit date: 2000    Years since quitting: 25.9   Smokeless tobacco: Never  Vaping Use   Vaping status: Never Used  Substance and Sexual Activity   Alcohol use: No   Drug use: No   Sexual activity: Yes  Other Topics Concern   Not on file  Social History Narrative   Not  on file   Social Drivers of Health   Financial Resource Strain: Low Risk  (07/22/2023)   Overall Financial Resource Strain (CARDIA)    Difficulty of Paying Living Expenses: Not hard at all  Food Insecurity: No Food Insecurity (07/22/2023)   Hunger Vital Sign    Worried About Running Out of Food in the Last Year: Never true    Ran Out of Food in the Last Year: Never true  Transportation Needs: No Transportation Needs (07/22/2023)   PRAPARE - Administrator, Civil Service (Medical): No    Lack of Transportation (Non-Medical): No  Physical Activity: Inactive (07/22/2023)   Exercise Vital Sign    Days of Exercise per Week: 0 days    Minutes of Exercise per Session: 0 min  Stress: No Stress Concern Present (07/22/2023)   [employer] of Occupational Health - Occupational Stress Questionnaire     Feeling of Stress : Not at all  Social Connections: Socially Isolated (07/22/2023)   Social Connection and Isolation Panel    Frequency of Communication with Friends and Family: Twice a week    Frequency of Social Gatherings with Friends and Family: More than three times a week    Attends Religious Services: Never    Database Administrator or Organizations: No    Attends Banker Meetings: Never    Marital Status: Divorced    Allergies: Allergies  Allergen Reactions   Tramadol  Other (See Comments)    Seizure    Nsaids Other (See Comments)    Lap band    Outpatient Meds: Current Outpatient Medications  Medication Sig Dispense Refill   amLODipine  (NORVASC ) 10 MG tablet Take 1 tablet (10 mg total) by mouth daily. 90 tablet 2   atorvastatin  (LIPITOR) 40 MG tablet Take 1 tablet (40 mg total) by mouth daily. 90 tablet 2   glimepiride  (AMARYL ) 4 MG tablet Take 1 tablet (4 mg total) by mouth daily with breakfast. 90 tablet 2   glucose blood (TRUE METRIX BLOOD GLUCOSE TEST) test strip Use as instructed. Check blood glucose level by fingerstick twice per day. 200 each 12   JANUMET  50-1000 MG tablet Take 1 tablet by mouth 2 (two) times daily with a meal. 180 tablet 1   Na Sulfate-K Sulfate-Mg Sulfate concentrate (SUPREP) 17.5-3.13-1.6 GM/177ML SOLN Take 1 kit (354 mLs total) by mouth once for 1 dose. 354 mL 0   omeprazole  (PRILOSEC) 20 MG capsule Take 1 capsule (20 mg total) by mouth 2 (two) times daily before a meal. (Patient taking differently: Take 20 mg by mouth daily.) 180 capsule 1   tadalafil  (CIALIS ) 10 MG tablet Take 1 tablet (10 mg total) by mouth every other day as needed for erectile dysfunction. And urinary frequency 15 tablet 2   TRUEplus Lancets 28G MISC Use as instructed. Check blood glucose level by fingerstick twice per day. 200 each 6   valsartan  (DIOVAN ) 160 MG tablet Take 1 tablet (160 mg total) by mouth daily. 90 tablet 2   No current facility-administered  medications for this visit.      ___________________________________________________________________ Objective   Exam:  BP (!) 152/84   Pulse 98   Ht 5' 7 (1.702 m)   Wt 189 lb (85.7 kg)   BMI 29.60 kg/m  Wt Readings from Last 3 Encounters:  08/04/24 189 lb (85.7 kg)  07/28/24 191 lb 12.8 oz (87 kg)  07/26/24 194 lb (88 kg)  No one accompanying him in clinic today.  He  says his partner was waiting outside  He was markedly hypertensive and was tachycardic on initial vital signs noted above. Those were rechecked after several minutes of him sitting alone in the exam room deep breathing.  (and those vital signs were reportedly documented, though did not make it into this note) with a blood pressure down in the 150s over 80s and a pulse in the 90s  General: Anxious, fair eye contact, pressured speech Eyes: sclera anicteric, no redness ENT: oral mucosa moist without lesions, no cervical or supraclavicular lymphadenopathy CV: Regular without appreciable murmur, no JVD, mild bilateral pretibial edema Resp: clear to auscultation bilaterally, normal RR and effort noted GI: soft, mild scattered tenderness, with active bowel sounds of normal character. No guarding or palpable organomegaly noted.  No distention Skin; warm and dry, no rash or jaundice noted Neuro: awake, alert and oriented x 3. Normal gross motor function and fluent speech   Labs:     Latest Ref Rng & Units 07/30/2024   11:36 AM 07/30/2024    8:08 AM 07/26/2024    4:36 PM  CBC  WBC 4.0 - 10.5 K/uL 6.0  PATIENT IDENTIFICATION ERROR. PLEASE DISREGARD RESULTS. ACCOUNT WILL BE CREDITED.  C 7.0   Hemoglobin 13.0 - 17.0 g/dL 9.9  PATIENT IDENTIFICATION ERROR. PLEASE DISREGARD RESULTS. ACCOUNT WILL BE CREDITED.  C 10.1   Hematocrit 39.0 - 52.0 % 32.3  PATIENT IDENTIFICATION ERROR. PLEASE DISREGARD RESULTS. ACCOUNT WILL BE CREDITED.  C 32.1   Platelets 150 - 400 K/uL 270  PATIENT IDENTIFICATION ERROR. PLEASE DISREGARD  RESULTS. ACCOUNT WILL BE CREDITED.  C 297     C Corrected result   MCV 79  Last iron studies on file have a ferritin of 14 in September 2022  Radiologic Studies:  CLINICAL DATA:  Upper abdominal pain.  Status post lap band.   EXAM: CT ABDOMEN AND PELVIS WITHOUT CONTRAST   TECHNIQUE: Multidetector CT imaging of the abdomen and pelvis was performed following the standard protocol without IV contrast.   RADIATION DOSE REDUCTION: This exam was performed according to the departmental dose-optimization program which includes automated exposure control, adjustment of the mA and/or kV according to patient size and/or use of iterative reconstruction technique.   COMPARISON:  CT abdomen/pelvis dated 07/26/2024.   FINDINGS: Lower chest: Redemonstrated 6 mm left lower lobe subpleural nodule (series 4, image 23) and 5 mm right lower lobe subpleural nodule (series 4, image 17). As reported previously, According to the Fleischner Society pulmonary nodule recommendations, the finding is consistent with a solitary solid nodule 6-8 mm and the recommendation is: for low-risk patients, CT at 6-12 months, then consider CT at 18-24 months; for high-risk patients, CT at 6-12 months, then CT at 18-24 months.   Hepatobiliary: No focal liver abnormality is seen. No gallstones, gallbladder wall thickening, or biliary dilatation.   Pancreas: Unremarkable. No pancreatic ductal dilatation or surrounding inflammatory changes.   Spleen: Normal in size.  Scattered punctate calcified granulomas.   Adrenals/Urinary Tract: Adrenal glands are unremarkable. No urolithiasis or hydronephrosis. Bladder is unremarkable.   Stomach/Bowel: Stable gastric lap band apparatus. No obstruction or inflammatory changes.   Vascular/Lymphatic: Aortic atherosclerosis. Redemonstrated prominent central mesenteric lymph nodes, not definitively enlarged by size criteria.   Reproductive: Prostate is unremarkable.    Other: No abdominopelvic ascites.  No intraperitoneal free air.   Musculoskeletal: No acute osseous abnormality. No suspicious osseous lesion. Redemonstrated neurostimulator generator pack in the posterior right flank with leads extending into the epidural space of  the midthoracic spine. Instrumented fusion at L5-S1. Degenerative disc changes at L4-L5.   IMPRESSION: 1. No acute localizing findings in the abdomen or pelvis. 2.  Aortic Atherosclerosis (ICD10-I70.0).     Electronically Signed   By: Harrietta Sherry M.D.   On: 07/30/2024 10:47  __________________________________   Narrative & Impression  EXAM: CT ABDOMEN AND PELVIS WITH CONTRAST 07/26/2024 07:03:01 PM   TECHNIQUE: CT of the abdomen and pelvis was performed with the administration of 100 mL iohexol  (OMNIPAQUE ) 300 MG/ML solution. Multiplanar reformatted images are provided for review. Automated exposure control, iterative reconstruction, and/or weight-based adjustment of the mA/kV was utilized to reduce the radiation dose to as low as reasonably achievable.   COMPARISON: 06/30/2019   CLINICAL HISTORY: abd pain \\T \ bloody stools, s/p lap band   FINDINGS:   LOWER CHEST: Scattered coronary calcifications. 6 mm pleural based nodule, lateral left lower lobe, previously 4 mm. 5 mm subpleural nodule, posterior right lower lobe (5:26) more conspicuous than on prior exam. According to the Fleischner Society pulmonary nodule recommendations, the finding is consistent with a solitary solid nodule 6-8 mm and the recommendation is: for low-risk patients, CT at 6-12 months, then consider CT at 18-24 months; for high-risk patients, CT at 6-12 months, then CT at 18-24 months.   LIVER: The liver is unremarkable.   GALLBLADDER AND BILE DUCTS: Gallbladder is unremarkable. No biliary ductal dilatation.   SPLEEN: Scattered punctate calcified granulomas in the spleen, normal in size.   PANCREAS: No acute  abnormality.   ADRENAL GLANDS: No acute abnormality.   KIDNEYS, URETERS AND BLADDER: No stones in the kidneys or ureters. No hydronephrosis. No perinephric or periureteral stranding. Urinary bladder is unremarkable. Mild prostate enlargement.   GI AND BOWEL: Stable gastric lap band apparatus. Stomach demonstrates no acute abnormality. There is no bowel obstruction.   PERITONEUM AND RETROPERITONEUM: No ascites. No free air.   VASCULATURE: Aorta is normal in caliber. Moderate scattered calcified aortoiliac plaque without aneurysm.   LYMPH NODES: Prominent central mesenteric lymph nodes, none greater than 1 cm short axis diameter.   REPRODUCTIVE ORGANS: No acute abnormality.   BONES AND SOFT TISSUES: Implantable stimulator device in the posterior right flank with catheters extending into the epidural space of the mid thoracic spine as before. Stable instrumented fusion L5-S1. Mild degenerative disc disease L4-L5 with vacuum phenomenon progressive since prior study. No acute osseous abnormality.   IMPRESSION: 1. Stable gastric lap band apparatus. 2. No acute findings in the abdomen or pelvis. 3. Progressive adjacent level degenerative disc disease L4 L5.   Electronically signed by: Dayne Hassell MD 07/26/2024 07:18 PM EST RP Workstation: [id number]   CT images personally reviewed.  While there is a visible gastric pouch above the Lap-Band, I do not appreciate any dilatation of the esophagus. SHAN Brand MD  Encounter Diagnoses  Name Primary?   Generalized abdominal pain Yes   Other iron  deficiency anemia    Chronic constipation    Assessment & Plan Persistent anemia with suspected iron  deficiency.  He put himself on some supplements including B12 and iron  months ago, no iron  levels have been checked in years. I tried my best to explain to him anemia can occur for multiple reasons and that if this is iron  deficiency, it may indicate chronic GI blood loss and/or poor iron   absorption.  I would not expect the latter to happen from a gastric band, but the band could also potentially develop complications with erosion and ulcer.  - Ordered iron   level tests. - Consider intravenous iron if oral iron insufficient or poorly tolerated. - Adjust oral iron dosage based on results.  Abdominal pain with altered bowel habits (constipation and diarrhea) Chronic pain with alternating bowel habits. Previous CT scans negative for masses. Differential includes gastrointestinal bleeding or other pathology. Not clear why his abdominal pain has increased recently, though there is no obstruction or other urgent finding on 2 recent CT scans of the abdomen.  I suspect his anxiety is driving this up as well. - Scheduled colonoscopy to evaluate for bleeding or pathology. - Scheduled upper endoscopy to assess for upper GI bleeding or other causes.  History of adjustable gastric band (lap band) placement Lap band placed in 2006, associated with gastritis. Declined removal despite recommendations. He was originally scheduled for surgical removal and says he has now decided not to do that because he wants his endoscopic procedures evaluated first.  Anxiety Significant anxiety related to health concerns and procedures. - Provided reassurance regarding health status and procedural plans. - Discussed potential for expedited procedure scheduling.  I told Calil that he needs both procedures done, and I explained these in detail along with risks and benefits and diagrams of the anatomy.  Rationale for both procedures explained and he was agreeable.  The benefits and risks of the planned procedure(s) were described in detail with the patient or (when appropriate) their health care proxy.  Risks were outlined as including, but not limited to, bleeding, infection, perforation, adverse medication reaction leading to cardiac or pulmonary decompensation, pancreatitis (if ERCP).  The limitation of  incomplete mucosal visualization was also discussed.  No guarantees or warranties were given.   My next available date for double procedure was on 09/02/2024, but he said he simply could not wait that long for answers.  I then offered him the option of doing procedures on separate days, which is what he wanted to do.  He wanted the colonoscopy first date that is about 8 days from now, the upper scope is the following week.  Will let him know when we get the iron levels back from today and adjust his iron accordingly.  Will forward this report to primary care as well as his surgeon.   Thank you for the courtesy of this consult.  Please call me with any questions or concerns.   60 minutes were spent on this encounter, including in depth chart review, independent review of results as outlined above, communicating results with the patient directly, face-to-face time with the patient, coordinating care, ordering studies and medications as appropriate, and documentation.    Victory LITTIE Brand III  CC: Referring provider noted above

## 2024-08-05 ENCOUNTER — Ambulatory Visit: Payer: Self-pay | Admitting: Gastroenterology

## 2024-08-05 ENCOUNTER — Encounter: Admitting: Gastroenterology

## 2024-08-10 ENCOUNTER — Ambulatory Visit: Admitting: Gastroenterology

## 2024-08-11 ENCOUNTER — Other Ambulatory Visit: Payer: Self-pay

## 2024-08-11 ENCOUNTER — Emergency Department (HOSPITAL_COMMUNITY)

## 2024-08-11 ENCOUNTER — Telehealth: Payer: Self-pay | Admitting: Gastroenterology

## 2024-08-11 ENCOUNTER — Encounter (HOSPITAL_COMMUNITY): Payer: Self-pay | Admitting: Emergency Medicine

## 2024-08-11 ENCOUNTER — Inpatient Hospital Stay (HOSPITAL_COMMUNITY)
Admission: EM | Admit: 2024-08-11 | Discharge: 2024-08-14 | DRG: 378 | Disposition: A | Discharge status: Home / Self Care | Attending: Internal Medicine | Admitting: Internal Medicine

## 2024-08-11 DIAGNOSIS — Z87891 Personal history of nicotine dependence: Secondary | ICD-10-CM

## 2024-08-11 DIAGNOSIS — K9509 Other complications of gastric band procedure: Secondary | ICD-10-CM | POA: Diagnosis present

## 2024-08-11 DIAGNOSIS — K2289 Other specified disease of esophagus: Secondary | ICD-10-CM | POA: Diagnosis present

## 2024-08-11 DIAGNOSIS — D120 Benign neoplasm of cecum: Secondary | ICD-10-CM | POA: Diagnosis present

## 2024-08-11 DIAGNOSIS — K3189 Other diseases of stomach and duodenum: Secondary | ICD-10-CM | POA: Diagnosis present

## 2024-08-11 DIAGNOSIS — E119 Type 2 diabetes mellitus without complications: Secondary | ICD-10-CM | POA: Diagnosis present

## 2024-08-11 DIAGNOSIS — G8929 Other chronic pain: Secondary | ICD-10-CM

## 2024-08-11 DIAGNOSIS — Z800 Family history of malignant neoplasm of digestive organs: Secondary | ICD-10-CM

## 2024-08-11 DIAGNOSIS — Z79899 Other long term (current) drug therapy: Secondary | ICD-10-CM

## 2024-08-11 DIAGNOSIS — I2510 Atherosclerotic heart disease of native coronary artery without angina pectoris: Secondary | ICD-10-CM | POA: Diagnosis present

## 2024-08-11 DIAGNOSIS — W19XXXA Unspecified fall, initial encounter: Secondary | ICD-10-CM | POA: Diagnosis present

## 2024-08-11 DIAGNOSIS — K921 Melena: Principal | ICD-10-CM

## 2024-08-11 DIAGNOSIS — E440 Moderate protein-calorie malnutrition: Secondary | ICD-10-CM | POA: Diagnosis present

## 2024-08-11 DIAGNOSIS — D62 Acute posthemorrhagic anemia: Secondary | ICD-10-CM | POA: Diagnosis not present

## 2024-08-11 DIAGNOSIS — K5520 Angiodysplasia of colon without hemorrhage: Secondary | ICD-10-CM

## 2024-08-11 DIAGNOSIS — K31811 Angiodysplasia of stomach and duodenum with bleeding: Principal | ICD-10-CM | POA: Diagnosis present

## 2024-08-11 DIAGNOSIS — R55 Syncope and collapse: Secondary | ICD-10-CM

## 2024-08-11 DIAGNOSIS — D509 Iron deficiency anemia, unspecified: Secondary | ICD-10-CM

## 2024-08-11 DIAGNOSIS — Z6829 Body mass index (BMI) 29.0-29.9, adult: Secondary | ICD-10-CM

## 2024-08-11 DIAGNOSIS — Z823 Family history of stroke: Secondary | ICD-10-CM

## 2024-08-11 DIAGNOSIS — R195 Other fecal abnormalities: Secondary | ICD-10-CM

## 2024-08-11 DIAGNOSIS — Z833 Family history of diabetes mellitus: Secondary | ICD-10-CM

## 2024-08-11 DIAGNOSIS — D125 Benign neoplasm of sigmoid colon: Secondary | ICD-10-CM | POA: Diagnosis present

## 2024-08-11 DIAGNOSIS — D127 Benign neoplasm of rectosigmoid junction: Secondary | ICD-10-CM | POA: Diagnosis present

## 2024-08-11 DIAGNOSIS — K922 Gastrointestinal hemorrhage, unspecified: Secondary | ICD-10-CM | POA: Diagnosis not present

## 2024-08-11 DIAGNOSIS — Y831 Surgical operation with implant of artificial internal device as the cause of abnormal reaction of the patient, or of later complication, without mention of misadventure at the time of the procedure: Secondary | ICD-10-CM | POA: Diagnosis present

## 2024-08-11 DIAGNOSIS — K449 Diaphragmatic hernia without obstruction or gangrene: Secondary | ICD-10-CM | POA: Diagnosis present

## 2024-08-11 DIAGNOSIS — R194 Change in bowel habit: Secondary | ICD-10-CM

## 2024-08-11 DIAGNOSIS — Z886 Allergy status to analgesic agent status: Secondary | ICD-10-CM

## 2024-08-11 DIAGNOSIS — Z8249 Family history of ischemic heart disease and other diseases of the circulatory system: Secondary | ICD-10-CM

## 2024-08-11 DIAGNOSIS — K5521 Angiodysplasia of colon with hemorrhage: Secondary | ICD-10-CM | POA: Diagnosis present

## 2024-08-11 DIAGNOSIS — Z7984 Long term (current) use of oral hypoglycemic drugs: Secondary | ICD-10-CM

## 2024-08-11 DIAGNOSIS — Z8616 Personal history of COVID-19: Secondary | ICD-10-CM

## 2024-08-11 DIAGNOSIS — K641 Second degree hemorrhoids: Secondary | ICD-10-CM | POA: Diagnosis present

## 2024-08-11 DIAGNOSIS — Z860100 Personal history of colon polyps, unspecified: Secondary | ICD-10-CM

## 2024-08-11 DIAGNOSIS — R911 Solitary pulmonary nodule: Secondary | ICD-10-CM | POA: Diagnosis present

## 2024-08-11 DIAGNOSIS — K2970 Gastritis, unspecified, without bleeding: Secondary | ICD-10-CM | POA: Diagnosis present

## 2024-08-11 DIAGNOSIS — D123 Benign neoplasm of transverse colon: Secondary | ICD-10-CM | POA: Diagnosis present

## 2024-08-11 DIAGNOSIS — S0121XA Laceration without foreign body of nose, initial encounter: Secondary | ICD-10-CM | POA: Diagnosis present

## 2024-08-11 DIAGNOSIS — I10 Essential (primary) hypertension: Secondary | ICD-10-CM | POA: Diagnosis present

## 2024-08-11 LAB — CBC WITH DIFFERENTIAL/PLATELET
Abs Immature Granulocytes: 0.02 K/uL (ref 0.00–0.07)
Basophils Absolute: 0.1 K/uL (ref 0.0–0.1)
Basophils Relative: 1 %
Eosinophils Absolute: 0.1 K/uL (ref 0.0–0.5)
Eosinophils Relative: 2 %
HCT: 38.7 % — ABNORMAL LOW (ref 39.0–52.0)
Hemoglobin: 12.5 g/dL — ABNORMAL LOW (ref 13.0–17.0)
Immature Granulocytes: 0 %
Lymphocytes Relative: 29 %
Lymphs Abs: 2 K/uL (ref 0.7–4.0)
MCH: 25.4 pg — ABNORMAL LOW (ref 26.0–34.0)
MCHC: 32.3 g/dL (ref 30.0–36.0)
MCV: 78.5 fL — ABNORMAL LOW (ref 80.0–100.0)
Monocytes Absolute: 0.7 K/uL (ref 0.1–1.0)
Monocytes Relative: 10 %
Neutro Abs: 3.9 K/uL (ref 1.7–7.7)
Neutrophils Relative %: 58 %
Platelets: 342 K/uL (ref 150–400)
RBC: 4.93 MIL/uL (ref 4.22–5.81)
RDW: 18.9 % — ABNORMAL HIGH (ref 11.5–15.5)
WBC: 6.7 K/uL (ref 4.0–10.5)
nRBC: 0 % (ref 0.0–0.2)

## 2024-08-11 LAB — COMPREHENSIVE METABOLIC PANEL WITH GFR
ALT: 28 U/L (ref 0–44)
AST: 28 U/L (ref 15–41)
Albumin: 4.9 g/dL (ref 3.5–5.0)
Alkaline Phosphatase: 92 U/L (ref 38–126)
Anion gap: 16 — ABNORMAL HIGH (ref 5–15)
BUN: 10 mg/dL (ref 6–20)
CO2: 21 mmol/L — ABNORMAL LOW (ref 22–32)
Calcium: 9.7 mg/dL (ref 8.9–10.3)
Chloride: 98 mmol/L (ref 98–111)
Creatinine, Ser: 1.11 mg/dL (ref 0.61–1.24)
GFR, Estimated: >60 mL/min (ref >60)
Glucose, Bld: 117 mg/dL — ABNORMAL HIGH (ref 70–99)
Potassium: 4.1 mmol/L (ref 3.5–5.1)
Sodium: 135 mmol/L (ref 135–145)
Total Bilirubin: 0.5 mg/dL (ref 0.0–1.2)
Total Protein: 8.1 g/dL (ref 6.5–8.1)

## 2024-08-11 LAB — URINALYSIS, ROUTINE W REFLEX MICROSCOPIC
Bilirubin Urine: NEGATIVE
Glucose, UA: NEGATIVE mg/dL
Hgb urine dipstick: NEGATIVE
Ketones, ur: 5 mg/dL — AB
Leukocytes,Ua: NEGATIVE
Nitrite: NEGATIVE
Protein, ur: NEGATIVE mg/dL
Specific Gravity, Urine: 1.019 (ref 1.005–1.030)
pH: 8 (ref 5.0–8.0)

## 2024-08-11 LAB — TYPE AND SCREEN
ABO/RH(D): A POS
Antibody Screen: NEGATIVE

## 2024-08-11 LAB — AMMONIA: Ammonia: 19 umol/L (ref 9–35)

## 2024-08-11 LAB — TROPONIN T, HIGH SENSITIVITY
Troponin T High Sensitivity: <15 ng/L (ref 0–19)
Troponin T High Sensitivity: <15 ng/L (ref 0–19)

## 2024-08-11 LAB — GLUCOSE, CAPILLARY: Glucose-Capillary: 84 mg/dL (ref 70–99)

## 2024-08-11 LAB — CBG MONITORING, ED: Glucose-Capillary: 110 mg/dL — ABNORMAL HIGH (ref 70–99)

## 2024-08-11 MED ORDER — INSULIN ASPART 100 UNIT/ML IJ SOLN
0.0000 [IU] | Freq: Every day | INTRAMUSCULAR | Status: DC
  Administered 2024-08-13: 2 [IU] via SUBCUTANEOUS

## 2024-08-11 MED ORDER — AMLODIPINE BESYLATE 10 MG PO TABS
10.0000 mg | ORAL_TABLET | Freq: Every day | ORAL | Status: DC
  Administered 2024-08-12 – 2024-08-14 (×2): 10 mg via ORAL
  Filled 2024-08-11 (×2): qty 1

## 2024-08-11 MED ORDER — ONDANSETRON HCL 4 MG/2ML IJ SOLN
4.0000 mg | Freq: Once | INTRAMUSCULAR | Status: AC
  Administered 2024-08-11: 4 mg via INTRAVENOUS
  Filled 2024-08-11: qty 2

## 2024-08-11 MED ORDER — ACETAMINOPHEN 650 MG RE SUPP: 650.0000 mg | Freq: Four times a day (QID) | RECTAL | Status: DC | PRN

## 2024-08-11 MED ORDER — PANTOPRAZOLE SODIUM 40 MG IV SOLR
80.0000 mg | Freq: Once | INTRAVENOUS | Status: AC
  Administered 2024-08-11: 80 mg via INTRAVENOUS
  Filled 2024-08-11: qty 20

## 2024-08-11 MED ORDER — FENTANYL CITRATE (PF) 50 MCG/ML IJ SOSY
50.0000 ug | PREFILLED_SYRINGE | Freq: Once | INTRAMUSCULAR | Status: AC
  Administered 2024-08-11: 50 ug via INTRAVENOUS
  Filled 2024-08-11: qty 1

## 2024-08-11 MED ORDER — INSULIN ASPART 100 UNIT/ML IJ SOLN
0.0000 [IU] | Freq: Three times a day (TID) | INTRAMUSCULAR | Status: DC
  Administered 2024-08-12: 5 [IU] via SUBCUTANEOUS
  Administered 2024-08-12: 2 [IU] via SUBCUTANEOUS
  Filled 2024-08-11: qty 5
  Filled 2024-08-11: qty 2
  Filled 2024-08-11: qty 5

## 2024-08-11 MED ORDER — ACETAMINOPHEN 325 MG PO TABS: 650.0000 mg | ORAL_TABLET | Freq: Four times a day (QID) | ORAL | Status: DC | PRN

## 2024-08-11 MED ORDER — HYDROMORPHONE HCL 1 MG/ML IJ SOLN
0.5000 mg | INTRAMUSCULAR | Status: AC | PRN
  Administered 2024-08-11 – 2024-08-12 (×2): 0.5 mg via INTRAVENOUS
  Filled 2024-08-11 (×2): qty 0.5

## 2024-08-11 MED ORDER — IOHEXOL 350 MG/ML SOLN
100.0000 mL | Freq: Once | INTRAVENOUS | Status: AC | PRN
  Administered 2024-08-11: 100 mL via INTRAVENOUS

## 2024-08-11 MED ORDER — LACTATED RINGERS IV BOLUS
1000.0000 mL | Freq: Once | INTRAVENOUS | Status: AC
  Administered 2024-08-11: 1000 mL via INTRAVENOUS

## 2024-08-11 MED ORDER — PANTOPRAZOLE SODIUM 40 MG PO TBEC
40.0000 mg | DELAYED_RELEASE_TABLET | Freq: Every day | ORAL | Status: DC
  Administered 2024-08-12 – 2024-08-14 (×2): 40 mg via ORAL
  Filled 2024-08-11 (×2): qty 1

## 2024-08-11 MED ORDER — ONDANSETRON HCL 4 MG/2ML IJ SOLN
4.0000 mg | Freq: Four times a day (QID) | INTRAMUSCULAR | Status: DC | PRN
  Administered 2024-08-12: 4 mg via INTRAVENOUS
  Filled 2024-08-11: qty 2

## 2024-08-11 MED ORDER — ATORVASTATIN CALCIUM 40 MG PO TABS
40.0000 mg | ORAL_TABLET | Freq: Every day | ORAL | Status: DC
  Administered 2024-08-12 – 2024-08-14 (×2): 40 mg via ORAL
  Filled 2024-08-11 (×2): qty 1

## 2024-08-11 MED ORDER — ALBUTEROL SULFATE (2.5 MG/3ML) 0.083% IN NEBU: 2.5000 mg | INHALATION_SOLUTION | RESPIRATORY_TRACT | Status: DC | PRN

## 2024-08-11 MED ORDER — ONDANSETRON HCL 4 MG PO TABS: 4.0000 mg | ORAL_TABLET | Freq: Four times a day (QID) | ORAL | Status: DC | PRN

## 2024-08-11 MED ORDER — SODIUM CHLORIDE (PF) 0.9 % IJ SOLN
INTRAMUSCULAR | Status: AC
  Filled 2024-08-11: qty 50

## 2024-08-11 NOTE — Telephone Encounter (Signed)
 Inbound call from patient spouse states they are heading to [facility] due to patient having low energy, cold sweats and tingling. Requesting f/u call at your earliest. Please advise.   Thank you

## 2024-08-11 NOTE — H&P (Signed)
 History and Physical  Thane Age FMW:[id number] DOB: 02-Feb-1966 DOA: 08/11/2024  PCP: Theotis Haze ORN, NP   Chief Complaint: Syncope, dark stools  HPI: Kevin [name] is a 58 y.o. male with medical history significant for 1 year of melena, chronic abdominal pain, anxiety, non-insulin-dependent type 2 diabetes, hypertension being admitted to the hospital with progressive weight loss, weakness and syncope in the setting of melena concerning for GI bleed.  Patient states that about a decade ago, he was told that he had some abnormal polyps on colonoscopy and was told to follow-up with gastroenterology within the next 2 to 3 years for repeat colonoscopy and he never followed up.  For the last year or so, he has been having melanotic stools, he has lost about 20 pounds of weight.  He has intermittent crampy lower abdominal pain which is somewhat chronic, over the last month or 2, his melanotic stools have become more frequent, and they are now mixed with some blood.  He denies any nausea or vomiting, or any blood thinners.  No fevers chills or weight loss, however over the last couple of days he has become progressively more weak, feeling cold and shivering even under a hot shower.  He notes that over the last 2 to 3 months, his stools have become very low caliber almost like spaghetti.  He and his wife are concerned that he may have a tumor.  He has been followed outpatient by [facility] gastroenterology, is scheduled for colonoscopy on 12/4.  Today he had a couple of witnessed syncopal events, no chest pain, he did have prodrome of vague nausea and lightheadedness.  No injury.  Review of Systems: Please see HPI for pertinent positives and negatives. A complete 10 system review of systems are otherwise negative.  Past Medical History:  Diagnosis Date   COVID 04/20/2020   Diabetes mellitus without complication (HCC)    GERD (gastroesophageal reflux disease)    Hypertension    MRSA (methicillin resistant  Staphylococcus aureus)    Past Surgical History:  Procedure Laterality Date   BACK SURGERY     LAPAROSCOPIC GASTRIC BANDING     TRIGGER FINGER RELEASE Right 10/05/2020   Procedure: RELEASE TRIGGER FINGER RIGHT LONG FINGER;  Surgeon: Jerri Kay HERO, MD;  Location: [facility];  Service: Orthopedics;  Laterality: Right;   Social History:  reports that he quit smoking about 25 years ago. His smoking use included cigarettes. He started smoking about 40 years ago. He has never used smokeless tobacco. He reports that he does not drink alcohol and does not use drugs.  Allergies  Allergen Reactions   Nsaids Other (See Comments)    Lap band   Tramadol  Other (See Comments)    Seizures    Family History  Problem Relation Age of Onset   Heart disease Mother    Hypertension Mother    Diabetes Sister    Hypertension Sister    Stroke Brother    Diabetes Brother    Heart disease Brother    Heart attack Brother    Hypertension Brother    Hypertension Father      Prior to Admission medications   Medication Sig Start Date End Date Taking? Authorizing Provider  amLODipine  (NORVASC ) 10 MG tablet Take 1 tablet (10 mg total) by mouth daily. 05/22/24  Yes Fleming, Zelda W, NP  ascorbic acid (VITAMIN C) 500 MG tablet Take 500-1,000 mg by mouth daily.   Yes [provider]  atorvastatin  (LIPITOR) 40 MG tablet  Take 1 tablet (40 mg total) by mouth daily. 05/22/24  Yes Fleming, Zelda W, NP  Cyanocobalamin (VITAMIN B12 PO) Take 1 tablet by mouth daily.   Yes [provider]  folic acid (FOLVITE) 800 MCG tablet Take 800 mcg by mouth daily.   Yes [provider]  glimepiride  (AMARYL ) 4 MG tablet Take 1 tablet (4 mg total) by mouth daily with breakfast. 05/22/24  Yes Fleming, Zelda W, NP  JANUMET  50-1000 MG tablet Take 1 tablet by mouth 2 (two) times daily with a meal. Patient taking differently: Take 1 tablet by mouth See admin instructions. Take 20 mg by mouth with  meals in the morning and evening 05/22/24  Yes Fleming, Zelda W, NP  omeprazole  (PRILOSEC) 20 MG capsule Take 1 capsule (20 mg total) by mouth 2 (two) times daily before a meal. Patient taking differently: Take 20 mg by mouth See admin instructions. Take 20 mg by mouth 30 minutes before meals in the morning and evening 02/18/24  Yes Fleming, Zelda W, NP  valsartan  (DIOVAN ) 160 MG tablet Take 1 tablet (160 mg total) by mouth daily. 05/22/24  Yes Theotis Haze ORN, NP  ferrous sulfate  325 (65 FE) MG tablet Take 325 mg by mouth daily with breakfast. Patient not taking: Reported on 08/11/2024    [provider]  glucose blood (TRUE METRIX BLOOD GLUCOSE TEST) test strip Use as instructed. Check blood glucose level by fingerstick twice per day. 11/19/23   Fleming, Zelda W, NP  tadalafil  (CIALIS ) 10 MG tablet Take 1 tablet (10 mg total) by mouth every other day as needed for erectile dysfunction. And urinary frequency Patient not taking: Reported on 08/11/2024 07/13/24   Fleming, Zelda W, NP  TRUEplus Lancets 28G MISC Use as instructed. Check blood glucose level by fingerstick twice per day. 11/19/23   Theotis Haze ORN, NP    Physical Exam: BP (!) 140/94   Pulse 92   Resp 13   SpO2 99%  General:  Alert, oriented, calm, in no acute distress  Eyes: EOMI, clear conjuctivae, white sclerea Neck: supple, no masses, trachea mildline  Cardiovascular: RRR, no murmurs or rubs, no peripheral edema  Respiratory: clear to auscultation bilaterally, no wheezes, no crackles  Abdomen: soft, tender with some voluntary guarding, distended.  Normal active bowel sounds heard. Skin: dry, no rashes  Musculoskeletal: no joint effusions, normal range of motion  Psychiatric: appropriate affect, normal speech  Neurologic: extraocular muscles intact, clear speech, moving all extremities with intact sensorium         Labs on Admission:  Basic Metabolic Panel: Recent Labs  Lab 08/11/24 1313  NA 135  K 4.1  CL 98   CO2 21*  GLUCOSE 117*  BUN 10  CREATININE 1.11  CALCIUM  9.7   Liver Function Tests: Recent Labs  Lab 08/11/24 1313  AST 28  ALT 28  ALKPHOS 92  BILITOT 0.5  PROT 8.1  ALBUMIN 4.9   No results for input(s): LIPASE, AMYLASE in the last 168 hours. Recent Labs  Lab 08/11/24 1514  AMMONIA 19   CBC: Recent Labs  Lab 08/11/24 1313  WBC 6.7  NEUTROABS 3.9  HGB 12.5*  HCT 38.7*  MCV 78.5*  PLT 342   Cardiac Enzymes: No results for input(s): CKTOTAL, CKMB, CKMBINDEX, TROPONINI in the last 168 hours. BNP (last 3 results) No results for input(s): BNP in the last 8760 hours.  ProBNP (last 3 results) Recent Labs    07/05/24 0956  PROBNP 85.5  CBG: Recent Labs  Lab 08/11/24 1403  GLUCAP 110*    Radiological Exams on Admission: CT Head Wo Contrast Result Date: 08/11/2024 EXAM: CT HEAD WITHOUT CONTRAST 08/11/2024 02:55:24 PM TECHNIQUE: CT of the head was performed without the administration of intravenous contrast. Automated exposure control, iterative reconstruction, and/or weight based adjustment of the mA/kV was utilized to reduce the radiation dose to as low as reasonably achievable. COMPARISON: 05/03/2017 CLINICAL HISTORY: Confusion, unwitnessed fall earlier. FINDINGS: BRAIN AND VENTRICLES: No acute hemorrhage. No evidence of acute infarct. No hydrocephalus. No extra-axial collection. No mass effect or midline shift. ORBITS: No acute abnormality. SINUSES: No acute abnormality. SOFT TISSUES AND SKULL: No acute soft tissue abnormality. No skull fracture. IMPRESSION: 1. No acute intracranial abnormality. Electronically signed by: Franky Crease MD 08/11/2024 03:39 PM EST RP Workstation: [id number]   CT Angio Chest/Abd/Pel for Dissection W and/or W/WO Result Date: 08/11/2024 EXAM: CTA CHEST, ABDOMEN AND PELVIS WITHOUT AND WITH CONTRAST 08/11/2024 02:55:24 PM TECHNIQUE: CTA of the chest was performed without and with the administration of intravenous contrast.  CTA of the abdomen and pelvis was performed without and with the administration of intravenous contrast. 100 mL iohexol  (OMNIPAQUE ) 350 MG/ML injection 100 mL IOHEXOL  350 MG/ML SOLN was administered. Multiplanar reformatted images are provided for review. MIP images are provided for review. Automated exposure control, iterative reconstruction, and/or weight based adjustment of the mA/kV was utilized to reduce the radiation dose to as low as reasonably achievable. COMPARISON: CT abdomen and pelvis 07/30/2024. CT angio chest 07/05/2024. CLINICAL HISTORY: GI bleed. FINDINGS: VASCULATURE: AORTA: Scattered aortic atherosclerosis. No aneurysm or dissection. PULMONARY ARTERIES: No pulmonary embolism with the limits of this exam. GREAT VESSELS OF AORTIC ARCH: No acute finding. No dissection. No arterial occlusion or significant stenosis. CELIAC TRUNK: No acute finding. No occlusion or significant stenosis. SUPERIOR MESENTERIC ARTERY: No acute finding. No occlusion or significant stenosis. INFERIOR MESENTERIC ARTERY: No acute finding. No occlusion or significant stenosis. RENAL ARTERIES: No acute finding. No occlusion or significant stenosis. ILIAC ARTERIES: No acute finding. No occlusion or significant stenosis. CHEST: MEDIASTINUM: Extensive 3 vessel coronary artery disease. No mediastinal lymphadenopathy. The heart and pericardium demonstrate no acute abnormality. LUNGS AND PLEURA: Small scattered pulmonary nodules, index nodule in the posterior left lower lobe measures 6 mm on image 93 compared to 5 mm previously, not significantly changed. Index posterior right lower lobe nodule on image 87 measures 4 mm, stable. No new or significantly enlarging nodule. No focal consolidation or pulmonary edema. No evidence of pleural effusion or pneumothorax. THORACIC BONES AND SOFT TISSUES: No acute bone or soft tissue abnormality. ABDOMEN AND PELVIS: LIVER: The liver is unremarkable. GALLBLADDER AND BILE DUCTS: Gallbladder is  unremarkable. No biliary ductal dilatation. SPLEEN: The spleen is unremarkable. PANCREAS: The pancreas is unremarkable. ADRENAL GLANDS: Bilateral adrenal glands demonstrate no acute abnormality. KIDNEYS, URETERS AND BLADDER: No stones in the kidneys or ureters. No hydronephrosis. No perinephric or periureteral stranding. Urinary bladder is unremarkable. GI AND BOWEL: Lap band noted in place around the proximal stomach. This is unchanged. Stomach and duodenal sweep demonstrate no acute abnormality. There is no bowel obstruction. No abnormal bowel wall thickening or distension. REPRODUCTIVE: Reproductive organs are unremarkable. PERITONEUM AND RETROPERITONEUM: No ascites or free air. LYMPH NODES: No lymphadenopathy. ABDOMINAL BONES AND SOFT TISSUES: No acute abnormality of the bones. No acute soft tissue abnormality. IMPRESSION: 1. No evidence of aortic aneurysm or dissection. 2. No evidence of pulmonary embolism. 3. Extensive coronary artery disease. 4. Small scattered pulmonary nodules,  with the largest in the posterior left lower lobe measuring 6 mm and without significant interval change; management per Fleischner Society Guidelines is recommended with follow-up chest CT in 1 year. Electronically signed by: Franky Crease MD 08/11/2024 03:38 PM EST RP Workstation: [id number]   Assessment/Plan  Travante Herbers is a 57 y.o. male with medical history significant for 1 year of melena, chronic abdominal pain, anxiety, non-insulin-dependent type 2 diabetes, hypertension being admitted to the hospital with progressive weight loss, weakness and syncope in the setting of melena concerning for GI bleed.  GI bleed-with melena, mixed hematochezia, in the setting of significant weight loss and low caliber stools.  Concerning for possible colon malignancy. -Observation admission -Monitor closely on progressive unit with telemetry -Liquid diet today, n.p.o. after midnight -Avoid blood thinners -Check stat hemoglobin and trend  in case of evidence of active GI bleeding -ER provider discussed with [facility], who will see in consultation in the morning  Non-insulin-dependent type 2 diabetes-well-controlled, last A1c 6.3. -Carb modified diet when eating -Moderate dose sliding scale  Hypertension-hold home ARB, resume amlodipine  in the morning if blood pressure remains stable  DVT prophylaxis: SCDs only    Code Status: Full Code  Consults called: [facility]  Admission status: Observation  Time spent: 59 minutes  Cleva Camero CHRISTELLA Gail MD [facility] Pager [phone]  If 7PM-7AM, please contact night-coverage [email or web address] Password [facility]  08/11/2024, 6:32 PM

## 2024-08-11 NOTE — Telephone Encounter (Signed)
 Spoke with pt's spouse. She reports they are taking the pt to [facility] ER due to head to toe tingling episodes of passing out, and the pt not being able to get warm. She voiced concern about possibly missing the scheduled colonoscopy on 12/4. I gave recommendations to be evaluated at the ER, with further workup pending ER physician recommendations. Spouse wanted to make sure Dr. Legrand was aware of the pt's situation.

## 2024-08-11 NOTE — ED Triage Notes (Addendum)
 Pt arriving POV with increased confusion and syncope. Pts friend at bedside reports pt has had black tarry stools for a while and is scheduled for colonoscopy on Thursday. Pt has hx of anemia stopped taking his iron supplement in preparation for his colonoscopy. Pt began having confusion and feeling as if was going to pass out. Pt had syncopal episode upon arrival.

## 2024-08-11 NOTE — ED Provider Notes (Signed)
 Kevin [name]   CSN: [id number] Arrival date & time: 08/11/24  1251     Patient presents with: GI Bleeding and Altered Mental Status   Kevin [name] is a 58 y.o. male.   Patient is here for evaluation of confusion and syncopal episode.  Patient had a syncopal episode at home about 1 hour ago.  He alerted his partner who brought him here for further evaluation.  He is also confused, stating that he does not recall how he arrived to the hospital.  He is aware that he is in the hospital and is able to name his partner who is accompanying him.  Patient has a known GI bleed with a scheduled colonoscopy for this Thursday for evaluation of assumed colon cancer.  He has been having black stool for at least the last 3 weeks.  He has become progressively more weak and confused over the last 3 weeks.  His partner says he would have trouble finding his words previously however, today the confusion has been much worse.  Last bowel movement was yesterday and was not a large amount.  He is not taking a blood thinner.  Patient has been having waves of abdominal pain.  Abdominal pain is improved with pressure applied to the suprapubic region.  He has had progressively worse shortness of breath and dizziness.  Denies recent fevers.  Patient has been complaining of nausea though has had no episodes of emesis.  Partner states the patient has been drinking fluids though still feels dry.  He has not reported any increase in bleeding or other output increase.  No known head injury though syncopal episode earlier today was unwitnessed.  Patient is Mediterranean and naturally tanned/olive skin; partner does [name] he appears more yellow today especially over his forehead.  On reevaluation, after pain medication and fluid bolus given, patient is much more alert and talkative.  He does still report dizziness with ambulation as he has been ambulating to the bathroom with  assistance.  He also still complains of shortness of breath with extended talking and activity.  He has had no syncopal episodes in the ED today.  The history is provided by the patient and the spouse.  Altered Mental Status Presenting symptoms: confusion and memory loss        Prior to Admission medications   Medication Sig Start Date End Date Taking? Authorizing Provider  amLODipine  (NORVASC ) 10 MG tablet Take 1 tablet (10 mg total) by mouth daily. 05/22/24  Yes Fleming, Zelda W, NP  ascorbic acid (VITAMIN C) 500 MG tablet Take 500-1,000 mg by mouth daily.   Yes [provider]  atorvastatin  (LIPITOR) 40 MG tablet Take 1 tablet (40 mg total) by mouth daily. 05/22/24  Yes Fleming, Zelda W, NP  Cyanocobalamin (VITAMIN B12 PO) Take 1 tablet by mouth daily.   Yes [provider]  folic acid (FOLVITE) 800 MCG tablet Take 800 mcg by mouth daily.   Yes [provider]  glimepiride  (AMARYL ) 4 MG tablet Take 1 tablet (4 mg total) by mouth daily with breakfast. 05/22/24  Yes Fleming, Zelda W, NP  JANUMET  50-1000 MG tablet Take 1 tablet by mouth 2 (two) times daily with a meal. Patient taking differently: Take 1 tablet by mouth See admin instructions. Take 20 mg by mouth with meals in the morning and evening 05/22/24  Yes Fleming, Zelda W, NP  omeprazole  (PRILOSEC) 20 MG capsule Take 1 capsule (20 mg total)  by mouth 2 (two) times daily before a meal. Patient taking differently: Take 20 mg by mouth See admin instructions. Take 20 mg by mouth 30 minutes before meals in the morning and evening 02/18/24  Yes Fleming, Zelda W, NP  valsartan  (DIOVAN ) 160 MG tablet Take 1 tablet (160 mg total) by mouth daily. 05/22/24  Yes Theotis Haze ORN, NP  ferrous sulfate  325 (65 FE) MG tablet Take 325 mg by mouth daily with breakfast. Patient not taking: Reported on 08/11/2024    [provider]  glucose blood (TRUE METRIX BLOOD GLUCOSE TEST) test strip Use as instructed. Check blood  glucose level by fingerstick twice per day. 11/19/23   Fleming, Zelda W, NP  tadalafil  (CIALIS ) 10 MG tablet Take 1 tablet (10 mg total) by mouth every other day as needed for erectile dysfunction. And urinary frequency Patient not taking: Reported on 08/11/2024 07/13/24   Fleming, Zelda W, NP  TRUEplus Lancets 28G MISC Use as instructed. Check blood glucose level by fingerstick twice per day. 11/19/23   Theotis Haze ORN, NP    Allergies: Nsaids and Tramadol     Review of Systems  Psychiatric/Behavioral:  Positive for confusion and memory loss.    Vitals:   08/11/24 1502 08/11/24 1700 08/11/24 1835 08/11/24 2009  BP:  (!) 140/94  (!) 171/87  Pulse:  92  88  Resp: 12 13  18   Temp:   98.2 F (36.8 C) 98.6 F (37 C)  TempSrc:   Oral Oral  SpO2:  99%  100%  Weight:    84 kg  Height:    5' 7 (1.702 m)     Updated Vital Signs BP (!) 171/87 (BP Location: Right Arm)   Pulse 88   Temp 98.6 F (37 C) (Oral)   Resp 18   Ht 5' 7 (1.702 m)   Wt 84 kg   SpO2 100%   BMI 29.01 kg/m   Physical Exam Vitals and nursing [name] reviewed.  Constitutional:      General: He is not in acute distress.    Appearance: Normal appearance. He is ill-appearing. He is not diaphoretic.     Comments: Patient is easily arousable and follows commands.  HENT:     Head: Normocephalic and atraumatic.     Nose: Nose normal.     Mouth/Throat:     Mouth: Mucous membranes are dry.     Pharynx: Oropharynx is clear.  Eyes:     General: No scleral icterus.    Extraocular Movements: Extraocular movements intact.     Pupils: Pupils are equal, round, and reactive to light.  Cardiovascular:     Rate and Rhythm: Tachycardia present. Rhythm irregular.  Pulmonary:     Effort: Pulmonary effort is normal. No respiratory distress.     Breath sounds: Normal breath sounds. No stridor. No wheezing, rhonchi or rales.  Abdominal:     General: Abdomen is flat. Bowel sounds are normal. There is distension.     Palpations:  Abdomen is soft. There is mass (Periumbilical left side, nonpulsatile -has Lap-Band port).     Tenderness: There is abdominal tenderness. There is guarding.  Musculoskeletal:        General: No swelling, tenderness, deformity or signs of injury.     Right lower leg: No edema.     Left lower leg: No edema.  Skin:    General: Skin is warm and dry.     Capillary Refill: Capillary refill takes less than 2 seconds.  Coloration: Skin is jaundiced. Skin is not pale.     Findings: No bruising.  Neurological:     Mental Status: He is alert and oriented to person, place, and time.     Motor: Weakness present.     Comments: He is able to answer orientation questions appropriately though he goes on to say he does not recall how he got to the hospital today.  He repeated this statement several times during our exam.     (all labs ordered are listed, but only abnormal results are displayed) Labs Reviewed  COMPREHENSIVE METABOLIC PANEL WITH GFR - Abnormal; Notable for the following components:      Result Value   CO2 21 (*)    Glucose, Bld 117 (*)    Anion gap 16 (*)    All other components within normal limits  CBC WITH DIFFERENTIAL/PLATELET - Abnormal; Notable for the following components:   Hemoglobin 12.5 (*)    HCT 38.7 (*)    MCV 78.5 (*)    MCH 25.4 (*)    RDW 18.9 (*)    All other components within normal limits  URINALYSIS, ROUTINE W REFLEX MICROSCOPIC - Abnormal; Notable for the following components:   Color, Urine COLORLESS (*)    Ketones, ur 5 (*)    All other components within normal limits  CBG MONITORING, ED - Abnormal; Notable for the following components:   Glucose-Capillary 110 (*)    All other components within normal limits  AMMONIA  GLUCOSE, CAPILLARY  HIV ANTIBODY (ROUTINE TESTING W REFLEX)  BASIC METABOLIC PANEL WITH GFR  CBC  I-STAT CHEM 8, ED  TYPE AND SCREEN  TROPONIN T, HIGH SENSITIVITY  TROPONIN T, HIGH SENSITIVITY    EKG: EKG  Interpretation Date/Time:  Tuesday August 11 2024 13:09:55 EST Ventricular Rate:  109 PR Interval:  129 QRS Duration:  87 QT Interval:  329 QTC Calculation: 443 R Axis:   21  Text Interpretation: Sinus tachycardia Multiform ventricular premature complexes Low voltage, precordial leads Compared with prior EKG from 11/202/2025 Confirmed by Gennaro Bouchard ([id number]) on 08/11/2024 2:41:30 PM  Radiology: CT Head Wo Contrast Result Date: 08/11/2024 EXAM: CT HEAD WITHOUT CONTRAST 08/11/2024 02:55:24 PM TECHNIQUE: CT of the head was performed without the administration of intravenous contrast. Automated exposure control, iterative reconstruction, and/or weight based adjustment of the mA/kV was utilized to reduce the radiation dose to as low as reasonably achievable. COMPARISON: 05/03/2017 CLINICAL HISTORY: Confusion, unwitnessed fall earlier. FINDINGS: BRAIN AND VENTRICLES: No acute hemorrhage. No evidence of acute infarct. No hydrocephalus. No extra-axial collection. No mass effect or midline shift. ORBITS: No acute abnormality. SINUSES: No acute abnormality. SOFT TISSUES AND SKULL: No acute soft tissue abnormality. No skull fracture. IMPRESSION: 1. No acute intracranial abnormality. Electronically signed by: Franky Crease MD 08/11/2024 03:39 PM EST RP Workstation: [id number]   CT Angio Chest/Abd/Pel for Dissection W and/or W/WO Result Date: 08/11/2024 EXAM: CTA CHEST, ABDOMEN AND PELVIS WITHOUT AND WITH CONTRAST 08/11/2024 02:55:24 PM TECHNIQUE: CTA of the chest was performed without and with the administration of intravenous contrast. CTA of the abdomen and pelvis was performed without and with the administration of intravenous contrast. 100 mL iohexol  (OMNIPAQUE ) 350 MG/ML injection 100 mL IOHEXOL  350 MG/ML SOLN was administered. Multiplanar reformatted images are provided for review. MIP images are provided for review. Automated exposure control, iterative reconstruction, and/or weight based adjustment of  the mA/kV was utilized to reduce the radiation dose to as low as reasonably achievable. COMPARISON: CT abdomen and  pelvis 07/30/2024. CT angio chest 07/05/2024. CLINICAL HISTORY: GI bleed. FINDINGS: VASCULATURE: AORTA: Scattered aortic atherosclerosis. No aneurysm or dissection. PULMONARY ARTERIES: No pulmonary embolism with the limits of this exam. GREAT VESSELS OF AORTIC ARCH: No acute finding. No dissection. No arterial occlusion or significant stenosis. CELIAC TRUNK: No acute finding. No occlusion or significant stenosis. SUPERIOR MESENTERIC ARTERY: No acute finding. No occlusion or significant stenosis. INFERIOR MESENTERIC ARTERY: No acute finding. No occlusion or significant stenosis. RENAL ARTERIES: No acute finding. No occlusion or significant stenosis. ILIAC ARTERIES: No acute finding. No occlusion or significant stenosis. CHEST: MEDIASTINUM: Extensive 3 vessel coronary artery disease. No mediastinal lymphadenopathy. The heart and pericardium demonstrate no acute abnormality. LUNGS AND PLEURA: Small scattered pulmonary nodules, index nodule in the posterior left lower lobe measures 6 mm on image 93 compared to 5 mm previously, not significantly changed. Index posterior right lower lobe nodule on image 87 measures 4 mm, stable. No new or significantly enlarging nodule. No focal consolidation or pulmonary edema. No evidence of pleural effusion or pneumothorax. THORACIC BONES AND SOFT TISSUES: No acute bone or soft tissue abnormality. ABDOMEN AND PELVIS: LIVER: The liver is unremarkable. GALLBLADDER AND BILE DUCTS: Gallbladder is unremarkable. No biliary ductal dilatation. SPLEEN: The spleen is unremarkable. PANCREAS: The pancreas is unremarkable. ADRENAL GLANDS: Bilateral adrenal glands demonstrate no acute abnormality. KIDNEYS, URETERS AND BLADDER: No stones in the kidneys or ureters. No hydronephrosis. No perinephric or periureteral stranding. Urinary bladder is unremarkable. GI AND BOWEL: Lap band noted in  place around the proximal stomach. This is unchanged. Stomach and duodenal sweep demonstrate no acute abnormality. There is no bowel obstruction. No abnormal bowel wall thickening or distension. REPRODUCTIVE: Reproductive organs are unremarkable. PERITONEUM AND RETROPERITONEUM: No ascites or free air. LYMPH NODES: No lymphadenopathy. ABDOMINAL BONES AND SOFT TISSUES: No acute abnormality of the bones. No acute soft tissue abnormality. IMPRESSION: 1. No evidence of aortic aneurysm or dissection. 2. No evidence of pulmonary embolism. 3. Extensive coronary artery disease. 4. Small scattered pulmonary nodules, with the largest in the posterior left lower lobe measuring 6 mm and without significant interval change; management per Fleischner Society Guidelines is recommended with follow-up chest CT in 1 year. Electronically signed by: Franky Crease MD 08/11/2024 03:38 PM EST RP Workstation: [id number]     Procedures   Medications Ordered in the ED  insulin  aspart (novoLOG ) injection 0-15 Units (has no administration in time range)  insulin  aspart (novoLOG ) injection 0-5 Units ( Subcutaneous Not Given 08/11/24 2205)  amLODipine  (NORVASC ) tablet 10 mg (has no administration in time range)  atorvastatin  (LIPITOR) tablet 40 mg (has no administration in time range)  pantoprazole  (PROTONIX ) EC tablet 40 mg (has no administration in time range)  acetaminophen  (TYLENOL ) tablet 650 mg (has no administration in time range)    Or  acetaminophen  (TYLENOL ) suppository 650 mg (has no administration in time range)  ondansetron  (ZOFRAN ) tablet 4 mg (has no administration in time range)    Or  ondansetron  (ZOFRAN ) injection 4 mg (has no administration in time range)  albuterol  (PROVENTIL ) (2.5 MG/3ML) 0.083% nebulizer solution 2.5 mg (has no administration in time range)  HYDROmorphone  (DILAUDID ) injection 0.5 mg (0.5 mg Intravenous Given 08/11/24 2209)  lactated ringers  bolus 1,000 mL (0 mLs Intravenous Stopped 08/11/24  1623)  ondansetron  (ZOFRAN ) injection 4 mg (4 mg Intravenous Given 08/11/24 1514)  fentaNYL  (SUBLIMAZE ) injection 50 mcg (50 mcg Intravenous Given 08/11/24 1515)  iohexol  (OMNIPAQUE ) 350 MG/ML injection 100 mL (100 mLs Intravenous Contrast Given 08/11/24 1426)  fentaNYL  (SUBLIMAZE ) injection 50 mcg (50 mcg Intravenous Given 08/11/24 1623)  pantoprazole  (PROTONIX ) injection 80 mg (80 mg Intravenous Given 08/11/24 1806)  lactated ringers  bolus 1,000 mL (1,000 mLs Intravenous New Bag/Given 08/11/24 1805)    Patient presents to the ED for concern of syncopal episode in the setting of known GI bleed, this involves an extensive number of treatment options, and is a complaint that carries with it a high risk of complications and morbidity.  The differential diagnosis includes GI bleed, sepsis, hepatic injury, biliary disease, MI, stroke, hypoglycemia, anemia, dehydration.  Last hemoglobin on 12/2 was 12.5.  Prior hemoglobin to that checked on 11/20 was 9.9.  Co morbidities that complicate the patient evaluation  Known GI bleed Diabetes History of anemia   Additional history obtained:  Additional history obtained from  Outside Medical Records   External records from outside source obtained and reviewed including recent GI workup and previous lab values for comparison.   Lab Tests:  I Ordered, and personally interpreted labs.  The pertinent results include: Hemoglobin, LFTs, ammonia, and troponin are reassuring.  No leukocytosis.   Imaging Studies ordered:  I ordered imaging studies including CT head without contrast and CT angio chest abdomen pelvis. Imaging which showed no acute findings on CT head or CT angio chest abdomen pelvis.   Cardiac Monitoring:  The patient was maintained on a cardiac monitor.  I personally viewed and interpreted the cardiac monitored which showed an underlying rhythm of: Sinus tachycardia with occasional PVCs and no obvious evidence of STEMI.   Medicines  ordered and prescription drug management:  I ordered medication including LR bolus for fluid resuscitation.  Fentanyl  for pain management.  Zofran  and Protonix  for nausea. Reevaluation of the patient after these medicines showed that the patient improved.  Tachycardia and general disposition improved with fluid resuscitation.   Consultations Obtained:  I requested consultation with [facility] Soundra Bring MD) and hospitalist (Mir Pomeroy MD),  and discussed lab and imaging findings as well as pertinent plan - they recommend: GI recommend admission for patient and they will wait him tomorrow for ongoing management.  Spoke with hospitalist who accepted admission to higher level of care.   Problem List / ED Course:  Syncope in the setting of known GI bleed.  Blood work and imaging unremarkable for source of syncopal episode or continued dizziness and shortness of breath with exertion today.  Patient improvement though not resolution of symptoms with fluid resuscitation, pain management, and antiemetic. Consulted with [facility] (patient already established with this practice) who recommended admission with plan to evaluate at hospital tomorrow. Hospitalist accepted patient for admission.   Reevaluation:  After the interventions noted above, I reevaluated the patient and found that they have :improved   Dispostion:  Hospital admission with planned evaluation by GI tomorrow for ongoing care and management.   Medical Decision Making Amount and/or Complexity of Data Reviewed Labs: ordered. Radiology: ordered.  Risk Prescription drug management. Decision regarding hospitalization.    Final diagnoses:  Melena    ED Discharge Orders     None          Reg Bircher A, PA-C 08/11/24 2228    Gennaro Bouchard L, DO 08/12/24 [phone]

## 2024-08-12 ENCOUNTER — Telehealth: Payer: Self-pay

## 2024-08-12 ENCOUNTER — Observation Stay (HOSPITAL_COMMUNITY)

## 2024-08-12 ENCOUNTER — Telehealth (HOSPITAL_COMMUNITY): Payer: Self-pay | Admitting: Internal Medicine

## 2024-08-12 DIAGNOSIS — I428 Other cardiomyopathies: Secondary | ICD-10-CM

## 2024-08-12 DIAGNOSIS — R55 Syncope and collapse: Secondary | ICD-10-CM | POA: Diagnosis not present

## 2024-08-12 DIAGNOSIS — G8929 Other chronic pain: Secondary | ICD-10-CM

## 2024-08-12 DIAGNOSIS — E440 Moderate protein-calorie malnutrition: Secondary | ICD-10-CM | POA: Insufficient documentation

## 2024-08-12 DIAGNOSIS — D509 Iron deficiency anemia, unspecified: Secondary | ICD-10-CM | POA: Insufficient documentation

## 2024-08-12 DIAGNOSIS — K922 Gastrointestinal hemorrhage, unspecified: Secondary | ICD-10-CM | POA: Diagnosis not present

## 2024-08-12 DIAGNOSIS — R194 Change in bowel habit: Secondary | ICD-10-CM

## 2024-08-12 DIAGNOSIS — R195 Other fecal abnormalities: Secondary | ICD-10-CM

## 2024-08-12 LAB — GLUCOSE, CAPILLARY
Glucose-Capillary: 126 mg/dL — ABNORMAL HIGH (ref 70–99)
Glucose-Capillary: 74 mg/dL (ref 70–99)
Glucose-Capillary: 214 mg/dL — ABNORMAL HIGH (ref 70–99)
Glucose-Capillary: 119 mg/dL — ABNORMAL HIGH (ref 70–99)
Glucose-Capillary: 108 mg/dL — ABNORMAL HIGH (ref 70–99)

## 2024-08-12 LAB — BASIC METABOLIC PANEL WITH GFR
Anion gap: 11 (ref 5–15)
BUN: 8 mg/dL (ref 6–20)
CO2: 24 mmol/L (ref 22–32)
Calcium: 9.3 mg/dL (ref 8.9–10.3)
Chloride: 105 mmol/L (ref 98–111)
Creatinine, Ser: 1.15 mg/dL (ref 0.61–1.24)
GFR, Estimated: >60 mL/min (ref >60)
Glucose, Bld: 86 mg/dL (ref 70–99)
Potassium: 4.4 mmol/L (ref 3.5–5.1)
Sodium: 140 mmol/L (ref 135–145)

## 2024-08-12 LAB — ECHOCARDIOGRAM COMPLETE
Area-P 1/2: 4.49 cm2
Calc EF: 63.5 %
Height: 67 in
S' Lateral: 3.3 cm
Single Plane A2C EF: 52.1 %
Single Plane A4C EF: 72.5 %
Weight: 2963.2 [oz_av]

## 2024-08-12 LAB — CBC
HCT: 35.3 % — ABNORMAL LOW (ref 39.0–52.0)
HCT: 35.9 % — ABNORMAL LOW (ref 39.0–52.0)
Hemoglobin: 10.8 g/dL — ABNORMAL LOW (ref 13.0–17.0)
Hemoglobin: 11.2 g/dL — ABNORMAL LOW (ref 13.0–17.0)
MCH: 25 pg — ABNORMAL LOW (ref 26.0–34.0)
MCH: 25.2 pg — ABNORMAL LOW (ref 26.0–34.0)
MCHC: 30.6 g/dL (ref 30.0–36.0)
MCHC: 31.2 g/dL (ref 30.0–36.0)
MCV: 81.7 fL (ref 80.0–100.0)
MCV: 80.9 fL (ref 80.0–100.0)
Platelets: 282 K/uL (ref 150–400)
Platelets: 327 K/uL (ref 150–400)
RBC: 4.32 MIL/uL (ref 4.22–5.81)
RBC: 4.44 MIL/uL (ref 4.22–5.81)
RDW: 19.2 % — ABNORMAL HIGH (ref 11.5–15.5)
RDW: 19.1 % — ABNORMAL HIGH (ref 11.5–15.5)
WBC: 4.8 K/uL (ref 4.0–10.5)
WBC: 10.5 K/uL (ref 4.0–10.5)
nRBC: 0 % (ref 0.0–0.2)
nRBC: 0 % (ref 0.0–0.2)

## 2024-08-12 LAB — HIV ANTIBODY (ROUTINE TESTING W REFLEX): HIV Screen 4th Generation wRfx: NONREACTIVE

## 2024-08-12 MED ORDER — SODIUM CHLORIDE 0.9 % IV BOLUS
500.0000 mL | Freq: Once | INTRAVENOUS | Status: AC | PRN
  Administered 2024-08-12: 500 mL via INTRAVENOUS

## 2024-08-12 MED ORDER — GUAIFENESIN 100 MG/5ML PO LIQD: 5.0000 mL | ORAL | Status: DC | PRN

## 2024-08-12 MED ORDER — TRAZODONE HCL 50 MG PO TABS: 50.0000 mg | ORAL_TABLET | Freq: Every evening | ORAL | Status: DC | PRN

## 2024-08-12 MED ORDER — NA SULFATE-K SULFATE-MG SULF 17.5-3.13-1.6 GM/177ML PO SOLN
0.5000 | Freq: Once | ORAL | Status: AC
  Administered 2024-08-12: 177 mL via ORAL
  Filled 2024-08-12: qty 1

## 2024-08-12 MED ORDER — FERROUS SULFATE 325 (65 FE) MG PO TABS
325.0000 mg | ORAL_TABLET | Freq: Every day | ORAL | Status: DC
  Administered 2024-08-14: 325 mg via ORAL
  Filled 2024-08-12: qty 1

## 2024-08-12 MED ORDER — PROCHLORPERAZINE EDISYLATE 10 MG/2ML IJ SOLN
5.0000 mg | Freq: Four times a day (QID) | INTRAMUSCULAR | Status: DC | PRN
  Administered 2024-08-12: 5 mg via INTRAVENOUS
  Filled 2024-08-12: qty 2

## 2024-08-12 MED ORDER — IRON SUCROSE 400 MG IVPB - SIMPLE MED
400.0000 mg | Freq: Once | Status: AC
  Administered 2024-08-12: 400 mg via INTRAVENOUS
  Filled 2024-08-12: qty 270

## 2024-08-12 MED ORDER — SODIUM CHLORIDE 0.9 % IV SOLN: INTRAVENOUS | Status: DC

## 2024-08-12 MED ORDER — ADULT MULTIVITAMIN W/MINERALS CH
1.0000 | ORAL_TABLET | Freq: Every day | ORAL | Status: DC
  Administered 2024-08-12 – 2024-08-14 (×2): 1 via ORAL
  Filled 2024-08-12 (×2): qty 1

## 2024-08-12 MED ORDER — NA SULFATE-K SULFATE-MG SULF 17.5-3.13-1.6 GM/177ML PO SOLN
0.5000 | Freq: Once | ORAL | Status: AC
  Administered 2024-08-13: 177 mL via ORAL

## 2024-08-12 MED ORDER — ORAL CARE MOUTH RINSE: 15.0000 mL | OROMUCOSAL | Status: DC | PRN

## 2024-08-12 MED ORDER — GLUCAGON HCL RDNA (DIAGNOSTIC) 1 MG IJ SOLR: 1.0000 mg | INTRAMUSCULAR | Status: DC | PRN

## 2024-08-12 MED ORDER — HYDRALAZINE HCL 20 MG/ML IJ SOLN: 10.0000 mg | INTRAMUSCULAR | Status: DC | PRN

## 2024-08-12 MED ORDER — IPRATROPIUM-ALBUTEROL 0.5-2.5 (3) MG/3ML IN SOLN: 3.0000 mL | RESPIRATORY_TRACT | Status: DC | PRN

## 2024-08-12 MED ORDER — BOOST / RESOURCE BREEZE PO LIQD CUSTOM
1.0000 | Freq: Three times a day (TID) | ORAL | Status: DC
  Administered 2024-08-12: 1 via ORAL

## 2024-08-12 MED ORDER — SENNOSIDES-DOCUSATE SODIUM 8.6-50 MG PO TABS: 2.0000 | ORAL_TABLET | Freq: Every day | ORAL | Status: DC

## 2024-08-12 NOTE — Telephone Encounter (Signed)
 Patient was admitted to [facility] yesterday and our team will see him in consult.  I expect his EGD and colonoscopy will be done inpatient.  Please cancel the colonoscopy scheduled for tomorrow and the EGD on 08/18/24  Thank you  - H. Danis

## 2024-08-12 NOTE — Progress Notes (Signed)
 Initial Nutrition Assessment  DOCUMENTATION CODES:   Non-severe (moderate) malnutrition in context of chronic illness  INTERVENTION:  - Clear Liquid diet per MD.  - Boost Breeze po TID, each supplement provides 250 kcal and 9 grams of protein - Encourage intake at all meals and of supplements.  - Multivitamin with minerals daily.  - Monitor weight trends.  NUTRITION DIAGNOSIS:   Moderate Malnutrition related to chronic illness as evidenced by moderate fat depletion, moderate muscle depletion.  GOAL:   Patient will meet greater than or equal to 90% of their needs  MONITOR:   PO intake, Supplement acceptance, Diet advancement, Weight trends  REASON FOR ASSESSMENT:   Consult Assessment of nutrition requirement/status  ASSESSMENT:   58 y.o. male with PMH of laparoscopic gastric banding, melena, chronic abdominal pain, anxiety, non-insulin-dependent DM 2, HTN who is presented for progressive weight loss, weakness, and syncope and admitted for upper GI bleed.   Patient in bed at time of visit. He reports a UBW between 180-200# and notes his weight tends to fluctuate depending on his laparoscopic gastric band. Per chart review, weight has fluctuated between 184-196# over the past year.   Patient reports he only really picks at meals. Eats more small and frequent meals as this is better tolerated with his lap band. Notes he has a difficult time eating certain foods so often just eats the same, soft foods as he knows he will tolerate it.  Patient reports he just started taking vitamins 1-2 months ago. Has been taking a multivitamin, vitamin B12, folic acid, vitamin C, and iron.  He recently had all fluid removed from his lap band by Dr. Tanda on 11/13. At that time, plan was made to try and remove the band. It was scheduled for 12/4 but this was cancelled due to patient's admission.   Patient on clear liquids today. Ordered apple juice and chicken broth for lunch. He is agreeable to  try Boost breeze to support intake today. Plan for patient to be NPO for EGD and colonoscopy tomorrow.    Medications reviewed and include: 325mg  ferrous sulfate, Senokot, Protonix   Labs reviewed:  - HA1C 6.3 (as of 9/12)  NUTRITION - FOCUSED PHYSICAL EXAM:  Flowsheet Row Most Recent Value  Orbital Region Moderate depletion  Upper Arm Region Moderate depletion  Thoracic and Lumbar Region Unable to assess  Buccal Region Mild depletion  Temple Region Moderate depletion  Clavicle Bone Region Severe depletion  Clavicle and Acromion Bone Region Moderate depletion  Scapular Bone Region Unable to assess  Dorsal Hand No depletion  Patellar Region No depletion  Anterior Thigh Region No depletion  Posterior Calf Region No depletion  Edema (RD Assessment) None  Hair Reviewed  Eyes Reviewed  Mouth Reviewed  Skin Reviewed  Nails Reviewed    Diet Order:   Diet Order             Diet NPO time specified  Diet effective midnight           Diet clear liquid Fluid consistency: Thin  Diet effective now                   EDUCATION NEEDS:  Education needs have been addressed  Skin:  Skin Assessment: Reviewed RN Assessment  Last BM:  12/1  Height:  Ht Readings from Last 1 Encounters:  08/11/24 5' 7 (1.702 m)   Weight:  Wt Readings from Last 1 Encounters:  08/11/24 84 kg   Ideal Body Weight:  67.27  kg  BMI:  Body mass index is 29.01 kg/m.  Estimated Nutritional Needs:  Kcal:  1950-2100 kcals Protein:  85-100 grams Fluid:  >/= 2L    Trude Ned RD, LDN Contact via Secure Chat.

## 2024-08-12 NOTE — Progress Notes (Signed)
   08/12/24 0922  TOC Brief Assessment  Insurance and Status Reviewed  Patient has primary care physician Yes  Home environment has been reviewed Resides in single family home  Prior level of function: Independent with ADLs at baseline  Prior/Current Home Services No current home services  Social Drivers of Health Review SDOH reviewed no interventions necessary  Readmission risk has been reviewed Yes  Transition of care needs no transition of care needs at this time

## 2024-08-12 NOTE — Consult Note (Addendum)
 Referring Provider: EDP Primary Care Physician:  Theotis Haze ORN, NP Primary Gastroenterologist:  Dr. Legrand  Reason for Consultation:  Anemia, dark stool  HPI: Kevin [name] is a 58 y.o. male with DM, HTN, history of lap bad that has been in place for years.  He presented to [facility] hospital on 12/2 with complaints of weakness and feeling poorly.  He was just seen last week in our office by Dr. Legrand and there are plans for endoscopy and colonoscopy to evaluate his iron deficiency anemia and his GI complaints.  He reports having intermittent dark stools, but has been heme-negative and Dr. Legrand said that the stool did not appear to be black or melenic.  He says that his stools have been different for the past several months, sometimes small skinny.  Was having abdominal pain and upper GI/reflux symptoms, but recently saw Dr. Tanda with surgery and the fluid from his Lap-Band was removed so all the symptoms have resolved.  Continues to complain of significant lower abdominal pain.  Significant other at bedside.  Hgb 10.8 grams which is down from 12.5 grams yesterday but up from 9.9 grams 13 days ago.  Iron studies low.    He continues to focus on the fact that he thinks he has colon cancer.  He has had 3 CT scans since November with no suggestion of this or any other concerning GI issues.  Does have IV iron in the form of Venofer has been ordered for him to receive here.  No colonoscopy since about 2010 and no EGD in years.  Father had colon cancer.   Past Medical History:  Diagnosis Date   COVID 04/20/2020   Diabetes mellitus without complication (HCC)    GERD (gastroesophageal reflux disease)    Hypertension    MRSA (methicillin resistant Staphylococcus aureus)     Past Surgical History:  Procedure Laterality Date   BACK SURGERY     LAPAROSCOPIC GASTRIC BANDING     TRIGGER FINGER RELEASE Right 10/05/2020   Procedure: RELEASE TRIGGER FINGER RIGHT LONG FINGER;  Surgeon: Jerri Kay HERO,  MD;  Location: [facility];  Service: Orthopedics;  Laterality: Right;    Prior to Admission medications   Medication Sig Start Date End Date Taking? Authorizing Provider  amLODipine  (NORVASC ) 10 MG tablet Take 1 tablet (10 mg total) by mouth daily. 05/22/24  Yes Fleming, Zelda W, NP  ascorbic acid (VITAMIN C) 500 MG tablet Take 500-1,000 mg by mouth daily.   Yes [provider]  atorvastatin  (LIPITOR) 40 MG tablet Take 1 tablet (40 mg total) by mouth daily. 05/22/24  Yes Fleming, Zelda W, NP  Cyanocobalamin (VITAMIN B12 PO) Take 1 tablet by mouth daily.   Yes [provider]  folic acid (FOLVITE) 800 MCG tablet Take 800 mcg by mouth daily.   Yes [provider]  glimepiride  (AMARYL ) 4 MG tablet Take 1 tablet (4 mg total) by mouth daily with breakfast. 05/22/24  Yes Fleming, Zelda W, NP  JANUMET  50-1000 MG tablet Take 1 tablet by mouth 2 (two) times daily with a meal. Patient taking differently: Take 1 tablet by mouth See admin instructions. Take 20 mg by mouth with meals in the morning and evening 05/22/24  Yes Fleming, Zelda W, NP  omeprazole  (PRILOSEC) 20 MG capsule Take 1 capsule (20 mg total) by mouth 2 (two) times daily before a meal. Patient taking differently: Take 20 mg by mouth See admin instructions. Take 20 mg by mouth 30 minutes  before meals in the morning and evening 02/18/24  Yes Fleming, Zelda W, NP  valsartan  (DIOVAN ) 160 MG tablet Take 1 tablet (160 mg total) by mouth daily. 05/22/24  Yes Theotis Haze ORN, NP  ferrous sulfate  325 (65 FE) MG tablet Take 325 mg by mouth daily with breakfast. Patient not taking: Reported on 08/11/2024    [provider]  glucose blood (TRUE METRIX BLOOD GLUCOSE TEST) test strip Use as instructed. Check blood glucose level by fingerstick twice per day. 11/19/23   Fleming, Zelda W, NP  tadalafil  (CIALIS ) 10 MG tablet Take 1 tablet (10 mg total) by mouth every other day as needed for erectile dysfunction. And  urinary frequency Patient not taking: Reported on 08/11/2024 07/13/24   Fleming, Zelda W, NP  TRUEplus Lancets 28G MISC Use as instructed. Check blood glucose level by fingerstick twice per day. 11/19/23   Fleming, Zelda W, NP    Current Facility-Administered Medications  Medication Dose Route Frequency Provider Last Rate Last Admin   acetaminophen  (TYLENOL ) tablet 650 mg  650 mg Oral Q6H PRN Zella, Mir M, MD       Or   acetaminophen  (TYLENOL ) suppository 650 mg  650 mg Rectal Q6H PRN Zella, Mir M, MD       amLODipine  (NORVASC ) tablet 10 mg  10 mg Oral Daily Zella, Mir M, MD       atorvastatin  (LIPITOR) tablet 40 mg  40 mg Oral Daily Zella, Mir M, MD       NOREEN ON 08/13/2024] ferrous sulfate  tablet 325 mg  325 mg Oral Q breakfast Amin, Ankit C, MD       glucagon  (human recombinant) (GLUCAGEN) injection 1 mg  1 mg Intravenous PRN Amin, Ankit C, MD       guaiFENesin  (ROBITUSSIN) 100 MG/5ML liquid 5 mL  5 mL Oral Q4H PRN Amin, Ankit C, MD       hydrALAZINE  (APRESOLINE ) injection 10 mg  10 mg Intravenous Q4H PRN Amin, Ankit C, MD       HYDROmorphone  (DILAUDID ) injection 0.5 mg  0.5 mg Intravenous Q4H PRN Daniels, James K, NP   0.5 mg at 08/11/24 2209   insulin  aspart (novoLOG ) injection 0-15 Units  0-15 Units Subcutaneous TID WC Zella, Mir M, MD   2 Units at 08/12/24 9150   insulin  aspart (novoLOG ) injection 0-5 Units  0-5 Units Subcutaneous QHS Zella, Mir M, MD       ipratropium-albuterol  (DUONEB) 0.5-2.5 (3) MG/3ML nebulizer solution 3 mL  3 mL Nebulization Q4H PRN Amin, Ankit C, MD       iron  sucrose (VENOFER ) 400 mg in sodium chloride  0.9 % 250 mL IVPB  400 mg Intravenous Once Amin, Ankit C, MD       ondansetron  (ZOFRAN ) tablet 4 mg  4 mg Oral Q6H PRN Zella, Mir M, MD       Or   ondansetron  (ZOFRAN ) injection 4 mg  4 mg Intravenous Q6H PRN Zella, Mir M, MD       Oral care mouth rinse  15 mL Mouth Rinse PRN Zella, Mir M, MD       pantoprazole   (PROTONIX ) EC tablet 40 mg  40 mg Oral Daily Ikramullah, Mir M, MD       senna-docusate (Senokot-S) tablet 2 tablet  2 tablet Oral QHS Amin, Ankit C, MD       traZODone  (DESYREL ) tablet 50 mg  50 mg Oral QHS PRN Amin, Ankit C, MD  Allergies as of 08/11/2024 - Review Complete 08/11/2024  Allergen Reaction Noted   Nsaids Other (See Comments) 07/30/2024   Tramadol  Other (See Comments) 09/21/2020    Family History  Problem Relation Age of Onset   Heart disease Mother    Hypertension Mother    Diabetes Sister    Hypertension Sister    Stroke Brother    Diabetes Brother    Heart disease Brother    Heart attack Brother    Hypertension Brother    Hypertension Father     Social History   Socioeconomic History   Marital status: Divorced    Spouse name: Not on file   Number of children: Not on file   Years of education: Not on file   Highest education level: Not on file  Occupational History   Not on file  Tobacco Use   Smoking status: Former    Current packs/day: 0.00    Types: Cigarettes    Start date: 36    Quit date: 2000    Years since quitting: 25.9   Smokeless tobacco: Never  Vaping Use   Vaping status: Never Used  Substance and Sexual Activity   Alcohol use: No   Drug use: No   Sexual activity: Yes  Other Topics Concern   Not on file  Social History Narrative   Not on file   Social Drivers of Health   Financial Resource Strain: Low Risk  (07/22/2023)   Overall Financial Resource Strain (CARDIA)    Difficulty of Paying Living Expenses: Not hard at all  Food Insecurity: No Food Insecurity (08/11/2024)   Hunger Vital Sign    Worried About Running Out of Food in the Last Year: Never true    Ran Out of Food in the Last Year: Never true  Transportation Needs: No Transportation Needs (08/11/2024)   PRAPARE - Administrator, Civil Service (Medical): No    Lack of Transportation (Non-Medical): No  Physical Activity: Inactive (07/22/2023)    Exercise Vital Sign    Days of Exercise per Week: 0 days    Minutes of Exercise per Session: 0 min  Stress: No Stress Concern Present (07/22/2023)   [employer] of Occupational Health - Occupational Stress Questionnaire    Feeling of Stress : Not at all  Social Connections: Socially Isolated (07/22/2023)   Social Connection and Isolation Panel    Frequency of Communication with Friends and Family: Twice a week    Frequency of Social Gatherings with Friends and Family: More than three times a week    Attends Religious Services: Never    Database Administrator or Organizations: No    Attends Banker Meetings: Never    Marital Status: Divorced  Catering Manager Violence: Not At Risk (08/11/2024)   Humiliation, Afraid, Rape, and Kick questionnaire    Fear of Current or Ex-Partner: No    Emotionally Abused: No    Physically Abused: No    Sexually Abused: No    Review of Systems: ROS is O/W negative except as mentioned in HPI.  Physical Exam: Vital signs in last 24 hours: Temp:  [97.9 F (36.6 C)-98.6 F (37 C)] 97.9 F (36.6 C) (12/03 0744) Pulse Rate:  [67-104] 90 (12/03 0744) Resp:  [12-21] 17 (12/03 0744) BP: (111-171)/(69-101) 135/84 (12/03 0744) SpO2:  [96 %-100 %] 97 % (12/03 0744) Weight:  [84 kg] 84 kg (12/02 2009) Last BM Date : 08/10/24 General:  Alert, Well-developed, well-nourished, in NAD Head:  Normocephalic and atraumatic. Eyes:  Sclera clear, no icterus.  Conjunctiva pink. Ears:  Normal auditory acuity. Mouth:  No deformity or lesions.   Lungs:  Clear throughout to auscultation.  No wheezes, crackles, or rhonchi. Heart:  Regular rate and rhythm; no murmurs, clicks, rubs, or gallops. Abdomen:  Soft, non-distended.  BS present.  Some lower abdominal TTP. Msk:  Symmetrical without gross deformities. Pulses:  Normal pulses noted. Extremities:  Without clubbing or edema. Neurologic:  Alert and oriented x 4;  grossly normal neurologically. Skin:   Intact without significant lesions or rashes. Psych:  Alert and cooperative. Normal mood and affect.  Intake/Output from previous day: 12/02 0701 - 12/03 0700 In: 2000 [IV Piggyback:2000] Out: -   Lab Results: Recent Labs    08/11/24 1313 08/12/24 0343  WBC 6.7 4.8  HGB 12.5* 10.8*  HCT 38.7* 35.3*  PLT 342 282   BMET Recent Labs    08/11/24 1313 08/12/24 0343  NA 135 140  K 4.1 4.4  CL 98 105  CO2 21* 24  GLUCOSE 117* 86  BUN 10 8  CREATININE 1.11 1.15  CALCIUM  9.7 9.3   LFT Recent Labs    08/11/24 1313  PROT 8.1  ALBUMIN 4.9  AST 28  ALT 28  ALKPHOS 92  BILITOT 0.5   Studies/Results: CT Head Wo Contrast Result Date: 08/11/2024 EXAM: CT HEAD WITHOUT CONTRAST 08/11/2024 02:55:24 PM TECHNIQUE: CT of the head was performed without the administration of intravenous contrast. Automated exposure control, iterative reconstruction, and/or weight based adjustment of the mA/kV was utilized to reduce the radiation dose to as low as reasonably achievable. COMPARISON: 05/03/2017 CLINICAL HISTORY: Confusion, unwitnessed fall earlier. FINDINGS: BRAIN AND VENTRICLES: No acute hemorrhage. No evidence of acute infarct. No hydrocephalus. No extra-axial collection. No mass effect or midline shift. ORBITS: No acute abnormality. SINUSES: No acute abnormality. SOFT TISSUES AND SKULL: No acute soft tissue abnormality. No skull fracture. IMPRESSION: 1. No acute intracranial abnormality. Electronically signed by: Franky Crease MD 08/11/2024 03:39 PM EST RP Workstation: [id number]   CT Angio Chest/Abd/Pel for Dissection W and/or W/WO Result Date: 08/11/2024 EXAM: CTA CHEST, ABDOMEN AND PELVIS WITHOUT AND WITH CONTRAST 08/11/2024 02:55:24 PM TECHNIQUE: CTA of the chest was performed without and with the administration of intravenous contrast. CTA of the abdomen and pelvis was performed without and with the administration of intravenous contrast. 100 mL iohexol  (OMNIPAQUE ) 350 MG/ML injection 100  mL IOHEXOL  350 MG/ML SOLN was administered. Multiplanar reformatted images are provided for review. MIP images are provided for review. Automated exposure control, iterative reconstruction, and/or weight based adjustment of the mA/kV was utilized to reduce the radiation dose to as low as reasonably achievable. COMPARISON: CT abdomen and pelvis 07/30/2024. CT angio chest 07/05/2024. CLINICAL HISTORY: GI bleed. FINDINGS: VASCULATURE: AORTA: Scattered aortic atherosclerosis. No aneurysm or dissection. PULMONARY ARTERIES: No pulmonary embolism with the limits of this exam. GREAT VESSELS OF AORTIC ARCH: No acute finding. No dissection. No arterial occlusion or significant stenosis. CELIAC TRUNK: No acute finding. No occlusion or significant stenosis. SUPERIOR MESENTERIC ARTERY: No acute finding. No occlusion or significant stenosis. INFERIOR MESENTERIC ARTERY: No acute finding. No occlusion or significant stenosis. RENAL ARTERIES: No acute finding. No occlusion or significant stenosis. ILIAC ARTERIES: No acute finding. No occlusion or significant stenosis. CHEST: MEDIASTINUM: Extensive 3 vessel coronary artery disease. No mediastinal lymphadenopathy. The heart and pericardium demonstrate no acute abnormality. LUNGS AND PLEURA: Small scattered pulmonary nodules, index nodule in the posterior left lower lobe measures 6  mm on image 93 compared to 5 mm previously, not significantly changed. Index posterior right lower lobe nodule on image 87 measures 4 mm, stable. No new or significantly enlarging nodule. No focal consolidation or pulmonary edema. No evidence of pleural effusion or pneumothorax. THORACIC BONES AND SOFT TISSUES: No acute bone or soft tissue abnormality. ABDOMEN AND PELVIS: LIVER: The liver is unremarkable. GALLBLADDER AND BILE DUCTS: Gallbladder is unremarkable. No biliary ductal dilatation. SPLEEN: The spleen is unremarkable. PANCREAS: The pancreas is unremarkable. ADRENAL GLANDS: Bilateral adrenal glands  demonstrate no acute abnormality. KIDNEYS, URETERS AND BLADDER: No stones in the kidneys or ureters. No hydronephrosis. No perinephric or periureteral stranding. Urinary bladder is unremarkable. GI AND BOWEL: Lap band noted in place around the proximal stomach. This is unchanged. Stomach and duodenal sweep demonstrate no acute abnormality. There is no bowel obstruction. No abnormal bowel wall thickening or distension. REPRODUCTIVE: Reproductive organs are unremarkable. PERITONEUM AND RETROPERITONEUM: No ascites or free air. LYMPH NODES: No lymphadenopathy. ABDOMINAL BONES AND SOFT TISSUES: No acute abnormality of the bones. No acute soft tissue abnormality. IMPRESSION: 1. No evidence of aortic aneurysm or dissection. 2. No evidence of pulmonary embolism. 3. Extensive coronary artery disease. 4. Small scattered pulmonary nodules, with the largest in the posterior left lower lobe measuring 6 mm and without significant interval change; management per Fleischner Society Guidelines is recommended with follow-up chest CT in 1 year. Electronically signed by: Franky Crease MD 08/11/2024 03:38 PM EST RP Workstation: [id number]   IMPRESSION:  *Weakness and IDA:  Hgb 10.8 grams which is down from 12.5 grams yesterday but up from 9.9 grams 13 days ago.  Iron studies low.  Reports concern of dark stool but has been heme negative and did not appear black/melena to Dr. Legrand last week. *Change in bowel habits over the past several months *Abdominal pain:  Upper GI symptoms have resolved since removing the fluid from his lap band a few weeks ago. *FH of colon cancer in his father  PLAN: -Monitor Hgb and transfuse prn. -EGD and colonoscopy on 12/4.  Clear liquids today then NPO after midnight except for bowel prep. -He is on pantoprazole  40 mg PO daily here, which is fine for now.  Can change/increase if needed pending results of EGD. -Looks like a dose of Venofer has been ordered here by the hospitalist.   Harlene BIRCH.  Zehr  08/12/2024, 9:24 AM      Attending Physician's Attestation   I have taken an interval history, reviewed the chart and examined the patient.   This is a patient that the GI service is asked to evaluate in the setting of progressive iron deficiency anemia and weakness and fatigue and systemic symptoms of lightheadedness who was recently evaluated by my partner Dr. Legrand with plans for outpatient endoscopic evaluation actually part of it later this week and into next week.  Comes into the hospital with progressive abdominal discomfort.  He states that even after his Lap-Band was released, there was no change in any of his GI symptoms which are mostly lower abdominal pain/discomfort that he tolerates.  He does not think that the Lap-Band has anything to do with things.  He was worried that he may have a colon cancer.  He is worried that we will plan to discharge him from the hospital without a clear etiology or source.  On physical exam he has a protuberant abdomen with palpation of a lap band in the left upper quadrant region.  He has abdominal discomfort  upon deep palpation with abdominal distention noted.  Changes in his bowel habits have occurred over the course of the last few months.  Etiology of his iron deficiency anemia is not clear, but we worry about an underlying cancer process based on his changes in bowel habits and anemia.  Will plan upper and lower endoscopy tomorrow.  The risks and benefits of endoscopic evaluation/treatment were discussed with the patient and/or family; these include but are not limited to the risk of perforation, infection, bleeding, missed lesions, lack of diagnosis, severe illness requiring hospitalization, as well as anesthesia and sedation related illnesses.  The patient's history has been reviewed, patient examined, no change in status, and deemed stable for procedure.  The patient and/or family was provided an opportunity to ask questions and all were answered.   The patient and/or family is agreeable to proceed.  All patient questions were answered to the best of my ability, and the patient agrees to the aforementioned plan of action with follow-up as indicated.   I agree with the Advanced Practitioner's note, impression, and recommendations with updates and my documentation as noted above.  The majority of the medical decision making/process, formulation of the impression/plan of action for the patient were performed by me with substantive portion of this encounter (>50% time spent including complete performance of at least one of the key components of MDM, History, and/or Exam).   Aloha Finner, MD [facility] Advanced [facility]oscopy Office # [phone]

## 2024-08-12 NOTE — Plan of Care (Signed)

## 2024-08-12 NOTE — Progress Notes (Signed)
 PROGRESS NOTE    Kevin [name]  FMW:[id number] DOB: 09-29-1965 DOA: 08/11/2024 PCP: Theotis Haze ORN, NP    Brief Narrative:   58 year old with history of melena, chronic abdominal pain, anxiety, non-insulin-dependent DM 2, HTN admitted to the hospital for progressive weight loss, weakness and syncope.  He has dealt with episodes of melena over the last decade secondary to abnormal polyps.  He follows outpatient I will be GI and was scheduled for colonoscopy on 12/4 but due to syncopal event he ended up coming to the hospital.  Assessment & Plan:  Syncope - Possibly in the setting of GI bleed.  Echocardiogram in 2019 showed preserved ejection fraction, will go ahead and update this -CT head negative - [facility]  GI bleed Acute on chronic blood loss anemia Iron deficiency with microcytosis - Plans for EGD/colonoscopy tomorrow. - Hemoglobin currently around baseline of 11.0 - Iron studies showing low saturation therefore will give IV iron and will refer for outpatient infusion  Laparoscopic gastric band -Initially placed in 2006, recently laparoscopic band fluid was removed with some relief in his symptoms.  Plan is to eventually remove the band.   Non-insulin-dependent type 2 diabetes-well-controlled, last A1c 6.3. -Sliding scale and Accu-Cheks   Hypertension -Holding ARB.  Continue Norvasc .  IV as needed  Progressive weight loss and weakness - Dietitian consulted.  CT chest abdomen pelvis does not show any occult malignancy but obviously this is not the best test therefore recommend outpatient follow-up with PCP to make sure he is up-to-date with all of his screenings.  Pulmonary nodule - Multiple nodules including one 6 mm.  Recommend outpatient follow-up and a repeat scan in 6 months to 1 year  DVT prophylaxis: SCDs Start: 08/11/24 1833    Code Status: Full Code Family Communication: Spouse at bedside Continue hospital stay   PT Follow up Recs:   Subjective: Tells  me his melanotic stool has been off and on over last several months.  Initially prior to his gastric lap band release he was having some reflux type symptoms but now this is improved.  He has also stopped taking his omeprazole .   Examination:  General exam: Appears calm and comfortable  Respiratory system: Clear to auscultation. Respiratory effort normal. Cardiovascular system: S1 & S2 heard, RRR. No JVD, murmurs, rubs, gallops or clicks. No pedal edema. Gastrointestinal system: Abdomen is nondistended, soft and nontender. No organomegaly or masses felt. Normal bowel sounds heard. Central nervous system: Alert and oriented. No focal neurological deficits. Extremities: Symmetric 5 x 5 power. Skin: No rashes, lesions or ulcers Psychiatry: Judgement and insight appear normal. Mood & affect appropriate.                Diet Orders (From admission, onward)     Start     Ordered   08/13/24 0001  Diet NPO time specified  Diet effective midnight        08/12/24 1028   08/12/24 1029  Diet clear liquid Fluid consistency: Thin  Diet effective now       Question:  Fluid consistency:  Answer:  Thin   08/12/24 1028            Objective: Vitals:   08/11/24 2009 08/12/24 0006 08/12/24 0427 08/12/24 0744  BP: (!) 171/87 112/70 111/69 135/84  Pulse: 88 70 67 90  Resp: 18 18 18 17   Temp: 98.6 F (37 C) 98.1 F (36.7 C) 98 F (36.7 C) 97.9 F (36.6 C)  TempSrc: Oral Oral Oral Oral  SpO2: 100% 98% 96% 97%  Weight: 84 kg     Height: 5' 7 (1.702 m)       Intake/Output Summary (Last 24 hours) at 08/12/2024 1029 Last data filed at 08/12/2024 0243 Gross per 24 hour  Intake 2000 ml  Output --  Net 2000 ml   Filed Weights   08/11/24 2009  Weight: 84 kg    Scheduled Meds:  amLODipine   10 mg Oral Daily   atorvastatin   40 mg Oral Daily   [START ON 08/13/2024] ferrous sulfate   325 mg Oral Q breakfast   insulin  aspart  0-15 Units Subcutaneous TID WC   insulin  aspart  0-5 Units  Subcutaneous QHS   pantoprazole   40 mg Oral Daily   senna-docusate  2 tablet Oral QHS   Continuous Infusions:  iron  sucrose      Nutritional status     Body mass index is 29.01 kg/m.  Data Reviewed:   CBC: Recent Labs  Lab 08/11/24 1313 08/12/24 0343  WBC 6.7 4.8  NEUTROABS 3.9  --   HGB 12.5* 10.8*  HCT 38.7* 35.3*  MCV 78.5* 81.7  PLT 342 282   Basic Metabolic Panel: Recent Labs  Lab 08/11/24 1313 08/12/24 0343  NA 135 140  K 4.1 4.4  CL 98 105  CO2 21* 24  GLUCOSE 117* 86  BUN 10 8  CREATININE 1.11 1.15  CALCIUM  9.7 9.3   GFR: Estimated Creatinine Clearance: 72.6 mL/min (by C-G formula based on SCr of 1.15 mg/dL). Liver Function Tests: Recent Labs  Lab 08/11/24 1313  AST 28  ALT 28  ALKPHOS 92  BILITOT 0.5  PROT 8.1  ALBUMIN 4.9   No results for input(s): LIPASE, AMYLASE in the last 168 hours. Recent Labs  Lab 08/11/24 1514  AMMONIA 19   Coagulation Profile: No results for input(s): INR, PROTIME in the last 168 hours. Cardiac Enzymes: No results for input(s): CKTOTAL, CKMB, CKMBINDEX, TROPONINI in the last 168 hours. BNP (last 3 results) Recent Labs    07/05/24 0956  PROBNP 85.5   HbA1C: No results for input(s): HGBA1C in the last 72 hours. CBG: Recent Labs  Lab 08/11/24 1403 08/11/24 2145 08/12/24 0746  GLUCAP 110* 84 126*   Lipid Profile: No results for input(s): CHOL, HDL, LDLCALC, TRIG, CHOLHDL, LDLDIRECT in the last 72 hours. Thyroid  Function Tests: No results for input(s): TSH, T4TOTAL, FREET4, T3FREE, THYROIDAB in the last 72 hours. Anemia Panel: No results for input(s): VITAMINB12, FOLATE, FERRITIN, TIBC, IRON , RETICCTPCT in the last 72 hours. Sepsis Labs: No results for input(s): PROCALCITON, LATICACIDVEN in the last 168 hours.  No results found for this or any previous visit (from the past 240 hours).       Radiology Studies: CT Head Wo Contrast Result  Date: 08/11/2024 EXAM: CT HEAD WITHOUT CONTRAST 08/11/2024 02:55:24 PM TECHNIQUE: CT of the head was performed without the administration of intravenous contrast. Automated exposure control, iterative reconstruction, and/or weight based adjustment of the mA/kV was utilized to reduce the radiation dose to as low as reasonably achievable. COMPARISON: 05/03/2017 CLINICAL HISTORY: Confusion, unwitnessed fall earlier. FINDINGS: BRAIN AND VENTRICLES: No acute hemorrhage. No evidence of acute infarct. No hydrocephalus. No extra-axial collection. No mass effect or midline shift. ORBITS: No acute abnormality. SINUSES: No acute abnormality. SOFT TISSUES AND SKULL: No acute soft tissue abnormality. No skull fracture. IMPRESSION: 1. No acute intracranial abnormality. Electronically signed by: Franky Crease MD 08/11/2024 03:39 PM EST RP Workstation: [id number]   CT Angio Chest/Abd/Pel  for Dissection W and/or W/WO Result Date: 08/11/2024 EXAM: CTA CHEST, ABDOMEN AND PELVIS WITHOUT AND WITH CONTRAST 08/11/2024 02:55:24 PM TECHNIQUE: CTA of the chest was performed without and with the administration of intravenous contrast. CTA of the abdomen and pelvis was performed without and with the administration of intravenous contrast. 100 mL iohexol  (OMNIPAQUE ) 350 MG/ML injection 100 mL IOHEXOL  350 MG/ML SOLN was administered. Multiplanar reformatted images are provided for review. MIP images are provided for review. Automated exposure control, iterative reconstruction, and/or weight based adjustment of the mA/kV was utilized to reduce the radiation dose to as low as reasonably achievable. COMPARISON: CT abdomen and pelvis 07/30/2024. CT angio chest 07/05/2024. CLINICAL HISTORY: GI bleed. FINDINGS: VASCULATURE: AORTA: Scattered aortic atherosclerosis. No aneurysm or dissection. PULMONARY ARTERIES: No pulmonary embolism with the limits of this exam. GREAT VESSELS OF AORTIC ARCH: No acute finding. No dissection. No arterial occlusion or  significant stenosis. CELIAC TRUNK: No acute finding. No occlusion or significant stenosis. SUPERIOR MESENTERIC ARTERY: No acute finding. No occlusion or significant stenosis. INFERIOR MESENTERIC ARTERY: No acute finding. No occlusion or significant stenosis. RENAL ARTERIES: No acute finding. No occlusion or significant stenosis. ILIAC ARTERIES: No acute finding. No occlusion or significant stenosis. CHEST: MEDIASTINUM: Extensive 3 vessel coronary artery disease. No mediastinal lymphadenopathy. The heart and pericardium demonstrate no acute abnormality. LUNGS AND PLEURA: Small scattered pulmonary nodules, index nodule in the posterior left lower lobe measures 6 mm on image 93 compared to 5 mm previously, not significantly changed. Index posterior right lower lobe nodule on image 87 measures 4 mm, stable. No new or significantly enlarging nodule. No focal consolidation or pulmonary edema. No evidence of pleural effusion or pneumothorax. THORACIC BONES AND SOFT TISSUES: No acute bone or soft tissue abnormality. ABDOMEN AND PELVIS: LIVER: The liver is unremarkable. GALLBLADDER AND BILE DUCTS: Gallbladder is unremarkable. No biliary ductal dilatation. SPLEEN: The spleen is unremarkable. PANCREAS: The pancreas is unremarkable. ADRENAL GLANDS: Bilateral adrenal glands demonstrate no acute abnormality. KIDNEYS, URETERS AND BLADDER: No stones in the kidneys or ureters. No hydronephrosis. No perinephric or periureteral stranding. Urinary bladder is unremarkable. GI AND BOWEL: Lap band noted in place around the proximal stomach. This is unchanged. Stomach and duodenal sweep demonstrate no acute abnormality. There is no bowel obstruction. No abnormal bowel wall thickening or distension. REPRODUCTIVE: Reproductive organs are unremarkable. PERITONEUM AND RETROPERITONEUM: No ascites or free air. LYMPH NODES: No lymphadenopathy. ABDOMINAL BONES AND SOFT TISSUES: No acute abnormality of the bones. No acute soft tissue abnormality.  IMPRESSION: 1. No evidence of aortic aneurysm or dissection. 2. No evidence of pulmonary embolism. 3. Extensive coronary artery disease. 4. Small scattered pulmonary nodules, with the largest in the posterior left lower lobe measuring 6 mm and without significant interval change; management per Fleischner Society Guidelines is recommended with follow-up chest CT in 1 year. Electronically signed by: Franky Crease MD 08/11/2024 03:38 PM EST RP Workstation: [id number]           LOS: 0 days   Time spent= 35 mins    Burgess JAYSON Dare, MD [facility]  If 7PM-7AM, please contact night-coverage  08/12/2024, 10:29 AM

## 2024-08-12 NOTE — Progress Notes (Signed)
  Echocardiogram 2D Echocardiogram has been performed.  Devora Ellouise SAUNDERS 08/12/2024, 1:55 PM

## 2024-08-12 NOTE — Telephone Encounter (Signed)
 Auth Submission: NO AUTH NEEDED Site of care: Site of care: [facility] Payer: [facility] Medicaid Medication & CPT/J Code(s) submitted: Feraheme (ferumoxytol) [id number] Diagnosis Code:  Route of submission (phone, fax, portal):  Phone # Fax # Auth type: Buy/Bill PB Units/visits requested: 510mg  x 1 dose Reference number:  Approval from: 08/12/24 to 09/09/24

## 2024-08-12 NOTE — Progress Notes (Signed)
 Chaplain provided Advance Directive paperwork and education. All questions answered at this time. Pt Kevin [name] wishes for his significant other Kevin [name] to serve as primary HCPOA.  Kevin [name] also shared his health journey over the last 20 years. He struggles with regret. Chaplain provided compassionate presence, reflective listening, and encouragement.   He has a big day tomorrow in terms of important medical tests. I will stop by in the morning to facilitate completion of his advance directive and to simply follow up.

## 2024-08-12 NOTE — Telephone Encounter (Signed)
 Patient referred to infusion pharmacy team for ambulatory infusion of IV iron.  Insurance - [place] Medicaid Prepaid Plan  Site of care - Site of care: [facility] Dx code - D50.9/K92.2  IV Iron Therapy - Feraheme 510 mg IV x 1 Infusion appointments - Scheduling team will schedule patient as soon as possible.    Clark Cuff D. Paulla Mcclaskey, PharmD

## 2024-08-12 NOTE — Progress Notes (Signed)
     Patient Name: Kevin [name]           DOB: June 29, 1966  MRN: [id number]      Admission Date: 08/11/2024  Attending Provider: Caleen Burgess BROCKS, MD  Primary Diagnosis: Upper GI bleed   Level of care: Telemetry   OVERNIGHT EVENT  HPI/ Events of Note Kevin [name], 58 y.o. male, was admitted on 08/11/2024 for Upper GI bleed.  Notified by bedside RN that patient had syncopal episode.  Patient was found on the floor facedown.   Syncope -  unwitnessed fall Patient reports getting up to use the restroom after starting bowel prep.  He describes feeling like he was going to pass out when bearing down. He lowered himself to the floor and sat down, shortly after blacking out.  He was awoken by nursing staff who found him on the floor-- face down with a laceration on his nose bridge.   He does report new pain around his forehead.  No active bleeding or hematoma noted to his head or face. There are no other visible sign of injury or deformities aside from new laceration on nose.  Vital signs hemodynamically stable, however RN reports BP was initially low. RR RN started 500 cc bolus. CBG normal.  Patient was able to get up with assistance without complications.   Prior hgb stable, no active bright red rectal bleeding reported.    Plan: CT head - No acute intracranial abnormality. Normal CT of the head for age  Orthostatic vitals cardiac telemetry fluid bolus, 500 cc given by rapid response RN CBC  Nursing staff to continue monitoring for change in acute symptoms, mobility, and pain.   Terrika Zuver, DNP, ACNPC- AG [facility] [place]

## 2024-08-12 NOTE — Telephone Encounter (Signed)
 Appts cancelled. Called and spoke with Kevin [name] to update her that these procedures are being cancelled. She verbalized understanding.

## 2024-08-12 NOTE — Progress Notes (Signed)
 Tried to contact [employer], pt contact and the call when straight to voicemail. I left my number for her to call me back.  I will try again later.

## 2024-08-12 NOTE — Hospital Course (Addendum)
 Brief Narrative:   58 year old with history of melena, chronic abdominal pain, anxiety, non-insulin-dependent DM 2, HTN admitted to the hospital for progressive weight loss, weakness and syncope.  He has dealt with episodes of melena over the last decade secondary to abnormal polyps.  He follows outpatient I will be GI and was scheduled for colonoscopy on 12/4 but due to syncopal event he ended up coming to the hospital. Patient seen by GI with plans for endoscopic evaluation both EGD and colonoscopy.  Assessment & Plan:  Syncope - Possibly in the setting of GI bleed.  Echocardiogram in 2019 showed preserved ejection fraction, will go ahead and update this -CT head negative - Orthostatics  GI bleed Acute on chronic blood loss anemia Iron deficiency with microcytosis - Planned EGD and colonoscopy 12/3 - Hemoglobin currently around baseline of 11.0 - Iron studies showing low saturation therefore will give IV iron and will refer for outpatient infusion  Laparoscopic gastric band -Initially placed in 2006, recently laparoscopic band fluid was removed with some relief in his symptoms.  Plan is to eventually remove the band.   Non-insulin-dependent type 2 diabetes-well-controlled, last A1c 6.3. -Sliding scale and Accu-Cheks   Hypertension -Holding ARB.  Continue Norvasc .  IV as needed  Progressive weight loss and weakness - Dietitian consulted.  CT chest abdomen pelvis does not show any occult malignancy but obviously this is not the best test therefore recommend outpatient follow-up with PCP to make sure he is up-to-date with all of his screenings.  Pulmonary nodule - Multiple nodules including one 6 mm.  Recommend outpatient follow-up and a repeat scan in 6 months to 1 year  DVT prophylaxis: SCDs Start: 08/11/24 1833    Code Status: Full Code Family Communication: Spouse at bedside Continue hospital stay   PT Follow up Recs:   Subjective: Tells me his melanotic stool has been  off and on over last several months.  Initially prior to his gastric lap band release he was having some reflux type symptoms but now this is improved.  He has also stopped taking his omeprazole .   Examination:  General exam: Appears calm and comfortable  Respiratory system: Clear to auscultation. Respiratory effort normal. Cardiovascular system: S1 & S2 heard, RRR. No JVD, murmurs, rubs, gallops or clicks. No pedal edema. Gastrointestinal system: Abdomen is nondistended, soft and nontender. No organomegaly or masses felt. Normal bowel sounds heard. Central nervous system: Alert and oriented. No focal neurological deficits. Extremities: Symmetric 5 x 5 power. Skin: No rashes, lesions or ulcers Psychiatry: Judgement and insight appear normal. Mood & affect appropriate.

## 2024-08-12 NOTE — Significant Event (Signed)
 Rapid Response Event Note   Reason for Call :  Patient found on the floor during bedside nursing report.  Initial Focused Assessment:  Upon my arrival, patient was in bed, alert, oriented x 4, diaphoretic, with a small approximate 0.5 inch laceration on bridge of nose.  BP is low: 80/63 right arm and 84/60 left arm. Patient reports that he went to the bathroom and began to feel dizzy so he sat on the bathroom floor. When he awoke, nursing staff had him back in bed. Patient expressed anxiety as evidenced by repeatedly asking if he was going to die. Patient actively having incontinent stools as result of current bowel prep for colonoscopy tomorrow, but no obvious blood seen. Blood sugar and ECG are unremarkable.  Interventions:  Rapid response standing orders: NS IVF bolus 500 ml, CBG STAT, CBC. Bedside staff contacted on call provider who came to bedside. STAT Head CT without contrast ordered. Changed level of care back to telemetry and bedside monitor set up.  Plan of Care:  Bedside RN will notify rapid RN and provider if any change in condition.   Event Summary:   MD Notified: Lavanda Horns, NP Call Time: 1919 Arrival Time: 1922 End Time: 2030  Alan LITTIE Portugal, RN

## 2024-08-13 ENCOUNTER — Inpatient Hospital Stay (HOSPITAL_COMMUNITY): Admitting: Anesthesiology

## 2024-08-13 ENCOUNTER — Encounter (HOSPITAL_COMMUNITY): Admission: EM | Disposition: A | Payer: Self-pay | Source: Home / Self Care

## 2024-08-13 ENCOUNTER — Ambulatory Visit: Admit: 2024-08-13 | Admitting: General Surgery

## 2024-08-13 ENCOUNTER — Encounter: Admitting: Gastroenterology

## 2024-08-13 ENCOUNTER — Encounter (HOSPITAL_COMMUNITY): Payer: Self-pay | Admitting: Internal Medicine

## 2024-08-13 DIAGNOSIS — E440 Moderate protein-calorie malnutrition: Secondary | ICD-10-CM | POA: Diagnosis present

## 2024-08-13 DIAGNOSIS — K2980 Duodenitis without bleeding: Secondary | ICD-10-CM | POA: Diagnosis not present

## 2024-08-13 DIAGNOSIS — Z1211 Encounter for screening for malignant neoplasm of colon: Secondary | ICD-10-CM | POA: Diagnosis not present

## 2024-08-13 DIAGNOSIS — Z9884 Bariatric surgery status: Secondary | ICD-10-CM | POA: Diagnosis not present

## 2024-08-13 DIAGNOSIS — D127 Benign neoplasm of rectosigmoid junction: Secondary | ICD-10-CM

## 2024-08-13 DIAGNOSIS — I10 Essential (primary) hypertension: Secondary | ICD-10-CM | POA: Diagnosis present

## 2024-08-13 DIAGNOSIS — K5521 Angiodysplasia of colon with hemorrhage: Secondary | ICD-10-CM | POA: Diagnosis present

## 2024-08-13 DIAGNOSIS — K449 Diaphragmatic hernia without obstruction or gangrene: Secondary | ICD-10-CM

## 2024-08-13 DIAGNOSIS — W19XXXA Unspecified fall, initial encounter: Secondary | ICD-10-CM | POA: Diagnosis present

## 2024-08-13 DIAGNOSIS — Z79899 Other long term (current) drug therapy: Secondary | ICD-10-CM | POA: Diagnosis not present

## 2024-08-13 DIAGNOSIS — Z7984 Long term (current) use of oral hypoglycemic drugs: Secondary | ICD-10-CM | POA: Diagnosis not present

## 2024-08-13 DIAGNOSIS — K2970 Gastritis, unspecified, without bleeding: Secondary | ICD-10-CM

## 2024-08-13 DIAGNOSIS — K3189 Other diseases of stomach and duodenum: Secondary | ICD-10-CM | POA: Diagnosis present

## 2024-08-13 DIAGNOSIS — E119 Type 2 diabetes mellitus without complications: Secondary | ICD-10-CM | POA: Diagnosis present

## 2024-08-13 DIAGNOSIS — D123 Benign neoplasm of transverse colon: Secondary | ICD-10-CM | POA: Diagnosis present

## 2024-08-13 DIAGNOSIS — Z860100 Personal history of colon polyps, unspecified: Secondary | ICD-10-CM

## 2024-08-13 DIAGNOSIS — Z886 Allergy status to analgesic agent status: Secondary | ICD-10-CM | POA: Diagnosis not present

## 2024-08-13 DIAGNOSIS — Z87891 Personal history of nicotine dependence: Secondary | ICD-10-CM | POA: Diagnosis not present

## 2024-08-13 DIAGNOSIS — K31819 Angiodysplasia of stomach and duodenum without bleeding: Secondary | ICD-10-CM | POA: Diagnosis not present

## 2024-08-13 DIAGNOSIS — K922 Gastrointestinal hemorrhage, unspecified: Secondary | ICD-10-CM | POA: Diagnosis not present

## 2024-08-13 DIAGNOSIS — Z8249 Family history of ischemic heart disease and other diseases of the circulatory system: Secondary | ICD-10-CM | POA: Diagnosis not present

## 2024-08-13 DIAGNOSIS — K921 Melena: Secondary | ICD-10-CM | POA: Diagnosis present

## 2024-08-13 DIAGNOSIS — D62 Acute posthemorrhagic anemia: Secondary | ICD-10-CM | POA: Diagnosis not present

## 2024-08-13 DIAGNOSIS — R194 Change in bowel habit: Secondary | ICD-10-CM | POA: Diagnosis not present

## 2024-08-13 DIAGNOSIS — D125 Benign neoplasm of sigmoid colon: Secondary | ICD-10-CM | POA: Diagnosis present

## 2024-08-13 DIAGNOSIS — K9509 Other complications of gastric band procedure: Secondary | ICD-10-CM | POA: Diagnosis present

## 2024-08-13 DIAGNOSIS — K2289 Other specified disease of esophagus: Secondary | ICD-10-CM | POA: Diagnosis present

## 2024-08-13 DIAGNOSIS — K31811 Angiodysplasia of stomach and duodenum with bleeding: Secondary | ICD-10-CM | POA: Diagnosis present

## 2024-08-13 DIAGNOSIS — Z833 Family history of diabetes mellitus: Secondary | ICD-10-CM | POA: Diagnosis not present

## 2024-08-13 DIAGNOSIS — Z8616 Personal history of COVID-19: Secondary | ICD-10-CM | POA: Diagnosis not present

## 2024-08-13 DIAGNOSIS — I2510 Atherosclerotic heart disease of native coronary artery without angina pectoris: Secondary | ICD-10-CM | POA: Diagnosis present

## 2024-08-13 DIAGNOSIS — D509 Iron deficiency anemia, unspecified: Secondary | ICD-10-CM | POA: Diagnosis not present

## 2024-08-13 DIAGNOSIS — D120 Benign neoplasm of cecum: Secondary | ICD-10-CM | POA: Diagnosis present

## 2024-08-13 DIAGNOSIS — Y831 Surgical operation with implant of artificial internal device as the cause of abnormal reaction of the patient, or of later complication, without mention of misadventure at the time of the procedure: Secondary | ICD-10-CM | POA: Diagnosis present

## 2024-08-13 DIAGNOSIS — K5520 Angiodysplasia of colon without hemorrhage: Secondary | ICD-10-CM

## 2024-08-13 DIAGNOSIS — K641 Second degree hemorrhoids: Secondary | ICD-10-CM | POA: Diagnosis present

## 2024-08-13 HISTORY — PX: ESOPHAGOGASTRODUODENOSCOPY: SHX5428

## 2024-08-13 HISTORY — PX: COLONOSCOPY: SHX5424

## 2024-08-13 LAB — MAGNESIUM: Magnesium: 2 mg/dL (ref 1.7–2.4)

## 2024-08-13 LAB — GLUCOSE, CAPILLARY
Glucose-Capillary: 112 mg/dL — ABNORMAL HIGH (ref 70–99)
Glucose-Capillary: 106 mg/dL — ABNORMAL HIGH (ref 70–99)
Glucose-Capillary: 223 mg/dL — ABNORMAL HIGH (ref 70–99)

## 2024-08-13 LAB — BASIC METABOLIC PANEL WITH GFR
Anion gap: 17 — ABNORMAL HIGH (ref 5–15)
BUN: 9 mg/dL (ref 6–20)
CO2: 21 mmol/L — ABNORMAL LOW (ref 22–32)
Calcium: 9.2 mg/dL (ref 8.9–10.3)
Chloride: 102 mmol/L (ref 98–111)
Creatinine, Ser: 1.07 mg/dL (ref 0.61–1.24)
GFR, Estimated: >60 mL/min (ref >60)
Glucose, Bld: 144 mg/dL — ABNORMAL HIGH (ref 70–99)
Potassium: 4.1 mmol/L (ref 3.5–5.1)
Sodium: 139 mmol/L (ref 135–145)

## 2024-08-13 LAB — CBC
HCT: 36.9 % — ABNORMAL LOW (ref 39.0–52.0)
Hemoglobin: 11.2 g/dL — ABNORMAL LOW (ref 13.0–17.0)
MCH: 24.9 pg — ABNORMAL LOW (ref 26.0–34.0)
MCHC: 30.4 g/dL (ref 30.0–36.0)
MCV: 82.2 fL (ref 80.0–100.0)
Platelets: 248 K/uL (ref 150–400)
RBC: 4.49 MIL/uL (ref 4.22–5.81)
RDW: 19.2 % — ABNORMAL HIGH (ref 11.5–15.5)
WBC: 7.3 K/uL (ref 4.0–10.5)
nRBC: 0 % (ref 0.0–0.2)

## 2024-08-13 LAB — CORTISOL: Cortisol, Plasma: 14.3 ug/dL

## 2024-08-13 LAB — TSH: TSH: 1.53 u[IU]/mL (ref 0.350–4.500)

## 2024-08-13 SURGERY — REMOVAL, GASTRIC BAND, LAPAROSCOPIC
Anesthesia: General

## 2024-08-13 SURGERY — EGD (ESOPHAGOGASTRODUODENOSCOPY)
Anesthesia: Monitor Anesthesia Care

## 2024-08-13 MED ORDER — MORPHINE SULFATE (PF) 2 MG/ML IV SOLN
2.0000 mg | INTRAVENOUS | Status: DC | PRN
  Administered 2024-08-13 – 2024-08-14 (×2): 2 mg via INTRAVENOUS
  Filled 2024-08-13 (×2): qty 1

## 2024-08-13 MED ORDER — LIDOCAINE 2% (20 MG/ML) 5 ML SYRINGE
INTRAMUSCULAR | Status: DC | PRN
  Administered 2024-08-13: 40 mg via INTRAVENOUS

## 2024-08-13 MED ORDER — PROPOFOL 10 MG/ML IV BOLUS
INTRAVENOUS | Status: DC | PRN
  Administered 2024-08-13: 20 mg via INTRAVENOUS
  Administered 2024-08-13 (×2): 30 mg via INTRAVENOUS
  Administered 2024-08-13: 20 mg via INTRAVENOUS

## 2024-08-13 MED ORDER — DEXMEDETOMIDINE HCL IN NACL 80 MCG/20ML IV SOLN
INTRAVENOUS | Status: DC | PRN
  Administered 2024-08-13: 8 ug via INTRAVENOUS

## 2024-08-13 MED ORDER — NA SULFATE-K SULFATE-MG SULF 17.5-3.13-1.6 GM/177ML PO SOLN
1.0000 | Freq: Once | ORAL | Status: AC
  Administered 2024-08-13: 354 mL via ORAL
  Filled 2024-08-13: qty 1

## 2024-08-13 MED ORDER — SODIUM CHLORIDE 0.9 % IV SOLN
INTRAVENOUS | Status: AC | PRN
  Administered 2024-08-13: 500 mL via INTRAMUSCULAR

## 2024-08-13 MED ORDER — KETOROLAC TROMETHAMINE 30 MG/ML IJ SOLN: 30.0000 mg | Freq: Three times a day (TID) | INTRAMUSCULAR | Status: DC | PRN

## 2024-08-13 MED ORDER — PROPOFOL 500 MG/50ML IV EMUL
INTRAVENOUS | Status: DC | PRN
  Administered 2024-08-13: 180 ug/kg/min via INTRAVENOUS

## 2024-08-13 MED ORDER — SODIUM CHLORIDE 0.9 % IV SOLN: INTRAVENOUS | Status: DC

## 2024-08-13 MED ORDER — PHENYLEPHRINE 80 MCG/ML (10ML) SYRINGE FOR IV PUSH (FOR BLOOD PRESSURE SUPPORT)
PREFILLED_SYRINGE | INTRAVENOUS | Status: DC | PRN
  Administered 2024-08-13 (×2): 80 ug via INTRAVENOUS

## 2024-08-13 NOTE — Progress Notes (Addendum)
 [facility] Progress Note  CC:  Anemia, dark stool  Subjective: Patient completed second dose of Suprep after 5 AM. RN reported he continues to pass brown watery liquid per the rectum.  Patient is currently sitting on the bedpan actively passing liquid per the rectum.  No hematochezia or melena.  He continues to have central lower abdominal pain which is chronic.  No severe abdominal pain at this time.  No chest pain or shortness of breath.  Patient stated his headache abated after he received IV iron yesterday.   Objective:  Vital signs in last 24 hours: Temp:  [97.9 F (36.6 C)-98.1 F (36.7 C)] 98.1 F (36.7 C) (12/04 0507) Pulse Rate:  [74-93] 93 (12/04 0507) Resp:  [13-20] 20 (12/04 0507) BP: (80-150)/(60-95) 133/85 (12/04 0507) SpO2:  [98 %-100 %] 99 % (12/04 0507) Last BM Date : 08/12/24 General: Alert 58 year old male in no acute distress.  Anxious appearing. Heart: Regular rate and rhythm, no murmurs. Pulm: Breath sounds clear throughout.  On room air. Abdomen: Soft, nondistended.  Nontender.  Lap-Band palpated LUQ area. positive bowel sounds to all 4 quadrants. Extremities: No lower extremity edema. Neurologic:  Alert and oriented x 4. Grossly normal neurologically. Psych:  Alert and cooperative. Normal mood and affect.  Intake/Output from previous day: 12/03 0701 - 12/04 0700 In: 200.3 [I.V.:200.3] Out: 1271 [Urine:1271] Intake/Output this shift: No intake/output data recorded.  Lab Results: Recent Labs    08/12/24 0343 08/12/24 2110 08/13/24 0401  WBC 4.8 10.5 7.3  HGB 10.8* 11.2* 11.2*  HCT 35.3* 35.9* 36.9*  PLT 282 327 248   BMET Recent Labs    08/11/24 1313 08/12/24 0343 08/13/24 0401  NA 135 140 139  K 4.1 4.4 4.1  CL 98 105 102  CO2 21* 24 21*  GLUCOSE 117* 86 144*  BUN 10 8 9   CREATININE 1.11 1.15 1.07  CALCIUM  9.7 9.3 9.2   LFT Recent Labs    08/11/24 1313  PROT 8.1  ALBUMIN 4.9  AST 28  ALT 28  ALKPHOS 92   BILITOT 0.5   PT/INR No results for input(s): LABPROT, INR in the last 72 hours. Hepatitis Panel No results for input(s): HEPBSAG, HCVAB, HEPAIGM, HEPBIGM in the last 72 hours.  CT HEAD WO CONTRAST ( ) Result Date: 08/12/2024 EXAM: CT HEAD WITHOUT CONTRAST 08/12/2024 08:32:00 PM TECHNIQUE: CT of the head was performed without the administration of intravenous contrast. Automated exposure control, iterative reconstruction, and/or weight based adjustment of the mA/kV was utilized to reduce the radiation dose to as low as reasonably achievable. COMPARISON: 08/11/2024 CLINICAL HISTORY: Syncope/presyncope, cerebrovascular cause suspected FINDINGS: BRAIN AND VENTRICLES: No acute hemorrhage. No evidence of acute infarct. No hydrocephalus. No extra-axial collection. No mass effect or midline shift. ORBITS: No acute abnormality. SINUSES: No acute abnormality. SOFT TISSUES AND SKULL: No acute soft tissue abnormality. No skull fracture. IMPRESSION: 1. No acute intracranial abnormality. Normal CT of the head for age Electronically signed by: Lonni Necessary MD 08/12/2024 09:14 PM EST RP Workstation: [id number]   ECHOCARDIOGRAM COMPLETE Result Date: 08/12/2024    ECHOCARDIOGRAM REPORT   Patient Name:   Kevin [name] Date of Exam: 08/12/2024 Medical Rec #:  [id number]   Height:       67.0 in Accession #:    [id number]  Weight:       185.2 lb Date of Birth:  05/08/1966   BSA:          1.957 m Patient Age:  58 years    BP:           135/84 mmHg Patient Gender: M           HR:           86 bpm. Exam Location:  Inpatient Procedure: 2D Echo, Cardiac Doppler and Color Doppler (Both Spectral and Color            Flow Doppler were utilized during procedure). Indications:    I42.9 Cardiomyopathy (unspecified); R55 Syncope  History:        Patient has prior history of Echocardiogram examinations, most                 recent 03/26/2018. Signs/Symptoms:Chest Pain; Risk                 Factors:Hypertension,  Diabetes and Former Smoker.  Sonographer:    Ellouise Mose RDCS Referring Phys: [id number] BURGESS JAYSON DARE  Sonographer Comments: Technically difficult study due to poor echo windows. IMPRESSIONS  1. Left ventricular ejection fraction, by estimation, is 60 to 65%. Left ventricular ejection fraction by 2D MOD biplane is 63.5 %. The left ventricle has normal function. The left ventricle has no regional wall motion abnormalities. Left ventricular diastolic parameters were normal.  2. Right ventricular systolic function is normal. The right ventricular size is normal.  3. The mitral valve is normal in structure. Trivial mitral valve regurgitation. No evidence of mitral stenosis.  4. The aortic valve is tricuspid. Aortic valve regurgitation is not visualized. No aortic stenosis is present.  5. Aortic dilatation noted. There is dilatation of the ascending aorta, measuring 40 mm.  6. The inferior vena cava is normal in size with <50% respiratory variability, suggesting right atrial pressure of 8 mmHg. Comparison(s): Changes from prior study are noted. FINDINGS  Left Ventricle: Left ventricular ejection fraction, by estimation, is 60 to 65%. Left ventricular ejection fraction by 2D MOD biplane is 63.5 %. The left ventricle has normal function. The left ventricle has no regional wall motion abnormalities. The left ventricular internal cavity size was normal in size. There is borderline left ventricular hypertrophy. Left ventricular diastolic parameters were normal. Right Ventricle: The right ventricular size is normal. No increase in right ventricular wall thickness. Right ventricular systolic function is normal. Left Atrium: Left atrial size was normal in size. Right Atrium: Right atrial size was normal in size. Pericardium: There is no evidence of pericardial effusion. Mitral Valve: The mitral valve is normal in structure. Trivial mitral valve regurgitation. No evidence of mitral valve stenosis. Tricuspid Valve: The tricuspid valve  is normal in structure. Tricuspid valve regurgitation is not demonstrated. No evidence of tricuspid stenosis. Aortic Valve: The aortic valve is tricuspid. Aortic valve regurgitation is not visualized. No aortic stenosis is present. Pulmonic Valve: The pulmonic valve was normal in structure. Pulmonic valve regurgitation is not visualized. No evidence of pulmonic stenosis. Aorta: Aortic dilatation noted and the aortic root is normal in size and structure. There is dilatation of the ascending aorta, measuring 40 mm. Venous: The inferior vena cava is normal in size with less than 50% respiratory variability, suggesting right atrial pressure of 8 mmHg. IAS/Shunts: No atrial level shunt detected by color flow Doppler.  LEFT VENTRICLE PLAX 2D                        Biplane EF (MOD) LVIDd:         4.70 cm  LV Biplane EF:   Left LVIDs:         3.30 cm                          ventricular LV PW:         1.10 cm                          ejection LV IVS:        1.10 cm                          fraction by LVOT diam:     2.30 cm                          2D MOD LV SV:         76                               biplane is LV SV Index:   39                               63.5 %. LVOT Area:     4.15 cm LV IVRT:       95 msec         Diastology                                LV e' medial:    9.14 cm/s                                LV E/e' medial:  8.7 LV Volumes (MOD)               LV e' lateral:   10.00 cm/s LV vol d, MOD    77.3 ml       LV E/e' lateral: 7.9 A2C: LV vol d, MOD    73.5 ml A4C: LV vol s, MOD    37.0 ml A2C: LV vol s, MOD    20.2 ml A4C: LV SV MOD A2C:   40.3 ml LV SV MOD A4C:   73.5 ml LV SV MOD BP:    50.0 ml RIGHT VENTRICLE             IVC RV S prime:     15.90 cm/s  IVC diam: 1.60 cm TAPSE (M-mode): 1.8 cm LEFT ATRIUM             Index        RIGHT ATRIUM           Index LA diam:        3.10 cm 1.58 cm/m   RA Area:     10.10 cm LA Vol (A2C):   27.0 ml 13.79 ml/m  RA Volume:   18.40 ml  9.40 ml/m LA Vol  (A4C):   24.3 ml 12.42 ml/m LA Biplane Vol: 25.6 ml 13.08 ml/m  AORTIC VALVE LVOT Vmax:   109.00 cm/s LVOT Vmean:  68.600 cm/s LVOT VTI:    0.182 m  AORTA Ao Root diam: 3.80 cm Ao Asc diam:  3.87 cm MITRAL VALVE MV  Area (PHT): 4.49 cm    SHUNTS MV Decel Time: 169 msec    Systemic VTI:  0.18 m MV E velocity: 79.20 cm/s  Systemic Diam: 2.30 cm MV A velocity: 94.70 cm/s MV E/A ratio:  0.84 Franck Azobou Tonleu Electronically signed by Joelle Cedars Tonleu Signature Date/Time: 08/12/2024/2:52:56 PM    Final    CT Head Wo Contrast Result Date: 08/11/2024 EXAM: CT HEAD WITHOUT CONTRAST 08/11/2024 02:55:24 PM TECHNIQUE: CT of the head was performed without the administration of intravenous contrast. Automated exposure control, iterative reconstruction, and/or weight based adjustment of the mA/kV was utilized to reduce the radiation dose to as low as reasonably achievable. COMPARISON: 05/03/2017 CLINICAL HISTORY: Confusion, unwitnessed fall earlier. FINDINGS: BRAIN AND VENTRICLES: No acute hemorrhage. No evidence of acute infarct. No hydrocephalus. No extra-axial collection. No mass effect or midline shift. ORBITS: No acute abnormality. SINUSES: No acute abnormality. SOFT TISSUES AND SKULL: No acute soft tissue abnormality. No skull fracture. IMPRESSION: 1. No acute intracranial abnormality. Electronically signed by: Franky Crease MD 08/11/2024 03:39 PM EST RP Workstation: [id number]   CT Angio Chest/Abd/Pel for Dissection W and/or W/WO Result Date: 08/11/2024 EXAM: CTA CHEST, ABDOMEN AND PELVIS WITHOUT AND WITH CONTRAST 08/11/2024 02:55:24 PM TECHNIQUE: CTA of the chest was performed without and with the administration of intravenous contrast. CTA of the abdomen and pelvis was performed without and with the administration of intravenous contrast. 100 mL iohexol  (OMNIPAQUE ) 350 MG/ML injection 100 mL IOHEXOL  350 MG/ML SOLN was administered. Multiplanar reformatted images are provided for review. MIP images are  provided for review. Automated exposure control, iterative reconstruction, and/or weight based adjustment of the mA/kV was utilized to reduce the radiation dose to as low as reasonably achievable. COMPARISON: CT abdomen and pelvis 07/30/2024. CT angio chest 07/05/2024. CLINICAL HISTORY: GI bleed. FINDINGS: VASCULATURE: AORTA: Scattered aortic atherosclerosis. No aneurysm or dissection. PULMONARY ARTERIES: No pulmonary embolism with the limits of this exam. GREAT VESSELS OF AORTIC ARCH: No acute finding. No dissection. No arterial occlusion or significant stenosis. CELIAC TRUNK: No acute finding. No occlusion or significant stenosis. SUPERIOR MESENTERIC ARTERY: No acute finding. No occlusion or significant stenosis. INFERIOR MESENTERIC ARTERY: No acute finding. No occlusion or significant stenosis. RENAL ARTERIES: No acute finding. No occlusion or significant stenosis. ILIAC ARTERIES: No acute finding. No occlusion or significant stenosis. CHEST: MEDIASTINUM: Extensive 3 vessel coronary artery disease. No mediastinal lymphadenopathy. The heart and pericardium demonstrate no acute abnormality. LUNGS AND PLEURA: Small scattered pulmonary nodules, index nodule in the posterior left lower lobe measures 6 mm on image 93 compared to 5 mm previously, not significantly changed. Index posterior right lower lobe nodule on image 87 measures 4 mm, stable. No new or significantly enlarging nodule. No focal consolidation or pulmonary edema. No evidence of pleural effusion or pneumothorax. THORACIC BONES AND SOFT TISSUES: No acute bone or soft tissue abnormality. ABDOMEN AND PELVIS: LIVER: The liver is unremarkable. GALLBLADDER AND BILE DUCTS: Gallbladder is unremarkable. No biliary ductal dilatation. SPLEEN: The spleen is unremarkable. PANCREAS: The pancreas is unremarkable. ADRENAL GLANDS: Bilateral adrenal glands demonstrate no acute abnormality. KIDNEYS, URETERS AND BLADDER: No stones in the kidneys or ureters. No  hydronephrosis. No perinephric or periureteral stranding. Urinary bladder is unremarkable. GI AND BOWEL: Lap band noted in place around the proximal stomach. This is unchanged. Stomach and duodenal sweep demonstrate no acute abnormality. There is no bowel obstruction. No abnormal bowel wall thickening or distension. REPRODUCTIVE: Reproductive organs are unremarkable. PERITONEUM AND RETROPERITONEUM: No ascites or free air. LYMPH  NODES: No lymphadenopathy. ABDOMINAL BONES AND SOFT TISSUES: No acute abnormality of the bones. No acute soft tissue abnormality. IMPRESSION: 1. No evidence of aortic aneurysm or dissection. 2. No evidence of pulmonary embolism. 3. Extensive coronary artery disease. 4. Small scattered pulmonary nodules, with the largest in the posterior left lower lobe measuring 6 mm and without significant interval change; management per Fleischner Society Guidelines is recommended with follow-up chest CT in 1 year. Electronically signed by: Franky Crease MD 08/11/2024 03:38 PM EST RP Workstation: [id number]    Assessment / Plan:  58 year old male admitted 08/11/2024 with syncope, melena and IDA. Hg 11.2. BUN 9. CTA showed prior lap band noted in place around the proximal stomach and no evidence of bowel wall thickening or obstruction. Received IV iron x 1. On oral iron.  Patient completed 2 doses of Suprep, last dose completed at 5 AM and he continues to pass watery brown stool at this time.  Hemodynamically stable. - NPO - IV fluids per the hospitalist  - Patient's RN will contact our GI service if patient does not completely clear out from 2nd dose of Suprep within the next 2 hours.  - Patient to proceed EGD and colonoscopy scheduled this afternoon with Dr. Wilhelmenia  - Continue Pantoprazole  40 mg daily - Ondansetron  4 mg p.o. or IV every 6 hours as needed  Past Lab band surgery, band deflated   Pulmonary nodules per CTA  Coronary artery disease. CTA showed extensive 3 vessel coronary  artery disease. LV EF 60 - 65  % per ECHO 12/3.  No angina. - Recommend outpatient cardiology evaluation  Principal Problem:   Upper GI bleed Active Problems:   Dark stools   Change in bowel habits   Chronic bilateral lower abdominal pain   Malnutrition of moderate degree     LOS: 0 days   Elida HERO Kennedy-Smith  08/13/2024, 9:30 AM

## 2024-08-13 NOTE — Plan of Care (Signed)
  Problem: Education: Goal: Ability to describe self-care measures that may prevent or decrease complications (Diabetes Survival Skills Education) will improve Outcome: Progressing Goal: Individualized Educational Video(s) Outcome: Progressing   Problem: Coping: Goal: Ability to adjust to condition or change in health will improve Outcome: Progressing   Problem: Fluid Volume: Goal: Ability to maintain a balanced intake and output will improve Outcome: Progressing   Problem: Health Behavior/Discharge Planning: Goal: Ability to identify and utilize available resources and services will improve Outcome: Progressing Goal: Ability to manage health-related needs will improve Outcome: Progressing   Problem: Metabolic: Goal: Ability to maintain appropriate glucose levels will improve Outcome: Progressing   Problem: Nutritional: Goal: Maintenance of adequate nutrition will improve Outcome: Progressing Goal: Progress toward achieving an optimal weight will improve Outcome: Progressing   Problem: Skin Integrity: Goal: Risk for impaired skin integrity will decrease Outcome: Progressing   Problem: Tissue Perfusion: Goal: Adequacy of tissue perfusion will improve Outcome: Progressing   Problem: Education: Goal: Knowledge of General Education information will improve Description: Including pain rating scale, medication(s)/side effects and non-pharmacologic comfort measures Outcome: Progressing   Problem: Health Behavior/Discharge Planning: Goal: Ability to manage health-related needs will improve Outcome: Progressing   Problem: Clinical Measurements: Goal: Ability to maintain clinical measurements within normal limits will improve Outcome: Progressing Goal: Will remain free from infection Outcome: Progressing Goal: Diagnostic test results will improve Outcome: Progressing Goal: Respiratory complications will improve Outcome: Progressing Goal: Cardiovascular complication will  be avoided Outcome: Progressing   Problem: Activity: Goal: Risk for activity intolerance will decrease Outcome: Progressing   Problem: Nutrition: Goal: Adequate nutrition will be maintained Outcome: Progressing   Problem: Coping: Goal: Level of anxiety will decrease Outcome: Progressing   Problem: Elimination: Goal: Will not experience complications related to bowel motility Outcome: Progressing Goal: Will not experience complications related to urinary retention Outcome: Progressing   Problem: Pain Managment: Goal: General experience of comfort will improve and/or be controlled Outcome: Progressing   Problem: Skin Integrity: Goal: Risk for impaired skin integrity will decrease Outcome: Progressing  Pt had an unwitnessed fall. Pt was taken to CT for a head scan.

## 2024-08-13 NOTE — Progress Notes (Signed)
 PROGRESS NOTE    Kevin [name]  FMW:[id number] DOB: 11-16-1965 DOA: 08/11/2024 PCP: Theotis Haze ORN, NP    Brief Narrative:   58 year old with history of melena, chronic abdominal pain, anxiety, non-insulin-dependent DM 2, HTN admitted to the hospital for progressive weight loss, weakness and syncope.  He has dealt with episodes of melena over the last decade secondary to abnormal polyps.  He follows outpatient [facility] will [facility] and was scheduled for colonoscopy on 12/4 but due to syncopal event he ended up coming to the hospital. Patient seen by GI with plans for endoscopic evaluation both EGD and colonoscopy.  Assessment & Plan:  Syncope with fall - Possibly in the setting of GI bleed.  Echocardiogram shows preserved EF.  EKG showing PACs -CT head negative, repeat CT head due to another fall in the hospital is also negative - Orthostatics, gentle fluids -Check TSH, random cortisol, PT/OT  GI bleed Acute on chronic blood loss anemia Iron deficiency with microcytosis - Planned EGD and colonoscopy  - Hemoglobin currently around baseline of 11.0 - Iron studies showing low saturation therefore IV iron given, outpatient referral  Laparoscopic gastric band -Initially placed in 2006, recently laparoscopic band fluid was removed with some relief in his symptoms.  Plan is to eventually remove the band.   Non-insulin-dependent type 2 diabetes-well-controlled, last A1c 6.3. -Sliding scale and Accu-Cheks   Hypertension -Holding ARB.  Continue Norvasc .  IV as needed  Progressive weight loss and weakness - Dietitian consulted.  CT chest abdomen pelvis does not show any occult malignancy but obviously this is not the best test therefore recommend outpatient follow-up with PCP to make sure he is up-to-date with all of his screenings.  Pulmonary nodule - Multiple nodules including one 6 mm.  Recommend outpatient follow-up and a repeat scan in 6 months to 1 year  DVT prophylaxis: SCDs Start:  08/11/24 1833    Code Status: Full Code Family Communication: Spouse at bedside Continue hospital stay   PT Follow up Recs:   Subjective: Reportedly patient had a fall last evening with small laceration on his nose.  CT head negative. During my evaluation no other complaints  Examination:  General exam: Appears calm and comfortable  Respiratory system: Clear to auscultation. Respiratory effort normal. Cardiovascular system: S1 & S2 heard, RRR. No JVD, murmurs, rubs, gallops or clicks. No pedal edema. Gastrointestinal system: Abdomen is nondistended, soft and nontender. No organomegaly or masses felt. Normal bowel sounds heard. Central nervous system: Alert and oriented. No focal neurological deficits. Extremities: Symmetric 5 x 5 power. Skin: No rashes, lesions or ulcers Psychiatry: Judgement and insight appear normal. Mood & affect appropriate.                Diet Orders (From admission, onward)     Start     Ordered   08/13/24 0001  Diet NPO time specified  Diet effective midnight        08/12/24 1028            Objective: Vitals:   08/12/24 1935 08/12/24 1936 08/12/24 2000 08/13/24 0507  BP: (!) 84/60 113/81 93/68 133/85  Pulse: 74 75 75 93  Resp:  17 13 20   Temp: 97.9 F (36.6 C) 97.9 F (36.6 C)  98.1 F (36.7 C)  TempSrc: Oral Oral  Oral  SpO2: 100% 99% 99% 99%  Weight:      Height:        Intake/Output Summary (Last 24 hours) at 08/13/2024 1100 Last data  filed at 08/13/2024 0830 Gross per 24 hour  Intake 440.26 ml  Output 1271 ml  Net -830.74 ml   Filed Weights   08/11/24 2009  Weight: 84 kg    Scheduled Meds:  amLODipine   10 mg Oral Daily   atorvastatin   40 mg Oral Daily   feeding supplement  1 Container Oral TID BM   ferrous sulfate  325 mg Oral Q breakfast   insulin aspart  0-15 Units Subcutaneous TID WC   insulin aspart  0-5 Units Subcutaneous QHS   multivitamin with minerals  1 tablet Oral Daily   pantoprazole   40 mg Oral  Daily   senna-docusate  2 tablet Oral QHS   Continuous Infusions:  sodium chloride       Nutritional status Signs/Symptoms: moderate fat depletion, moderate muscle depletion Interventions: Boost Breeze, MVI Body mass index is 29.01 kg/m.  Data Reviewed:   CBC: Recent Labs  Lab 08/11/24 1313 08/12/24 0343 08/12/24 2110 08/13/24 0401  WBC 6.7 4.8 10.5 7.3  NEUTROABS 3.9  --   --   --   HGB 12.5* 10.8* 11.2* 11.2*  HCT 38.7* 35.3* 35.9* 36.9*  MCV 78.5* 81.7 80.9 82.2  PLT 342 282 327 248   Basic Metabolic Panel: Recent Labs  Lab 08/11/24 1313 08/12/24 0343 08/13/24 0401  NA 135 140 139  K 4.1 4.4 4.1  CL 98 105 102  CO2 21* 24 21*  GLUCOSE 117* 86 144*  BUN 10 8 9   CREATININE 1.11 1.15 1.07  CALCIUM  9.7 9.3 9.2  MG  --   --  2.0   GFR: Estimated Creatinine Clearance: 78 mL/min (by C-G formula based on SCr of 1.07 mg/dL). Liver Function Tests: Recent Labs  Lab 08/11/24 1313  AST 28  ALT 28  ALKPHOS 92  BILITOT 0.5  PROT 8.1  ALBUMIN 4.9   No results for input(s): LIPASE, AMYLASE in the last 168 hours. Recent Labs  Lab 08/11/24 1514  AMMONIA 19   Coagulation Profile: No results for input(s): INR, PROTIME in the last 168 hours. Cardiac Enzymes: No results for input(s): CKTOTAL, CKMB, CKMBINDEX, TROPONINI in the last 168 hours. BNP (last 3 results) Recent Labs    07/05/24 0956  PROBNP 85.5   HbA1C: No results for input(s): HGBA1C in the last 72 hours. CBG: Recent Labs  Lab 08/12/24 1122 08/12/24 1652 08/12/24 1922 08/12/24 2140 08/13/24 0757  GLUCAP 74 214* 119* 108* 112*   Lipid Profile: No results for input(s): CHOL, HDL, LDLCALC, TRIG, CHOLHDL, LDLDIRECT in the last 72 hours. Thyroid  Function Tests: No results for input(s): TSH, T4TOTAL, FREET4, T3FREE, THYROIDAB in the last 72 hours. Anemia Panel: No results for input(s): VITAMINB12, FOLATE, FERRITIN, TIBC, IRON, RETICCTPCT in  the last 72 hours. Sepsis Labs: No results for input(s): PROCALCITON, LATICACIDVEN in the last 168 hours.  No results found for this or any previous visit (from the past 240 hours).       Radiology Studies: CT HEAD WO CONTRAST ( ) Result Date: 08/12/2024 EXAM: CT HEAD WITHOUT CONTRAST 08/12/2024 08:32:00 PM TECHNIQUE: CT of the head was performed without the administration of intravenous contrast. Automated exposure control, iterative reconstruction, and/or weight based adjustment of the mA/kV was utilized to reduce the radiation dose to as low as reasonably achievable. COMPARISON: 08/11/2024 CLINICAL HISTORY: Syncope/presyncope, cerebrovascular cause suspected FINDINGS: BRAIN AND VENTRICLES: No acute hemorrhage. No evidence of acute infarct. No hydrocephalus. No extra-axial collection. No mass effect or midline shift. ORBITS: No acute abnormality. SINUSES: No acute  abnormality. SOFT TISSUES AND SKULL: No acute soft tissue abnormality. No skull fracture. IMPRESSION: 1. No acute intracranial abnormality. Normal CT of the head for age Electronically signed by: Lonni Necessary MD 08/12/2024 09:14 PM EST RP Workstation: [id number]   ECHOCARDIOGRAM COMPLETE Result Date: 08/12/2024    ECHOCARDIOGRAM REPORT   Patient Name:   CLAUDIS GIOVANELLI Date of Exam: 08/12/2024 Medical Rec #:  [id number]   Height:       67.0 in Accession #:    [id number]  Weight:       185.2 lb Date of Birth:  25-Aug-1966   BSA:          1.957 m Patient Age:    58 years    BP:           135/84 mmHg Patient Gender: M           HR:           86 bpm. Exam Location:  Inpatient Procedure: 2D Echo, Cardiac Doppler and Color Doppler (Both Spectral and Color            Flow Doppler were utilized during procedure). Indications:    I42.9 Cardiomyopathy (unspecified); R55 Syncope  History:        Patient has prior history of Echocardiogram examinations, most                 recent 03/26/2018. Signs/Symptoms:Chest Pain; Risk                  Factors:Hypertension, Diabetes and Former Smoker.  Sonographer:    Ellouise Mose RDCS Referring Phys: [id number] BURGESS JAYSON DARE  Sonographer Comments: Technically difficult study due to poor echo windows. IMPRESSIONS  1. Left ventricular ejection fraction, by estimation, is 60 to 65%. Left ventricular ejection fraction by 2D MOD biplane is 63.5 %. The left ventricle has normal function. The left ventricle has no regional wall motion abnormalities. Left ventricular diastolic parameters were normal.  2. Right ventricular systolic function is normal. The right ventricular size is normal.  3. The mitral valve is normal in structure. Trivial mitral valve regurgitation. No evidence of mitral stenosis.  4. The aortic valve is tricuspid. Aortic valve regurgitation is not visualized. No aortic stenosis is present.  5. Aortic dilatation noted. There is dilatation of the ascending aorta, measuring 40 mm.  6. The inferior vena cava is normal in size with <50% respiratory variability, suggesting right atrial pressure of 8 mmHg. Comparison(s): Changes from prior study are noted. FINDINGS  Left Ventricle: Left ventricular ejection fraction, by estimation, is 60 to 65%. Left ventricular ejection fraction by 2D MOD biplane is 63.5 %. The left ventricle has normal function. The left ventricle has no regional wall motion abnormalities. The left ventricular internal cavity size was normal in size. There is borderline left ventricular hypertrophy. Left ventricular diastolic parameters were normal. Right Ventricle: The right ventricular size is normal. No increase in right ventricular wall thickness. Right ventricular systolic function is normal. Left Atrium: Left atrial size was normal in size. Right Atrium: Right atrial size was normal in size. Pericardium: There is no evidence of pericardial effusion. Mitral Valve: The mitral valve is normal in structure. Trivial mitral valve regurgitation. No evidence of mitral valve stenosis. Tricuspid  Valve: The tricuspid valve is normal in structure. Tricuspid valve regurgitation is not demonstrated. No evidence of tricuspid stenosis. Aortic Valve: The aortic valve is tricuspid. Aortic valve regurgitation is not visualized. No aortic stenosis is present. Pulmonic Valve: The pulmonic valve  was normal in structure. Pulmonic valve regurgitation is not visualized. No evidence of pulmonic stenosis. Aorta: Aortic dilatation noted and the aortic root is normal in size and structure. There is dilatation of the ascending aorta, measuring 40 mm. Venous: The inferior vena cava is normal in size with less than 50% respiratory variability, suggesting right atrial pressure of 8 mmHg. IAS/Shunts: No atrial level shunt detected by color flow Doppler.  LEFT VENTRICLE PLAX 2D                        Biplane EF (MOD) LVIDd:         4.70 cm         LV Biplane EF:   Left LVIDs:         3.30 cm                          ventricular LV PW:         1.10 cm                          ejection LV IVS:        1.10 cm                          fraction by LVOT diam:     2.30 cm                          2D MOD LV SV:         76                               biplane is LV SV Index:   39                               63.5 %. LVOT Area:     4.15 cm LV IVRT:       95 msec         Diastology                                LV e' medial:    9.14 cm/s                                LV E/e' medial:  8.7 LV Volumes (MOD)               LV e' lateral:   10.00 cm/s LV vol d, MOD    77.3 ml       LV E/e' lateral: 7.9 A2C: LV vol d, MOD    73.5 ml A4C: LV vol s, MOD    37.0 ml A2C: LV vol s, MOD    20.2 ml A4C: LV SV MOD A2C:   40.3 ml LV SV MOD A4C:   73.5 ml LV SV MOD BP:    50.0 ml RIGHT VENTRICLE             IVC RV S prime:     15.90 cm/s  IVC diam: 1.60 cm TAPSE (M-mode): 1.8 cm LEFT ATRIUM  Index        RIGHT ATRIUM           Index LA diam:        3.10 cm 1.58 cm/m   RA Area:     10.10 cm LA Vol (A2C):   27.0 ml 13.79 ml/m  RA Volume:    18.40 ml  9.40 ml/m LA Vol (A4C):   24.3 ml 12.42 ml/m LA Biplane Vol: 25.6 ml 13.08 ml/m  AORTIC VALVE LVOT Vmax:   109.00 cm/s LVOT Vmean:  68.600 cm/s LVOT VTI:    0.182 m  AORTA Ao Root diam: 3.80 cm Ao Asc diam:  3.87 cm MITRAL VALVE MV Area (PHT): 4.49 cm    SHUNTS MV Decel Time: 169 msec    Systemic VTI:  0.18 m MV E velocity: 79.20 cm/s  Systemic Diam: 2.30 cm MV A velocity: 94.70 cm/s MV E/A ratio:  0.84 Franck Azobou Tonleu Electronically signed by Joelle Cedars Tonleu Signature Date/Time: 08/12/2024/2:52:56 PM    Final    CT Head Wo Contrast Result Date: 08/11/2024 EXAM: CT HEAD WITHOUT CONTRAST 08/11/2024 02:55:24 PM TECHNIQUE: CT of the head was performed without the administration of intravenous contrast. Automated exposure control, iterative reconstruction, and/or weight based adjustment of the mA/kV was utilized to reduce the radiation dose to as low as reasonably achievable. COMPARISON: 05/03/2017 CLINICAL HISTORY: Confusion, unwitnessed fall earlier. FINDINGS: BRAIN AND VENTRICLES: No acute hemorrhage. No evidence of acute infarct. No hydrocephalus. No extra-axial collection. No mass effect or midline shift. ORBITS: No acute abnormality. SINUSES: No acute abnormality. SOFT TISSUES AND SKULL: No acute soft tissue abnormality. No skull fracture. IMPRESSION: 1. No acute intracranial abnormality. Electronically signed by: Franky Crease MD 08/11/2024 03:39 PM EST RP Workstation: [id number]   CT Angio Chest/Abd/Pel for Dissection W and/or W/WO Result Date: 08/11/2024 EXAM: CTA CHEST, ABDOMEN AND PELVIS WITHOUT AND WITH CONTRAST 08/11/2024 02:55:24 PM TECHNIQUE: CTA of the chest was performed without and with the administration of intravenous contrast. CTA of the abdomen and pelvis was performed without and with the administration of intravenous contrast. 100 mL iohexol  (OMNIPAQUE ) 350 MG/ML injection 100 mL IOHEXOL  350 MG/ML SOLN was administered. Multiplanar reformatted images are provided for  review. MIP images are provided for review. Automated exposure control, iterative reconstruction, and/or weight based adjustment of the mA/kV was utilized to reduce the radiation dose to as low as reasonably achievable. COMPARISON: CT abdomen and pelvis 07/30/2024. CT angio chest 07/05/2024. CLINICAL HISTORY: GI bleed. FINDINGS: VASCULATURE: AORTA: Scattered aortic atherosclerosis. No aneurysm or dissection. PULMONARY ARTERIES: No pulmonary embolism with the limits of this exam. GREAT VESSELS OF AORTIC ARCH: No acute finding. No dissection. No arterial occlusion or significant stenosis. CELIAC TRUNK: No acute finding. No occlusion or significant stenosis. SUPERIOR MESENTERIC ARTERY: No acute finding. No occlusion or significant stenosis. INFERIOR MESENTERIC ARTERY: No acute finding. No occlusion or significant stenosis. RENAL ARTERIES: No acute finding. No occlusion or significant stenosis. ILIAC ARTERIES: No acute finding. No occlusion or significant stenosis. CHEST: MEDIASTINUM: Extensive 3 vessel coronary artery disease. No mediastinal lymphadenopathy. The heart and pericardium demonstrate no acute abnormality. LUNGS AND PLEURA: Small scattered pulmonary nodules, index nodule in the posterior left lower lobe measures 6 mm on image 93 compared to 5 mm previously, not significantly changed. Index posterior right lower lobe nodule on image 87 measures 4 mm, stable. No new or significantly enlarging nodule. No focal consolidation or pulmonary edema. No evidence of pleural effusion or pneumothorax. THORACIC BONES AND SOFT TISSUES:  No acute bone or soft tissue abnormality. ABDOMEN AND PELVIS: LIVER: The liver is unremarkable. GALLBLADDER AND BILE DUCTS: Gallbladder is unremarkable. No biliary ductal dilatation. SPLEEN: The spleen is unremarkable. PANCREAS: The pancreas is unremarkable. ADRENAL GLANDS: Bilateral adrenal glands demonstrate no acute abnormality. KIDNEYS, URETERS AND BLADDER: No stones in the kidneys or  ureters. No hydronephrosis. No perinephric or periureteral stranding. Urinary bladder is unremarkable. GI AND BOWEL: Lap band noted in place around the proximal stomach. This is unchanged. Stomach and duodenal sweep demonstrate no acute abnormality. There is no bowel obstruction. No abnormal bowel wall thickening or distension. REPRODUCTIVE: Reproductive organs are unremarkable. PERITONEUM AND RETROPERITONEUM: No ascites or free air. LYMPH NODES: No lymphadenopathy. ABDOMINAL BONES AND SOFT TISSUES: No acute abnormality of the bones. No acute soft tissue abnormality. IMPRESSION: 1. No evidence of aortic aneurysm or dissection. 2. No evidence of pulmonary embolism. 3. Extensive coronary artery disease. 4. Small scattered pulmonary nodules, with the largest in the posterior left lower lobe measuring 6 mm and without significant interval change; management per Fleischner Society Guidelines is recommended with follow-up chest CT in 1 year. Electronically signed by: Franky Crease MD 08/11/2024 03:38 PM EST RP Workstation: [id number]           LOS: 0 days   Time spent= 35 mins    Burgess JAYSON Dare, MD [facility]  If 7PM-7AM, please contact night-coverage  08/13/2024, 11:00 AM

## 2024-08-13 NOTE — TOC Progression Note (Signed)
 Transition of Care [facility]) - Progression Note   Patient Details  Name: Kevin [name] MRN: [id number] Date of Birth: 02-06-1966  [facility]) CM/SW Contact  Duwaine GORMAN Aran, LCSW Phone Number: 08/13/2024, 9:17 AM  Clinical Narrative: PT/OT consulted. Care management awaiting recommendations.  Barriers to Discharge: Continued Medical Work up  Social Drivers of Health (SDOH) Interventions SDOH Screenings   Food Insecurity: No Food Insecurity (08/11/2024)  Housing: Low Risk  (08/11/2024)  Transportation Needs: No Transportation Needs (08/11/2024)  Utilities: Not At Risk (08/11/2024)  Depression (PHQ2-9): High Risk (07/28/2024)  Financial Resource Strain: Low Risk  (07/22/2023)  Physical Activity: Inactive (07/22/2023)  Social Connections: Socially Isolated (07/22/2023)  Stress: No Stress Concern Present (07/22/2023)  Tobacco Use: Medium Risk (08/11/2024)  Health Literacy: Adequate Health Literacy (07/22/2023)   Readmission Risk Interventions     No data to display

## 2024-08-13 NOTE — Op Note (Addendum)
 [facility] Patient Name: Kevin [name] Procedure Date: 08/13/2024 MRN: [id number] Attending MD: Aloha Finner , MD, [id number] Date of Birth: [birth year] CSN: [id number] Age: 58 Admit Type: Inpatient Procedure:                Colonoscopy Indications:              Screening for colorectal malignant neoplasm,                            Incidental - Lower abdominal pain, Incidental -                            Iron deficiency anemia Providers:                Aloha Finner, MD, Robie Breed, RN,                            Corene Southgate, Technician Referring MD:              Medicines:                Monitored Anesthesia Care Complications:            No immediate complications. Estimated Blood Loss:     Estimated blood loss was minimal. Procedure:                Pre-Anesthesia Assessment:                           - Prior to the procedure, a History and Physical                            was performed, and patient medications and                            allergies were reviewed. The patient's tolerance of                            previous anesthesia was also reviewed. The risks                            and benefits of the procedure and the sedation                            options and risks were discussed with the patient.                            All questions were answered, and informed consent                            was obtained. Prior Anticoagulants: The patient has                            taken no anticoagulant or antiplatelet agents. ASA                            Grade Assessment: II -  A patient with mild systemic                            disease. After reviewing the risks and benefits,                            the patient was deemed in satisfactory condition to                            undergo the procedure.                           After obtaining informed consent, the colonoscope                            was passed under  direct vision. Throughout the                            procedure, the patient's blood pressure, pulse, and                            oxygen saturations were monitored continuously. The                            CF-HQ190L ([id number] Olympus colonoscope was                            introduced through the anus and advanced to the the                            cecum, identified by appendiceal orifice and                            ileocecal valve. The colonoscopy was performed                            without difficulty. The patient tolerated the                            procedure. The quality of the bowel preparation was                            good. The terminal ileum, ileocecal valve,                            appendiceal orifice, and rectum were photographed. Scope In: 3:49:38 PM Scope Out: 4:07:33 PM Scope Withdrawal Time: 0 hours 14 minutes 38 seconds  Total Procedure Duration: 0 hours 17 minutes 55 seconds  Findings:      The digital rectal exam findings include hemorrhoids. Pertinent       negatives include no palpable rectal lesions.      The terminal ileum and ileocecal valve appeared normal.      Five sessile polyps were found in the recto-sigmoid colon (1),       transverse colon (1), hepatic flexure (2)  and cecum (1). The polyps were       2 to 6 mm in size. These polyps were removed with a cold snare.       Resection and retrieval were complete.      Normal mucosa was found in the entire colon otherwise. Biopsies for       histology were taken with a cold forceps from the entire colon for       evaluation of microscopic colitis.      Non-bleeding non-thrombosed internal hemorrhoids were found during       retroflexion, during perianal exam and during digital exam. The       hemorrhoids were Grade II (internal hemorrhoids that prolapse but reduce       spontaneously). Impression:               - Hemorrhoids found on digital rectal exam.                            - The examined portion of the ileum was normal.                           - Five 2 to 6 mm polyps at the recto-sigmoid colon,                            in the transverse colon, at the hepatic flexure and                            in the cecum, removed with a cold snare. Resected                            and retrieved.                           - Normal mucosa in the entire examined colon                            otherwise. Biopsied.                           - Non-bleeding non-thrombosed internal hemorrhoids. Moderate Sedation:      Not Applicable - Patient had care per Anesthesia. Recommendation:           - The patient will be observed post-procedure,                            until all discharge criteria are met.                           - Return patient to hospital ward for ongoing care.                           - High fiber diet.                           - Use FiberCon 1-2 tablets PO daily.                           -  Await pathology results.                           - Repeat colonoscopy in 3/5/7 years for                            surveillance.                           - Consider IV Iron as inpatient or expedited as                            outpatient.                           - PO Iron may restart in 1 week post discharge.                           - Outpatient evaluation with [facility] makes sense to                            complete the IDA evaluation. 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 However, he                            has yellow/brown stool at this time. No further                            inpatient GI workup is recommended at this time.                           - Etiology of chronic lower abdominal pain is not                            clear, but it is unlikely to cessate prior to                            discharge and will  require additional                            workup/consideration as oupatient.                           - The findings and recommendations were discussed                            with the patient.                           -  The findings and recommendations were discussed                            with the referring physician. Procedure Code(s):        --- Professional ---                           [phone], Colonoscopy, flexible; with removal of                            tumor(s), polyp(s), or other lesion(s) by snare                            technique Diagnosis Code(s):        --- Professional ---                           Z12.11, Encounter for screening for malignant                            neoplasm of colon                           K64.1, Second degree hemorrhoids                           D12.7, Benign neoplasm of rectosigmoid junction                           D12.3, Benign neoplasm of transverse colon (hepatic                            flexure or splenic flexure)                           D12.0, Benign neoplasm of cecum CPT copyright 2022 American [employer]. All rights reserved. The codes documented in this report are preliminary and upon coder review may  be revised to meet current compliance requirements. Aloha Finner, MD 08/13/2024 4:35:53 PM Number of Addenda: 0

## 2024-08-13 NOTE — Interval H&P Note (Signed)
 History and Physical Interval Note:  08/13/2024 3:03 PM  Kevin [name]  has presented today for surgery, with the diagnosis of IDA.  The various methods of treatment have been discussed with the patient and family. After consideration of risks, benefits and other options for treatment, the patient has consented to  Procedure(s): EGD (ESOPHAGOGASTRODUODENOSCOPY) (N/A) COLONOSCOPY (N/A) as a surgical intervention.  The patient's history has been reviewed, patient examined, no change in status, stable for surgery.  I have reviewed the patient's chart and labs.  Questions were answered to the patient's satisfaction.     [name]

## 2024-08-13 NOTE — Progress Notes (Signed)
 Chaplain followed up with pt Kevin [name] in order to complete advance directive documents. Upon arrival, he informed me they weren't filled out or ready yet, but that he still planned to eventually.  He went on to share with me the events of last night and how they affected his plans for care going forward. I helped him to facilitate making an important decision about whether or not he should bring one of his older brothers into the picture, both for his own support and that of his significant other, Kevin [name].  Kevin [name] expressed gratitude for chaplain presence, stating that he felt he got what he needed, and that the care/comfort he received was as significant as that from any of the nurses, techs and doctors. He also shared that he is Jewish (the chart indicates Non-Denominational). With his consent, we ended the visit with prayer.  Chaplains continue to remain available as needs arise.

## 2024-08-13 NOTE — Op Note (Signed)
 [facility] Patient Name: Kevin [name] Procedure Date: 08/13/2024 MRN: [id number] Attending MD: Aloha Finner , MD, [id number] Date of Birth: 03/09/1966 CSN: [id number] Age: 58 Admit Type: Inpatient Procedure:                Upper GI endoscopy Indications:              Lower abdominal pain, Iron deficiency anemia,                            History of previous LapBand recently released Providers:                Aloha Finner, MD, Robie Breed, RN,                            Corene Southgate, Technician Referring MD:              Medicines:                Monitored Anesthesia Care Complications:            No immediate complications. Estimated Blood Loss:     Estimated blood loss was minimal. Procedure:                Pre-Anesthesia Assessment:                           - Prior to the procedure, a History and Physical                            was performed, and patient medications and                            allergies were reviewed. The patient's tolerance of                            previous anesthesia was also reviewed. The risks                            and benefits of the procedure and the sedation                            options and risks were discussed with the patient.                            All questions were answered, and informed consent                            was obtained. Prior Anticoagulants: The patient has                            taken no anticoagulant or antiplatelet agents. ASA                            Grade Assessment: II - A patient with mild systemic  disease. After reviewing the risks and benefits,                            the patient was deemed in satisfactory condition to                            undergo the procedure.                           After obtaining informed consent, the endoscope was                            passed under direct vision. Throughout the                             procedure, the patient's blood pressure, pulse, and                            oxygen saturations were monitored continuously. The                            [id number]-[id number] ([id number]) Olympus endoscope was introduced                            through the mouth, and advanced to the third part                            of duodenum. The upper GI endoscopy was                            accomplished without difficulty. The patient                            tolerated the procedure. Scope In: Scope Out: Findings:      No gross lesions were noted in the entire esophagus.      The Z-line was irregular and was found 36 cm from the incisors.      A 1 cm hiatal hernia was present.      Evidence of a lap band pouch was found in the cardia (approximately 4 cm       in length).      Striped mildly erythematous mucosa without bleeding was found in the       gastric antrum.      No other gross lesions were noted in the entire examined stomach.       Biopsies were taken with a cold forceps for histology and Helicobacter       pylori testing.      A single small angioectasia with typical arborization was found in the       duodenal bulb. Fulguration to ablate the lesion to prevent bleeding by       argon plasma was successful.      No other gross lesions were noted in the duodenal bulb, in the first       portion of the duodenum, in the second portion of the duodenum and in       the third portion of  the duodenum. Biopsies for histology were taken       with a cold forceps for evaluation of celiac disease.      The major papilla was normal. Impression:               - No gross lesions in the entire esophagus. Z-line                            irregular, 36 cm from the incisors.                           - 1 cm hiatal hernia.                           - Gastric pouch from lap band noted (4 cm in                            length).                           - Erythematous mucosa in the antrum. No  other gross                            lesions in the entire stomach. Biopsied.                           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                            Treated with argon plasma coagulation (APC).                           - No other gross lesions in the duodenal bulb, in                            the first portion of the duodenum, in the second                            portion of the duodenum and in the third portion of                            the duodenum. Biopsied.                           - Normal major papilla. Moderate Sedation:      Not Applicable - Patient had care per Anesthesia. Recommendation:           - Proceed to scheduled colonoscopy.                           - Start PPI 40 mg once daily.                           - Continue present medications.                           -  Observe patient's clinical course.                           - Await pathology results.                           - The findings and recommendations were discussed                            with the patient.                           - The findings and recommendations were discussed                            with the referring physician. Procedure Code(s):        --- Professional ---                           [phone], 59, Esophagogastroduodenoscopy, flexible,                            transoral; with control of bleeding, any method                           43239, Esophagogastroduodenoscopy, flexible,                            transoral; with biopsy, single or multiple Diagnosis Code(s):        --- Professional ---                           K22.89, Other specified disease of esophagus                           K44.9, Diaphragmatic hernia without obstruction or                            gangrene                           Z98.84, Bariatric surgery status                           K31.89, Other diseases of stomach and duodenum                           K31.819,  Angiodysplasia of stomach and duodenum                            without bleeding                           R10.30, Lower abdominal pain, unspecified                           D50.9, Iron deficiency anemia, unspecified CPT copyright 2022 American Medical Association. All rights reserved. The  codes documented in this report are preliminary and upon coder review may  be revised to meet current compliance requirements. Aloha Finner, MD 08/13/2024 4:18:41 PM Number of Addenda: 0

## 2024-08-13 NOTE — Transfer of Care (Signed)
 Immediate Anesthesia Transfer of Care Note  Patient: Kevin [name]  Procedure(s) Performed: EGD (ESOPHAGOGASTRODUODENOSCOPY) COLONOSCOPY  Patient Location: Endoscopy Unit  Anesthesia Type:MAC  Level of Consciousness: sedated  Airway & Oxygen Therapy: Patient Spontanous Breathing and Patient connected to face mask oxygen  Post-op Assessment: Report given to RN and Post -op Vital signs reviewed and stable  Post vital signs: Reviewed and stable  Last Vitals:  Vitals Value Taken Time  BP 87/54 08/13/24 16:15  Temp    Pulse 79 08/13/24 16:17  Resp 12 08/13/24 16:17  SpO2 99 % 08/13/24 16:17  Vitals shown include unfiled device data.  Last Pain:  Vitals:   08/13/24 1436  TempSrc: Temporal  PainSc: 5       Patients Stated Pain Goal: 0 (08/13/24 0730)  Complications: No notable events documented.

## 2024-08-13 NOTE — Progress Notes (Signed)
   08/13/24 0121  What Happened  Was fall witnessed? No  Was patient injured? Yes  Patient found on floor  Found by Staff-comment (Kenn Rekowski RN and Lorenza RN)  Stated prior activity other (comment) (bathroom assisted)  Provider Notification  Provider Name/Title Andrez NP  Date Provider Notified 08/12/24  Time Provider Notified 1922  Method of Notification Call  Notification Reason Fall  Provider response See new orders  Date of Provider Response 08/12/24  Time of Provider Response 1924  Follow Up  Family notified Yes - comment  Time family notified 2124  Additional tests Yes-comment (CT scan)  Adult Fall Risk Assessment  Risk Factor Category (scoring not indicated) High fall risk per protocol (document High fall risk)  Patient Fall Risk Level High fall risk  Adult Fall Risk Interventions  Required Bundle Interventions *See Row Information* High fall risk  Additional Interventions Use of appropriate toileting equipment (bedpan, BSC, etc.)  Fall intervention(s) refused/Patient educated regarding refusal Bed alarm;Yellow bracelet;Supervision while toileting/edge of bed sitting  Screening for Fall Injury Risk (To be completed on HIGH fall risk patients) - Assessing Need for Floor Mats  Risk For Fall Injury- Criteria for Floor Mats Admitted as a result of a fall  Pain Assessment  Pain Scale 0-10  Pain Score 0  PCA/Epidural/Spinal Assessment  Respiratory Pattern Regular;Symmetrical;Unlabored  Neurological  Neuro (WDL) WDL  Level of Consciousness Alert  Glasgow Coma Scale  Eye Opening 4  Best Verbal Response (NON-intubated) 5  Best Motor Response 6  Glasgow Coma Scale Score 15  Musculoskeletal  Musculoskeletal (WDL) X  Assistive Device None  Generalized Weakness Yes  Integumentary  Integumentary (WDL) WDL   At 1920pm Tasha RN and I go into the room to do bedside report. We walk in the room and the patient is face down on the floor between the bathroom and room. I walk up to  him to check to see if he was conscience. I tapped him and called his first name. During this time Tasha RN when to get help. Chyrl RN and Sophia RN came in the room to help. We took vitals during the time he was laying on the floor but they were not validated. The rapid response nurse Lorn RN was contacted and she came to the room.  During that time A. Andrez NP was contacted and she came to the room. During this time the charge nurse Estela RN was present and helping with the patient. Once we got him back into the bed, he was able to tell us  his name, date of birth, where he was, and last known well. An EKG, blood sugar, CT head was ordered. He was also placed back on telemetry. Pt. Stated he was on the toilet and he pulled the string for the call bell in the bathroom and that was the last thing he remembered. Pt as a small scratch on the bridge of nose.

## 2024-08-13 NOTE — Anesthesia Postprocedure Evaluation (Signed)
 Anesthesia Post Note  Patient: Kevin [name]  Procedure(s) Performed: EGD (ESOPHAGOGASTRODUODENOSCOPY) COLONOSCOPY     Patient location during evaluation: Endoscopy Anesthesia Type: MAC Level of consciousness: awake and alert, oriented and patient cooperative Pain management: pain level controlled Vital Signs Assessment: post-procedure vital signs reviewed and stable Respiratory status: spontaneous breathing, nonlabored ventilation and respiratory function stable Cardiovascular status: blood pressure returned to baseline and stable Postop Assessment: no apparent nausea or vomiting Anesthetic complications: no   No notable events documented.  Last Vitals:  Vitals:   08/13/24 1645 08/13/24 1709  BP:  (!) 144/94  Pulse: 83 93  Resp: 14 16  Temp:  37.1 C  SpO2: 97% 100%    Last Pain:  Vitals:   08/13/24 1709  TempSrc: Oral  PainSc:                  Corneilus Heggie,E. Altha Sweitzer

## 2024-08-13 NOTE — Progress Notes (Signed)
 Ms. Kevin [name] called back at 10:21 pm and we spoke about what happened to Mr. Kevin [name]. While I was checking on the patient in the room, he called his brother that lives in [place]  and gave me permission to tell him about his syncope episode.

## 2024-08-13 NOTE — Anesthesia Preprocedure Evaluation (Signed)
 Anesthesia Evaluation  Patient identified by MRN, date of birth, ID band Patient awake    Reviewed: Allergy & Precautions, NPO status , Patient's Chart, lab work & pertinent test results  Airway Mallampati: II  TM Distance: >3 FB Neck ROM: Full    Dental  (+) Dental Advisory Given   Pulmonary former smoker   breath sounds clear to auscultation       Cardiovascular hypertension, Pt. on medications + Peripheral Vascular Disease   Rhythm:Regular Rate:Normal     Neuro/Psych negative neurological ROS     GI/Hepatic Neg liver ROS,GERD  ,,S/p gastric lap band   Endo/Other  diabetes, Type 2    Renal/GU negative Renal ROS     Musculoskeletal  (+) Arthritis ,    Abdominal   Peds  Hematology  (+) Blood dyscrasia, anemia   Anesthesia Other Findings   Reproductive/Obstetrics                              Anesthesia Physical Anesthesia Plan  ASA: 2  Anesthesia Plan: MAC   Post-op Pain Management:    Induction:   PONV Risk Score and Plan: 1 and Propofol  infusion  Airway Management Planned: Natural Airway and Nasal Cannula  Additional Equipment:   Intra-op Plan:   Post-operative Plan:   Informed Consent: I have reviewed the patients History and Physical, chart, labs and discussed the procedure including the risks, benefits and alternatives for the proposed anesthesia with the patient or authorized representative who has indicated his/her understanding and acceptance.       Plan Discussed with: CRNA  Anesthesia Plan Comments:         Anesthesia Quick Evaluation

## 2024-08-13 NOTE — H&P (View-Only) (Signed)
 [facility] Progress Note  CC:  Anemia, dark stool  Subjective: Patient completed second dose of Suprep after 5 AM. RN reported he continues to pass brown watery liquid per the rectum.  Patient is currently sitting on the bedpan actively passing liquid per the rectum.  No hematochezia or melena.  He continues to have central lower abdominal pain which is chronic.  No severe abdominal pain at this time.  No chest pain or shortness of breath.  Patient stated his headache abated after he received IV iron yesterday.   Objective:  Vital signs in last 24 hours: Temp:  [97.9 F (36.6 C)-98.1 F (36.7 C)] 98.1 F (36.7 C) (12/04 0507) Pulse Rate:  [74-93] 93 (12/04 0507) Resp:  [13-20] 20 (12/04 0507) BP: (80-150)/(60-95) 133/85 (12/04 0507) SpO2:  [98 %-100 %] 99 % (12/04 0507) Last BM Date : 08/12/24 General: Alert 58 year old male in no acute distress.  Anxious appearing. Heart: Regular rate and rhythm, no murmurs. Pulm: Breath sounds clear throughout.  On room air. Abdomen: Soft, nondistended.  Nontender.  Lap-Band palpated LUQ area. positive bowel sounds to all 4 quadrants. Extremities: No lower extremity edema. Neurologic:  Alert and oriented x 4. Grossly normal neurologically. Psych:  Alert and cooperative. Normal mood and affect.  Intake/Output from previous day: 12/03 0701 - 12/04 0700 In: 200.3 [I.V.:200.3] Out: 1271 [Urine:1271] Intake/Output this shift: No intake/output data recorded.  Lab Results: Recent Labs    08/12/24 0343 08/12/24 2110 08/13/24 0401  WBC 4.8 10.5 7.3  HGB 10.8* 11.2* 11.2*  HCT 35.3* 35.9* 36.9*  PLT 282 327 248   BMET Recent Labs    08/11/24 1313 08/12/24 0343 08/13/24 0401  NA 135 140 139  K 4.1 4.4 4.1  CL 98 105 102  CO2 21* 24 21*  GLUCOSE 117* 86 144*  BUN 10 8 9   CREATININE 1.11 1.15 1.07  CALCIUM  9.7 9.3 9.2   LFT Recent Labs    08/11/24 1313  PROT 8.1  ALBUMIN 4.9  AST 28  ALT 28  ALKPHOS 92   BILITOT 0.5   PT/INR No results for input(s): LABPROT, INR in the last 72 hours. Hepatitis Panel No results for input(s): HEPBSAG, HCVAB, HEPAIGM, HEPBIGM in the last 72 hours.  CT HEAD WO CONTRAST ( ) Result Date: 08/12/2024 EXAM: CT HEAD WITHOUT CONTRAST 08/12/2024 08:32:00 PM TECHNIQUE: CT of the head was performed without the administration of intravenous contrast. Automated exposure control, iterative reconstruction, and/or weight based adjustment of the mA/kV was utilized to reduce the radiation dose to as low as reasonably achievable. COMPARISON: 08/11/2024 CLINICAL HISTORY: Syncope/presyncope, cerebrovascular cause suspected FINDINGS: BRAIN AND VENTRICLES: No acute hemorrhage. No evidence of acute infarct. No hydrocephalus. No extra-axial collection. No mass effect or midline shift. ORBITS: No acute abnormality. SINUSES: No acute abnormality. SOFT TISSUES AND SKULL: No acute soft tissue abnormality. No skull fracture. IMPRESSION: 1. No acute intracranial abnormality. Normal CT of the head for age Electronically signed by: Lonni Necessary MD 08/12/2024 09:14 PM EST RP Workstation: [id number]   ECHOCARDIOGRAM COMPLETE Result Date: 08/12/2024    ECHOCARDIOGRAM REPORT   Patient Name:   Kevin [name] Date of Exam: 08/12/2024 Medical Rec #:  [id number]   Height:       67.0 in Accession #:    [id number]  Weight:       185.2 lb Date of Birth:  05/08/1966   BSA:          1.957 m Patient Age:  58 years    BP:           135/84 mmHg Patient Gender: M           HR:           86 bpm. Exam Location:  Inpatient Procedure: 2D Echo, Cardiac Doppler and Color Doppler (Both Spectral and Color            Flow Doppler were utilized during procedure). Indications:    I42.9 Cardiomyopathy (unspecified); R55 Syncope  History:        Patient has prior history of Echocardiogram examinations, most                 recent 03/26/2018. Signs/Symptoms:Chest Pain; Risk                 Factors:Hypertension,  Diabetes and Former Smoker.  Sonographer:    Ellouise Mose RDCS Referring Phys: [id number] BURGESS JAYSON DARE  Sonographer Comments: Technically difficult study due to poor echo windows. IMPRESSIONS  1. Left ventricular ejection fraction, by estimation, is 60 to 65%. Left ventricular ejection fraction by 2D MOD biplane is 63.5 %. The left ventricle has normal function. The left ventricle has no regional wall motion abnormalities. Left ventricular diastolic parameters were normal.  2. Right ventricular systolic function is normal. The right ventricular size is normal.  3. The mitral valve is normal in structure. Trivial mitral valve regurgitation. No evidence of mitral stenosis.  4. The aortic valve is tricuspid. Aortic valve regurgitation is not visualized. No aortic stenosis is present.  5. Aortic dilatation noted. There is dilatation of the ascending aorta, measuring 40 mm.  6. The inferior vena cava is normal in size with <50% respiratory variability, suggesting right atrial pressure of 8 mmHg. Comparison(s): Changes from prior study are noted. FINDINGS  Left Ventricle: Left ventricular ejection fraction, by estimation, is 60 to 65%. Left ventricular ejection fraction by 2D MOD biplane is 63.5 %. The left ventricle has normal function. The left ventricle has no regional wall motion abnormalities. The left ventricular internal cavity size was normal in size. There is borderline left ventricular hypertrophy. Left ventricular diastolic parameters were normal. Right Ventricle: The right ventricular size is normal. No increase in right ventricular wall thickness. Right ventricular systolic function is normal. Left Atrium: Left atrial size was normal in size. Right Atrium: Right atrial size was normal in size. Pericardium: There is no evidence of pericardial effusion. Mitral Valve: The mitral valve is normal in structure. Trivial mitral valve regurgitation. No evidence of mitral valve stenosis. Tricuspid Valve: The tricuspid valve  is normal in structure. Tricuspid valve regurgitation is not demonstrated. No evidence of tricuspid stenosis. Aortic Valve: The aortic valve is tricuspid. Aortic valve regurgitation is not visualized. No aortic stenosis is present. Pulmonic Valve: The pulmonic valve was normal in structure. Pulmonic valve regurgitation is not visualized. No evidence of pulmonic stenosis. Aorta: Aortic dilatation noted and the aortic root is normal in size and structure. There is dilatation of the ascending aorta, measuring 40 mm. Venous: The inferior vena cava is normal in size with less than 50% respiratory variability, suggesting right atrial pressure of 8 mmHg. IAS/Shunts: No atrial level shunt detected by color flow Doppler.  LEFT VENTRICLE PLAX 2D                        Biplane EF (MOD) LVIDd:         4.70 cm  LV Biplane EF:   Left LVIDs:         3.30 cm                          ventricular LV PW:         1.10 cm                          ejection LV IVS:        1.10 cm                          fraction by LVOT diam:     2.30 cm                          2D MOD LV SV:         76                               biplane is LV SV Index:   39                               63.5 %. LVOT Area:     4.15 cm LV IVRT:       95 msec         Diastology                                LV e' medial:    9.14 cm/s                                LV E/e' medial:  8.7 LV Volumes (MOD)               LV e' lateral:   10.00 cm/s LV vol d, MOD    77.3 ml       LV E/e' lateral: 7.9 A2C: LV vol d, MOD    73.5 ml A4C: LV vol s, MOD    37.0 ml A2C: LV vol s, MOD    20.2 ml A4C: LV SV MOD A2C:   40.3 ml LV SV MOD A4C:   73.5 ml LV SV MOD BP:    50.0 ml RIGHT VENTRICLE             IVC RV S prime:     15.90 cm/s  IVC diam: 1.60 cm TAPSE (M-mode): 1.8 cm LEFT ATRIUM             Index        RIGHT ATRIUM           Index LA diam:        3.10 cm 1.58 cm/m   RA Area:     10.10 cm LA Vol (A2C):   27.0 ml 13.79 ml/m  RA Volume:   18.40 ml  9.40 ml/m LA Vol  (A4C):   24.3 ml 12.42 ml/m LA Biplane Vol: 25.6 ml 13.08 ml/m  AORTIC VALVE LVOT Vmax:   109.00 cm/s LVOT Vmean:  68.600 cm/s LVOT VTI:    0.182 m  AORTA Ao Root diam: 3.80 cm Ao Asc diam:  3.87 cm MITRAL VALVE MV  Area (PHT): 4.49 cm    SHUNTS MV Decel Time: 169 msec    Systemic VTI:  0.18 m MV E velocity: 79.20 cm/s  Systemic Diam: 2.30 cm MV A velocity: 94.70 cm/s MV E/A ratio:  0.84 Franck Azobou Tonleu Electronically signed by Joelle Cedars Tonleu Signature Date/Time: 08/12/2024/2:52:56 PM    Final    CT Head Wo Contrast Result Date: 08/11/2024 EXAM: CT HEAD WITHOUT CONTRAST 08/11/2024 02:55:24 PM TECHNIQUE: CT of the head was performed without the administration of intravenous contrast. Automated exposure control, iterative reconstruction, and/or weight based adjustment of the mA/kV was utilized to reduce the radiation dose to as low as reasonably achievable. COMPARISON: 05/03/2017 CLINICAL HISTORY: Confusion, unwitnessed fall earlier. FINDINGS: BRAIN AND VENTRICLES: No acute hemorrhage. No evidence of acute infarct. No hydrocephalus. No extra-axial collection. No mass effect or midline shift. ORBITS: No acute abnormality. SINUSES: No acute abnormality. SOFT TISSUES AND SKULL: No acute soft tissue abnormality. No skull fracture. IMPRESSION: 1. No acute intracranial abnormality. Electronically signed by: Franky Crease MD 08/11/2024 03:39 PM EST RP Workstation: [id number]   CT Angio Chest/Abd/Pel for Dissection W and/or W/WO Result Date: 08/11/2024 EXAM: CTA CHEST, ABDOMEN AND PELVIS WITHOUT AND WITH CONTRAST 08/11/2024 02:55:24 PM TECHNIQUE: CTA of the chest was performed without and with the administration of intravenous contrast. CTA of the abdomen and pelvis was performed without and with the administration of intravenous contrast. 100 mL iohexol  (OMNIPAQUE ) 350 MG/ML injection 100 mL IOHEXOL  350 MG/ML SOLN was administered. Multiplanar reformatted images are provided for review. MIP images are  provided for review. Automated exposure control, iterative reconstruction, and/or weight based adjustment of the mA/kV was utilized to reduce the radiation dose to as low as reasonably achievable. COMPARISON: CT abdomen and pelvis 07/30/2024. CT angio chest 07/05/2024. CLINICAL HISTORY: GI bleed. FINDINGS: VASCULATURE: AORTA: Scattered aortic atherosclerosis. No aneurysm or dissection. PULMONARY ARTERIES: No pulmonary embolism with the limits of this exam. GREAT VESSELS OF AORTIC ARCH: No acute finding. No dissection. No arterial occlusion or significant stenosis. CELIAC TRUNK: No acute finding. No occlusion or significant stenosis. SUPERIOR MESENTERIC ARTERY: No acute finding. No occlusion or significant stenosis. INFERIOR MESENTERIC ARTERY: No acute finding. No occlusion or significant stenosis. RENAL ARTERIES: No acute finding. No occlusion or significant stenosis. ILIAC ARTERIES: No acute finding. No occlusion or significant stenosis. CHEST: MEDIASTINUM: Extensive 3 vessel coronary artery disease. No mediastinal lymphadenopathy. The heart and pericardium demonstrate no acute abnormality. LUNGS AND PLEURA: Small scattered pulmonary nodules, index nodule in the posterior left lower lobe measures 6 mm on image 93 compared to 5 mm previously, not significantly changed. Index posterior right lower lobe nodule on image 87 measures 4 mm, stable. No new or significantly enlarging nodule. No focal consolidation or pulmonary edema. No evidence of pleural effusion or pneumothorax. THORACIC BONES AND SOFT TISSUES: No acute bone or soft tissue abnormality. ABDOMEN AND PELVIS: LIVER: The liver is unremarkable. GALLBLADDER AND BILE DUCTS: Gallbladder is unremarkable. No biliary ductal dilatation. SPLEEN: The spleen is unremarkable. PANCREAS: The pancreas is unremarkable. ADRENAL GLANDS: Bilateral adrenal glands demonstrate no acute abnormality. KIDNEYS, URETERS AND BLADDER: No stones in the kidneys or ureters. No  hydronephrosis. No perinephric or periureteral stranding. Urinary bladder is unremarkable. GI AND BOWEL: Lap band noted in place around the proximal stomach. This is unchanged. Stomach and duodenal sweep demonstrate no acute abnormality. There is no bowel obstruction. No abnormal bowel wall thickening or distension. REPRODUCTIVE: Reproductive organs are unremarkable. PERITONEUM AND RETROPERITONEUM: No ascites or free air. LYMPH  NODES: No lymphadenopathy. ABDOMINAL BONES AND SOFT TISSUES: No acute abnormality of the bones. No acute soft tissue abnormality. IMPRESSION: 1. No evidence of aortic aneurysm or dissection. 2. No evidence of pulmonary embolism. 3. Extensive coronary artery disease. 4. Small scattered pulmonary nodules, with the largest in the posterior left lower lobe measuring 6 mm and without significant interval change; management per Fleischner Society Guidelines is recommended with follow-up chest CT in 1 year. Electronically signed by: Franky Crease MD 08/11/2024 03:38 PM EST RP Workstation: [id number]    Assessment / Plan:  58 year old male admitted 08/11/2024 with syncope, melena and IDA. Hg 11.2. BUN 9. CTA showed prior lap band noted in place around the proximal stomach and no evidence of bowel wall thickening or obstruction. Received IV iron x 1. On oral iron.  Patient completed 2 doses of Suprep, last dose completed at 5 AM and he continues to pass watery brown stool at this time.  Hemodynamically stable. - NPO - IV fluids per the hospitalist  - Patient's RN will contact our GI service if patient does not completely clear out from 2nd dose of Suprep within the next 2 hours.  - Patient to proceed EGD and colonoscopy scheduled this afternoon with Dr. Wilhelmenia  - Continue Pantoprazole  40 mg daily - Ondansetron  4 mg p.o. or IV every 6 hours as needed  Past Lab band surgery, band deflated   Pulmonary nodules per CTA  Coronary artery disease. CTA showed extensive 3 vessel coronary  artery disease. LV EF 60 - 65  % per ECHO 12/3.  No angina. - Recommend outpatient cardiology evaluation  Principal Problem:   Upper GI bleed Active Problems:   Dark stools   Change in bowel habits   Chronic bilateral lower abdominal pain   Malnutrition of moderate degree     LOS: 0 days   Elida HERO Kennedy-Smith  08/13/2024, 9:30 AM

## 2024-08-14 ENCOUNTER — Encounter (HOSPITAL_COMMUNITY): Payer: Self-pay | Admitting: Gastroenterology

## 2024-08-14 ENCOUNTER — Telehealth: Payer: Self-pay

## 2024-08-14 DIAGNOSIS — K5520 Angiodysplasia of colon without hemorrhage: Secondary | ICD-10-CM

## 2024-08-14 DIAGNOSIS — K921 Melena: Secondary | ICD-10-CM

## 2024-08-14 DIAGNOSIS — D508 Other iron deficiency anemias: Secondary | ICD-10-CM

## 2024-08-14 LAB — GLUCOSE, CAPILLARY
Glucose-Capillary: 119 mg/dL — ABNORMAL HIGH (ref 70–99)
Glucose-Capillary: 99 mg/dL (ref 70–99)

## 2024-08-14 LAB — CBC
HCT: 36.5 % — ABNORMAL LOW (ref 39.0–52.0)
Hemoglobin: 11.1 g/dL — ABNORMAL LOW (ref 13.0–17.0)
MCH: 25.2 pg — ABNORMAL LOW (ref 26.0–34.0)
MCHC: 30.4 g/dL (ref 30.0–36.0)
MCV: 82.8 fL (ref 80.0–100.0)
Platelets: 306 K/uL (ref 150–400)
RBC: 4.41 MIL/uL (ref 4.22–5.81)
RDW: 19.3 % — ABNORMAL HIGH (ref 11.5–15.5)
WBC: 8.6 K/uL (ref 4.0–10.5)
nRBC: 0 % (ref 0.0–0.2)

## 2024-08-14 LAB — BASIC METABOLIC PANEL WITH GFR
Anion gap: 14 (ref 5–15)
BUN: 9 mg/dL (ref 6–20)
CO2: 25 mmol/L (ref 22–32)
Calcium: 9.4 mg/dL (ref 8.9–10.3)
Chloride: 103 mmol/L (ref 98–111)
Creatinine, Ser: 0.96 mg/dL (ref 0.61–1.24)
GFR, Estimated: >60 mL/min (ref >60)
Glucose, Bld: 85 mg/dL (ref 70–99)
Potassium: 4.1 mmol/L (ref 3.5–5.1)
Sodium: 142 mmol/L (ref 135–145)

## 2024-08-14 MED ORDER — POLYETHYLENE GLYCOL 3350 17 G PO PACK: 17.0000 g | PACK | Freq: Every day | ORAL | Status: DC

## 2024-08-14 MED ORDER — ADULT MULTIVITAMIN W/MINERALS CH
1.0000 | ORAL_TABLET | Freq: Every day | ORAL | 0 refills | Status: AC
  Filled 2024-08-14: qty 30, 30d supply, fill #0

## 2024-08-14 MED ORDER — IRON SUCROSE 200 MG IVPB - SIMPLE MED
200.0000 mg | Freq: Once | Status: AC
  Administered 2024-08-14: 200 mg via INTRAVENOUS
  Filled 2024-08-14: qty 200

## 2024-08-14 MED ORDER — POLYVINYL ALCOHOL 1.4 % OP SOLN
1.0000 [drp] | OPHTHALMIC | Status: DC | PRN
  Filled 2024-08-14: qty 15

## 2024-08-14 NOTE — Telephone Encounter (Signed)
 Spoke with pt. Discussed capsule endoscopy and instructions. Pt agrees to schedule appt. Appt scheduled for Monday 08/31/24. Instructions sent in my chart and will mail instructions as well. Amb referral ordered.

## 2024-08-14 NOTE — Progress Notes (Signed)
 AVS given to patient and explained at the bedside. Medications and follow up appointments have been explained with pt verbalizing understanding.

## 2024-08-14 NOTE — Progress Notes (Cosign Needed Addendum)
 Kevin [name]  CC:  Anemia, dark stool   Subjective: Patient stated he feels remarkably better today. He stated his chronic lower abdominal pain completed abated following his upper endoscopy and colonoscopy yesterday. He also feels his urination has improved, able to go longer periods of time without urinating which is a significant relief for him. He wishes to go home. No CP or SOB.   Objective:   EGD 08/13/2024: - No gross lesions in the entire esophagus. Z-line irregular, 36 cm from the incisors.  - 1 cm hiatal hernia.  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  - Erythematous mucosa in the antrum. No other gross lesions in the entire stomach. Biopsied.  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 Treated with argon plasma coagulation (APC).  - No other gross lesions in the duodenal bulb, in the first portion of the duodenum, in the second portion of the duodenum and in the third portion of the duodenum. Biopsied.  - Normal major papilla. - Started Pantoprazole  40 mg p.o. daily  Colonoscopy 08/13/2024: - Hemorrhoids found on digital rectal exam.  - The examined portion of the ileum was normal.  - Five 2 to 6 mm polyps at the recto-sigmoid colon, in the transverse colon, at the hepatic flexure and in the cecum, removed with a cold snare. Resected and retrieved.  - Normal mucosa in the entire examined colon otherwise. Biopsied.  - Non-bleeding non-thrombosed internal hemorrhoids  Vital signs in last 24 hours: Temp:  [97.3 F (36.3 C)-98.7 F (37.1 C)] 98.2 F (36.8 C) (12/05 0501) Pulse Rate:  [77-109] 95 (12/05 0501) Resp:  [11-21] 16 (12/05 0501) BP: (87-159)/(54-102) 159/102 (12/05 0501) SpO2:  [96 %-100 %] 98 % (12/05 0501) Weight:  [84 kg] 84 kg (12/04 1436) Last BM Date : 08/13/24 General: Alert 58 year old male in NAD. Heart: RRR, no murmurs.  Pulm: Breath sounds clear. On room air.  Abdomen: Soft. Nondistended. Nontender.  Positive bowel sounds x 4 quadrants.  Extremities: No lower extremity edema. Neurologic:  Alert and oriented x 4. Grossly normal neurologically. Psych:  Alert and cooperative. Normal mood and affect.  Intake/Output from previous day: 12/04 0701 - 12/05 0700 In: 2118.8 [P.O.:480; I.V.:1638.8] Out: -  Intake/Output this shift: No intake/output data recorded.  Lab Results: Recent Labs    08/12/24 2110 08/13/24 0401 08/14/24 0349  WBC 10.5 7.3 8.6  HGB 11.2* 11.2* 11.1*  HCT 35.9* 36.9* 36.5*  PLT 327 248 306   BMET Recent Labs    08/12/24 0343 08/13/24 0401 08/14/24 0349  NA 140 139 142  K 4.4 4.1 4.1  CL 105 102 103  CO2 24 21* 25  GLUCOSE 86 144* 85  BUN 8 9 9   CREATININE 1.15 1.07 0.96  CALCIUM  9.3 9.2 9.4   LFT Recent Labs    08/11/24 1313  PROT 8.1  ALBUMIN 4.9  AST 28  ALT 28  ALKPHOS 92  BILITOT 0.5   PT/INR No results for input(s): LABPROT, INR in the last 72 hours. Hepatitis Panel No results for input(s): HEPBSAG, HCVAB, HEPAIGM, HEPBIGM in the last 72 hours.  CT HEAD WO CONTRAST ( ) Result Date: 08/12/2024 EXAM: CT HEAD WITHOUT CONTRAST 08/12/2024 08:32:00 PM TECHNIQUE: CT of the head was performed without the administration of intravenous contrast. Automated exposure control, iterative reconstruction, and/or weight based adjustment of the mA/kV was utilized to reduce the radiation dose to as low as reasonably achievable. COMPARISON:  08/11/2024 CLINICAL HISTORY: Syncope/presyncope, cerebrovascular cause suspected FINDINGS: BRAIN AND VENTRICLES: No acute hemorrhage. No evidence of acute infarct. No hydrocephalus. No extra-axial collection. No mass effect or midline shift. ORBITS: No acute abnormality. SINUSES: No acute abnormality. SOFT TISSUES AND SKULL: No acute soft tissue abnormality. No skull fracture. IMPRESSION: 1. No acute intracranial abnormality. Normal CT of the head for age Electronically signed by: Lonni Necessary MD  08/12/2024 09:14 PM EST RP Workstation: [id number]   ECHOCARDIOGRAM COMPLETE Result Date: 08/12/2024    ECHOCARDIOGRAM REPORT   Patient Name:   Kevin [name] Date of Exam: 08/12/2024 Medical Rec #:  [id number]   Height:       67.0 in Accession #:    [id number]  Weight:       185.2 lb Date of Birth:  1966-08-14   BSA:          1.957 m Patient Age:    58 years    BP:           135/84 mmHg Patient Gender: M           HR:           86 bpm. Exam Location:  Inpatient Procedure: 2D Echo, Cardiac Doppler and Color Doppler (Both Spectral and Color            Flow Doppler were utilized during procedure). Indications:    I42.9 Cardiomyopathy (unspecified); R55 Syncope  History:        Patient has prior history of Echocardiogram examinations, most                 recent 03/26/2018. Signs/Symptoms:Chest Pain; Risk                 Factors:Hypertension, Diabetes and Former Smoker.  Sonographer:    Ellouise Mose RDCS Referring Phys: [id number] BURGESS JAYSON DARE  Sonographer Comments: Technically difficult study due to poor echo windows. IMPRESSIONS  1. Left ventricular ejection fraction, by estimation, is 60 to 65%. Left ventricular ejection fraction by 2D MOD biplane is 63.5 %. The left ventricle has normal function. The left ventricle has no regional wall motion abnormalities. Left ventricular diastolic parameters were normal.  2. Right ventricular systolic function is normal. The right ventricular size is normal.  3. The mitral valve is normal in structure. Trivial mitral valve regurgitation. No evidence of mitral stenosis.  4. The aortic valve is tricuspid. Aortic valve regurgitation is not visualized. No aortic stenosis is present.  5. Aortic dilatation noted. There is dilatation of the ascending aorta, measuring 40 mm.  6. The inferior vena cava is normal in size with <50% respiratory variability, suggesting right atrial pressure of 8 mmHg. Comparison(s): Changes from prior study are noted. FINDINGS  Left Ventricle: Left ventricular  ejection fraction, by estimation, is 60 to 65%. Left ventricular ejection fraction by 2D MOD biplane is 63.5 %. The left ventricle has normal function. The left ventricle has no regional wall motion abnormalities. The left ventricular internal cavity size was normal in size. There is borderline left ventricular hypertrophy. Left ventricular diastolic parameters were normal. Right Ventricle: The right ventricular size is normal. No increase in right ventricular wall thickness. Right ventricular systolic function is normal. Left Atrium: Left atrial size was normal in size. Right Atrium: Right atrial size was normal in size. Pericardium: There is no evidence of pericardial effusion. Mitral Valve: The mitral valve is normal in structure. Trivial mitral valve regurgitation. No evidence of mitral valve stenosis. Tricuspid Valve: The tricuspid valve is  normal in structure. Tricuspid valve regurgitation is not demonstrated. No evidence of tricuspid stenosis. Aortic Valve: The aortic valve is tricuspid. Aortic valve regurgitation is not visualized. No aortic stenosis is present. Pulmonic Valve: The pulmonic valve was normal in structure. Pulmonic valve regurgitation is not visualized. No evidence of pulmonic stenosis. Aorta: Aortic dilatation noted and the aortic root is normal in size and structure. There is dilatation of the ascending aorta, measuring 40 mm. Venous: The inferior vena cava is normal in size with less than 50% respiratory variability, suggesting right atrial pressure of 8 mmHg. IAS/Shunts: No atrial level shunt detected by color flow Doppler.  LEFT VENTRICLE PLAX 2D                        Biplane EF (MOD) LVIDd:         4.70 cm         LV Biplane EF:   Left LVIDs:         3.30 cm                          ventricular LV PW:         1.10 cm                          ejection LV IVS:        1.10 cm                          fraction by LVOT diam:     2.30 cm                          2D MOD LV SV:         76                                biplane is LV SV Index:   39                               63.5 %. LVOT Area:     4.15 cm LV IVRT:       95 msec         Diastology                                LV e' medial:    9.14 cm/s                                LV E/e' medial:  8.7 LV Volumes (MOD)               LV e' lateral:   10.00 cm/s LV vol d, MOD    77.3 ml       LV E/e' lateral: 7.9 A2C: LV vol d, MOD    73.5 ml A4C: LV vol s, MOD    37.0 ml A2C: LV vol s, MOD    20.2 ml A4C: LV SV MOD A2C:   40.3 ml LV SV MOD A4C:   73.5 ml LV SV MOD BP:    50.0 ml RIGHT VENTRICLE  IVC RV S prime:     15.90 cm/s  IVC diam: 1.60 cm TAPSE (M-mode): 1.8 cm LEFT ATRIUM             Index        RIGHT ATRIUM           Index LA diam:        3.10 cm 1.58 cm/m   RA Area:     10.10 cm LA Vol (A2C):   27.0 ml 13.79 ml/m  RA Volume:   18.40 ml  9.40 ml/m LA Vol (A4C):   24.3 ml 12.42 ml/m LA Biplane Vol: 25.6 ml 13.08 ml/m  AORTIC VALVE LVOT Vmax:   109.00 cm/s LVOT Vmean:  68.600 cm/s LVOT VTI:    0.182 m  AORTA Ao Root diam: 3.80 cm Ao Asc diam:  3.87 cm MITRAL VALVE MV Area (PHT): 4.49 cm    SHUNTS MV Decel Time: 169 msec    Systemic VTI:  0.18 m MV E velocity: 79.20 cm/s  Systemic Diam: 2.30 cm MV A velocity: 94.70 cm/s MV E/A ratio:  0.84 Franck Azobou Tonleu Electronically signed by Joelle Cedars Tonleu Signature Date/Time: 08/12/2024/2:52:56 PM    Final     Assessment / Plan:  58 year old male admitted 08/11/2024 with syncope, melena and IDA. Hg 11.2 -> 11.1. CTA showed prior lap band noted in place around the proximal stomach and no evidence of bowel wall thickening or obstruction. Received IV iron  x 1. On oral iron .  EGD 12/4 identified a 1 cm vein hernia, gastric pouch from lap band noted, erythematous mucosa in the antrum and a single angioectasia in the duodenal bulb treated with APC. Colonoscopy 12/4 identified five 2 to 6 mm polyps removed from the colon and nonbleeding hemorrhoids. - Diet as tolerated  - Our  office will contact patient with EGD and colonoscopy path result - Consider IV iron  prior to discharge versus expedited infusions as an outpatient - Restart oral iron  in 1 week - Continue Pantoprazole  40mg  po QD - Our office will contact the patient to facilitate GI follow up and to schedule a small bowel capsule endoscopy to complete evaluation for IDA - Okay to discharge home today from GI perspective  Chronic lower abdominal pain. Patient stated his lower abdominal pain abated yesterday following his EGD and colonoscopy, likely due to aggressive bowel prep with stool evacuation. Avoid constipation, colonic stool burden.  - Outpatient GI follow-up as noted above - High-fiber diet  - FiberCon 1-2 tabs p.o. daily - Miralax  Q HS to facilitate stool output   Past Lab band surgery, band deflated    Pulmonary nodules per CTA - Follow up with PCP   Coronary artery disease. CTA showed extensive 3 vessel coronary artery disease. LV EF 60 - 65  % per ECHO 12/3.  No angina. - Recommend outpatient cardiology evaluation  Principal Problem:   Upper GI bleed Active Problems:   Dark stools   Change in bowel habits   Chronic bilateral lower abdominal pain   Malnutrition of moderate degree   Melena   Gastritis without bleeding   AVM (arteriovenous malformation) of small bowel, acquired   History of colonic polyps     LOS: 1 day   Elida HERO Kennedy-Smith  08/14/2024, 10:03 AM     Attending Physician's Attestation   I have taken an interval history, reviewed the chart and examined the patient.   Patient is stable for discharge at this time.  Happy that  he is feeling slightly better, though not clear how long this would last.  Video capsule endoscopy to be arranged as outpatient with follow-up with primary GI thereafter, to continue and rule out other etiologies for potential iron  deficiency anemia.  Aggressive bowel regimen is reasonable to keep his bowels moving, with MiraLAX  on a daily  basis and could consider other laxative therapy in future if needed.  We will sign off at this time.  I agree with the Advanced Practitioner's [name], impression, and recommendations with updates and my documentation as noted above.  The majority of the medical decision making/process, formulation of the impression/plan of action for the patient were performed by me with substantive portion of this encounter (>50% time spent including complete performance of at least one of the key components of MDM, History, and/or Exam).   Aloha Finner, MD [facility] Office # [phone]

## 2024-08-14 NOTE — Addendum Note (Signed)
 Addendum  created 08/14/24 1856 by Epifanio Charleston, MD   Intraprocedure Staff edited

## 2024-08-14 NOTE — Progress Notes (Signed)
 PT Screen Note  Patient Details Name: Koby Pickup MRN: [id number] DOB: 1966/04/01   Cancelled Treatment:    Reason Eval/Treat Not Completed: PT screened, no needs identified, will sign off  Pt laying in bench seat at arrival and dressed in pajamas and robe.  Sat up without difficulty. Reports has ambulated multiple laps independently in hallway without issue.  Noted orthostatic BP have been stable and pt denies further lightheadedness.  Pt did have falls prior to admission but was related to low hgb.  Pt with no acute PT needs.   Maribella Kuna, PT Acute [facility] [phone]  Benjiman VEAR Mulberry 08/14/2024, 12:35 PM

## 2024-08-14 NOTE — Progress Notes (Signed)
 OT Cancellation Note  Patient Details Name: Kevin [name] MRN: [id number] DOB: 29-Mar-1966   Cancelled Treatment:    Reason Eval/Treat Not Completed: OT screened, no needs identified, will sign off. Per PT, pt at functional baseline. Has been ambulating independently in hallway without difficulties. No acute OT needs, OT to complete orders and sign off.   Juliya Magill L. Banyan Goodchild, OTR/L  08/14/24, 12:44 PM

## 2024-08-14 NOTE — Telephone Encounter (Signed)
-----   Message from Nurse Almarie HERO sent at 08/14/2024  9:29 AM EST ----- Regarding: FW: Follow-up  ----- Message ----- From: Wilhelmenia Aloha Raddle., MD Sent: 08/13/2024   5:25 PM EST To: Victory LITTIE Legrand DOUGLAS, MD; Elida HERO Kennedy-Smi# Subject: Follow-up                                      Pod C nurses, This patient will need an outpatient video capsule endoscopy for further workup of iron  deficiency anemia. His EGD/colonoscopy was completed as an inpatient. He had a small bowel AVM that was ablated but no other significant source for his iron  deficiency. Please schedule as able for Dr. Legrand. Thanks. GM  FYI HD he will be discharged likely tomorrow (Friday).

## 2024-08-16 ENCOUNTER — Other Ambulatory Visit: Payer: Self-pay

## 2024-08-16 ENCOUNTER — Emergency Department (HOSPITAL_COMMUNITY)

## 2024-08-16 ENCOUNTER — Encounter (HOSPITAL_COMMUNITY): Payer: Self-pay

## 2024-08-16 ENCOUNTER — Emergency Department (HOSPITAL_COMMUNITY)
Admission: EM | Admit: 2024-08-16 | Discharge: 2024-08-16 | Discharge status: Against Medical Advice | Attending: Emergency Medicine | Admitting: Emergency Medicine

## 2024-08-16 DIAGNOSIS — K922 Gastrointestinal hemorrhage, unspecified: Secondary | ICD-10-CM | POA: Diagnosis not present

## 2024-08-16 DIAGNOSIS — I809 Phlebitis and thrombophlebitis of unspecified site: Secondary | ICD-10-CM

## 2024-08-16 LAB — CBC WITH DIFFERENTIAL/PLATELET
Abs Immature Granulocytes: 0.04 K/uL (ref 0.00–0.07)
Basophils Absolute: 0.1 K/uL (ref 0.0–0.1)
Basophils Relative: 1 %
Eosinophils Absolute: 0.1 K/uL (ref 0.0–0.5)
Eosinophils Relative: 1 %
HCT: 38.6 % — ABNORMAL LOW (ref 39.0–52.0)
Hemoglobin: 11.9 g/dL — ABNORMAL LOW (ref 13.0–17.0)
Immature Granulocytes: 0 %
Lymphocytes Relative: 16 %
Lymphs Abs: 1.4 K/uL (ref 0.7–4.0)
MCH: 25.2 pg — ABNORMAL LOW (ref 26.0–34.0)
MCHC: 30.8 g/dL (ref 30.0–36.0)
MCV: 81.6 fL (ref 80.0–100.0)
Monocytes Absolute: 0.6 K/uL (ref 0.1–1.0)
Monocytes Relative: 6 %
Neutro Abs: 7 K/uL (ref 1.7–7.7)
Neutrophils Relative %: 76 %
Platelets: 317 K/uL (ref 150–400)
RBC: 4.73 MIL/uL (ref 4.22–5.81)
RDW: 19 % — ABNORMAL HIGH (ref 11.5–15.5)
WBC: 9.2 K/uL (ref 4.0–10.5)
nRBC: 0 % (ref 0.0–0.2)

## 2024-08-16 LAB — BASIC METABOLIC PANEL WITH GFR
Anion gap: 12 (ref 5–15)
BUN: 7 mg/dL (ref 6–20)
CO2: 26 mmol/L (ref 22–32)
Calcium: 9.6 mg/dL (ref 8.9–10.3)
Chloride: 103 mmol/L (ref 98–111)
Creatinine, Ser: 0.95 mg/dL (ref 0.61–1.24)
GFR, Estimated: >60 mL/min (ref >60)
Glucose, Bld: 155 mg/dL — ABNORMAL HIGH (ref 70–99)
Potassium: 3.9 mmol/L (ref 3.5–5.1)
Sodium: 141 mmol/L (ref 135–145)

## 2024-08-16 MED ORDER — KETOROLAC TROMETHAMINE 15 MG/ML IJ SOLN
15.0000 mg | Freq: Once | INTRAMUSCULAR | Status: AC
  Administered 2024-08-16: 15 mg via INTRAMUSCULAR
  Filled 2024-08-16: qty 1

## 2024-08-16 NOTE — Discharge Summary (Signed)
 Physician Discharge Summary   Patient: Kevin [name] MRN: [id number] DOB: 1966/04/28  Admit date:     08/16/2024  Discharge date:   Discharge Physician: Brigida Bureau   PCP: Theotis Haze ORN, NP   Recommendations at discharge:     Discharge to home. Follow up with PCP in 7-10 days. Have hemoglobin checked at that time.  Follow up with GI as arranged for by GI Keep appointment for capsule endoscopy as arranged by GI on 08/31/2024.    Discharge Diagnoses: Principal Problem:   Upper GI bleed Active Problems:   Dark stools   Change in bowel habits   Chronic bilateral lower abdominal pain   Malnutrition of moderate degree   Melena   Gastritis without bleeding   AVM (arteriovenous malformation) of small bowel, acquired   History of colonic polyps   Resolved Problems:   * No resolved hospital problems. *  Hospital Course: 58 year old with history of melena, chronic abdominal pain, anxiety, non-insulin -dependent DM 2, HTN admitted to the hospital for progressive weight loss, weakness and syncope.  He has dealt with episodes of melena over the last decade secondary to abnormal polyps.  He follows outpatient [facility] and was scheduled for colonoscopy on 12/4 but due to syncopal event he ended up coming to the hospital. Patient seen by GI with plans for endoscopic evaluation both EGD and colonoscopy.EGD with Dr. Rondell on 08/14/2024. It demonstrated : - No gross lesions in the entire esophagus. Z-line irregular, 36 cm from the incisors.  - 1 cm hiatal hernia.  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  - Erythematous mucosa in the antrum. No other gross lesions in the entire stomach. Biopsied.  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 Treated with argon plasma coagulation (APC).  - No other gross lesions in the duodenal bulb, in the first portion of the duodenum, in the second portion of the duodenum and in the third portion of the duodenum. Biopsied.  - Normal major  papilla. - Started Pantoprazole  40 mg p.o. daily   Colonoscopy was also performed on 08/13/2024. It demonstrated: - Hemorrhoids found on digital rectal exam.  - The examined portion of the ileum was normal.  - Five 2 to 6 mm polyps at the recto-sigmoid colon, in the transverse colon, at the hepatic flexure and in the cecum, removed with a cold snare. Resected and retrieved.  - Normal mucosa in the entire examined colon otherwise. Biopsied.  - Non-bleeding non-thrombosed internal hemorrhoids  On 08/11/2024 the patient had a syncopal episode while having a BM as part of the bowel prep. He felt he was going to pass out while bearing down. He lowered himself to the floor before  passing out. He was found face down with a laceration to the bridge of his nose. When he awoke he also had pain around his forehead. He was given 500 cc bolus. No active bleeding was noted. He was hemodynamically stable. This was felt to occur as a result of vasovagal reflex .  Assessment and Plan:  Syncope with fall - Possibly in the setting of GI bleed.  Echocardiogram shows preserved EF.  EKG showing PACs -CT head negative, repeat CT head due to another fall in the hospital is also negative - Orthostatics,  -TSH was normal -- random cortisol was normal  -- PT/OT evaluated the patient and determined that the patient has no further PT needs --Syncopal episode is thought to be due to vasovagal syncope  in the setting of decreased volume due to bowel prep. The patient was given a 500 cc bolus  GI bleed Acute on chronic blood loss anemia Iron  deficiency with microcytosis EGD with Dr. Rondell on 08/14/2024. It demonstrated : - No gross lesions in the entire esophagus. Z-line irregular, 36 cm from the incisors.  - 1 cm hiatal hernia.  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  - Erythematous mucosa in the antrum. No other gross lesions in the entire stomach. Biopsied.  - A single angioectasia in the  duodenum bulb. Treated with argon plasma coagulation (APC).  - No other gross lesions in the duodenal bulb, in the first portion of the duodenum, in the second portion of the duodenum and in the third portion of the duodenum. Biopsied.  - Normal major papilla. - Started Pantoprazole  40 mg p.o. daily   Colonoscopy was also performed on 08/13/2024. It demonstrated: - Hemorrhoids found on digital rectal exam.  - The examined portion of the ileum was normal.  - Five 2 to 6 mm polyps at the recto-sigmoid colon, in the transverse colon, at the hepatic flexure and in the cecum, removed with a cold snare. Resected and retrieved.  - Normal mucosa in the entire examined colon otherwise. Biopsied.  - Non-bleeding non-thrombosed internal hemorrhoids - Hemoglobin currently at baseline 11.1. - Iron  studies showing low saturation therefore The patient has had a total of 600 mg of venofer  by infusion. This may be continued as outpatient by PCP or GI as appropriate.   Laparoscopic gastric band -Initially placed in 2006, recently laparoscopic band fluid was removed with some relief in his symptoms.  Plan is to eventually remove the band.   Non-insulin -dependent type 2 diabetes-well-controlled, last A1c 6.3. -Sliding scale and Accu-Cheks   Hypertension -Holding ARB.  Continue Norvasc .  IV as needed   Progressive weight loss and weakness - Dietitian consulted.  CT chest abdomen pelvis does not show any occult malignancy but obviously this is not the best test therefore recommend outpatient follow-up with PCP to make sure he is up-to-date with all of his screenings.   Pulmonary nodule - Multiple nodules including one 6 mm.  Recommend outpatient follow-up and a repeat scan in 6 months to 1 year   DVT prophylaxis: SCDs Start: 08/11/24 1833    Code Status: Full Code Family Communication: None available  Consultants: GI Procedures performed: EGD and Colonoscopy   Disposition: Home Diet recommendation:   Cardiac and Carb modified diet DISCHARGE MEDICATION: Allergies as of 08/16/2024       Reactions   Nsaids Other (See Comments)   Lap band   Tramadol  Other (See Comments)   Seizures        Medication List     TAKE these medications    amLODipine  10 MG tablet Commonly known as: NORVASC  Take 1 tablet (10 mg total) by mouth daily.   ascorbic acid 500 MG tablet Commonly known as: VITAMIN C Take 500-1,000 mg by mouth daily.   atorvastatin  40 MG tablet Commonly known as: LIPITOR Take 1 tablet (40 mg total) by mouth daily.   folic acid 800 MCG tablet Commonly known as: FOLVITE Take 800 mcg by mouth daily.   glimepiride  4 MG tablet Commonly known as: AMARYL  Take 1 tablet (4 mg total) by mouth daily with breakfast.   Janumet  50-1000 MG tablet Generic drug: sitaGLIPtin -metformin  Take 1 tablet by mouth 2 (two) times daily with a meal. What changed:  when to take this  additional instructions   multivitamin with minerals Tabs tablet Take 1 tablet by mouth daily.   omeprazole  20 MG capsule Commonly known as: PRILOSEC Take 1 capsule (20 mg total) by mouth 2 (two) times daily before a meal. What changed:  when to take this additional instructions   True Metrix Blood Glucose Test test strip Generic drug: glucose blood Use as instructed. Check blood glucose level by fingerstick twice per day.   TRUEplus Lancets 28G Misc Use as instructed. Check blood glucose level by fingerstick twice per day.   valsartan  160 MG tablet Commonly known as: DIOVAN  Take 1 tablet (160 mg total) by mouth daily.   VITAMIN B12 PO Take 1 tablet by mouth daily.         Discharge Exam: There were no vitals filed for this visit. Exam:  Constitutional:  The patient is awake, alert, and oriented x 3. No acute distress. Eyes:  pupils and irises appear normal Normal lids and conjunctivae ENMT:  grossly normal hearing  Lips appear normal external ears, nose appear  normal Oropharynx: mucosa, tongue,posterior pharynx appear normal Neck:  neck appears normal, no masses, normal ROM, supple no thyromegaly Respiratory:  No increased work of breathing. No wheezes, rales, or rhonchi No tactile fremitus Cardiovascular:  Regular rate and rhythm No murmurs, ectopy, or gallups. No lateral PMI. No thrills. Abdomen:  Abdomen is soft, non-tender, non-distended No hernias, masses, or organomegaly Normoactive bowel sounds.  Musculoskeletal:  No cyanosis, clubbing, or edema Skin:  No rashes, lesions, ulcers palpation of skin: no induration or nodules Neurologic:  CN 2-12 intact Sensation all 4 extremities intact Psychiatric:  Mental status Mood, affect appropriate Orientation to person, place, time  judgment and insight appear intact   Condition at discharge: fair  The results of significant diagnostics from this hospitalization (including imaging, microbiology, ancillary and laboratory) are listed below for reference.   Imaging Studies: CT HEAD WO CONTRAST ( ) Result Date: 08/12/2024 EXAM: CT HEAD WITHOUT CONTRAST 08/12/2024 08:32:00 PM TECHNIQUE: CT of the head was performed without the administration of intravenous contrast. Automated exposure control, iterative reconstruction, and/or weight based adjustment of the mA/kV was utilized to reduce the radiation dose to as low as reasonably achievable. COMPARISON: 08/11/2024 CLINICAL HISTORY: Syncope/presyncope, cerebrovascular cause suspected FINDINGS: BRAIN AND VENTRICLES: No acute hemorrhage. No evidence of acute infarct. No hydrocephalus. No extra-axial collection. No mass effect or midline shift. ORBITS: No acute abnormality. SINUSES: No acute abnormality. SOFT TISSUES AND SKULL: No acute soft tissue abnormality. No skull fracture. IMPRESSION: 1. No acute intracranial abnormality. Normal CT of the head for age Electronically signed by: Lonni Necessary MD 08/12/2024 09:14 PM EST RP Workstation:  [id number]   ECHOCARDIOGRAM COMPLETE Result Date: 08/12/2024    ECHOCARDIOGRAM REPORT   Patient Name:   WILBURN KEIR Date of Exam: 08/12/2024 Medical Rec #:  [id number]   Height:       67.0 in Accession #:    [id number]  Weight:       185.2 lb Date of Birth:  1966-07-23   BSA:          1.957 m Patient Age:    58 years    BP:           135/84 mmHg Patient Gender: M           HR:           86 bpm. Exam Location:  Inpatient Procedure: 2D Echo, Cardiac Doppler and Color Doppler (Both Spectral and Color  Flow Doppler were utilized during procedure). Indications:    I42.9 Cardiomyopathy (unspecified); R55 Syncope  History:        Patient has prior history of Echocardiogram examinations, most                 recent 03/26/2018. Signs/Symptoms:Chest Pain; Risk                 Factors:Hypertension, Diabetes and Former Smoker.  Sonographer:    Ellouise Mose RDCS Referring Phys: [id number] BURGESS JAYSON DARE  Sonographer Comments: Technically difficult study due to poor echo windows. IMPRESSIONS  1. Left ventricular ejection fraction, by estimation, is 60 to 65%. Left ventricular ejection fraction by 2D MOD biplane is 63.5 %. The left ventricle has normal function. The left ventricle has no regional wall motion abnormalities. Left ventricular diastolic parameters were normal.  2. Right ventricular systolic function is normal. The right ventricular size is normal.  3. The mitral valve is normal in structure. Trivial mitral valve regurgitation. No evidence of mitral stenosis.  4. The aortic valve is tricuspid. Aortic valve regurgitation is not visualized. No aortic stenosis is present.  5. Aortic dilatation noted. There is dilatation of the ascending aorta, measuring 40 mm.  6. The inferior vena cava is normal in size with <50% respiratory variability, suggesting right atrial pressure of 8 mmHg. Comparison(s): Changes from prior study are noted. FINDINGS  Left Ventricle: Left ventricular ejection fraction, by estimation, is 60 to  65%. Left ventricular ejection fraction by 2D MOD biplane is 63.5 %. The left ventricle has normal function. The left ventricle has no regional wall motion abnormalities. The left ventricular internal cavity size was normal in size. There is borderline left ventricular hypertrophy. Left ventricular diastolic parameters were normal. Right Ventricle: The right ventricular size is normal. No increase in right ventricular wall thickness. Right ventricular systolic function is normal. Left Atrium: Left atrial size was normal in size. Right Atrium: Right atrial size was normal in size. Pericardium: There is no evidence of pericardial effusion. Mitral Valve: The mitral valve is normal in structure. Trivial mitral valve regurgitation. No evidence of mitral valve stenosis. Tricuspid Valve: The tricuspid valve is normal in structure. Tricuspid valve regurgitation is not demonstrated. No evidence of tricuspid stenosis. Aortic Valve: The aortic valve is tricuspid. Aortic valve regurgitation is not visualized. No aortic stenosis is present. Pulmonic Valve: The pulmonic valve was normal in structure. Pulmonic valve regurgitation is not visualized. No evidence of pulmonic stenosis. Aorta: Aortic dilatation noted and the aortic root is normal in size and structure. There is dilatation of the ascending aorta, measuring 40 mm. Venous: The inferior vena cava is normal in size with less than 50% respiratory variability, suggesting right atrial pressure of 8 mmHg. IAS/Shunts: No atrial level shunt detected by color flow Doppler.  LEFT VENTRICLE PLAX 2D                        Biplane EF (MOD) LVIDd:         4.70 cm         LV Biplane EF:   Left LVIDs:         3.30 cm                          ventricular LV PW:         1.10 cm  ejection LV IVS:        1.10 cm                          fraction by LVOT diam:     2.30 cm                          2D MOD LV SV:         76                               biplane is LV SV  Index:   39                               63.5 %. LVOT Area:     4.15 cm LV IVRT:       95 msec         Diastology                                LV e' medial:    9.14 cm/s                                LV E/e' medial:  8.7 LV Volumes (MOD)               LV e' lateral:   10.00 cm/s LV vol d, MOD    77.3 ml       LV E/e' lateral: 7.9 A2C: LV vol d, MOD    73.5 ml A4C: LV vol s, MOD    37.0 ml A2C: LV vol s, MOD    20.2 ml A4C: LV SV MOD A2C:   40.3 ml LV SV MOD A4C:   73.5 ml LV SV MOD BP:    50.0 ml RIGHT VENTRICLE             IVC RV S prime:     15.90 cm/s  IVC diam: 1.60 cm TAPSE (M-mode): 1.8 cm LEFT ATRIUM             Index        RIGHT ATRIUM           Index LA diam:        3.10 cm 1.58 cm/m   RA Area:     10.10 cm LA Vol (A2C):   27.0 ml 13.79 ml/m  RA Volume:   18.40 ml  9.40 ml/m LA Vol (A4C):   24.3 ml 12.42 ml/m LA Biplane Vol: 25.6 ml 13.08 ml/m  AORTIC VALVE LVOT Vmax:   109.00 cm/s LVOT Vmean:  68.600 cm/s LVOT VTI:    0.182 m  AORTA Ao Root diam: 3.80 cm Ao Asc diam:  3.87 cm MITRAL VALVE MV Area (PHT): 4.49 cm    SHUNTS MV Decel Time: 169 msec    Systemic VTI:  0.18 m MV E velocity: 79.20 cm/s  Systemic Diam: 2.30 cm MV A velocity: 94.70 cm/s MV E/A ratio:  0.84 Franck Azobou Tonleu Electronically signed by Joelle Cedars Tonleu Signature Date/Time: 08/12/2024/2:52:56 PM    Final    CT Head Wo Contrast Result Date: 08/11/2024 EXAM: CT HEAD WITHOUT CONTRAST 08/11/2024 02:55:24 PM TECHNIQUE: CT of the  head was performed without the administration of intravenous contrast. Automated exposure control, iterative reconstruction, and/or weight based adjustment of the mA/kV was utilized to reduce the radiation dose to as low as reasonably achievable. COMPARISON: 05/03/2017 CLINICAL HISTORY: Confusion, unwitnessed fall earlier. FINDINGS: BRAIN AND VENTRICLES: No acute hemorrhage. No evidence of acute infarct. No hydrocephalus. No extra-axial collection. No mass effect or midline shift. ORBITS: No  acute abnormality. SINUSES: No acute abnormality. SOFT TISSUES AND SKULL: No acute soft tissue abnormality. No skull fracture. IMPRESSION: 1. No acute intracranial abnormality. Electronically signed by: Franky Crease MD 08/11/2024 03:39 PM EST RP Workstation: [id number]   CT Angio Chest/Abd/Pel for Dissection W and/or W/WO Result Date: 08/11/2024 EXAM: CTA CHEST, ABDOMEN AND PELVIS WITHOUT AND WITH CONTRAST 08/11/2024 02:55:24 PM TECHNIQUE: CTA of the chest was performed without and with the administration of intravenous contrast. CTA of the abdomen and pelvis was performed without and with the administration of intravenous contrast. 100 mL iohexol  (OMNIPAQUE ) 350 MG/ML injection 100 mL IOHEXOL  350 MG/ML SOLN was administered. Multiplanar reformatted images are provided for review. MIP images are provided for review. Automated exposure control, iterative reconstruction, and/or weight based adjustment of the mA/kV was utilized to reduce the radiation dose to as low as reasonably achievable. COMPARISON: CT abdomen and pelvis 07/30/2024. CT angio chest 07/05/2024. CLINICAL HISTORY: GI bleed. FINDINGS: VASCULATURE: AORTA: Scattered aortic atherosclerosis. No aneurysm or dissection. PULMONARY ARTERIES: No pulmonary embolism with the limits of this exam. GREAT VESSELS OF AORTIC ARCH: No acute finding. No dissection. No arterial occlusion or significant stenosis. CELIAC TRUNK: No acute finding. No occlusion or significant stenosis. SUPERIOR MESENTERIC ARTERY: No acute finding. No occlusion or significant stenosis. INFERIOR MESENTERIC ARTERY: No acute finding. No occlusion or significant stenosis. RENAL ARTERIES: No acute finding. No occlusion or significant stenosis. ILIAC ARTERIES: No acute finding. No occlusion or significant stenosis. CHEST: MEDIASTINUM: Extensive 3 vessel coronary artery disease. No mediastinal lymphadenopathy. The heart and pericardium demonstrate no acute abnormality. LUNGS AND PLEURA: Small  scattered pulmonary nodules, index nodule in the posterior left lower lobe measures 6 mm on image 93 compared to 5 mm previously, not significantly changed. Index posterior right lower lobe nodule on image 87 measures 4 mm, stable. No new or significantly enlarging nodule. No focal consolidation or pulmonary edema. No evidence of pleural effusion or pneumothorax. THORACIC BONES AND SOFT TISSUES: No acute bone or soft tissue abnormality. ABDOMEN AND PELVIS: LIVER: The liver is unremarkable. GALLBLADDER AND BILE DUCTS: Gallbladder is unremarkable. No biliary ductal dilatation. SPLEEN: The spleen is unremarkable. PANCREAS: The pancreas is unremarkable. ADRENAL GLANDS: Bilateral adrenal glands demonstrate no acute abnormality. KIDNEYS, URETERS AND BLADDER: No stones in the kidneys or ureters. No hydronephrosis. No perinephric or periureteral stranding. Urinary bladder is unremarkable. GI AND BOWEL: Lap band noted in place around the proximal stomach. This is unchanged. Stomach and duodenal sweep demonstrate no acute abnormality. There is no bowel obstruction. No abnormal bowel wall thickening or distension. REPRODUCTIVE: Reproductive organs are unremarkable. PERITONEUM AND RETROPERITONEUM: No ascites or free air. LYMPH NODES: No lymphadenopathy. ABDOMINAL BONES AND SOFT TISSUES: No acute abnormality of the bones. No acute soft tissue abnormality. IMPRESSION: 1. No evidence of aortic aneurysm or dissection. 2. No evidence of pulmonary embolism. 3. Extensive coronary artery disease. 4. Small scattered pulmonary nodules, with the largest in the posterior left lower lobe measuring 6 mm and without significant interval change; management per Fleischner Society Guidelines is recommended with follow-up chest CT in 1 year. Electronically signed by: Franky Crease  MD 08/11/2024 03:38 PM EST RP Workstation: [id number]   CT ABDOMEN PELVIS WO CONTRAST Result Date: 07/30/2024 CLINICAL DATA:  Upper abdominal pain.  Status post lap  band. EXAM: CT ABDOMEN AND PELVIS WITHOUT CONTRAST TECHNIQUE: Multidetector CT imaging of the abdomen and pelvis was performed following the standard protocol without IV contrast. RADIATION DOSE REDUCTION: This exam was performed according to the departmental dose-optimization program which includes automated exposure control, adjustment of the mA and/or kV according to patient size and/or use of iterative reconstruction technique. COMPARISON:  CT abdomen/pelvis dated 07/26/2024. FINDINGS: Lower chest: Redemonstrated 6 mm left lower lobe subpleural nodule (series 4, image 23) and 5 mm right lower lobe subpleural nodule (series 4, image 17). As reported previously, According to the Fleischner Society pulmonary nodule recommendations, the finding is consistent with a solitary solid nodule 6-8 mm and the recommendation is: for low-risk patients, CT at 6-12 months, then consider CT at 18-24 months; for high-risk patients, CT at 6-12 months, then CT at 18-24 months. Hepatobiliary: No focal liver abnormality is seen. No gallstones, gallbladder wall thickening, or biliary dilatation. Pancreas: Unremarkable. No pancreatic ductal dilatation or surrounding inflammatory changes. Spleen: Normal in size.  Scattered punctate calcified granulomas. Adrenals/Urinary Tract: Adrenal glands are unremarkable. No urolithiasis or hydronephrosis. Bladder is unremarkable. Stomach/Bowel: Stable gastric lap band apparatus. No obstruction or inflammatory changes. Vascular/Lymphatic: Aortic atherosclerosis. Redemonstrated prominent central mesenteric lymph nodes, not definitively enlarged by size criteria. Reproductive: Prostate is unremarkable. Other: No abdominopelvic ascites.  No intraperitoneal free air. Musculoskeletal: No acute osseous abnormality. No suspicious osseous lesion. Redemonstrated neurostimulator generator pack in the posterior right flank with leads extending into the epidural space of the midthoracic spine. Instrumented  fusion at L5-S1. Degenerative disc changes at L4-L5. IMPRESSION: 1. No acute localizing findings in the abdomen or pelvis. 2.  Aortic Atherosclerosis (ICD10-I70.0). Electronically Signed   By: Harrietta Sherry M.D.   On: 07/30/2024 10:47   DG Chest 2 View Result Date: 07/30/2024 CLINICAL DATA:  Shortness of breath EXAM: CHEST - 2 VIEW COMPARISON:  CTA chest dated 07/07/2024 FINDINGS: Mildly low lung volumes. No focal consolidations. No pleural effusion or pneumothorax. The heart size and mediastinal contours are within normal limits. No acute osseous abnormality. Partially imaged intrathecal leads terminate at the level of T6-7. Partially imaged gastric band with phi angle measuring 60 degrees. IMPRESSION: 1. Mildly low lung volumes. No focal consolidations. 2. Slightly increased phi angle of the gastric band, suggestive of malposition. Electronically Signed   By: Limin  Xu M.D.   On: 07/30/2024 10:20   CT ABDOMEN PELVIS W CONTRAST Result Date: 07/26/2024 EXAM: CT ABDOMEN AND PELVIS WITH CONTRAST 07/26/2024 07:03:01 PM TECHNIQUE: CT of the abdomen and pelvis was performed with the administration of 100 mL iohexol  (OMNIPAQUE ) 300 MG/ML solution. Multiplanar reformatted images are provided for review. Automated exposure control, iterative reconstruction, and/or weight-based adjustment of the mA/kV was utilized to reduce the radiation dose to as low as reasonably achievable. COMPARISON: 06/30/2019 CLINICAL HISTORY: abd pain \\T \ bloody stools, s/p lap band FINDINGS: LOWER CHEST: Scattered coronary calcifications. 6 mm pleural based nodule, lateral left lower lobe, previously 4 mm. 5 mm subpleural nodule, posterior right lower lobe (5:26) more conspicuous than on prior exam. According to the Fleischner Society pulmonary nodule recommendations, the finding is consistent with a solitary solid nodule 6-8 mm and the recommendation is: for low-risk patients, CT at 6-12 months, then consider CT at 18-24 months; for  high-risk patients, CT at 6-12 months, then CT at 18-24  months. LIVER: The liver is unremarkable. GALLBLADDER AND BILE DUCTS: Gallbladder is unremarkable. No biliary ductal dilatation. SPLEEN: Scattered punctate calcified granulomas in the spleen, normal in size. PANCREAS: No acute abnormality. ADRENAL GLANDS: No acute abnormality. KIDNEYS, URETERS AND BLADDER: No stones in the kidneys or ureters. No hydronephrosis. No perinephric or periureteral stranding. Urinary bladder is unremarkable. Mild prostate enlargement. GI AND BOWEL: Stable gastric lap band apparatus. Stomach demonstrates no acute abnormality. There is no bowel obstruction. PERITONEUM AND RETROPERITONEUM: No ascites. No free air. VASCULATURE: Aorta is normal in caliber. Moderate scattered calcified aortoiliac plaque without aneurysm. LYMPH NODES: Prominent central mesenteric lymph nodes, none greater than 1 cm short axis diameter. REPRODUCTIVE ORGANS: No acute abnormality. BONES AND SOFT TISSUES: Implantable stimulator device in the posterior right flank with catheters extending into the epidural space of the mid thoracic spine as before. Stable instrumented fusion L5-S1. Mild degenerative disc disease L4-L5 with vacuum phenomenon progressive since prior study. No acute osseous abnormality. IMPRESSION: 1. Stable gastric lap band apparatus. 2. No acute findings in the abdomen or pelvis. 3. Progressive adjacent level degenerative disc disease L4 L5. Electronically signed by: Katheleen Faes MD 07/26/2024 07:18 PM EST RP Workstation: [id number]    Microbiology: Results for orders placed or performed in visit on 03/07/21  Fecal occult blood, imunochemical([facility])     Status: None   Collection Time: 05/08/21 12:30 PM   Specimen: Stool   ST  Result Value Ref Range Status   Fecal Occult Bld Negative Negative Final    Labs: CBC: Recent Labs  Lab 08/11/24 1313 08/12/24 0343 08/12/24 2110 08/13/24 0401 08/14/24 0349  WBC 6.7 4.8 10.5  7.3 8.6  NEUTROABS 3.9  --   --   --   --   HGB 12.5* 10.8* 11.2* 11.2* 11.1*  HCT 38.7* 35.3* 35.9* 36.9* 36.5*  MCV 78.5* 81.7 80.9 82.2 82.8  PLT 342 282 327 248 306   Basic Metabolic Panel: Recent Labs  Lab 08/11/24 1313 08/12/24 0343 08/13/24 0401 08/14/24 0349  NA 135 140 139 142  K 4.1 4.4 4.1 4.1  CL 98 105 102 103  CO2 21* 24 21* 25  GLUCOSE 117* 86 144* 85  BUN 10 8 9 9   CREATININE 1.11 1.15 1.07 0.96  CALCIUM  9.7 9.3 9.2 9.4  MG  --   --  2.0  --    Liver Function Tests: Recent Labs  Lab 08/11/24 1313  AST 28  ALT 28  ALKPHOS 92  BILITOT 0.5  PROT 8.1  ALBUMIN 4.9   CBG: Recent Labs  Lab 08/13/24 0757 08/13/24 1117 08/13/24 2018 08/14/24 0802 08/14/24 1323  GLUCAP 112* 106* 223* 119* 99    Discharge time spent: greater than 30 minutes.  Signed: Garrit Marrow, DO [facility] 08/16/2024

## 2024-08-16 NOTE — ED Notes (Signed)
 Pt standing at station and states I need to leave, I've been here all day, and need to go home. Pt was informed that discharge paperwork was not ready.   Pt refused to wait for discharge paperwork and walked out of ER.

## 2024-08-16 NOTE — ED Triage Notes (Signed)
 Pt came in for arm pain after getting an IV placed Tuesday and taken out Friday. Pt stated it's getting more sore, it's hot to touch, and its red on his forearm.

## 2024-08-16 NOTE — Discharge Summary (Deleted)
   The note originally documented on this encounter has been moved the the encounter in which it belongs.

## 2024-08-16 NOTE — ED Provider Notes (Signed)
 Physical Exam  BP 129/86 (BP Location: Left Arm)   Pulse 74   Temp 98.5 F (36.9 C) (Oral)   Resp 16   SpO2 100%   Physical Exam Vitals and nursing note reviewed.  Constitutional:      General: He is not in acute distress.    Appearance: Normal appearance. He is not ill-appearing, toxic-appearing or diaphoretic.  HENT:     Head: Normocephalic and atraumatic.  Eyes:     General: No scleral icterus.    Extraocular Movements: Extraocular movements intact.     Conjunctiva/sclera: Conjunctivae normal.  Pulmonary:     Effort: Pulmonary effort is normal. No respiratory distress.  Musculoskeletal:        General: Normal range of motion.  Skin:    General: Skin is warm and dry.     Coloration: Skin is not jaundiced or pale.  Neurological:     Mental Status: He is alert and oriented to person, place, and time.     Procedures  Procedures  ED Course / MDM   Clinical Course as of 08/16/24 1510  Sun Aug 16, 2024  1135 Basic labs show no acute abnormality.  No evidence of leukocytosis, so less likely to be cellulitis or erysipelas.  Vascular ultrasound is pending at this time. [GD]  1424 Upon reassessment, ultrasound has not yet been completed.  Patient is stable.  Patient will likely need handoff to next provider.  I anticipate treating patient's symptoms and then likely discharge pending ultrasound results. [GD]  1439 At this time, I spoke with the patient he said he is having worsening pain in his right arm.  I did take a picture of what his arm looked like when he first arrived, and took a new picture now.  There does appear to be more erythema that is spreading up the track of the vein.  I will give Toradol  to the patient for pain control as well as apply a warm compress.  I am still waiting on ultrasound for further evaluation to determine the depth of suspected thrombophlebitis. [GD]    Clinical Course User Index [GD] Torrence Marry RAMAN, PA-C   Medical Decision Making Amount  and/or Complexity of Data Reviewed Labs: ordered.  Risk Prescription drug management.   Signout from [place] DuFour PA-C at shift change. Briefly, patient presents for right arm pain. Patient has a history of diabetes, hypertension, hyperlipidemia, peripheral vascular disease, recent hospital admission.   Plan: Ultrasound results pending.   3:10 PM Reassessment performed. Patient appears stable and in no acute distress.  Of note the patient did leave AMA prior to final ultrasound results being released.  At the time that he left only preliminary results released.  He was encouraged to stay however he did not want to stating he had been here all day already.  Labs and imaging personally reviewed and interpreted including: No leukocytosis or other abnormalities seen on basic labs.   Reviewed additional pertinent lab work and imaging with patient at bedside including: Patient eloped prior to final ultrasound result releasing.   Most current vital signs reviewed and are as follows: BP 129/86 (BP Location: Left Arm)   Pulse 74   Temp 98.5 F (36.9 C) (Oral)   Resp 16   SpO2 100%   Patient eloped AMA.  11:51 PM No acute findings on ultrasound results.  Final ultrasound results showed no evidence of DVT in the right upper extremity as well as findings consistent with acute superficial vein thrombosis involving  the right cephalic vein.  No evidence of thrombosis in the left subclavian.  This note was produced using [employer]. While the provider has reviewed and verified all clinical information, transcription errors may remain.         Rosina Almarie LABOR, PA-C 08/16/24 2352    Charlyn Sora, MD 08/17/24 [phone]

## 2024-08-16 NOTE — ED Provider Notes (Signed)
 [place] EMERGENCY DEPARTMENT AT [facility] Provider Note   CSN: [id number] Arrival date & time: 08/16/24  9179     Patient presents with: Arm Pain   Kevin [name] is a 58 y.o. male with past medical history of diabetes, hypertension, hyperlipidemia, peripheral vascular disease, recent hospital admission, who presents emergency department for evaluation of right arm pain.  Patient was admitted to the hospital from 12/2 until 12/5 and had a peripheral IV placed in his right forearm.  This morning he noticed redness and pain around the IV site that extends up his arm toward the distal upper arm.  He reports pain with bending his elbow as well as bending and flexing his wrist.  He denies any systemic symptoms including fever, chills, body aches.    Arm Pain       Prior to Admission medications   Medication Sig Start Date End Date Taking? Authorizing Provider  amLODipine  (NORVASC ) 10 MG tablet Take 1 tablet (10 mg total) by mouth daily. 05/22/24   Fleming, Zelda W, NP  ascorbic acid (VITAMIN C) 500 MG tablet Take 500-1,000 mg by mouth daily.    [provider]  atorvastatin  (LIPITOR) 40 MG tablet Take 1 tablet (40 mg total) by mouth daily. 05/22/24   Fleming, Zelda W, NP  Cyanocobalamin (VITAMIN B12 PO) Take 1 tablet by mouth daily.    [provider]  folic acid (FOLVITE) 800 MCG tablet Take 800 mcg by mouth daily.    [provider]  glimepiride  (AMARYL ) 4 MG tablet Take 1 tablet (4 mg total) by mouth daily with breakfast. 05/22/24   Fleming, Zelda W, NP  glucose blood (TRUE METRIX BLOOD GLUCOSE TEST) test strip Use as instructed. Check blood glucose level by fingerstick twice per day. 11/19/23   Fleming, Zelda W, NP  JANUMET  50-1000 MG tablet Take 1 tablet by mouth 2 (two) times daily with a meal. Patient taking differently: Take 1 tablet by mouth See admin instructions. Take 20 mg by mouth with meals in the morning and evening 05/22/24   Fleming, Zelda  W, NP  Multiple Vitamin (MULTIVITAMIN WITH MINERALS) TABS tablet Take 1 tablet by mouth daily. 08/15/24   Swayze, Ava, DO  omeprazole  (PRILOSEC) 20 MG capsule Take 1 capsule (20 mg total) by mouth 2 (two) times daily before a meal. Patient taking differently: Take 20 mg by mouth See admin instructions. Take 20 mg by mouth 30 minutes before meals in the morning and evening 02/18/24   Fleming, Zelda W, NP  TRUEplus Lancets 28G MISC Use as instructed. Check blood glucose level by fingerstick twice per day. 11/19/23   Fleming, Zelda W, NP  valsartan  (DIOVAN ) 160 MG tablet Take 1 tablet (160 mg total) by mouth daily. 05/22/24   Theotis Haze ORN, NP    Allergies: Nsaids and Tramadol     Review of Systems  Skin:  Positive for color change and rash.    Updated Vital Signs BP 129/86 (BP Location: Left Arm)   Pulse 74   Temp 98.5 F (36.9 C) (Oral)   Resp 16   SpO2 100%   Physical Exam Vitals and nursing note reviewed.  Constitutional:      Appearance: Normal appearance. He is not ill-appearing.  Eyes:     General: No scleral icterus. Pulmonary:     Effort: Pulmonary effort is normal. No respiratory distress.  Musculoskeletal:        General: Swelling and tenderness present. No deformity.     Comments:  Right upper extremity with erythema noted to forearm where prior IV was placed.  Erythema extends up to the area of the elbow.  It is warm, tender and indurated.  Radial pulse intact.  Please see photo below.  Skin:    Coloration: Skin is not jaundiced.  Neurological:     General: No focal deficit present.     Mental Status: He is alert.  Psychiatric:        Mood and Affect: Mood normal.           (all labs ordered are listed, but only abnormal results are displayed) Labs Reviewed  CBC WITH DIFFERENTIAL/PLATELET - Abnormal; Notable for the following components:      Result Value   Hemoglobin 11.9 (*)    HCT 38.6 (*)    MCH 25.2 (*)    RDW 19.0 (*)    All other components  within normal limits  BASIC METABOLIC PANEL WITH GFR - Abnormal; Notable for the following components:   Glucose, Bld 155 (*)    All other components within normal limits    EKG: None  Radiology: No results found.  Procedures   Medications Ordered in the ED  ketorolac  (TORADOL ) 15 MG/ML injection 15 mg (has no administration in time range)   Clinical Course as of 08/16/24 1441  Sun Aug 16, 2024  1135 Basic labs show no acute abnormality.  No evidence of leukocytosis, so less likely to be cellulitis or erysipelas.  Vascular ultrasound is pending at this time. [GD]  1424 Upon reassessment, ultrasound has not yet been completed.  Patient is stable.  Patient will likely need handoff to next provider.  I anticipate treating patient's symptoms and then likely discharge pending ultrasound results. [GD]  1439 At this time, I spoke with the patient he said he is having worsening pain in his right arm.  I did take a picture of what his arm looked like when he first arrived, and took a new picture now.  There does appear to be more erythema that is spreading up the track of the vein.  I will give Toradol  to the patient for pain control as well as apply a warm compress.  I am still waiting on ultrasound for further evaluation to determine the depth of suspected thrombophlebitis. [GD]    Clinical Course User Index [GD] Torrence Marry RAMAN, PA-C                                 Medical Decision Making Amount and/or Complexity of Data Reviewed Labs: ordered.  Risk Prescription drug management.   58 year old male who presents emergency department for evaluation of right arm pain.  Differential diagnoses: Thrombophlebitis, cellulitis, erysipelas, DVT, phlebitis  Please see clinical course above.  Final diagnoses:  Thrombophlebitis    ED Discharge Orders     None          Torrence Marry RAMAN DEVONNA 08/16/24 1441    Charlyn Sora, MD 08/17/24 9178    Charlyn Sora,  MD 08/17/24 [phone]

## 2024-08-16 NOTE — Progress Notes (Signed)
 Right upper ext venous  has been completed. Refer to [facility] under chart review to view preliminary results.   08/16/2024  4:57 PM Kimberlyann Hollar, Ricka BIRCH

## 2024-08-16 NOTE — Discharge Summary (Incomplete)
 Physician Discharge Summary   Patient: Kevin [name] MRN: [id number] DOB: 09/19/1965  Admit date:     08/11/2024  Discharge date: 08/14/2024  Discharge Physician: Brigida Bureau   PCP: Theotis Haze ORN, NP   Recommendations at discharge:  {Tip this will not be part of the note when signed- Example include specific recommendations for outpatient follow-up, pending tests to follow-up on. (Optional):26781}  Discharge to home. Follow up with PCP in 7-10 days. Have hemoglobin checked at that time.  Follow up with GI as arranged for by GI  Discharge Diagnoses: Principal Problem:   Upper GI bleed Active Problems:   Dark stools   Change in bowel habits   Chronic bilateral lower abdominal pain   Malnutrition of moderate degree   Melena   Gastritis without bleeding   AVM (arteriovenous malformation) of small bowel, acquired   History of colonic polyps  Resolved Problems:   * No resolved hospital problems. *  Hospital Course: 58 year old with history of melena, chronic abdominal pain, anxiety, non-insulin -dependent DM 2, HTN admitted to the hospital for progressive weight loss, weakness and syncope.  He has dealt with episodes of melena over the last decade secondary to abnormal polyps.  He follows outpatient I will be GI and was scheduled for colonoscopy on 12/4 but due to syncopal event he ended up coming to the hospital. Patient seen by GI with plans for endoscopic evaluation both EGD and colonoscopy.EGD with Dr. Rondell on 08/14/2024. It demonstrated : - No gross lesions in the entire esophagus. Z-line irregular, 36 cm from the incisors.  - 1 cm hiatal hernia.  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  - Erythematous mucosa in the antrum. No other gross lesions in the entire stomach. Biopsied.  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 Treated with argon plasma coagulation (APC).  - No other gross lesions in the duodenal bulb, in the first portion of the duodenum, in the  second portion of the duodenum and in the third portion of the duodenum. Biopsied.  - Normal major papilla. - Started Pantoprazole  40 mg p.o. daily  Colonoscopy was also performed on 08/13/2024. It demonstrated    Expand All Collapse All         [facility] Progress Note   CC:  Anemia, dark stool    Subjective: Patient stated he feels remarkably better today. He stated his chronic lower abdominal pain completed abated following his upper endoscopy and colonoscopy yesterday. He also feels his urination has improved, able to go longer periods of time without urinating which is a significant relief for him. He wishes to go home. No CP or SOB.    Objective:    EGD 08/13/2024: - No gross lesions in the entire esophagus. Z-line irregular, 36 cm from the incisors.  - 1 cm hiatal hernia.  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  - Erythematous mucosa in the antrum. No other gross lesions in the entire stomach. Biopsied.  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 Treated with argon plasma coagulation (APC).  - No other gross lesions in the duodenal bulb, in the first portion of the duodenum, in the second portion of the duodenum and in the third portion of the duodenum. Biopsied.  - Normal major papilla. - Started Pantoprazole  40 mg p.o. daily   Colonoscopy 08/13/2024: - Hemorrhoids found on digital rectal exam.  - The examined portion of the ileum was normal.  - Five 2  to 6 mm polyps at the recto-sigmoid colon, in the transverse colon, at the hepatic flexure and in the cecum, removed with a cold snare. Resected and retrieved.  - Normal mucosa in the entire examined colon otherwise. Biopsied.  - Non-bleeding non-thrombosed internal hemorrhoids      The patient was also evaluated by nutrition who determined that he had  On 08/15/2024 the patient is feeling well. He is discharged to home in fair condition.  Assessment and Plan:    {(NOTE) Pain control  PDMP Statment (Optional):26782} Consultants: *** Procedures performed: ***  Disposition: {Plan; Disposition:26390} Diet recommendation:  Discharge Diet Orders (From admission, onward)     Start     Ordered   08/14/24 0000  Diet - low sodium heart healthy        08/14/24 1646           {Diet_Plan:26776} DISCHARGE MEDICATION: Allergies as of 08/14/2024       Reactions   Nsaids Other (See Comments)   Lap band   Tramadol  Other (See Comments)   Seizures        Medication List     STOP taking these medications    ferrous sulfate  325 (65 FE) MG tablet   tadalafil  10 MG tablet Commonly known as: CIALIS        TAKE these medications    amLODipine  10 MG tablet Commonly known as: NORVASC  Take 1 tablet (10 mg total) by mouth daily.   ascorbic acid 500 MG tablet Commonly known as: VITAMIN C Take 500-1,000 mg by mouth daily.   atorvastatin  40 MG tablet Commonly known as: LIPITOR Take 1 tablet (40 mg total) by mouth daily.   folic acid 800 MCG tablet Commonly known as: FOLVITE Take 800 mcg by mouth daily.   glimepiride  4 MG tablet Commonly known as: AMARYL  Take 1 tablet (4 mg total) by mouth daily with breakfast.   Janumet  50-1000 MG tablet Generic drug: sitaGLIPtin -metformin  Take 1 tablet by mouth 2 (two) times daily with a meal. What changed:  when to take this additional instructions   multivitamin with minerals Tabs tablet Take 1 tablet by mouth daily.   omeprazole  20 MG capsule Commonly known as: PRILOSEC Take 1 capsule (20 mg total) by mouth 2 (two) times daily before a meal. What changed:  when to take this additional instructions   True Metrix Blood Glucose Test test strip Generic drug: glucose blood Use as instructed. Check blood glucose level by fingerstick twice per day.   TRUEplus Lancets 28G Misc Use as instructed. Check blood glucose level by fingerstick twice per day.   valsartan  160 MG tablet Commonly known as: DIOVAN  Take 1  tablet (160 mg total) by mouth daily.   VITAMIN B12 PO Take 1 tablet by mouth daily.        Discharge Exam: Fredricka Weights   08/11/24 2009 08/13/24 1436  Weight: 84 kg 84 kg   ***  Condition at discharge: {DC Condition:26389}  The results of significant diagnostics from this hospitalization (including imaging, microbiology, ancillary and laboratory) are listed below for reference.   Imaging Studies: CT HEAD WO CONTRAST ( ) Result Date: 08/12/2024 EXAM: CT HEAD WITHOUT CONTRAST 08/12/2024 08:32:00 PM TECHNIQUE: CT of the head was performed without the administration of intravenous contrast. Automated exposure control, iterative reconstruction, and/or weight based adjustment of the mA/kV was utilized to reduce the radiation dose to as low as reasonably achievable. COMPARISON: 08/11/2024 CLINICAL HISTORY: Syncope/presyncope, cerebrovascular cause suspected FINDINGS: BRAIN AND VENTRICLES: No acute hemorrhage.  No evidence of acute infarct. No hydrocephalus. No extra-axial collection. No mass effect or midline shift. ORBITS: No acute abnormality. SINUSES: No acute abnormality. SOFT TISSUES AND SKULL: No acute soft tissue abnormality. No skull fracture. IMPRESSION: 1. No acute intracranial abnormality. Normal CT of the head for age Electronically signed by: Lonni Necessary MD 08/12/2024 09:14 PM EST RP Workstation: [id number]   ECHOCARDIOGRAM COMPLETE Result Date: 08/12/2024    ECHOCARDIOGRAM REPORT   Patient Name:   VICTOR LANGENBACH Date of Exam: 08/12/2024 Medical Rec #:  [id number]   Height:       67.0 in Accession #:    [id number]  Weight:       185.2 lb Date of Birth:  1966/07/03   BSA:          1.957 m Patient Age:    58 years    BP:           135/84 mmHg Patient Gender: M           HR:           86 bpm. Exam Location:  Inpatient Procedure: 2D Echo, Cardiac Doppler and Color Doppler (Both Spectral and Color            Flow Doppler were utilized during procedure). Indications:    I42.9  Cardiomyopathy (unspecified); R55 Syncope  History:        Patient has prior history of Echocardiogram examinations, most                 recent 03/26/2018. Signs/Symptoms:Chest Pain; Risk                 Factors:Hypertension, Diabetes and Former Smoker.  Sonographer:    Ellouise Mose RDCS Referring Phys: [id number] BURGESS JAYSON DARE  Sonographer Comments: Technically difficult study due to poor echo windows. IMPRESSIONS  1. Left ventricular ejection fraction, by estimation, is 60 to 65%. Left ventricular ejection fraction by 2D MOD biplane is 63.5 %. The left ventricle has normal function. The left ventricle has no regional wall motion abnormalities. Left ventricular diastolic parameters were normal.  2. Right ventricular systolic function is normal. The right ventricular size is normal.  3. The mitral valve is normal in structure. Trivial mitral valve regurgitation. No evidence of mitral stenosis.  4. The aortic valve is tricuspid. Aortic valve regurgitation is not visualized. No aortic stenosis is present.  5. Aortic dilatation noted. There is dilatation of the ascending aorta, measuring 40 mm.  6. The inferior vena cava is normal in size with <50% respiratory variability, suggesting right atrial pressure of 8 mmHg. Comparison(s): Changes from prior study are noted. FINDINGS  Left Ventricle: Left ventricular ejection fraction, by estimation, is 60 to 65%. Left ventricular ejection fraction by 2D MOD biplane is 63.5 %. The left ventricle has normal function. The left ventricle has no regional wall motion abnormalities. The left ventricular internal cavity size was normal in size. There is borderline left ventricular hypertrophy. Left ventricular diastolic parameters were normal. Right Ventricle: The right ventricular size is normal. No increase in right ventricular wall thickness. Right ventricular systolic function is normal. Left Atrium: Left atrial size was normal in size. Right Atrium: Right atrial size was normal in size.  Pericardium: There is no evidence of pericardial effusion. Mitral Valve: The mitral valve is normal in structure. Trivial mitral valve regurgitation. No evidence of mitral valve stenosis. Tricuspid Valve: The tricuspid valve is normal in structure. Tricuspid valve regurgitation is not demonstrated. No evidence of tricuspid stenosis.  Aortic Valve: The aortic valve is tricuspid. Aortic valve regurgitation is not visualized. No aortic stenosis is present. Pulmonic Valve: The pulmonic valve was normal in structure. Pulmonic valve regurgitation is not visualized. No evidence of pulmonic stenosis. Aorta: Aortic dilatation noted and the aortic root is normal in size and structure. There is dilatation of the ascending aorta, measuring 40 mm. Venous: The inferior vena cava is normal in size with less than 50% respiratory variability, suggesting right atrial pressure of 8 mmHg. IAS/Shunts: No atrial level shunt detected by color flow Doppler.  LEFT VENTRICLE PLAX 2D                        Biplane EF (MOD) LVIDd:         4.70 cm         LV Biplane EF:   Left LVIDs:         3.30 cm                          ventricular LV PW:         1.10 cm                          ejection LV IVS:        1.10 cm                          fraction by LVOT diam:     2.30 cm                          2D MOD LV SV:         76                               biplane is LV SV Index:   39                               63.5 %. LVOT Area:     4.15 cm LV IVRT:       95 msec         Diastology                                LV e' medial:    9.14 cm/s                                LV E/e' medial:  8.7 LV Volumes (MOD)               LV e' lateral:   10.00 cm/s LV vol d, MOD    77.3 ml       LV E/e' lateral: 7.9 A2C: LV vol d, MOD    73.5 ml A4C: LV vol s, MOD    37.0 ml A2C: LV vol s, MOD    20.2 ml A4C: LV SV MOD A2C:   40.3 ml LV SV MOD A4C:   73.5 ml LV SV MOD BP:    50.0 ml RIGHT VENTRICLE             IVC RV S prime:  15.90 cm/s  IVC diam: 1.60 cm  TAPSE (M-mode): 1.8 cm LEFT ATRIUM             Index        RIGHT ATRIUM           Index LA diam:        3.10 cm 1.58 cm/m   RA Area:     10.10 cm LA Vol (A2C):   27.0 ml 13.79 ml/m  RA Volume:   18.40 ml  9.40 ml/m LA Vol (A4C):   24.3 ml 12.42 ml/m LA Biplane Vol: 25.6 ml 13.08 ml/m  AORTIC VALVE LVOT Vmax:   109.00 cm/s LVOT Vmean:  68.600 cm/s LVOT VTI:    0.182 m  AORTA Ao Root diam: 3.80 cm Ao Asc diam:  3.87 cm MITRAL VALVE MV Area (PHT): 4.49 cm    SHUNTS MV Decel Time: 169 msec    Systemic VTI:  0.18 m MV E velocity: 79.20 cm/s  Systemic Diam: 2.30 cm MV A velocity: 94.70 cm/s MV E/A ratio:  0.84 Franck Azobou Tonleu Electronically signed by Joelle Cedars Tonleu Signature Date/Time: 08/12/2024/2:52:56 PM    Final    CT Head Wo Contrast Result Date: 08/11/2024 EXAM: CT HEAD WITHOUT CONTRAST 08/11/2024 02:55:24 PM TECHNIQUE: CT of the head was performed without the administration of intravenous contrast. Automated exposure control, iterative reconstruction, and/or weight based adjustment of the mA/kV was utilized to reduce the radiation dose to as low as reasonably achievable. COMPARISON: 05/03/2017 CLINICAL HISTORY: Confusion, unwitnessed fall earlier. FINDINGS: BRAIN AND VENTRICLES: No acute hemorrhage. No evidence of acute infarct. No hydrocephalus. No extra-axial collection. No mass effect or midline shift. ORBITS: No acute abnormality. SINUSES: No acute abnormality. SOFT TISSUES AND SKULL: No acute soft tissue abnormality. No skull fracture. IMPRESSION: 1. No acute intracranial abnormality. Electronically signed by: Franky Crease MD 08/11/2024 03:39 PM EST RP Workstation: [id number]   CT Angio Chest/Abd/Pel for Dissection W and/or W/WO Result Date: 08/11/2024 EXAM: CTA CHEST, ABDOMEN AND PELVIS WITHOUT AND WITH CONTRAST 08/11/2024 02:55:24 PM TECHNIQUE: CTA of the chest was performed without and with the administration of intravenous contrast. CTA of the abdomen and pelvis was performed without  and with the administration of intravenous contrast. 100 mL iohexol  (OMNIPAQUE ) 350 MG/ML injection 100 mL IOHEXOL  350 MG/ML SOLN was administered. Multiplanar reformatted images are provided for review. MIP images are provided for review. Automated exposure control, iterative reconstruction, and/or weight based adjustment of the mA/kV was utilized to reduce the radiation dose to as low as reasonably achievable. COMPARISON: CT abdomen and pelvis 07/30/2024. CT angio chest 07/05/2024. CLINICAL HISTORY: GI bleed. FINDINGS: VASCULATURE: AORTA: Scattered aortic atherosclerosis. No aneurysm or dissection. PULMONARY ARTERIES: No pulmonary embolism with the limits of this exam. GREAT VESSELS OF AORTIC ARCH: No acute finding. No dissection. No arterial occlusion or significant stenosis. CELIAC TRUNK: No acute finding. No occlusion or significant stenosis. SUPERIOR MESENTERIC ARTERY: No acute finding. No occlusion or significant stenosis. INFERIOR MESENTERIC ARTERY: No acute finding. No occlusion or significant stenosis. RENAL ARTERIES: No acute finding. No occlusion or significant stenosis. ILIAC ARTERIES: No acute finding. No occlusion or significant stenosis. CHEST: MEDIASTINUM: Extensive 3 vessel coronary artery disease. No mediastinal lymphadenopathy. The heart and pericardium demonstrate no acute abnormality. LUNGS AND PLEURA: Small scattered pulmonary nodules, index nodule in the posterior left lower lobe measures 6 mm on image 93 compared to 5 mm previously, not significantly changed. Index posterior right lower lobe nodule on image 87 measures 4 mm,  stable. No new or significantly enlarging nodule. No focal consolidation or pulmonary edema. No evidence of pleural effusion or pneumothorax. THORACIC BONES AND SOFT TISSUES: No acute bone or soft tissue abnormality. ABDOMEN AND PELVIS: LIVER: The liver is unremarkable. GALLBLADDER AND BILE DUCTS: Gallbladder is unremarkable. No biliary ductal dilatation. SPLEEN: The  spleen is unremarkable. PANCREAS: The pancreas is unremarkable. ADRENAL GLANDS: Bilateral adrenal glands demonstrate no acute abnormality. KIDNEYS, URETERS AND BLADDER: No stones in the kidneys or ureters. No hydronephrosis. No perinephric or periureteral stranding. Urinary bladder is unremarkable. GI AND BOWEL: Lap band noted in place around the proximal stomach. This is unchanged. Stomach and duodenal sweep demonstrate no acute abnormality. There is no bowel obstruction. No abnormal bowel wall thickening or distension. REPRODUCTIVE: Reproductive organs are unremarkable. PERITONEUM AND RETROPERITONEUM: No ascites or free air. LYMPH NODES: No lymphadenopathy. ABDOMINAL BONES AND SOFT TISSUES: No acute abnormality of the bones. No acute soft tissue abnormality. IMPRESSION: 1. No evidence of aortic aneurysm or dissection. 2. No evidence of pulmonary embolism. 3. Extensive coronary artery disease. 4. Small scattered pulmonary nodules, with the largest in the posterior left lower lobe measuring 6 mm and without significant interval change; management per Fleischner Society Guidelines is recommended with follow-up chest CT in 1 year. Electronically signed by: Franky Crease MD 08/11/2024 03:38 PM EST RP Workstation: [id number]   CT ABDOMEN PELVIS WO CONTRAST Result Date: 07/30/2024 CLINICAL DATA:  Upper abdominal pain.  Status post lap band. EXAM: CT ABDOMEN AND PELVIS WITHOUT CONTRAST TECHNIQUE: Multidetector CT imaging of the abdomen and pelvis was performed following the standard protocol without IV contrast. RADIATION DOSE REDUCTION: This exam was performed according to the departmental dose-optimization program which includes automated exposure control, adjustment of the mA and/or kV according to patient size and/or use of iterative reconstruction technique. COMPARISON:  CT abdomen/pelvis dated 07/26/2024. FINDINGS: Lower chest: Redemonstrated 6 mm left lower lobe subpleural nodule (series 4, image 23) and 5 mm  right lower lobe subpleural nodule (series 4, image 17). As reported previously, According to the Fleischner Society pulmonary nodule recommendations, the finding is consistent with a solitary solid nodule 6-8 mm and the recommendation is: for low-risk patients, CT at 6-12 months, then consider CT at 18-24 months; for high-risk patients, CT at 6-12 months, then CT at 18-24 months. Hepatobiliary: No focal liver abnormality is seen. No gallstones, gallbladder wall thickening, or biliary dilatation. Pancreas: Unremarkable. No pancreatic ductal dilatation or surrounding inflammatory changes. Spleen: Normal in size.  Scattered punctate calcified granulomas. Adrenals/Urinary Tract: Adrenal glands are unremarkable. No urolithiasis or hydronephrosis. Bladder is unremarkable. Stomach/Bowel: Stable gastric lap band apparatus. No obstruction or inflammatory changes. Vascular/Lymphatic: Aortic atherosclerosis. Redemonstrated prominent central mesenteric lymph nodes, not definitively enlarged by size criteria. Reproductive: Prostate is unremarkable. Other: No abdominopelvic ascites.  No intraperitoneal free air. Musculoskeletal: No acute osseous abnormality. No suspicious osseous lesion. Redemonstrated neurostimulator generator pack in the posterior right flank with leads extending into the epidural space of the midthoracic spine. Instrumented fusion at L5-S1. Degenerative disc changes at L4-L5. IMPRESSION: 1. No acute localizing findings in the abdomen or pelvis. 2.  Aortic Atherosclerosis (ICD10-I70.0). Electronically Signed   By: Harrietta Sherry M.D.   On: 07/30/2024 10:47   DG Chest 2 View Result Date: 07/30/2024 CLINICAL DATA:  Shortness of breath EXAM: CHEST - 2 VIEW COMPARISON:  CTA chest dated 07/07/2024 FINDINGS: Mildly low lung volumes. No focal consolidations. No pleural effusion or pneumothorax. The heart size and mediastinal contours are within normal limits. No acute  osseous abnormality. Partially imaged  intrathecal leads terminate at the level of T6-7. Partially imaged gastric band with phi angle measuring 60 degrees. IMPRESSION: 1. Mildly low lung volumes. No focal consolidations. 2. Slightly increased phi angle of the gastric band, suggestive of malposition. Electronically Signed   By: Limin  Xu M.D.   On: 07/30/2024 10:20   CT ABDOMEN PELVIS W CONTRAST Result Date: 07/26/2024 EXAM: CT ABDOMEN AND PELVIS WITH CONTRAST 07/26/2024 07:03:01 PM TECHNIQUE: CT of the abdomen and pelvis was performed with the administration of 100 mL iohexol  (OMNIPAQUE ) 300 MG/ML solution. Multiplanar reformatted images are provided for review. Automated exposure control, iterative reconstruction, and/or weight-based adjustment of the mA/kV was utilized to reduce the radiation dose to as low as reasonably achievable. COMPARISON: 06/30/2019 CLINICAL HISTORY: abd pain \\T \ bloody stools, s/p lap band FINDINGS: LOWER CHEST: Scattered coronary calcifications. 6 mm pleural based nodule, lateral left lower lobe, previously 4 mm. 5 mm subpleural nodule, posterior right lower lobe (5:26) more conspicuous than on prior exam. According to the Fleischner Society pulmonary nodule recommendations, the finding is consistent with a solitary solid nodule 6-8 mm and the recommendation is: for low-risk patients, CT at 6-12 months, then consider CT at 18-24 months; for high-risk patients, CT at 6-12 months, then CT at 18-24 months. LIVER: The liver is unremarkable. GALLBLADDER AND BILE DUCTS: Gallbladder is unremarkable. No biliary ductal dilatation. SPLEEN: Scattered punctate calcified granulomas in the spleen, normal in size. PANCREAS: No acute abnormality. ADRENAL GLANDS: No acute abnormality. KIDNEYS, URETERS AND BLADDER: No stones in the kidneys or ureters. No hydronephrosis. No perinephric or periureteral stranding. Urinary bladder is unremarkable. Mild prostate enlargement. GI AND BOWEL: Stable gastric lap band apparatus. Stomach demonstrates no  acute abnormality. There is no bowel obstruction. PERITONEUM AND RETROPERITONEUM: No ascites. No free air. VASCULATURE: Aorta is normal in caliber. Moderate scattered calcified aortoiliac plaque without aneurysm. LYMPH NODES: Prominent central mesenteric lymph nodes, none greater than 1 cm short axis diameter. REPRODUCTIVE ORGANS: No acute abnormality. BONES AND SOFT TISSUES: Implantable stimulator device in the posterior right flank with catheters extending into the epidural space of the mid thoracic spine as before. Stable instrumented fusion L5-S1. Mild degenerative disc disease L4-L5 with vacuum phenomenon progressive since prior study. No acute osseous abnormality. IMPRESSION: 1. Stable gastric lap band apparatus. 2. No acute findings in the abdomen or pelvis. 3. Progressive adjacent level degenerative disc disease L4 L5. Electronically signed by: Katheleen Faes MD 07/26/2024 07:18 PM EST RP Workstation: [id number]    Microbiology: Results for orders placed or performed in visit on 03/07/21  Fecal occult blood, imunochemical([facility])     Status: None   Collection Time: 05/08/21 12:30 PM   Specimen: Stool   ST  Result Value Ref Range Status   Fecal Occult Bld Negative Negative Final    Labs: CBC: Recent Labs  Lab 08/11/24 1313 08/12/24 0343 08/12/24 2110 08/13/24 0401 08/14/24 0349  WBC 6.7 4.8 10.5 7.3 8.6  NEUTROABS 3.9  --   --   --   --   HGB 12.5* 10.8* 11.2* 11.2* 11.1*  HCT 38.7* 35.3* 35.9* 36.9* 36.5*  MCV 78.5* 81.7 80.9 82.2 82.8  PLT 342 282 327 248 306   Basic Metabolic Panel: Recent Labs  Lab 08/11/24 1313 08/12/24 0343 08/13/24 0401 08/14/24 0349  NA 135 140 139 142  K 4.1 4.4 4.1 4.1  CL 98 105 102 103  CO2 21* 24 21* 25  GLUCOSE 117* 86 144* 85  BUN 10 8  9 9  CREATININE 1.11 1.15 1.07 0.96  CALCIUM  9.7 9.3 9.2 9.4  MG  --   --  2.0  --    Liver Function Tests: Recent Labs  Lab 08/11/24 1313  AST 28  ALT 28  ALKPHOS 92  BILITOT 0.5   PROT 8.1  ALBUMIN 4.9   CBG: Recent Labs  Lab 08/13/24 0757 08/13/24 1117 08/13/24 2018 08/14/24 0802 08/14/24 1323  GLUCAP 112* 106* 223* 119* 99    Discharge time spent: {LESS THAN/GREATER THAN:26388} 30 minutes.  Signed: Zakyria Metzinger, DO [facility] 08/16/2024

## 2024-08-17 ENCOUNTER — Other Ambulatory Visit: Payer: Self-pay

## 2024-08-17 ENCOUNTER — Telehealth: Payer: Self-pay

## 2024-08-17 ENCOUNTER — Ambulatory Visit: Payer: Self-pay | Admitting: Gastroenterology

## 2024-08-17 ENCOUNTER — Encounter: Payer: Self-pay | Admitting: Pulmonary Disease

## 2024-08-17 LAB — SURGICAL PATHOLOGY

## 2024-08-17 NOTE — Telephone Encounter (Signed)
 Patient requesting f/u call in regards to upcoming apt. Please advise,

## 2024-08-17 NOTE — Transitions of Care (Post Inpatient/ED Visit) (Signed)
 08/17/2024  Name: Kevin [name] MRN: [id number] DOB: [birth year]  Today's TOC FU Call Status: Today's TOC FU Call Status:: Successful TOC FU Call Completed TOC FU Call Complete Date: 08/17/24  Patient's Name and Date of Birth confirmed. Name, DOB  Transition Care Management Follow-up Telephone Call Date of Discharge: 08/14/24 Discharge Facility: [name]) Type of Discharge: Inpatient Admission Primary Inpatient Discharge Diagnosis:: Upper GI Bleed.  Returned to ED 08/16/2024 with thrombophlebitis How have you been since you were released from the hospital?: Same (He said he is learning how to best manage his anemia) Any questions or concerns?: No  Items Reviewed: Did you receive and understand the discharge instructions provided?: Yes (He said he is not sure where he placed the instructions he received at discharge but was able to review his medications on My Chart. I reviewed the discharge instructions with him) Medications obtained,verified, and reconciled?: Yes (Medications Reviewed) (He has all medications and did not have any questions about the med regime.  He stated his glucometer is not working but he feels he is able to manage his blood sugars with his diet and how he is feeling.) Any new allergies since your discharge?: No Dietary orders reviewed?: Yes Type of Diet Ordered:: heart healthy, low sodium, diabetic Do you have support at home?: Yes People in Home [RPT]: significant other Name of Support/Comfort Primary Source: He stated that his significant other is extremely supportive  Medications Reviewed Today: Medications Reviewed Today     Reviewed by [name], RN (Case Manager) on 08/17/24 at 1126  Med List Status: <None>   Medication Order Taking? Sig Documenting Provider Last Dose Status Informant  amLODipine  (NORVASC ) 10 MG tablet [id number]  Take 1 tablet (10 mg total) by mouth daily. Theotis Haze ORN, NP  Active Self  ascorbic acid (VITAMIN C) 500 MG  tablet [id number]  Take 500-1,000 mg by mouth daily. [provider]  Active Self  atorvastatin  (LIPITOR) 40 MG tablet [id number]  Take 1 tablet (40 mg total) by mouth daily. Theotis Haze ORN, NP  Active Self  Cyanocobalamin (VITAMIN B12 PO) [id number]  Take 1 tablet by mouth daily. [provider]  Active Self  folic acid (FOLVITE) 800 MCG tablet [id number]  Take 800 mcg by mouth daily. [provider]  Active Self  glimepiride  (AMARYL ) 4 MG tablet [id number]  Take 1 tablet (4 mg total) by mouth daily with breakfast. Theotis Haze ORN, NP  Active Self  glucose blood (TRUE METRIX BLOOD GLUCOSE TEST) test strip [id number]  Use as instructed. Check blood glucose level by fingerstick twice per day. Theotis Haze ORN, NP  Active   JANUMET  50-1000 MG tablet [id number]  Take 1 tablet by mouth 2 (two) times daily with a meal.  Patient taking differently: Take 1 tablet by mouth See admin instructions. Take 20 mg by mouth with meals in the morning and evening   Fleming, Zelda W, NP  Active Self  Multiple Vitamin (MULTIVITAMIN WITH MINERALS) TABS tablet [id number]  Take 1 tablet by mouth daily. Swayze, Ava, DO  Active   omeprazole  (PRILOSEC) 20 MG capsule [id number]  Take 1 capsule (20 mg total) by mouth 2 (two) times daily before a meal.  Patient taking differently: Take 20 mg by mouth See admin instructions. Take 20 mg by mouth 30 minutes before meals in the morning and evening   Theotis Haze ORN, NP  Active Self  TRUEplus Lancets 28G MISC [id number]  Use as instructed. Check blood glucose level by fingerstick  twice per day. Theotis Haze ORN, NP  Active   valsartan  (DIOVAN ) 160 MG tablet [id number]  Take 1 tablet (160 mg total) by mouth daily. Theotis Haze ORN, NP  Active Self  Med List Note Steffi, Adelita, CPhT 07/30/24 1000): Patient states he receives from mail order             Home Care and Equipment/Supplies: Were Home Health Services Ordered?: No Any new equipment or  medical supplies ordered?: No  Functional Questionnaire: Do you need assistance with bathing/showering or dressing?: No (His significant other can assist as needed) Do you need assistance with meal preparation?: Yes (His significant other prepares meals) Do you need assistance with eating?: No Do you have difficulty maintaining continence: No Do you need assistance with getting out of bed/getting out of a chair/moving?: No Do you have difficulty managing or taking your medications?: No (His significant other assists with the med regime as needed)  Follow up appointments reviewed: PCP Follow-up appointment confirmed?: Yes Date of PCP follow-up appointment?: 11/20/24 Follow-up Provider: Haze Theotis, NP.  He did not want to shedule an appointment to be seen sooner and said he will call the clinic if he needs an earlier appointment Specialist Hospital Follow-up appointment confirmed?: Yes Date of Specialist follow-up appointment?: 08/31/24 Follow-Up Specialty Provider:: GI.  He has an appointment for iron  infusion 08/20/2024. Do you need transportation to your follow-up appointment?: No Do you understand care options if your condition(s) worsen?: Yes-patient verbalized understanding    SIGNATURE Slater Diesel, RN

## 2024-08-18 ENCOUNTER — Encounter: Admitting: Gastroenterology

## 2024-08-18 NOTE — Telephone Encounter (Signed)
 The pt wanted to ensure he has appt for capsule endo and not colon.  I did reassure him that us  the case.  He will keep appt as planned

## 2024-08-19 ENCOUNTER — Other Ambulatory Visit: Payer: Self-pay

## 2024-08-20 ENCOUNTER — Ambulatory Visit

## 2024-08-20 ENCOUNTER — Telehealth: Admitting: Physician Assistant

## 2024-08-20 VITALS — BP 156/88 | HR 82 | Temp 98.0°F | Resp 16 | Ht 67.0 in | Wt 184.8 lb

## 2024-08-20 DIAGNOSIS — D509 Iron deficiency anemia, unspecified: Secondary | ICD-10-CM | POA: Diagnosis not present

## 2024-08-20 DIAGNOSIS — K922 Gastrointestinal hemorrhage, unspecified: Secondary | ICD-10-CM | POA: Diagnosis not present

## 2024-08-20 DIAGNOSIS — R3989 Other symptoms and signs involving the genitourinary system: Secondary | ICD-10-CM

## 2024-08-20 MED ORDER — SODIUM CHLORIDE 0.9 % IV SOLN
510.0000 mg | Freq: Once | INTRAVENOUS | Status: AC
  Administered 2024-08-20: 510 mg via INTRAVENOUS
  Filled 2024-08-20: qty 17

## 2024-08-20 NOTE — Progress Notes (Signed)
 E-Visit for Urinary Problems  Based on what you shared with me, I feel your condition warrants further evaluation and I recommend that you be seen for a face to face office visit.  Male bladder infections are not very common.  We worry about prostate or kidney conditions.  The standard of care is to examine the abdomen and kidneys, and to do a urine and blood test to make sure that something more serious is not going on.  We recommend that you see a provider today.  If your doctor's office is closed [place] has the following Urgent Cares:   NOTE: There will be NO CHARGE for this E-Visit   If you are having a true medical emergency, please call 911.     For an urgent face to face visit, [facility] has multiple urgent care centers for your convenience.  Click the link below for the full list of locations and hours, walk-in wait times, appointment scheduling options and driving directions:  Urgent Care - [place], [place], [place], [place], [place], [place]       Your MyChart E-visit questionnaire answers were reviewed by a board certified advanced clinical practitioner to complete your personal care plan based on your specific symptoms.  Thank you for using e-Visits.

## 2024-08-20 NOTE — Progress Notes (Signed)
 Diagnosis: Iron  Deficiency Anemia  Provider:  Mannam, Praveen MD  Procedure: IV Infusion  IV Type: Peripheral, IV Location: L Antecubital  Feraheme (Ferumoxytol), Dose: 510 mg  Infusion Start Time: 1428  Infusion Stop Time: 1447  Post Infusion IV Care: Observation period completed and Peripheral IV Discontinued  Discharge: Condition: Good, Destination: Home . AVS Declined  Performed by:  Rocky FORBES Sar, RN

## 2024-08-21 ENCOUNTER — Ambulatory Visit: Admitting: Gastroenterology

## 2024-08-31 ENCOUNTER — Ambulatory Visit (INDEPENDENT_AMBULATORY_CARE_PROVIDER_SITE_OTHER): Admitting: Gastroenterology

## 2024-08-31 DIAGNOSIS — K31819 Angiodysplasia of stomach and duodenum without bleeding: Secondary | ICD-10-CM | POA: Diagnosis not present

## 2024-08-31 DIAGNOSIS — D509 Iron deficiency anemia, unspecified: Secondary | ICD-10-CM | POA: Diagnosis not present

## 2024-08-31 DIAGNOSIS — K3189 Other diseases of stomach and duodenum: Secondary | ICD-10-CM | POA: Diagnosis not present

## 2024-08-31 DIAGNOSIS — D500 Iron deficiency anemia secondary to blood loss (chronic): Secondary | ICD-10-CM

## 2024-08-31 NOTE — Patient Instructions (Signed)
POST CAPSULE INSTRUCTIONS:   Contact our office immediately at [phone] if you suffer from any abdominal pain, nausea, or vomiting during capsule endoscopy. Do not eat or drink for at least 2 hours. After 2 hours you may have any of the following to drink: Water                           White grape juice 7-Up                            Chicken Bouillon Sprite                           Ginger Ale  After 4 hours you may have a light snack to include any of the following: A cup of soup               sandwich Bowl of cereal             Rice Toast                           Eggs 2-3 small cookies (i.e. vanilla wafers or graham crackers)  Return to this office at 4:00 pm. After this you will be able to return to your normal diet.  During your procedure do not go near anyone else that is having capsule endoscopy.  Do not be in close contact with an MRI machine or a radio or television tower.   Do not wear a heavy coat or sweater because your recorder may over heat and stop recording.   Do not disconnect the equipment or remove the belt at any time.  Since the Data Recorder is actually a small computer, it should be treated with utmost care and protection.  Avoid sudden movement and banging of the Data Recorder.    Do not do any heavy lifting or strenuous physical activity during the test especially if it involves sweating.  Do not bend over or stoop during capsule endoscopy.  During capsule endoscopy, you will need to verify every 15 minutes that the small light on top of the Data Recorder is blinking twice per second.  If for some reason it stops blinking, record the time and contact our office at [phone].                     

## 2024-08-31 NOTE — Progress Notes (Signed)
 CAPSULE ID: [id number] Exp: 2026-01-10 LOT: [id number]  Patient arrived for capsule endoscopy. Reported the prep went well. Confirmed patient is fasting. Explained dietary restrictions for the next few hours. Patient verbalized understanding. Opened capsule, ensured capsule was flashing and transmitting to the recorder prior to the patient swallowing the capsule. Patient swallowed capsule without difficulty. Patient told to call the office with any questions. Understands to return to the office today between 4:00 and 4:30 pm. No further questions by the conclusion of the visit.

## 2024-09-11 ENCOUNTER — Ambulatory Visit: Admitting: Gastroenterology

## 2024-09-16 ENCOUNTER — Other Ambulatory Visit: Payer: Self-pay

## 2024-09-16 ENCOUNTER — Telehealth: Payer: Self-pay | Admitting: Gastroenterology

## 2024-09-16 DIAGNOSIS — K5520 Angiodysplasia of colon without hemorrhage: Secondary | ICD-10-CM

## 2024-09-16 DIAGNOSIS — D500 Iron deficiency anemia secondary to blood loss (chronic): Secondary | ICD-10-CM

## 2024-09-16 DIAGNOSIS — D508 Other iron deficiency anemias: Secondary | ICD-10-CM

## 2024-09-16 DIAGNOSIS — K921 Melena: Secondary | ICD-10-CM

## 2024-09-16 NOTE — Addendum Note (Signed)
 Addended by: LEGRAND VICTORY CROME on: 09/16/2024 04:38 PM   Modules accepted: Level of Service

## 2024-09-16 NOTE — Telephone Encounter (Signed)
 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 (He had a small AVM ablated on the EGD as well)  Looks like blood counts are coming up at last check a month ago  Needs CBC and iron  studies in the next 2 weeks  Needs clinic follow-up with pod C APP next available  VEAR Brand MD

## 2024-09-17 NOTE — Telephone Encounter (Signed)
 The patient has been notified of this information and all questions answered.   The pt will come in for labs in 2 weeks and keep appt  made for 1/15 with Delon Failing

## 2024-09-20 ENCOUNTER — Other Ambulatory Visit: Payer: Self-pay

## 2024-09-21 ENCOUNTER — Other Ambulatory Visit: Payer: Self-pay

## 2024-09-21 ENCOUNTER — Other Ambulatory Visit: Payer: Self-pay | Admitting: Nurse Practitioner

## 2024-09-21 ENCOUNTER — Encounter: Payer: Self-pay | Admitting: Gastroenterology

## 2024-09-21 DIAGNOSIS — E119 Type 2 diabetes mellitus without complications: Secondary | ICD-10-CM

## 2024-09-24 ENCOUNTER — Ambulatory Visit: Admitting: Physician Assistant

## 2024-09-24 ENCOUNTER — Other Ambulatory Visit

## 2024-09-24 ENCOUNTER — Encounter: Payer: Self-pay | Admitting: Physician Assistant

## 2024-09-24 VITALS — BP 130/74 | HR 92 | Ht 66.0 in | Wt 199.2 lb

## 2024-09-24 DIAGNOSIS — K5520 Angiodysplasia of colon without hemorrhage: Secondary | ICD-10-CM | POA: Diagnosis not present

## 2024-09-24 DIAGNOSIS — D500 Iron deficiency anemia secondary to blood loss (chronic): Secondary | ICD-10-CM

## 2024-09-24 DIAGNOSIS — K922 Gastrointestinal hemorrhage, unspecified: Secondary | ICD-10-CM

## 2024-09-24 DIAGNOSIS — K219 Gastro-esophageal reflux disease without esophagitis: Secondary | ICD-10-CM

## 2024-09-24 DIAGNOSIS — D508 Other iron deficiency anemias: Secondary | ICD-10-CM

## 2024-09-24 DIAGNOSIS — K921 Melena: Secondary | ICD-10-CM | POA: Diagnosis not present

## 2024-09-24 DIAGNOSIS — D509 Iron deficiency anemia, unspecified: Secondary | ICD-10-CM | POA: Diagnosis not present

## 2024-09-24 LAB — CBC WITH DIFFERENTIAL/PLATELET
Basophils Absolute: 0.1 K/uL (ref 0.0–0.1)
Basophils Relative: 1.1 % (ref 0.0–3.0)
Eosinophils Absolute: 0.2 K/uL (ref 0.0–0.7)
Eosinophils Relative: 2.6 % (ref 0.0–5.0)
HCT: 40.7 % (ref 39.0–52.0)
Hemoglobin: 14.1 g/dL (ref 13.0–17.0)
Lymphocytes Relative: 21.9 % (ref 12.0–46.0)
Lymphs Abs: 1.6 K/uL (ref 0.7–4.0)
MCHC: 34.6 g/dL (ref 30.0–36.0)
MCV: 84.7 fl (ref 78.0–100.0)
Monocytes Absolute: 0.5 K/uL (ref 0.1–1.0)
Monocytes Relative: 7.7 % (ref 3.0–12.0)
Neutro Abs: 4.7 K/uL (ref 1.4–7.7)
Neutrophils Relative %: 66.7 % (ref 43.0–77.0)
Platelets: 239 K/uL (ref 150.0–400.0)
RBC: 4.81 Mil/uL (ref 4.22–5.81)
RDW: 22.7 % — ABNORMAL HIGH (ref 11.5–15.5)
WBC: 7.1 K/uL (ref 4.0–10.5)

## 2024-09-24 LAB — IBC + FERRITIN
Ferritin: 77.4 ng/mL (ref 22.0–322.0)
Iron: 102 ug/dL (ref 42–165)
Saturation Ratios: 26 % (ref 20.0–50.0)
TIBC: 392 ug/dL (ref 250.0–450.0)
Transferrin: 280 mg/dL (ref 212.0–360.0)

## 2024-09-24 NOTE — Patient Instructions (Signed)
 Your provider has requested that you go to the basement level for lab work before leaving today. Press B on the elevator. The lab is located at the first door on the left as you exit the elevator.  Referral placed to hematology.   _______________________________________________________  If your blood pressure at your visit was 140/90 or greater, please contact your primary care physician to follow up on this.  _______________________________________________________  If you are age 59 or older, your body mass index should be between 23-30. Your Body mass index is 32.16 kg/m. If this is out of the aforementioned range listed, please consider follow up with your Primary Care Provider.  If you are age 32 or younger, your body mass index should be between 19-25. Your Body mass index is 32.16 kg/m. If this is out of the aformentioned range listed, please consider follow up with your Primary Care Provider.   ________________________________________________________  The [facility] providers would like to encourage you to use MYCHART to communicate with providers for non-urgent requests or questions.  Due to long hold times on the telephone, sending your provider a message by [facility] may be a faster and more efficient way to get a response.  Please allow 48 business hours for a response.  Please remember that this is for non-urgent requests.  _______________________________________________________  [facility] is using a team-based approach to care.  Your team is made up of your doctor and two to three APPS. Our APPS (Nurse Practitioners and Physician Assistants) work with your physician to ensure care continuity for you. They are fully qualified to address your health concerns and develop a treatment plan. They communicate directly with your gastroenterologist to care for you. Seeing the Advanced Practice Practitioners on your physician's team can help you by facilitating care more  promptly, often allowing for earlier appointments, access to diagnostic testing, procedures, and other specialty referrals.

## 2024-09-24 NOTE — Progress Notes (Signed)
 "  Chief Complaint: History of GI bleeding and anemia  HPI:    Mr. Kevin [name] is a 59 year old male, known to Dr. Legrand, with a past medical history as listed below including diabetes and GERD, who presents to clinic today for follow-up of his history of GI bleeding and anemia.    2010 colonoscopy with prepolyp lesions (records not available).  From Dr. Clayburn last note: History of lap band placed decades ago Prior GI evaluations outside this health system, no records currently available. November 2025 saw bariatric surgery (Dr. Tanda, note on file), outlined difficulties patient has had related to the Lap-Band as well as imaging findings plans to remove it. While no reported proximal gastric/esophageal obstructive findings on the CT abdomen report, Dr. Luigi office note indicates that he reviewed the images and felt that there was dilatation of the proximal stomach as well as the esophagus. History of IDA as well.       07/30/2024 CBC with a hemoglobin of 9.9, MCV low at 79.8.    08/04/2024 office visit with Dr. Legrand.  At that time being seen after an ER visit on 07/30/2024 for escalating abdominal pain, loss of appetite, dark stool FOBT negative.  Noted to have abdominal pain intermittent since earlier that year.  Altered bowel movements.  Symptoms of anemia with shortness of breath difficulty walking.  History of lap band in 2006.  Patient suffering with some anxiety that day.  Labs did show iron  deficiency.  Recommended EGD and colonoscopy.    08/04/2024 iron  studies with a ferritin low at 9.6, percent saturation low at 13.    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  Patient started on PPI 40 mg daily.    08/14/2024 colonoscopy with hemorrhoids,  5 to-6 mm polyps at the rectosigmoid colon, transverse colon, hepatic flexure and cecum, nonbleeding nonthrombosed internal hemorrhoids.  Recommended to restart p.o. iron .  Recommended VCE.    08/31/24 capsule endoscopy showed 1 small erosion at the 17-minute mark and 1 tiny nonbleeding AVM at 2 hours and 36-minute mark.  Discussed that AVMs may be potential etiology for IDA.  Recommend continue PPI and follow-up CBC and iron  study in 2 months.  Discussed the use of AI scribe software for clinical note transcription with the patient, who gave verbal consent to proceed.  History of Present Illness Kevin [name] is a 59 year old male with iron  deficiency anemia who presents for evaluation of persistent rectal bleeding.  Rectal bleeding has persisted for approximately one year, characterized by streaks of blood. There was partial improvement in the presence of bright red blood following a prior colonoscopy. Streaks of blood have continued despite this intervention.  (Patient shows me a picture of mostly brown stool with 2 spots of red tingeing)  Following capsule endoscopy, he noted an absence of bleeding for three days. He is able to sense the onset of bleeding, describing associated abdominal grumbling and increased gas.  Over the past few weeks, bleeding has increased, with a notable rise about one week ago that has remained elevated.  No rectal pain or constipation.  He experiences symptoms of anemia, though less severe than previous episodes when he was passing out. He reports overall improvement in health since his last hospitalization.  Denies fever,  chills or weight loss.    Past Medical History:  Diagnosis Date   COVID 04/20/2020   Diabetes mellitus without complication (HCC)    GERD (gastroesophageal reflux disease)    Hypertension    MRSA (methicillin resistant Staphylococcus aureus)     Past Surgical History:  Procedure Laterality Date   BACK SURGERY     COLONOSCOPY N/A  08/13/2024   Procedure: COLONOSCOPY;  Surgeon: Mansouraty, Aloha Raddle., MD;  Location: [facility];  Service: Gastroenterology;  Laterality: N/A;   ESOPHAGOGASTRODUODENOSCOPY N/A 08/13/2024   Procedure: EGD (ESOPHAGOGASTRODUODENOSCOPY);  Surgeon: Wilhelmenia Aloha Raddle., MD;  Location: [facility];  Service: Gastroenterology;  Laterality: N/A;   LAPAROSCOPIC GASTRIC BANDING     TRIGGER FINGER RELEASE Right 10/05/2020   Procedure: RELEASE TRIGGER FINGER RIGHT LONG FINGER;  Surgeon: Jerri Kay HERO, MD;  Location: [facility];  Service: Orthopedics;  Laterality: Right;    Current Outpatient Medications  Medication Sig Dispense Refill   amLODipine  (NORVASC ) 10 MG tablet Take 1 tablet (10 mg total) by mouth daily. 90 tablet 2   ascorbic acid (VITAMIN C) 500 MG tablet Take 500-1,000 mg by mouth daily.     atorvastatin  (LIPITOR) 40 MG tablet Take 1 tablet (40 mg total) by mouth daily. 90 tablet 2   Cyanocobalamin (VITAMIN B12 PO) Take 1 tablet by mouth daily.     folic acid (FOLVITE) 800 MCG tablet Take 800 mcg by mouth daily.     glimepiride  (AMARYL ) 4 MG tablet Take 1 tablet (4 mg total) by mouth daily with breakfast. 90 tablet 2   glucose blood (TRUE METRIX BLOOD GLUCOSE TEST) test strip Use as instructed. Check blood glucose level by fingerstick twice per day. 200 each 12   JANUMET  50-1000 MG tablet TAKE ONE TABLET BY MOUTH TWICE DAILY WITH MEALS 180 tablet 2   Multiple Vitamin (MULTIVITAMIN WITH MINERALS) TABS tablet Take 1 tablet by mouth daily. 30 tablet 0   omeprazole  (PRILOSEC) 20 MG capsule Take 1 capsule (20 mg total) by mouth 2 (two) times daily before a meal. (Patient not taking: Reported on 08/17/2024) 180 capsule 1   TRUEplus Lancets 28G MISC Use as instructed. Check blood glucose level by fingerstick twice per day. 200 each 6   valsartan  (DIOVAN ) 160 MG tablet Take 1 tablet (160 mg total) by mouth daily. 90 tablet 2   No current facility-administered medications for this  visit.    Allergies as of 09/24/2024 - Review Complete 08/13/2024  Allergen Reaction Noted   Nsaids Other (See Comments) 07/30/2024   Tramadol  Other (See Comments) 09/21/2020    Family History  Problem Relation Age of Onset   Heart disease Mother    Hypertension Mother    Diabetes Sister    Hypertension Sister    Stroke Brother    Diabetes Brother    Heart disease Brother    Heart attack Brother    Hypertension Brother    Hypertension Father     Social History   Socioeconomic History   Marital status: Divorced    Spouse name: Not on file   Number of children: Not on file   Years of education: Not on file   Highest education level: Not on file  Occupational History   Not on file  Tobacco Use   Smoking status: Former    Current packs/day: 0.00    Types: Cigarettes    Start date: 59    Quit date: 2000    Years since quitting: 26.0  Smokeless tobacco: Never  Vaping Use   Vaping status: Never Used  Substance and Sexual Activity   Alcohol  use: No   Drug use: No   Sexual activity: Yes  Other Topics Concern   Not on file  Social History Narrative   Not on file   Social Drivers of Health   Tobacco Use: Medium Risk (08/16/2024)   Patient History    Smoking Tobacco Use: Former    Smokeless Tobacco Use: Never    Passive Exposure: Not on file  Financial Resource Strain: Low Risk (07/22/2023)   Overall Financial Resource Strain (CARDIA)    Difficulty of Paying Living Expenses: Not hard at all  Food Insecurity: No Food Insecurity (08/11/2024)   Epic    Worried About Radiation Protection Practitioner of Food in the Last Year: Never true    Ran Out of Food in the Last Year: Never true  Transportation Needs: No Transportation Needs (08/11/2024)   Epic    Lack of Transportation (Medical): No    Lack of Transportation (Non-Medical): No  Physical Activity: Inactive (07/22/2023)   Exercise Vital Sign    Days of Exercise per Week: 0 days    Minutes of Exercise per Session: 0 min  Stress:  No Stress Concern Present (07/22/2023)   [employer] of Occupational Health - Occupational Stress Questionnaire    Feeling of Stress : Not at all  Social Connections: Socially Isolated (07/22/2023)   Social Connection and Isolation Panel    Frequency of Communication with Friends and Family: Twice a week    Frequency of Social Gatherings with Friends and Family: More than three times a week    Attends Religious Services: Never    Database Administrator or Organizations: No    Attends Banker Meetings: Never    Marital Status: Divorced  Catering Manager Violence: Not At Risk (08/11/2024)   Epic    Fear of Current or Ex-Partner: No    Emotionally Abused: No    Physically Abused: No    Sexually Abused: No  Depression (PHQ2-9): High Risk (07/28/2024)   Depression (PHQ2-9)    PHQ-2 Score: 15  Alcohol  Screen: Not on file  Housing: Low Risk (08/11/2024)   Epic    Unable to Pay for Housing in the Last Year: No    Number of Times Moved in the Last Year: 0    Homeless in the Last Year: No  Utilities: Not At Risk (08/11/2024)   Epic    Threatened with loss of utilities: No  Health Literacy: Adequate Health Literacy (07/22/2023)   B1300 Health Literacy    Frequency of need for help with medical instructions: Never    Review of Systems:    Constitutional: No weight loss, fever or chills Cardiovascular: No chest pain Respiratory: No SOB Gastrointestinal: See HPI and otherwise negative   Physical Exam:  Vital signs: BP 130/74 (BP Location: Left Arm, Patient Position: Sitting, Cuff Size: Normal)   Pulse 92   Ht 5' 6 (1.676 m) Comment: height measured without shoes  Wt 199 lb 4 oz (90.4 kg)   BMI 32.16 kg/m    Constitutional:   Pleasant Caucasian male appears to be in NAD, Well developed, Well nourished, alert and cooperative Respiratory: Respirations even and unlabored. Lungs clear to auscultation bilaterally.   No wheezes, crackles, or rhonchi.  Cardiovascular:  Normal S1, S2. No MRG. Regular rate and rhythm. No peripheral edema, cyanosis or pallor.  Gastrointestinal:  Soft, nondistended, nontender. No rebound or guarding.  Normal bowel sounds. No appreciable masses or hepatomegaly. Rectal:  Not performed.  Psychiatric: Demonstrates good judgement and reason without abnormal affect or behaviors.  MOST RECENT LABS: CBC    Component Value Date/Time   WBC 9.2 08/16/2024 1135   RBC 4.73 08/16/2024 1135   HGB 11.9 (L) 08/16/2024 1135   HGB 10.8 (L) 05/22/2024 0917   HCT 38.6 (L) 08/16/2024 1135   HCT 35.5 (L) 05/22/2024 0917   PLT 317 08/16/2024 1135   PLT 302 05/22/2024 0917   MCV 81.6 08/16/2024 1135   MCV 82 05/22/2024 0917   MCH 25.2 (L) 08/16/2024 1135   MCHC 30.8 08/16/2024 1135   RDW 19.0 (H) 08/16/2024 1135   RDW 14.2 05/22/2024 0917   LYMPHSABS 1.4 08/16/2024 1135   LYMPHSABS 1.1 05/22/2024 0917   MONOABS 0.6 08/16/2024 1135   EOSABS 0.1 08/16/2024 1135   EOSABS 0.2 05/22/2024 0917   BASOSABS 0.1 08/16/2024 1135   BASOSABS 0.1 05/22/2024 0917    CMP     Component Value Date/Time   NA 141 08/16/2024 1135   NA 139 05/22/2024 0917   K 3.9 08/16/2024 1135   CL 103 08/16/2024 1135   CO2 26 08/16/2024 1135   GLUCOSE 155 (H) 08/16/2024 1135   BUN 7 08/16/2024 1135   BUN 12 05/22/2024 0917   CREATININE 0.95 08/16/2024 1135   CALCIUM  9.6 08/16/2024 1135   PROT 8.1 08/11/2024 1313   PROT 7.7 05/22/2024 0917   ALBUMIN 4.9 08/11/2024 1313   ALBUMIN 4.8 05/22/2024 0917   AST 28 08/11/2024 1313   ALT 28 08/11/2024 1313   ALKPHOS 92 08/11/2024 1313   BILITOT 0.5 08/11/2024 1313   BILITOT 0.5 05/22/2024 0917   GFRNONAA >60 08/16/2024 1135   GFRAA  07/30/2024 9191    PATIENT IDENTIFICATION ERROR. PLEASE DISREGARD RESULTS. ACCOUNT WILL BE CREDITED.    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 - CBC and iron  studies with ferritin today. - Had referred patient to hematology so they can keep a close eye on his anemia and give iron  infusions/blood transfusions when necessary - Pending course over the next 6 months to year could consider octreotide  Iron  deficiency anemia Chronic iron  deficiency anemia likely secondary to gastrointestinal blood loss, currently less severe than prior episodes. - Labs above today  GERD Continue PPI  Patient to follow in clinic per recommendations after labs today.   Delon Failing, PA-C [facility] 09/24/2024, 12:41 PM  Cc: Theotis Haze ORN, NP  "

## 2024-09-29 ENCOUNTER — Ambulatory Visit: Payer: Self-pay | Admitting: Gastroenterology

## 2024-09-29 NOTE — Progress Notes (Signed)
 Patient is scheduled on 10/30/24 @ 9:30am w/Sara Arletta, PA-C

## 2024-10-02 NOTE — Progress Notes (Signed)
 ____________________________________________________________  Attending physician addendum:  Thank you for sending this case to me. I have reviewed the entire note and agree with the plan.   Victory Brand, MD  ____________________________________________________________

## 2024-10-12 ENCOUNTER — Other Ambulatory Visit (HOSPITAL_COMMUNITY): Payer: Self-pay

## 2024-10-12 ENCOUNTER — Other Ambulatory Visit: Payer: Self-pay

## 2024-10-12 ENCOUNTER — Other Ambulatory Visit (HOSPITAL_BASED_OUTPATIENT_CLINIC_OR_DEPARTMENT_OTHER): Payer: Self-pay

## 2024-10-28 ENCOUNTER — Inpatient Hospital Stay

## 2024-10-28 ENCOUNTER — Inpatient Hospital Stay: Admitting: Oncology

## 2024-10-30 ENCOUNTER — Ambulatory Visit: Admitting: Gastroenterology

## 2024-11-20 ENCOUNTER — Ambulatory Visit: Admitting: Nurse Practitioner

## 2024-12-14 ENCOUNTER — Ambulatory Visit (INDEPENDENT_AMBULATORY_CARE_PROVIDER_SITE_OTHER): Admitting: Physician Assistant
